# Patient Record
Sex: Female | Born: 1955 | Race: Black or African American | Hispanic: No | Smoking: Never smoker
Health system: Southern US, Community
[De-identification: ages and names within clinical notes are randomized; demographics above are authoritative.]

## PROBLEM LIST (undated history)

## (undated) DIAGNOSIS — IMO0002 Reserved for concepts with insufficient information to code with codable children: Secondary | ICD-10-CM

## (undated) DIAGNOSIS — M1990 Unspecified osteoarthritis, unspecified site: Secondary | ICD-10-CM

## (undated) DIAGNOSIS — M67439 Ganglion, unspecified wrist: Secondary | ICD-10-CM

## (undated) DIAGNOSIS — I10 Essential (primary) hypertension: Secondary | ICD-10-CM

## (undated) HISTORY — DX: Essential (primary) hypertension: I10

## (undated) HISTORY — PX: ABDOMINAL HYSTERECTOMY: SHX81

## (undated) HISTORY — DX: Unspecified osteoarthritis, unspecified site: M19.90

## (undated) HISTORY — DX: Ganglion, unspecified wrist: M67.439

## (undated) HISTORY — DX: Reserved for concepts with insufficient information to code with codable children: IMO0002

---

## 1997-08-14 ENCOUNTER — Emergency Department (HOSPITAL_COMMUNITY): Admission: EM | Admit: 1997-08-14 | Discharge: 1997-08-14 | Payer: Self-pay | Admitting: Emergency Medicine

## 1997-08-21 ENCOUNTER — Emergency Department (HOSPITAL_COMMUNITY): Admission: EM | Admit: 1997-08-21 | Discharge: 1997-08-21 | Payer: Self-pay | Admitting: Emergency Medicine

## 1997-10-05 ENCOUNTER — Encounter: Admission: RE | Admit: 1997-10-05 | Discharge: 1997-10-05 | Payer: Self-pay | Admitting: Family Medicine

## 1998-03-04 ENCOUNTER — Encounter: Admission: RE | Admit: 1998-03-04 | Discharge: 1998-03-04 | Payer: Self-pay | Admitting: Family Medicine

## 1998-03-29 ENCOUNTER — Encounter: Admission: RE | Admit: 1998-03-29 | Discharge: 1998-03-29 | Payer: Self-pay | Admitting: Family Medicine

## 1998-04-12 ENCOUNTER — Encounter: Admission: RE | Admit: 1998-04-12 | Discharge: 1998-04-12 | Payer: Self-pay | Admitting: Family Medicine

## 1998-07-26 ENCOUNTER — Encounter: Admission: RE | Admit: 1998-07-26 | Discharge: 1998-07-26 | Payer: Self-pay | Admitting: Sports Medicine

## 1998-10-25 ENCOUNTER — Encounter: Admission: RE | Admit: 1998-10-25 | Discharge: 1998-10-25 | Payer: Self-pay | Admitting: Family Medicine

## 1999-04-08 ENCOUNTER — Emergency Department (HOSPITAL_COMMUNITY): Admission: EM | Admit: 1999-04-08 | Discharge: 1999-04-08 | Payer: Self-pay | Admitting: Emergency Medicine

## 1999-05-27 ENCOUNTER — Encounter: Admission: RE | Admit: 1999-05-27 | Discharge: 1999-05-27 | Payer: Self-pay | Admitting: Family Medicine

## 1999-06-16 ENCOUNTER — Encounter (INDEPENDENT_AMBULATORY_CARE_PROVIDER_SITE_OTHER): Payer: Self-pay | Admitting: *Deleted

## 1999-06-16 LAB — CONVERTED CEMR LAB

## 1999-06-20 ENCOUNTER — Emergency Department (HOSPITAL_COMMUNITY): Admission: EM | Admit: 1999-06-20 | Discharge: 1999-06-20 | Payer: Self-pay | Admitting: Emergency Medicine

## 1999-06-20 ENCOUNTER — Encounter: Payer: Self-pay | Admitting: Emergency Medicine

## 1999-06-24 ENCOUNTER — Encounter: Admission: RE | Admit: 1999-06-24 | Discharge: 1999-06-24 | Payer: Self-pay | Admitting: Family Medicine

## 1999-07-08 ENCOUNTER — Encounter: Admission: RE | Admit: 1999-07-08 | Discharge: 1999-10-05 | Payer: Self-pay | Admitting: Family Medicine

## 1999-07-22 ENCOUNTER — Encounter: Payer: Self-pay | Admitting: Sports Medicine

## 1999-07-22 ENCOUNTER — Encounter: Admission: RE | Admit: 1999-07-22 | Discharge: 1999-07-22 | Payer: Self-pay | Admitting: Sports Medicine

## 1999-07-29 ENCOUNTER — Other Ambulatory Visit: Admission: RE | Admit: 1999-07-29 | Discharge: 1999-07-29 | Payer: Self-pay | Admitting: Obstetrics and Gynecology

## 1999-07-29 ENCOUNTER — Encounter (INDEPENDENT_AMBULATORY_CARE_PROVIDER_SITE_OTHER): Payer: Self-pay

## 1999-08-12 ENCOUNTER — Encounter: Admission: RE | Admit: 1999-08-12 | Discharge: 1999-08-12 | Payer: Self-pay | Admitting: Family Medicine

## 1999-08-12 ENCOUNTER — Ambulatory Visit (HOSPITAL_COMMUNITY): Admission: RE | Admit: 1999-08-12 | Discharge: 1999-08-12 | Payer: Self-pay | Admitting: Obstetrics and Gynecology

## 1999-08-12 ENCOUNTER — Encounter: Payer: Self-pay | Admitting: Obstetrics and Gynecology

## 1999-08-19 ENCOUNTER — Encounter: Admission: RE | Admit: 1999-08-19 | Discharge: 1999-08-19 | Payer: Self-pay | Admitting: Family Medicine

## 1999-09-05 ENCOUNTER — Inpatient Hospital Stay (HOSPITAL_COMMUNITY): Admission: EM | Admit: 1999-09-05 | Discharge: 1999-09-05 | Payer: Self-pay | Admitting: Obstetrics and Gynecology

## 1999-09-05 ENCOUNTER — Encounter: Admission: RE | Admit: 1999-09-05 | Discharge: 1999-09-05 | Payer: Self-pay | Admitting: Family Medicine

## 1999-10-04 ENCOUNTER — Inpatient Hospital Stay (HOSPITAL_COMMUNITY): Admission: AD | Admit: 1999-10-04 | Discharge: 1999-10-04 | Payer: Self-pay | Admitting: *Deleted

## 1999-10-14 ENCOUNTER — Encounter: Admission: RE | Admit: 1999-10-14 | Discharge: 1999-10-14 | Payer: Self-pay | Admitting: Family Medicine

## 1999-10-31 ENCOUNTER — Encounter: Payer: Self-pay | Admitting: Obstetrics and Gynecology

## 1999-11-03 ENCOUNTER — Inpatient Hospital Stay (HOSPITAL_COMMUNITY): Admission: RE | Admit: 1999-11-03 | Discharge: 1999-11-05 | Payer: Self-pay | Admitting: Obstetrics and Gynecology

## 1999-11-03 ENCOUNTER — Encounter (INDEPENDENT_AMBULATORY_CARE_PROVIDER_SITE_OTHER): Payer: Self-pay | Admitting: Specialist

## 1999-12-30 ENCOUNTER — Encounter: Admission: RE | Admit: 1999-12-30 | Discharge: 1999-12-30 | Payer: Self-pay | Admitting: Family Medicine

## 2000-07-06 ENCOUNTER — Encounter: Admission: RE | Admit: 2000-07-06 | Discharge: 2000-07-06 | Payer: Self-pay | Admitting: Family Medicine

## 2000-07-27 ENCOUNTER — Encounter: Admission: RE | Admit: 2000-07-27 | Discharge: 2000-07-27 | Payer: Self-pay | Admitting: *Deleted

## 2000-07-27 ENCOUNTER — Encounter: Payer: Self-pay | Admitting: *Deleted

## 2000-08-03 ENCOUNTER — Encounter: Admission: RE | Admit: 2000-08-03 | Discharge: 2000-08-03 | Payer: Self-pay | Admitting: *Deleted

## 2000-08-03 ENCOUNTER — Encounter: Payer: Self-pay | Admitting: *Deleted

## 2000-09-19 ENCOUNTER — Emergency Department (HOSPITAL_COMMUNITY): Admission: EM | Admit: 2000-09-19 | Discharge: 2000-09-19 | Payer: Self-pay | Admitting: Emergency Medicine

## 2000-09-21 ENCOUNTER — Encounter: Admission: RE | Admit: 2000-09-21 | Discharge: 2000-09-21 | Payer: Self-pay | Admitting: Family Medicine

## 2000-10-19 ENCOUNTER — Encounter: Admission: RE | Admit: 2000-10-19 | Discharge: 2000-10-19 | Payer: Self-pay | Admitting: Family Medicine

## 2000-12-05 ENCOUNTER — Encounter: Admission: RE | Admit: 2000-12-05 | Discharge: 2000-12-05 | Payer: Self-pay | Admitting: Family Medicine

## 2000-12-20 ENCOUNTER — Other Ambulatory Visit: Admission: RE | Admit: 2000-12-20 | Discharge: 2000-12-20 | Payer: Self-pay | Admitting: Obstetrics and Gynecology

## 2001-01-04 ENCOUNTER — Encounter: Admission: RE | Admit: 2001-01-04 | Discharge: 2001-01-04 | Payer: Self-pay | Admitting: Family Medicine

## 2001-05-11 ENCOUNTER — Emergency Department (HOSPITAL_COMMUNITY): Admission: EM | Admit: 2001-05-11 | Discharge: 2001-05-11 | Payer: Self-pay | Admitting: *Deleted

## 2001-05-31 ENCOUNTER — Encounter: Admission: RE | Admit: 2001-05-31 | Discharge: 2001-05-31 | Payer: Self-pay | Admitting: Family Medicine

## 2001-08-02 ENCOUNTER — Encounter: Admission: RE | Admit: 2001-08-02 | Discharge: 2001-08-02 | Payer: Self-pay | Admitting: *Deleted

## 2001-08-02 ENCOUNTER — Encounter: Payer: Self-pay | Admitting: *Deleted

## 2001-09-23 ENCOUNTER — Encounter: Admission: RE | Admit: 2001-09-23 | Discharge: 2001-09-23 | Payer: Self-pay | Admitting: Family Medicine

## 2001-10-24 ENCOUNTER — Encounter: Admission: RE | Admit: 2001-10-24 | Discharge: 2001-10-24 | Payer: Self-pay | Admitting: Family Medicine

## 2002-02-14 ENCOUNTER — Other Ambulatory Visit: Admission: RE | Admit: 2002-02-14 | Discharge: 2002-02-14 | Payer: Self-pay | Admitting: Obstetrics and Gynecology

## 2002-02-20 ENCOUNTER — Encounter: Admission: RE | Admit: 2002-02-20 | Discharge: 2002-02-20 | Payer: Self-pay | Admitting: Family Medicine

## 2002-04-15 ENCOUNTER — Ambulatory Visit (HOSPITAL_BASED_OUTPATIENT_CLINIC_OR_DEPARTMENT_OTHER): Admission: RE | Admit: 2002-04-15 | Discharge: 2002-04-15 | Payer: Self-pay | Admitting: General Surgery

## 2002-04-15 ENCOUNTER — Encounter (INDEPENDENT_AMBULATORY_CARE_PROVIDER_SITE_OTHER): Payer: Self-pay | Admitting: *Deleted

## 2002-06-18 ENCOUNTER — Encounter: Admission: RE | Admit: 2002-06-18 | Discharge: 2002-06-18 | Payer: Self-pay | Admitting: Family Medicine

## 2002-07-07 ENCOUNTER — Ambulatory Visit (HOSPITAL_COMMUNITY): Admission: RE | Admit: 2002-07-07 | Discharge: 2002-07-07 | Payer: Self-pay | Admitting: Family Medicine

## 2002-07-07 ENCOUNTER — Encounter: Admission: RE | Admit: 2002-07-07 | Discharge: 2002-07-07 | Payer: Self-pay | Admitting: Family Medicine

## 2002-07-18 ENCOUNTER — Encounter: Payer: Self-pay | Admitting: Family Medicine

## 2002-07-18 ENCOUNTER — Encounter: Admission: RE | Admit: 2002-07-18 | Discharge: 2002-07-18 | Payer: Self-pay | Admitting: Family Medicine

## 2002-08-08 ENCOUNTER — Ambulatory Visit (HOSPITAL_COMMUNITY): Admission: RE | Admit: 2002-08-08 | Discharge: 2002-08-08 | Payer: Self-pay | Admitting: Family Medicine

## 2002-08-08 ENCOUNTER — Encounter: Payer: Self-pay | Admitting: Family Medicine

## 2002-09-29 ENCOUNTER — Encounter: Admission: RE | Admit: 2002-09-29 | Discharge: 2002-09-29 | Payer: Self-pay | Admitting: Family Medicine

## 2003-02-16 ENCOUNTER — Encounter: Admission: RE | Admit: 2003-02-16 | Discharge: 2003-02-16 | Payer: Self-pay | Admitting: Family Medicine

## 2003-02-20 ENCOUNTER — Encounter: Admission: RE | Admit: 2003-02-20 | Discharge: 2003-02-20 | Payer: Self-pay | Admitting: Family Medicine

## 2003-03-11 ENCOUNTER — Encounter: Admission: RE | Admit: 2003-03-11 | Discharge: 2003-03-11 | Payer: Self-pay | Admitting: Family Medicine

## 2003-03-27 ENCOUNTER — Other Ambulatory Visit: Admission: RE | Admit: 2003-03-27 | Discharge: 2003-03-27 | Payer: Self-pay | Admitting: Obstetrics and Gynecology

## 2003-07-23 ENCOUNTER — Encounter: Admission: RE | Admit: 2003-07-23 | Discharge: 2003-07-23 | Payer: Self-pay | Admitting: Sports Medicine

## 2003-08-14 ENCOUNTER — Ambulatory Visit (HOSPITAL_COMMUNITY): Admission: RE | Admit: 2003-08-14 | Discharge: 2003-08-14 | Payer: Self-pay | Admitting: Family Medicine

## 2003-08-28 ENCOUNTER — Emergency Department (HOSPITAL_COMMUNITY): Admission: EM | Admit: 2003-08-28 | Discharge: 2003-08-28 | Payer: Self-pay | Admitting: Family Medicine

## 2003-08-28 ENCOUNTER — Ambulatory Visit (HOSPITAL_COMMUNITY): Admission: RE | Admit: 2003-08-28 | Discharge: 2003-08-28 | Payer: Self-pay | Admitting: Family Medicine

## 2003-09-01 ENCOUNTER — Emergency Department (HOSPITAL_COMMUNITY): Admission: EM | Admit: 2003-09-01 | Discharge: 2003-09-01 | Payer: Self-pay | Admitting: Family Medicine

## 2003-10-12 ENCOUNTER — Encounter: Admission: RE | Admit: 2003-10-12 | Discharge: 2003-10-12 | Payer: Self-pay | Admitting: Family Medicine

## 2003-11-10 ENCOUNTER — Encounter: Admission: RE | Admit: 2003-11-10 | Discharge: 2003-11-10 | Payer: Self-pay | Admitting: Family Medicine

## 2003-12-22 ENCOUNTER — Encounter: Admission: RE | Admit: 2003-12-22 | Discharge: 2003-12-22 | Payer: Self-pay | Admitting: Family Medicine

## 2004-04-27 ENCOUNTER — Ambulatory Visit: Payer: Self-pay | Admitting: Family Medicine

## 2004-05-23 ENCOUNTER — Ambulatory Visit: Payer: Self-pay | Admitting: Family Medicine

## 2004-06-23 ENCOUNTER — Emergency Department (HOSPITAL_COMMUNITY): Admission: EM | Admit: 2004-06-23 | Discharge: 2004-06-23 | Payer: Self-pay | Admitting: Family Medicine

## 2004-07-05 ENCOUNTER — Ambulatory Visit: Payer: Self-pay | Admitting: Sports Medicine

## 2004-08-16 ENCOUNTER — Ambulatory Visit: Payer: Self-pay | Admitting: Family Medicine

## 2004-08-22 ENCOUNTER — Ambulatory Visit (HOSPITAL_COMMUNITY): Admission: RE | Admit: 2004-08-22 | Discharge: 2004-08-22 | Payer: Self-pay | Admitting: Family Medicine

## 2004-09-05 ENCOUNTER — Encounter: Admission: RE | Admit: 2004-09-05 | Discharge: 2004-09-05 | Payer: Self-pay | Admitting: Sports Medicine

## 2004-12-13 ENCOUNTER — Ambulatory Visit: Payer: Self-pay | Admitting: Family Medicine

## 2005-01-05 ENCOUNTER — Ambulatory Visit (HOSPITAL_BASED_OUTPATIENT_CLINIC_OR_DEPARTMENT_OTHER): Admission: RE | Admit: 2005-01-05 | Discharge: 2005-01-05 | Payer: Self-pay | Admitting: Family Medicine

## 2005-01-08 ENCOUNTER — Ambulatory Visit: Payer: Self-pay | Admitting: Internal Medicine

## 2005-03-27 ENCOUNTER — Ambulatory Visit: Payer: Self-pay | Admitting: Family Medicine

## 2005-07-06 ENCOUNTER — Emergency Department (HOSPITAL_COMMUNITY): Admission: EM | Admit: 2005-07-06 | Discharge: 2005-07-06 | Payer: Self-pay | Admitting: Family Medicine

## 2005-07-27 ENCOUNTER — Ambulatory Visit: Payer: Self-pay | Admitting: Family Medicine

## 2005-09-07 ENCOUNTER — Ambulatory Visit (HOSPITAL_COMMUNITY): Admission: RE | Admit: 2005-09-07 | Discharge: 2005-09-07 | Payer: Self-pay | Admitting: Sports Medicine

## 2005-09-13 ENCOUNTER — Ambulatory Visit (HOSPITAL_COMMUNITY): Admission: RE | Admit: 2005-09-13 | Discharge: 2005-09-13 | Payer: Self-pay | Admitting: Sports Medicine

## 2005-09-15 ENCOUNTER — Emergency Department (HOSPITAL_COMMUNITY): Admission: EM | Admit: 2005-09-15 | Discharge: 2005-09-15 | Payer: Self-pay | Admitting: Emergency Medicine

## 2005-09-18 ENCOUNTER — Ambulatory Visit: Payer: Self-pay | Admitting: Family Medicine

## 2005-09-27 ENCOUNTER — Ambulatory Visit: Payer: Self-pay | Admitting: Sports Medicine

## 2006-01-18 ENCOUNTER — Ambulatory Visit: Payer: Self-pay | Admitting: Family Medicine

## 2006-02-15 ENCOUNTER — Ambulatory Visit: Payer: Self-pay | Admitting: Family Medicine

## 2006-05-25 ENCOUNTER — Encounter (INDEPENDENT_AMBULATORY_CARE_PROVIDER_SITE_OTHER): Payer: Self-pay | Admitting: Family Medicine

## 2006-07-12 DIAGNOSIS — D649 Anemia, unspecified: Secondary | ICD-10-CM

## 2006-07-12 DIAGNOSIS — E669 Obesity, unspecified: Secondary | ICD-10-CM | POA: Insufficient documentation

## 2006-07-12 DIAGNOSIS — K219 Gastro-esophageal reflux disease without esophagitis: Secondary | ICD-10-CM | POA: Insufficient documentation

## 2006-07-12 DIAGNOSIS — J309 Allergic rhinitis, unspecified: Secondary | ICD-10-CM | POA: Insufficient documentation

## 2006-07-12 DIAGNOSIS — G4700 Insomnia, unspecified: Secondary | ICD-10-CM | POA: Insufficient documentation

## 2006-07-12 DIAGNOSIS — L2089 Other atopic dermatitis: Secondary | ICD-10-CM | POA: Insufficient documentation

## 2006-07-12 DIAGNOSIS — IMO0002 Reserved for concepts with insufficient information to code with codable children: Secondary | ICD-10-CM | POA: Insufficient documentation

## 2006-07-12 DIAGNOSIS — I10 Essential (primary) hypertension: Secondary | ICD-10-CM | POA: Insufficient documentation

## 2006-07-12 DIAGNOSIS — E119 Type 2 diabetes mellitus without complications: Secondary | ICD-10-CM | POA: Insufficient documentation

## 2006-07-12 HISTORY — DX: Anemia, unspecified: D64.9

## 2006-07-12 HISTORY — DX: Reserved for concepts with insufficient information to code with codable children: IMO0002

## 2006-07-13 ENCOUNTER — Encounter (INDEPENDENT_AMBULATORY_CARE_PROVIDER_SITE_OTHER): Payer: Self-pay | Admitting: *Deleted

## 2006-08-13 ENCOUNTER — Emergency Department (HOSPITAL_COMMUNITY): Admission: EM | Admit: 2006-08-13 | Discharge: 2006-08-13 | Payer: Self-pay | Admitting: Emergency Medicine

## 2006-08-15 ENCOUNTER — Ambulatory Visit (HOSPITAL_COMMUNITY): Admission: RE | Admit: 2006-08-15 | Discharge: 2006-08-15 | Payer: Self-pay | Admitting: Obstetrics and Gynecology

## 2006-09-18 ENCOUNTER — Ambulatory Visit (HOSPITAL_COMMUNITY): Admission: RE | Admit: 2006-09-18 | Discharge: 2006-09-18 | Payer: Self-pay | Admitting: Internal Medicine

## 2006-09-21 ENCOUNTER — Ambulatory Visit: Payer: Self-pay | Admitting: Sports Medicine

## 2006-09-21 ENCOUNTER — Encounter (INDEPENDENT_AMBULATORY_CARE_PROVIDER_SITE_OTHER): Payer: Self-pay | Admitting: Family Medicine

## 2006-09-21 LAB — CONVERTED CEMR LAB
BUN: 35 mg/dL — ABNORMAL HIGH (ref 6–23)
CO2: 26 meq/L (ref 19–32)
Calcium: 9.9 mg/dL (ref 8.4–10.5)
Chloride: 100 meq/L (ref 96–112)
Creatinine, Ser: 1.06 mg/dL (ref 0.40–1.20)
Glucose, Bld: 148 mg/dL — ABNORMAL HIGH (ref 70–99)
Hgb A1c MFr Bld: 8.1 %
Potassium: 4 meq/L (ref 3.5–5.3)
Sodium: 136 meq/L (ref 135–145)

## 2006-10-25 ENCOUNTER — Encounter (INDEPENDENT_AMBULATORY_CARE_PROVIDER_SITE_OTHER): Payer: Self-pay | Admitting: Family Medicine

## 2006-11-17 ENCOUNTER — Emergency Department (HOSPITAL_COMMUNITY): Admission: EM | Admit: 2006-11-17 | Discharge: 2006-11-17 | Payer: Self-pay | Admitting: Emergency Medicine

## 2006-12-28 ENCOUNTER — Ambulatory Visit: Payer: Self-pay | Admitting: Family Medicine

## 2006-12-28 ENCOUNTER — Encounter (INDEPENDENT_AMBULATORY_CARE_PROVIDER_SITE_OTHER): Payer: Self-pay | Admitting: Family Medicine

## 2006-12-28 DIAGNOSIS — E1169 Type 2 diabetes mellitus with other specified complication: Secondary | ICD-10-CM | POA: Insufficient documentation

## 2006-12-28 DIAGNOSIS — E785 Hyperlipidemia, unspecified: Secondary | ICD-10-CM

## 2006-12-28 DIAGNOSIS — G5600 Carpal tunnel syndrome, unspecified upper limb: Secondary | ICD-10-CM | POA: Insufficient documentation

## 2006-12-28 DIAGNOSIS — E782 Mixed hyperlipidemia: Secondary | ICD-10-CM | POA: Insufficient documentation

## 2006-12-28 LAB — CONVERTED CEMR LAB
ALT: 18 units/L (ref 0–35)
AST: 23 units/L (ref 0–37)
Albumin: 4.5 g/dL (ref 3.5–5.2)
Alkaline Phosphatase: 92 units/L (ref 39–117)
BUN: 14 mg/dL (ref 6–23)
CO2: 26 meq/L (ref 19–32)
Calcium: 10.1 mg/dL (ref 8.4–10.5)
Chloride: 105 meq/L (ref 96–112)
Cholesterol: 181 mg/dL (ref 0–200)
Creatinine, Ser: 0.82 mg/dL (ref 0.40–1.20)
Glucose, Bld: 117 mg/dL — ABNORMAL HIGH (ref 70–99)
HDL: 69 mg/dL (ref >39)
Hgb A1c MFr Bld: 7.2 %
LDL Cholesterol: 99 mg/dL (ref 0–99)
Potassium: 4 meq/L (ref 3.5–5.3)
Sodium: 142 meq/L (ref 135–145)
Total Bilirubin: 0.5 mg/dL (ref 0.3–1.2)
Total CHOL/HDL Ratio: 2.6
Total Protein: 7.5 g/dL (ref 6.0–8.3)
Triglycerides: 63 mg/dL (ref <150)
VLDL: 13 mg/dL (ref 0–40)

## 2006-12-31 ENCOUNTER — Encounter (INDEPENDENT_AMBULATORY_CARE_PROVIDER_SITE_OTHER): Payer: Self-pay | Admitting: Family Medicine

## 2007-03-20 ENCOUNTER — Encounter (INDEPENDENT_AMBULATORY_CARE_PROVIDER_SITE_OTHER): Payer: Self-pay | Admitting: Family Medicine

## 2007-03-20 ENCOUNTER — Ambulatory Visit: Payer: Self-pay | Admitting: Family Medicine

## 2007-03-20 ENCOUNTER — Encounter: Payer: Self-pay | Admitting: *Deleted

## 2007-03-27 ENCOUNTER — Encounter (INDEPENDENT_AMBULATORY_CARE_PROVIDER_SITE_OTHER): Payer: Self-pay | Admitting: Family Medicine

## 2007-03-27 ENCOUNTER — Ambulatory Visit: Payer: Self-pay | Admitting: Family Medicine

## 2007-03-27 LAB — CONVERTED CEMR LAB: Hgb A1c MFr Bld: 11.2 %

## 2007-03-28 LAB — CONVERTED CEMR LAB
BUN: 22 mg/dL (ref 6–23)
CO2: 22 meq/L (ref 19–32)
Calcium: 10.3 mg/dL (ref 8.4–10.5)
Chloride: 97 meq/L (ref 96–112)
Creatinine, Ser: 1.37 mg/dL — ABNORMAL HIGH (ref 0.40–1.20)
Glucose, Bld: 154 mg/dL — ABNORMAL HIGH (ref 70–99)
Potassium: 5 meq/L (ref 3.5–5.3)
Sodium: 133 meq/L — ABNORMAL LOW (ref 135–145)

## 2007-04-22 ENCOUNTER — Ambulatory Visit: Payer: Self-pay | Admitting: Family Medicine

## 2007-04-26 ENCOUNTER — Encounter (INDEPENDENT_AMBULATORY_CARE_PROVIDER_SITE_OTHER): Payer: Self-pay | Admitting: Family Medicine

## 2007-04-26 ENCOUNTER — Ambulatory Visit: Payer: Self-pay | Admitting: Family Medicine

## 2007-04-26 ENCOUNTER — Encounter: Payer: Self-pay | Admitting: *Deleted

## 2007-04-29 LAB — CONVERTED CEMR LAB
BUN: 31 mg/dL — ABNORMAL HIGH (ref 6–23)
CO2: 22 meq/L (ref 19–32)
Calcium: 9.1 mg/dL (ref 8.4–10.5)
Chloride: 100 meq/L (ref 96–112)
Creatinine, Ser: 1.78 mg/dL — ABNORMAL HIGH (ref 0.40–1.20)
Glucose, Bld: 164 mg/dL — ABNORMAL HIGH (ref 70–99)
Potassium: 4.5 meq/L (ref 3.5–5.3)
Sodium: 134 meq/L — ABNORMAL LOW (ref 135–145)

## 2007-05-03 ENCOUNTER — Ambulatory Visit: Payer: Self-pay | Admitting: Family Medicine

## 2007-05-03 ENCOUNTER — Encounter (INDEPENDENT_AMBULATORY_CARE_PROVIDER_SITE_OTHER): Payer: Self-pay | Admitting: Family Medicine

## 2007-05-06 LAB — CONVERTED CEMR LAB
BUN: 17 mg/dL (ref 6–23)
CO2: 21 meq/L (ref 19–32)
Calcium: 10 mg/dL (ref 8.4–10.5)
Chloride: 99 meq/L (ref 96–112)
Creatinine, Ser: 0.99 mg/dL (ref 0.40–1.20)
Glucose, Bld: 229 mg/dL — ABNORMAL HIGH (ref 70–99)
Potassium: 4.4 meq/L (ref 3.5–5.3)
Sodium: 134 meq/L — ABNORMAL LOW (ref 135–145)

## 2007-05-16 LAB — CONVERTED CEMR LAB
Pap Smear: NORMAL
Pap Smear: NORMAL
Pap Smear: NORMAL
Pap Smear: NORMAL
Pap Smear: NORMAL

## 2007-06-05 ENCOUNTER — Ambulatory Visit: Payer: Self-pay | Admitting: Family Medicine

## 2007-06-05 LAB — CONVERTED CEMR LAB: Hgb A1c MFr Bld: 9.1 %

## 2007-09-19 ENCOUNTER — Ambulatory Visit (HOSPITAL_COMMUNITY): Admission: RE | Admit: 2007-09-19 | Discharge: 2007-09-19 | Payer: Self-pay | Admitting: Family Medicine

## 2007-12-09 ENCOUNTER — Ambulatory Visit: Payer: Self-pay | Admitting: Family Medicine

## 2007-12-09 ENCOUNTER — Encounter (INDEPENDENT_AMBULATORY_CARE_PROVIDER_SITE_OTHER): Payer: Self-pay | Admitting: Family Medicine

## 2007-12-09 LAB — CONVERTED CEMR LAB
ALT: 16 units/L (ref 0–35)
AST: 15 units/L (ref 0–37)
Albumin: 4.4 g/dL (ref 3.5–5.2)
Alkaline Phosphatase: 90 units/L (ref 39–117)
BUN: 19 mg/dL (ref 6–23)
CO2: 24 meq/L (ref 19–32)
Calcium: 9.3 mg/dL (ref 8.4–10.5)
Chloride: 101 meq/L (ref 96–112)
Creatinine, Ser: 0.8 mg/dL (ref 0.40–1.20)
Glucose, Bld: 269 mg/dL — ABNORMAL HIGH (ref 70–99)
Hgb A1c MFr Bld: 9 %
Potassium: 4.3 meq/L (ref 3.5–5.3)
Sodium: 137 meq/L (ref 135–145)
TSH: 0.669 microintl units/mL (ref 0.350–4.50)
Total Bilirubin: 0.4 mg/dL (ref 0.3–1.2)
Total Protein: 7.5 g/dL (ref 6.0–8.3)

## 2007-12-19 ENCOUNTER — Encounter: Payer: Self-pay | Admitting: *Deleted

## 2008-03-03 ENCOUNTER — Ambulatory Visit (HOSPITAL_COMMUNITY): Admission: RE | Admit: 2008-03-03 | Discharge: 2008-03-03 | Payer: Self-pay | Admitting: Family Medicine

## 2008-03-03 ENCOUNTER — Ambulatory Visit: Payer: Self-pay | Admitting: Family Medicine

## 2008-03-03 ENCOUNTER — Encounter (INDEPENDENT_AMBULATORY_CARE_PROVIDER_SITE_OTHER): Payer: Self-pay | Admitting: Family Medicine

## 2008-03-03 DIAGNOSIS — M25519 Pain in unspecified shoulder: Secondary | ICD-10-CM | POA: Insufficient documentation

## 2008-03-03 LAB — CONVERTED CEMR LAB
ALT: 12 units/L (ref 0–35)
AST: 13 units/L (ref 0–37)
Albumin: 4.2 g/dL (ref 3.5–5.2)
Alkaline Phosphatase: 88 units/L (ref 39–117)
BUN: 22 mg/dL (ref 6–23)
CO2: 23 meq/L (ref 19–32)
Calcium: 9.3 mg/dL (ref 8.4–10.5)
Chloride: 103 meq/L (ref 96–112)
Cholesterol: 175 mg/dL (ref 0–200)
Creatinine, Ser: 0.78 mg/dL (ref 0.40–1.20)
Glucose, Bld: 274 mg/dL — ABNORMAL HIGH (ref 70–99)
HDL: 66 mg/dL (ref >39)
Hgb A1c MFr Bld: 10.3 %
LDL Cholesterol: 92 mg/dL (ref 0–99)
Microalbumin U total vol: NEGATIVE mg/L
Potassium: 4.1 meq/L (ref 3.5–5.3)
Sodium: 138 meq/L (ref 135–145)
Total Bilirubin: 0.4 mg/dL (ref 0.3–1.2)
Total CHOL/HDL Ratio: 2.7
Total Protein: 7.2 g/dL (ref 6.0–8.3)
Triglycerides: 84 mg/dL (ref <150)
VLDL: 17 mg/dL (ref 0–40)

## 2008-03-04 ENCOUNTER — Encounter (INDEPENDENT_AMBULATORY_CARE_PROVIDER_SITE_OTHER): Payer: Self-pay | Admitting: Family Medicine

## 2008-03-05 ENCOUNTER — Encounter (INDEPENDENT_AMBULATORY_CARE_PROVIDER_SITE_OTHER): Payer: Self-pay | Admitting: Family Medicine

## 2008-04-13 ENCOUNTER — Encounter (INDEPENDENT_AMBULATORY_CARE_PROVIDER_SITE_OTHER): Payer: Self-pay | Admitting: Family Medicine

## 2008-05-28 ENCOUNTER — Encounter (INDEPENDENT_AMBULATORY_CARE_PROVIDER_SITE_OTHER): Payer: Self-pay | Admitting: Family Medicine

## 2008-05-28 LAB — CONVERTED CEMR LAB
ALT: 16 units/L
AST: 18 units/L
Albumin: 4.5 g/dL
BUN: 15 mg/dL
Blood Glucose, Fasting: 232 mg/dL
CO2, serum: 27 mmol/L
Calcium: 9.5 mg/dL
Cholesterol: 173 mg/dL
Creatinine, Ser: 0.82 mg/dL
HCT: 38.2 %
HDL: 71 mg/dL
Hemoglobin: 12.3 g/dL
LDL Cholesterol: 91 mg/dL
MCV: 78 fL
Potassium, serum: 4.6 mmol/L
Sodium, serum: 139 mmol/L
Total Bilirubin: 0.3 mg/dL
Triglycerides: 53 mg/dL
WBC, blood: 3.8 10*3/uL
platelet count: 221 10*3/uL

## 2008-06-09 ENCOUNTER — Ambulatory Visit: Payer: Self-pay | Admitting: Family Medicine

## 2008-06-09 LAB — CONVERTED CEMR LAB: Hgb A1c MFr Bld: 13.1 %

## 2008-06-10 ENCOUNTER — Encounter (INDEPENDENT_AMBULATORY_CARE_PROVIDER_SITE_OTHER): Payer: Self-pay | Admitting: Family Medicine

## 2008-06-15 LAB — CONVERTED CEMR LAB: Pap Smear: NORMAL

## 2008-08-05 ENCOUNTER — Ambulatory Visit: Payer: Self-pay | Admitting: Family Medicine

## 2008-08-05 ENCOUNTER — Encounter: Admission: RE | Admit: 2008-08-05 | Discharge: 2008-08-05 | Payer: Self-pay | Admitting: Family Medicine

## 2008-09-09 ENCOUNTER — Ambulatory Visit: Payer: Self-pay | Admitting: Family Medicine

## 2008-09-09 LAB — CONVERTED CEMR LAB: Hgb A1c MFr Bld: 10.5 %

## 2008-09-21 ENCOUNTER — Ambulatory Visit (HOSPITAL_COMMUNITY): Admission: RE | Admit: 2008-09-21 | Discharge: 2008-09-21 | Payer: Self-pay | Admitting: Family Medicine

## 2008-09-23 ENCOUNTER — Telehealth (INDEPENDENT_AMBULATORY_CARE_PROVIDER_SITE_OTHER): Payer: Self-pay | Admitting: Family Medicine

## 2008-10-15 ENCOUNTER — Ambulatory Visit: Payer: Self-pay | Admitting: Family Medicine

## 2008-12-28 ENCOUNTER — Ambulatory Visit: Payer: Self-pay | Admitting: Family Medicine

## 2008-12-28 LAB — CONVERTED CEMR LAB: Hgb A1c MFr Bld: 8.8 %

## 2009-02-22 ENCOUNTER — Ambulatory Visit: Payer: Self-pay | Admitting: Family Medicine

## 2009-03-04 ENCOUNTER — Encounter: Payer: Self-pay | Admitting: Family Medicine

## 2009-04-02 ENCOUNTER — Encounter: Admission: RE | Admit: 2009-04-02 | Discharge: 2009-04-02 | Payer: Self-pay | Admitting: Family Medicine

## 2009-04-02 ENCOUNTER — Encounter: Payer: Self-pay | Admitting: Family Medicine

## 2009-04-02 ENCOUNTER — Ambulatory Visit: Payer: Self-pay | Admitting: Family Medicine

## 2009-04-02 DIAGNOSIS — M542 Cervicalgia: Secondary | ICD-10-CM | POA: Insufficient documentation

## 2009-04-02 LAB — CONVERTED CEMR LAB
HCT: 38.7 % (ref 36.0–46.0)
Hemoglobin: 11.6 g/dL — ABNORMAL LOW (ref 12.0–15.0)
MCHC: 30 g/dL (ref 30.0–36.0)
MCV: 83.2 fL (ref 78.0–100.0)
Platelets: 297 10*3/uL (ref 150–400)
RBC: 4.65 M/uL (ref 3.87–5.11)
RDW: 14.6 % (ref 11.5–15.5)
WBC: 4.5 10*3/uL (ref 4.0–10.5)

## 2009-04-04 ENCOUNTER — Encounter: Payer: Self-pay | Admitting: Family Medicine

## 2009-04-22 ENCOUNTER — Encounter: Payer: Self-pay | Admitting: Family Medicine

## 2009-05-26 ENCOUNTER — Telehealth: Payer: Self-pay | Admitting: *Deleted

## 2009-06-03 ENCOUNTER — Encounter: Payer: Self-pay | Admitting: Family Medicine

## 2009-06-14 ENCOUNTER — Encounter: Admission: RE | Admit: 2009-06-14 | Discharge: 2009-07-08 | Payer: Self-pay | Admitting: Family Medicine

## 2009-06-15 ENCOUNTER — Encounter: Payer: Self-pay | Admitting: Family Medicine

## 2009-07-08 ENCOUNTER — Encounter: Payer: Self-pay | Admitting: Family Medicine

## 2009-09-12 HISTORY — PX: BREAST EXCISIONAL BIOPSY: SUR124

## 2009-09-23 ENCOUNTER — Ambulatory Visit (HOSPITAL_COMMUNITY): Admission: RE | Admit: 2009-09-23 | Discharge: 2009-09-23 | Payer: Self-pay | Admitting: Family Medicine

## 2009-09-27 ENCOUNTER — Encounter: Payer: Self-pay | Admitting: Family Medicine

## 2009-09-28 ENCOUNTER — Encounter: Admission: RE | Admit: 2009-09-28 | Discharge: 2009-09-28 | Payer: Self-pay | Admitting: Family Medicine

## 2009-09-29 ENCOUNTER — Encounter: Admission: RE | Admit: 2009-09-29 | Discharge: 2009-09-29 | Payer: Self-pay | Admitting: Family Medicine

## 2009-10-21 ENCOUNTER — Encounter: Admission: RE | Admit: 2009-10-21 | Discharge: 2009-10-21 | Payer: Self-pay | Admitting: Unknown Physician Specialty

## 2009-10-27 ENCOUNTER — Encounter: Payer: Self-pay | Admitting: Family Medicine

## 2009-10-27 ENCOUNTER — Encounter: Admission: RE | Admit: 2009-10-27 | Discharge: 2009-10-27 | Payer: Self-pay | Admitting: General Surgery

## 2009-10-27 ENCOUNTER — Other Ambulatory Visit: Admission: RE | Admit: 2009-10-27 | Discharge: 2009-10-27 | Payer: Self-pay | Admitting: General Surgery

## 2009-10-29 ENCOUNTER — Ambulatory Visit: Payer: Self-pay | Admitting: Family Medicine

## 2009-10-29 LAB — CONVERTED CEMR LAB
Albumin/Creatinine Ratio, Urine, POC: <30
Creatinine,U: 50 mg/dL
Hemoglobin: 11.5 g/dL
Hgb A1c MFr Bld: 7.7 %
Microalb Creat Ratio: 20 mg/g
Microalbumin U total vol: 10 mg/L

## 2009-11-11 ENCOUNTER — Encounter: Payer: Self-pay | Admitting: Family Medicine

## 2009-11-11 LAB — CONVERTED CEMR LAB
ALT: 15 units/L
AST: 17 units/L
Albumin: 4.3 g/dL
Alkaline Phosphatase: 100 units/L
Anion Gap: 9.6
BUN: 23 mg/dL
CO2: 27 meq/L
Calcium: 9.6 mg/dL
Chloride: 100 meq/L
Cholesterol: 177 mg/dL
Creatinine, Ser: 0.84 mg/dL
GFR calc Af Amer: 113 mL/min
HCT: 35 %
HDL: 69 mg/dL
Hemoglobin: 11.2 g/dL
LDL Cholesterol: 93 mg/dL
MCV: 78 fL
Platelets: 313 10*3/uL
Potassium: 5 meq/L
RDW: 15.6 %
Sodium: 140 meq/L
TSH: 1.3 microintl units/mL
Total Bilirubin: 0.3 mg/dL
Total Protein: 7.6 g/dL
Triglycerides: 73 mg/dL
VLDL: 15 mg/dL
WBC: 4.3 10*3/uL

## 2009-11-18 ENCOUNTER — Ambulatory Visit: Payer: Self-pay | Admitting: Oncology

## 2010-01-14 ENCOUNTER — Ambulatory Visit: Payer: Self-pay | Admitting: Oncology

## 2010-01-28 ENCOUNTER — Ambulatory Visit: Payer: Self-pay | Admitting: Family Medicine

## 2010-01-28 LAB — CONVERTED CEMR LAB: Hgb A1c MFr Bld: 7.8 %

## 2010-01-31 ENCOUNTER — Encounter: Payer: Self-pay | Admitting: Family Medicine

## 2010-02-01 ENCOUNTER — Telehealth: Payer: Self-pay | Admitting: Family Medicine

## 2010-02-15 ENCOUNTER — Telehealth: Payer: Self-pay | Admitting: Family Medicine

## 2010-03-31 ENCOUNTER — Ambulatory Visit: Payer: Self-pay | Admitting: Oncology

## 2010-04-04 ENCOUNTER — Encounter: Payer: Self-pay | Admitting: Family Medicine

## 2010-04-04 LAB — COMPREHENSIVE METABOLIC PANEL
ALT: 12 U/L (ref 0–35)
AST: 17 U/L (ref 0–37)
Albumin: 3.8 g/dL (ref 3.5–5.2)
Alkaline Phosphatase: 51 U/L (ref 39–117)
BUN: 16 mg/dL (ref 6–23)
CO2: 27 mEq/L (ref 19–32)
Calcium: 9.1 mg/dL (ref 8.4–10.5)
Chloride: 105 mEq/L (ref 96–112)
Creatinine, Ser: 1.01 mg/dL (ref 0.40–1.20)
Glucose, Bld: 210 mg/dL — ABNORMAL HIGH (ref 70–99)
Potassium: 4.1 mEq/L (ref 3.5–5.3)
Sodium: 141 mEq/L (ref 135–145)
Total Bilirubin: 0.3 mg/dL (ref 0.3–1.2)
Total Protein: 6.6 g/dL (ref 6.0–8.3)

## 2010-04-04 LAB — CBC WITH DIFFERENTIAL/PLATELET
BASO%: 0.3 % (ref 0.0–2.0)
Basophils Absolute: 0 10*3/uL (ref 0.0–0.1)
EOS%: 4.6 % (ref 0.0–7.0)
Eosinophils Absolute: 0.2 10*3/uL (ref 0.0–0.5)
HCT: 34.4 % — ABNORMAL LOW (ref 34.8–46.6)
HGB: 11 g/dL — ABNORMAL LOW (ref 11.6–15.9)
LYMPH%: 38.7 % (ref 14.0–49.7)
MCH: 24.8 pg — ABNORMAL LOW (ref 25.1–34.0)
MCHC: 32 g/dL (ref 31.5–36.0)
MCV: 77.5 fL — ABNORMAL LOW (ref 79.5–101.0)
MONO#: 0.3 10*3/uL (ref 0.1–0.9)
MONO%: 7.5 % (ref 0.0–14.0)
NEUT#: 1.9 10*3/uL (ref 1.5–6.5)
NEUT%: 48.9 % (ref 38.4–76.8)
Platelets: 248 10*3/uL (ref 145–400)
RBC: 4.44 10*6/uL (ref 3.70–5.45)
RDW: 15.6 % — ABNORMAL HIGH (ref 11.2–14.5)
WBC: 3.9 10*3/uL (ref 3.9–10.3)
lymph#: 1.5 10*3/uL (ref 0.9–3.3)
nRBC: 0 % (ref 0–0)

## 2010-04-14 LAB — CONVERTED CEMR LAB
ALT: 13 units/L
AST: 17 units/L
Albumin: 4 g/dL
Alkaline Phosphatase: 61 units/L
BUN: 17 mg/dL
Calcium: 9 mg/dL
Chloride: 101 meq/L
Creatinine, Ser: 0.88 mg/dL
GFR calc Af Amer: 86 mL/min
GFR calc non Af Amer: 75 mL/min
Glucose, Bld: 107 mg/dL
HCT: 35.1 %
Hemoglobin: 11.3 g/dL
Hgb A1c MFr Bld: 7.6 %
Platelets: 258 10*3/uL
Potassium: 4.2 meq/L
Sodium: 135 meq/L
Total Bilirubin: 0.2 mg/dL
Total Protein: 7.1 g/dL
Vit D, 25-Hydroxy: 27.7 ng/mL
WBC: 3.8 10*3/uL

## 2010-04-28 ENCOUNTER — Ambulatory Visit: Payer: Self-pay | Admitting: Family Medicine

## 2010-04-28 DIAGNOSIS — E559 Vitamin D deficiency, unspecified: Secondary | ICD-10-CM | POA: Insufficient documentation

## 2010-04-28 LAB — CONVERTED CEMR LAB: Hgb A1c MFr Bld: 7.3 %

## 2010-06-14 NOTE — Letter (Signed)
Summary: Results Follow-up Letter  [facility]  [address]   [place], [place] [address]   Phone: [phone]  Fax: [phone]    04/04/2009  [address] [place], [place]  [address]  Dear Ms. MILLER-KANE,   Your X-ray shows "degenerative disk disease of C5-C6."  This means a disk between the bones in your spine in your neck may be worn and inflamed, causing you pain.  Continue your current treatment and I will set up physical therapy for you to help with your pain.  If you have any questions, please don't hesitate to call.  Sincerely,  Delbert Harness MD [facility]           Appended Document: Results Follow-up Letter mailed.

## 2010-06-14 NOTE — Assessment & Plan Note (Signed)
Summary: dm fu wp   Vital Signs:  Patient Profile:   55 Years Old Female Height:     66 inches Weight:      206.3 pounds BMI:     33.42 Temp:     98.2 degrees F oral Pulse rate:   87 / minute BP sitting:   132 / 79  (right arm) Cuff size:   regular  Pt. in pain?   no  Vitals Entered By: Dedra Skeens CMA, (June 09, 2008 9:22 AM)                  PCP:  [name] MD  Chief Complaint:  f/u dm, shoulder pain, obesity, HTN, and HLD.  History of Present Illness: 55 yo F here for f/u of DM and Shoulder Pain as well as MMP.  1.  DM - Taking medications without problems.  Checking home blood sugars - reports conintue to be elevated in 200's.  Has only titrated her Lantus up to 20 units every morning, no further.  Denies running out of medications at any point.  Denies dizziness, shakiness, confusion, polyuria, polydipsia, or nocturia.  Denies recent infection or complications.  Had stopped exercising for a while - just started back.  2.  Obesity - Hasn't been exercising regularly - just started back.   24 hour recall: Awoke 6:30 am Breakfast 6:30 am - Diet Snapple Energy Drink + 1/2 Carrot Water to drink Lunch 12:00 pm - 2 pieces baked chicken, cabbage, macaroni & cheese, low calorie jello moose (sugar free), Diet Snapple Tea Snack 3:00 pm - Apple, water Dinner 5:30 pm - [place] Breast Sandwich with mayo and tomato (2 slices white wheat bread) Exercise class x 1 hr - situps, pushups, circuit traning  8 pm - Chicken Noodle Soup (1 can Campbell's), water 9:30 pm - 1 slice of [place] breast  2.  Left Shoulder Pain - Improving somewhat with Flexeril, Aleve and Shoulder/Neck exercises.  Location:  Now Bilateral (previously just on left); thinks due to stress and mm. tension Onset:  Several months ago Course:  Persistant Warmth, Swelling, or Redness:  No Fever or Chills:  No Weakness or Numbness:  No Hx of injury:  No, but does lots of heavy lifting of boxes at  work Aggravating factors:  Heavy lifting Alleviating factors:  Heat, Aleve, Shoulder/Neck Exercises Prior Imaging:  None Prior Surgery:  None  3.  HTN - Tolerating Medications without problems.  Denies CP, SOB, edema, HA, light-headeness.     4.  HLD - Taking medications without problems.  Denies muscle aches or RUQ pain.   Brings labs done at work.    Updated Prior Medication List: ASPIRIN EC 81 MG TBEC (ASPIRIN) Take 1 tablet by mouth once a day LANTUS 100 UNIT/ML SOLN (INSULIN GLARGINE) 15 units subcutaneously once a day METFORMIN HCL 1000 MG TABS (METFORMIN HCL) Take 1 tablet by mouth two times a day HYZAAR 100-25 MG TABS (LOSARTAN POTASSIUM-HCTZ) Take 1 tablet by mouth once a day LOVASTATIN 40 MG  TABS (LOVASTATIN) 1 tablet at bedtime FLEXERIL 10 MG TABS (CYCLOBENZAPRINE HCL) 1/2 to 1 tablet by mouth at bedtime as needed for shoulder pain - do not drive or drink alcohol while taking  Current Allergies (reviewed today): ! ACE INHIBITORS ! 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 (ICD-280.9)       INSOMNIA NOS (ICD-780.52)       CARPAL TUNNEL SYNDROME, BILATERAL (ICD-354.0)       RHINITIS, ALLERGIC (ICD-477.9)       ECZEMA, ATOPIC DERMATITIS (ICD-691.8)       Hx of KNEE MENISCUS INJURY, UNSPECIFIED  - Rt knee 6/02 (ICD-836.2)       Hx of GONNORHEA 1999           Family History:    Reviewed history from 12/09/2007 and no changes required:       2 Sisters-Both have DM       Father-CVA       Mom died age 27-Heart Disease  Social History:    Single, nonsmoker; grandson - Warehouse manager; works at [employer] - [employer]  department - lifts heavy boxes;  occasional etoh, no cigs, no drug use.  Exercises 4 times per week - in programs.      Review of Systems      See HPI   Physical Exam  General:     Overweight-appearing, no acute distress.  Alert and oriented x 3.  Lungs:     Clear to auscultation bilaterally.  Normal work of breathing.  No wheezes, rales, or rhonchi.  Heart:     Regular rate and rhythm.  No murmur, rub, or gallop.  2+ Dorsalis Pedis pulses.  Msk:     Diffuse muscle tightness and soreness in neck / shoulders.  Extremities:     No clubbing, cyanosis, or edema.   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  Failed to f/u in 1 month as advised.  Stressed urgent need for taking treatment more seriously as pt is in range to need hospitalizatoin.  Discussed complications as well.  Advised to increase Lantus to 26 units tomorrow morning, then by 2 units each day for am fasting CBG of > 120.  Pt able to repeat this back to me and give correct dosages for different scenarios.  Stop at 50 units.  F/u in 1 month and bring meter.  Also asked to keep food and blood sugar log between now and then.  Her updated medication list for this problem includes:    Aspirin Ec 81 Mg Tbec (Aspirin) .Marland Kitchen... Take 1 tablet by mouth once a day    Lantus 100 Unit/ml Soln (Insulin  glargine) .Marland KitchenMarland KitchenMarland KitchenMarland Kitchen 15 units subcutaneously once a day    Metformin Hcl 1000 Mg Tabs (Metformin hcl) .Marland Kitchen... Take 1 tablet by mouth two times a day    Hyzaar 100-25 Mg Tabs (Losartan potassium-hctz) .Marland Kitchen... Take 1 tablet by mouth once a day Orders: A1C-[facility] (03474) [facility]- Est  Level 4 ([address]) Labs Reviewed: HgBA1c: 13.1 (06/09/2008)   Creat: 0.78 (03/03/2008)   Microalbumin: negative (03/03/2008) Last Eye Exam: normal (10/18/2007)   Problem # 2:  OBESITY, NOS (ICD-278.00) Assessment: Deteriorated Continues to gradually gain weight.  Had stopped exercising.  Just started back to regular program.  Advised of importance in helping control diabetes as it is getting to range that could cause the patient to need hospitalization.  Pt to keep food diary for at lesat 1 week between now and f/u in 1 month. Orders: [facility]- Est  Level 4 ([address]756)   Problem # 3:  SHOULDER PAIN, LEFT (ICD-719.41) Assessment: Improved Seems MSK and likely due to muscle tension at this point.  Continue Exercises and Flexeril as needed. Her updated medication list for this problem includes:    Aspirin Ec 81 Mg Tbec (Aspirin) .Marland Kitchen... Take 1 tablet by mouth once a day    Flexeril 10 Mg Tabs (Cyclobenzaprine hcl) .Marland Kitchen... 1/2 to 1 tablet by mouth at bedtime as needed for shoulder pain - do not drive or drink alcohol while taking  Orders: [facility]- Est  Level 4 ([address])   Problem # 4:  HYPERTENSION, BENIGN SYSTEMIC (ICD-401.1) Assessment: Unchanged Continue current medication(s).   Her updated medication list for this problem includes:    Hyzaar 100-25 Mg Tabs (Losartan potassium-hctz) .Marland Kitchen... Take 1 tablet by mouth once a day BP today: 132/79 Prior BP: 138/89 (03/03/2008) Labs Reviewed: Creat: 0.78 (03/03/2008) Chol: 175 (03/03/2008)   HDL: 66 (03/03/2008)   LDL: 92 (03/03/2008)   TG: 84 (03/03/2008)  Orders: [facility]- Est  Level 4 ([address])   Problem # 5:  HYPERLIPIDEMIA NEC/NOS (ICD-272.4) Assessment: Unchanged Continue current  medication(s).   Her updated medication list for this problem includes:    Lovastatin 40 Mg Tabs (Lovastatin) .Marland Kitchen... 1 tablet at bedtime Labs Reviewed: Chol: 175 (03/03/2008)   HDL: 66 (03/03/2008)   LDL: 92 (03/03/2008)   TG: 84 (03/03/2008) SGOT: 13 (03/03/2008)   SGPT: 12 (03/03/2008)  Orders: [facility]- Est  Level 4 ([address])   Complete Medication List: 1)  Aspirin Ec 81 Mg Tbec (Aspirin) .... Take 1 tablet by mouth once a day 2)  Lantus 100 Unit/ml Soln (Insulin glargine) .Marland Kitchen.. 15 units subcutaneously once a day 3)  Metformin Hcl 1000 Mg Tabs (Metformin hcl) .... Take 1 tablet by mouth two times a day 4)  Hyzaar 100-25 Mg Tabs (Losartan potassium-hctz) .... Take 1 tablet by mouth once a day 5)  Lovastatin 40 Mg Tabs (Lovastatin) .Marland Kitchen.. 1 tablet at bedtime 6)  Flexeril 10 Mg Tabs (Cyclobenzaprine hcl) .... 1/2 to 1 tablet by mouth at bedtime as needed for shoulder pain - do not drive or drink alcohol while taking   Patient Instructions: 1)  Please schedule a follow-up appointment in 1 month for your Diabetes. 2)  Take Lantus 26 units tomorrow morning.  Increase your dose by 2 units every morning if your sugar is > 120.  Once you get to 50 units stop until  our next visit. 3)  Keep a record of your sugars each morning and the dose of Lantus and what you eat for a one week period.    Laboratory Results   Blood Tests   Date/Time Received: June 09, 2008 9:26 AM  Date/Time Reported: June 09, 2008 9:50 AM   HGBA1C: 13.1%   (Normal Range: Non-Diabetic - 3-6%   Control Diabetic - 6-8%)  Comments: ...........test performed by...........Marland KitchenTerese Door, CMA

## 2010-06-14 NOTE — Progress Notes (Signed)
Summary: Rx Req  Phone Note Refill Request Call back at Home Phone [phone] Message from:  Patient  Refills Requested: Medication #1:  LANTUS 100 UNIT/ML SOLN 30 units subcutaneously qam - disp 30 day supply [facility]  Initial call taken by: Clydell Hakim,  February 15, 2010 3:27 PM    Prescriptions: LANTUS 100 UNIT/ML SOLN (INSULIN GLARGINE) 30 units subcutaneously qam - disp 30 day supply  #1 x 11   Entered and Authorized by:   Delbert Harness MD   Signed by:   Delbert Harness MD on 02/15/2010   Method used:   [facility]ically to        [facility] [address] [phone]* (retail)       [address]       [place], [place]  [address]       Ph: [phone] or [phone]       Fax: [phone]   RxID:   [id number]

## 2010-06-14 NOTE — Assessment & Plan Note (Signed)
Summary: f/u,df   Vital Signs:  Patient profile:   55 year old female Height:      66 inches Weight:      207 pounds BMI:     33.53 Temp:     97.5 degrees F oral Pulse rate:   93 / minute BP sitting:   121 / 83  (left arm)  Vitals Entered By: Terese Door (February 22, 2009 4:12 PM) CC: F/U Is Patient Diabetic? No Pain Assessment Patient in pain? yes     Location: left arm Intensity: 8 Type: aching   Primary Care Provider:  Delbert Harness MD  CC:  F/U.  History of Present Illness: 55 yo here for routine follow-up of DM, HTN, shoulder pain  diabetes: taking lantus and metformin for diabetes as prescrobed without hypoglycemia.  Checking cbg's, brings in meter.  Had some elevations over the weekend due to "homecoming" and dietary indiscretion but overal 1wee, 2 week, and month average in the 125-150 range.    HYPERTENSION Meds: Taking and tolerating: yes Home BP's: no but checks at work.  States it has been normal Chest Pain: no Dyspnea: no  Shoulder pain:  continues right sided shoulder pain.  Evaluation back in March shows arthritic changes but physical exam at that time did not suggest problem with joint.  treated for MSK strain with flexerile diclofenac with no improvement.  patient has been taking eleve with some improvement.  Pain worst at end of long day.  She says she "puts up with pain" and can perform work and home functions.   Habits & Providers  Alcohol-Tobacco-Diet     Tobacco Status: never  Current Medications (verified): 1)  Aspirin Ec 81 Mg Tbec (Aspirin) .... Take 1 Tablet By Mouth Once A Day 2)  Lantus 100 Unit/ml Soln (Insulin Glargine) .... 25 Units Subcutaneously Qam - Disp 30 Day Supply - Place Script On Hold 3)  Metformin Hcl 1000 Mg Tabs (Metformin Hcl) .... Take 2 Tablet By Mouth Two Times A Day With Food 4)  Hyzaar 100-25 Mg Tabs (Losartan Potassium-Hctz) .... Take 1 Tablet By Mouth Once A Day -  - Place Script On Hold 5)  Lovastatin 40 Mg  Tabs  (Lovastatin) .Marland Kitchen.. 1 Tablet At Bedtime - Place Script On Hold 6)  Flexeril 10 Mg Tabs (Cyclobenzaprine Hcl) .... 1/2 To 1 Tablet By Mouth At Bedtime As Needed For Shoulder Pain - Do Not Drive or Drink Alcohol While Taking 7)  Diclofenac Sodium 75 Mg Tbec (Diclofenac Sodium) .Marland Kitchen.. 1 Tablet By Mouth Two Times A Day With Food As Needed For Shoulder Pain 8)  Omeprazole 20 Mg Cpdr (Omeprazole) .Marland Kitchen.. 1 Tablet By Mouth Daily For Your Stomach  Allergies: 1)  ! Ace Inhibitors 2)  ! * Statins PMH-FH-SH reviewed-no changes except otherwise noted  Review of Systems      See HPI General:  Denies fever.  Physical Exam  General:  Overweight-appearing, no acute distress.  Alert and oriented x 3.  Neck:  no pain on left shoulder range of motion.  Tender left trapesius as base of neck.  feel better with deep palpation. Lungs:  Clear to auscultation bilaterally.  Normal work of breathing.  No wheezes, rales, or rhonchi.  Heart:  Regular rate and rhythm.  No murmur, rub, or gallop.  2+ Dorsalis Pedis pulses.  Extremities:  No clubbing, cyanosis, or edema.    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  I expect her next a1c to be improved.    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  advised to send labs drawn by work to our office.  Wlll follow-up in 2-3 months at which time will get bmet, microalbumin, and follow-up on diabetes screening.  Her updated medication list for this problem includes:    Aspirin Ec 81 Mg Tbec (Aspirin) .Marland Kitchen... Take 1 tablet by mouth once a day    Lantus 100 Unit/ml Soln (Insulin glargine) .Marland Kitchen... 25 units subcutaneously qam - disp 30 day supply - place script on hold    Metformin Hcl 1000 Mg Tabs (Metformin hcl) .Marland Kitchen... Take 2 tablet by mouth two times a day with food    Hyzaar 100-25 Mg Tabs (Losartan potassium-hctz) .Marland Kitchen... Take 1 tablet by mouth once a day -  - place script on  hold  Orders: [facility]- Est  Level 4 ([address])  Problem # 2:  HYPERTENSION, BENIGN SYSTEMIC (ICD-401.1) BP well co ntrolled.   No changes today. Her updated medication list for this problem includes:    Hyzaar 100-25 Mg Tabs (Losartan potassium-hctz) .Marland Kitchen... Take 1 tablet by mouth once a day -  - place script on hold  Orders: [facility]- Est  Level 4 ([address])  BP today: 121/83 Prior BP: 113/76 (12/28/2008)  Labs Reviewed: K+: 4.6 (11/30/2008) Creat: : 0.82 (05/28/2008)   Chol: 223 (11/30/2008)   HDL: 77 (11/30/2008)   LDL: 131 (11/30/2008)   TG: 53 (05/28/2008)  Problem # 3:  SHOULDER PAIN, LEFT (ICD-719.41) more muscular than shoulder pain.  Will send to physical therapy, advised tylenol, nsaids but advised caution on high nsaid use, usuing multiples.  Her updated medication list for this problem includes:    Aspirin Ec 81 Mg Tbec (Aspirin) .Marland Kitchen... Take 1 tablet by mouth once a day    Flexeril 10 Mg Tabs (Cyclobenzaprine hcl) .Marland Kitchen... 1/2 to 1 tablet by mouth at bedtime as needed for shoulder pain - do not drive or drink alcohol while taking    Diclofenac Sodium 75 Mg Tbec (Diclofenac sodium) .Marland Kitchen... 1 tablet by mouth two times a day with food as needed for shoulder pain  Orders: [facility]- Est  Level 4 ([address]) Physical Therapy Referral (PT)  Complete Medication List: 1)  Aspirin Ec 81 Mg Tbec (Aspirin) .... Take 1 tablet by mouth once a day 2)  Lantus 100 Unit/ml Soln (Insulin glargine) .... 25 units subcutaneously qam - disp 30 day supply - place script on hold 3)  Metformin Hcl 1000 Mg Tabs (Metformin hcl) .... Take 2 tablet by mouth two times a day with food 4)  Hyzaar 100-25 Mg Tabs (Losartan potassium-hctz) .... Take 1 tablet by mouth once a day -  - place script on hold 5)  Lovastatin 40 Mg Tabs (Lovastatin) .Marland Kitchen.. 1 tablet at bedtime - place script on hold 6)  Flexeril 10 Mg Tabs (Cyclobenzaprine hcl) .... 1/2 to 1 tablet by mouth at bedtime as needed for shoulder pain - do not drive or drink alcohol  while taking 7)  Diclofenac Sodium 75 Mg Tbec (Diclofenac sodium) .Marland Kitchen.. 1 tablet by mouth two times a day with food as needed for shoulder pain 8)  Omeprazole 20 Mg Cpdr (Omeprazole) .Marland Kitchen.. 1 tablet by mouth daily for your stomach  Other Orders: Influenza Vaccine NON MCR ([address]11)  Patient Instructions: 1)  dont take diclofenac and aleve at the same time- pick one 2)  may also add on tylenol for improved pain control 3)  Will send you to physical therapy towork on stregthening your shoudler 4)  Be good over the holidays!  Work on adding fresh vegatebles, decreasing salt, exercising 30 minutes 5 times weekly. 5)  Follow-up in 3 months   Influenza Vaccine    Vaccine Type: Fluvax Non-MCR    Site: left deltoid    Mfr: GlaxoSmithKline    Dose: 0.5 ml    Route: IM    Given by: Alphia Kava    Exp. Date: 11/11/2009    Lot #: [id number]    VIS given: 12/06/06 version given February 22, 2009.  Flu Vaccine Consent Questions    Do you have a history of severe allergic reactions to this vaccine? no    Any prior history of allergic reactions to egg and/or gelatin? no    Do you have a sensitivity to the preservative Thimersol? no    Do you have a past history of Guillan-Barre Syndrome? no    Do you currently have an acute febrile illness? no    Have you ever had a severe reaction to latex? no    Vaccine information given and explained to patient? no    Are you currently pregnant? no    Prevention & Chronic Care Immunizations   Influenza vaccine: Fluvax Non-MCR  (02/22/2009)   Influenza vaccine due: 03/03/2009    Tetanus booster: 05/15/2001: Done.   Tetanus booster due: 05/16/2011    Pneumococcal vaccine: Not documented  Colorectal Screening   Hemoccult: Not documented   Hemoccult due: Not Indicated    Colonoscopy: normal  (05/22/2006)   Colonoscopy due: 05/22/2016  Other Screening   Pap smear: normal  (06/15/2008)   Pap smear due: 06/16/2011    Mammogram: ASSESSMENT: Negative -  BI-RADS 1^MM DIGITAL SCREENING  (09/21/2008)   Mammogram due: 09/15/2009   Smoking status: never  (02/22/2009)  Diabetes Mellitus   HgbA1C: 8.8  (12/28/2008)   Hemoglobin A1C due: 09/07/2008    Eye exam: normal  (10/18/2007)   Eye exam due: 10/17/2008    Foot exam: yes  (08/05/2008)   High risk foot: Not documented   Foot care education: completed  (12/09/2007)   Foot exam due: 03/03/2009    Urine microalbumin/creatinine ratio: Not documented   Urine microalbumin/cr due: 03/03/2009  Lipids   Total Cholesterol: 223  (11/30/2008)   LDL: 131  (11/30/2008)   LDL Direct: Not documented   HDL: 77  (11/30/2008)   Triglycerides: 53  (05/28/2008)    SGOT (AST): 30  (11/30/2008)   SGPT (ALT): 30  (11/30/2008)   Alkaline phosphatase: 112  (11/30/2008)   Total bilirubin: 0.2  (11/30/2008)    Lipid flowsheet reviewed?: Yes   Progress toward LDL goal: Unchanged  Hypertension   Last Blood Pressure: 121 / 83  (02/22/2009)   Serum creatinine: 0.82  (05/28/2008)   Serum potassium 4.6  (11/30/2008)    Hypertension flowsheet reviewed?: Yes   Progress toward BP goal: At goal  Self-Management Support :    Patient will work on the following items until the next clinic visit to reach self-care goals:     Medications and monitoring: take my medicines every day, check my blood sugar, check my blood pressure  (02/22/2009)     Eating: eat more vegetables, eat foods that are low in salt  (02/22/2009)     Activity: take a 30 minute walk every day  (02/22/2009)    Diabetes self-management support: Copy of home glucose meter record  (02/22/2009)    Hypertension self-management  support: Not documented    Lipid self-management support: Not documented

## 2010-06-14 NOTE — Letter (Signed)
Summary: Lipid Letter  [facility]  [address]   [place], [place] [address]   Phone: [phone]  Fax: [phone]    03/04/2008  Kathy Duncan [address] [place], [place]  [address]  Dear Kathy Duncan:  We have carefully reviewed your last lipid profile from 03/03/2008 and the results are noted below with a summary of recommendations for lipid management.  Your electrolytes, liver, and kidney function were normal.    Cholesterol:       175     Goal: <  200   HDL "good" Cholesterol:   66     Goal: >  40   LDL "bad" Cholesterol:   92     Goal: <  100, ideally < 70   Triglycerides:       84     Goal: <  150    TLC Diet (Therapeutic Lifestyle Change): Saturated Fats & Trans Fats should be kept < 7% of total calories ***Reduce Saturated Fats Polyunstaurated Fat can be up to 10% of total calories Monounsaturated Fat Fat can be up to 20% of total calories Total Fat should be no greater than 25-35% of total calories Carbohydrates should be 50-60% of total calories Protein should be approximately 15% of total calories Fiber should be at least 20-30 grams a day ***Increased fiber may help lower LDL Total Cholesterol should be < 200mg /day Consider adding plant stanol/sterols to diet (example: Benacol spread) ***A higher intake of unsaturated fat may reduce Triglycerides and Increase HDL   Adjunctive Measures (may lower LIPIDS and reduce risk of Heart Attack) include: Aerobic Exercise (20-30 minutes 3-4 times a week) Limit Alcohol Consumption Weight Reduction Aspirin 75-81 mg a day by mouth (if not allergic or contraindicated) Dietary Fiber 20-30 grams a day by mouth    Current Medications: 1)    Aspirin Ec 81 Mg Tbec (Aspirin) .... Take 1 tablet by mouth once a day 2)    Hyzaar 100-25 Mg Tabs (Losartan potassium-hctz) .... Take 1 tablet by mouth once a day 3)    Lantus 100 Unit/ml Soln (Insulin glargine) .Marland Kitchen.. 15 units subcutaneously once a day 4)    Metformin Hcl  1000 Mg Tabs (Metformin hcl) .... Take 1 tablet by mouth two times a day 5)    Lovastatin 40 Mg  Tabs (Lovastatin) .Marland Kitchen.. 1 tablet at bedtime 6)    Flexeril 10 Mg Tabs (Cyclobenzaprine hcl) .... 1/2 to 1 tablet by mouth at bedtime as needed   If you have any questions, please call. We appreciate being able to work with you.   Sincerely,   [facility] Kathy Dun MD  Appended Document: [facility]  Colonoscopy Result Date:  05/22/2006 Colonoscopy Result:  normal Colonoscopy Next Due:  10 yr

## 2010-06-14 NOTE — Assessment & Plan Note (Signed)
Summary: f/u,df   Vital Signs:  Patient profile:   55 year old female Height:      66 inches Weight:      202.8 pounds Temp:     97.9 degrees F Pulse rate:   97 / minute  Vitals Entered By: Theresia Lo RN (October 15, 2008 2:27 PM) CC: follow up Is Patient Diabetic? Yes  Pain Assessment Patient in pain? no        Primary Care Provider:  [name] MD  CC:  follow up.  History of Present Illness: 55 yo [name] who presents for routine f/u of DM, Obesity, HTN, HLD.  Also continues to c/o Left Shoulder Pain.  1.  Diabetes - Taking medications without problems - Taking 25 units Lantus qam (increased from 15 units previously), but hasn't titrated any further.  Has switched to taking in the evening which has helped with early evening low blood sugars.  Checking home blood sugars - tend to be high in the morning 150-200, but are good in the evening before dinner (90's).  Not checking before bed.  Has started fasting with church today - 3 days total.  Denies dizziness, shakiness, confusion, polyuria, polydipsia, or nocturia.  Denies recent infection or complications.   2.  Obesity - Has lost 4 lbs since last OV.  Getting regular exercise (goes to classes at [facility]) 3x / week x 1 hour, plans to start dance classes on other days.  24 hour recall: Awoke 4:50 am - sugar 116 6 am:  Protein Shake (special K) 9am:  4 strawberries, 5 carrots 12 pm:  [place] breast sandwich (3 slices [place], 2 slices wheat bread, 1/2 Tbs mayo), Crystal Light 4:30 pm:  apple 5:30 pm - sugar 125 Church 9 pm:  3 hard boiled eggs Bed 11 pm  Drinking lots of water throughout the day.  Drinks a V8 most days.   Typical Dinner:  Chicken (boiled), spinach, rice or pasta Eats out maybe once per month  3.  Left Shoulder Pain - Remains much improved.  Requests refill on Diclofenac and Flexeril.  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 Take 1 Tablet By Mouth Once A Day 2)  Lantus 100 Unit/ml Soln (Insulin Glargine) .... 25 Units Subcutaneously Qam - Disp 30 Day Supply - Place Script On Hold 3)  Metformin Hcl 1000 Mg Tabs (Metformin Hcl) .... Take 1 Tablet By Mouth Two Times A Day With Food - Place Script On Hold 4)  Hyzaar 100-25 Mg Tabs (Losartan Potassium-Hctz) .... Take 1 Tablet By Mouth Once A Day -  - Place Script On Hold 5)  Lovastatin 40 Mg  Tabs (Lovastatin) .Marland Kitchen.. 1 Tablet At Bedtime - Place Script On Hold 6)  Flexeril 10 Mg Tabs (Cyclobenzaprine Hcl) .... 1/2 To 1 Tablet By Mouth At Bedtime As  Needed For Shoulder Pain - Do Not Drive or Drink Alcohol While Taking 7)  Diclofenac Sodium 75 Mg Tbec (Diclofenac Sodium) .Marland Kitchen.. 1 Tablet By Mouth Two Times A Day With Food As Needed For Shoulder Pain 8)  Omeprazole 20 Mg Cpdr (Omeprazole) .Marland Kitchen.. 1 Tablet By Mouth Daily For Your Stomach  Allergies (verified): 1)  ! Ace Inhibitors 2)  ! * Statins  Past History:  Past medical, surgical, family and social histories (including risk factors) reviewed, and no changes noted (except as noted below).  Past Medical History: Reviewed history from 03/03/2008 and no changes required. DIABETES MELLITUS, II, COMPLICATIONS (ICD-250.92) HYPERLIPIDEMIA NEC/NOS (ICD-272.4) HYPERTENSION, BENIGN SYSTEMIC  (ICD-401.1) RENAL FAILURE, MILD (ICD-586) OBESITY, NOS (ICD-278.00) GASTROESOPHAGEAL REFLUX, NO ESOPHAGITIS (ICD-530.81) ANEMIA, IRON DEFICIENCY, UNSPEC. (ICD-280.9) INSOMNIA NOS (ICD-780.52) CARPAL TUNNEL SYNDROME, BILATERAL (ICD-354.0) RHINITIS, ALLERGIC (ICD-477.9) ECZEMA, ATOPIC DERMATITIS (ICD-691.8) Hx of KNEE MENISCUS INJURY, UNSPECIFIED  - Rt knee 6/02 (ICD-836.2) Hx of GONNORHEA 1999    Past Surgical History: Reviewed history from 08/05/2008 and no changes required. 6/01 hysterectomy for fibroids; still with ovary - paps by Dr. Cherly Hensen (last 2/10) Depo-Lupron 03-01 - 08/20/1999  Family History: Reviewed history from 12/09/2007 and no changes required. 2 Sisters-Both have DM Father-CVA Mom died age 78-Heart Disease  Social History: Reviewed history from 06/09/2008 and no changes required. 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  Exercises 4 times per week - in programs.    Review of Systems      See HPI  Physical Exam  General:  Overweight-appearing, no acute distress.  Alert and oriented x 3.  Lungs:  Clear to auscultation bilaterally.  Normal work of breathing.  No wheezes, rales, or rhonchi.  Heart:  Regular rate and rhythm.  No murmur, rub, or gallop.  2+ Dorsalis Pedis pulses.  Msk:  Left Shoulder: Normal strength and ROM Extremities:  No clubbing, cyanosis, or edema.    Impression & Recommendations:  Problem # 1:  DIABETES MELLITUS, II, COMPLICATIONS (ICD-250.92) Assessment Unchanged After 3 day fast with church is over, advised to titrate up Lantus as previously directed - 2 units daily until fasting am sugars consistently < 150.  Call if lows.  Continue protein with lunch.   Her updated medication list for this problem includes:    Aspirin Ec 81 Mg Tbec (Aspirin) .Marland Kitchen... Take 1 tablet by mouth once a day    Lantus 100 Unit/ml Soln (Insulin glargine) .Marland Kitchen... 25  units subcutaneously qam - disp 30 day supply - place script on hold    Metformin Hcl 1000 Mg Tabs (Metformin hcl) .Marland Kitchen... Take 1 tablet by mouth two times a day with food - place script on hold    Hyzaar 100-25 Mg Tabs (Losartan potassium-hctz) .Marland Kitchen... Take 1 tablet by mouth once a day -  - place script on hold Orders: [facility]- Est  Level 4 (52841)  Problem # 2:  OBESITY, NOS (ICD-278.00) Assessment: Unchanged Has lost 4 lbs. since last OV.  Commended on success.  Much improvement in 24 hour recall.  Still not getting in many veggies.  See pt instructions for new goals. Orders: [facility]- Est  Level 4 (32440)  Problem # 3:  SHOULDER PAIN, LEFT (ICD-719.41) Assessment: Unchanged Stable on current medications.  Refills given. Her updated medication list for this problem includes:    Aspirin Ec 81 Mg Tbec (Aspirin) .Marland Kitchen... Take 1 tablet by mouth once a day  Flexeril 10 Mg Tabs (Cyclobenzaprine hcl) .Marland Kitchen... 1/2 to 1 tablet by mouth at bedtime as needed for shoulder pain - do not drive or drink alcohol while taking    Diclofenac Sodium 75 Mg Tbec (Diclofenac sodium) .Marland Kitchen... 1 tablet by mouth two times a day with food as needed for shoulder pain Orders: [facility]- Est  Level 4 (99214)  Complete Medication List: 1)  Aspirin Ec 81 Mg Tbec (Aspirin) .... Take 1 tablet by mouth once a day 2)  Lantus 100 Unit/ml Soln (Insulin glargine) .... 25 units subcutaneously qam - disp 30 day supply - place script on hold 3)  Metformin Hcl 1000 Mg Tabs (Metformin hcl) .... Take 1 tablet by mouth two times a day with food - place script on hold 4)  Hyzaar 100-25 Mg Tabs (Losartan potassium-hctz) .... Take 1 tablet by mouth once a day -  - place script on hold 5)  Lovastatin 40 Mg Tabs (Lovastatin) .Marland Kitchen.. 1 tablet at bedtime - place script on hold 6)  Flexeril 10 Mg Tabs (Cyclobenzaprine hcl) .... 1/2 to 1 tablet by mouth at bedtime as needed for shoulder pain - do not drive or drink alcohol while taking 7)  Diclofenac Sodium 75 Mg  Tbec (Diclofenac sodium) .Marland Kitchen.. 1 tablet by mouth two times a day with food as needed for shoulder pain 8)  Omeprazole 20 Mg Cpdr (Omeprazole) .Marland Kitchen.. 1 tablet by mouth daily for your stomach  Patient Instructions: 1)  Please schedule a follow-up appointment in 1-2 months with your new doctor regarding your weight and diabetes. 2)  Increase your Lantus by 2 units each day if your fasting blood sugar in the morning is > 150. 3)  Stop titrating up once your blood sugar is < 150 in the mornig or if you are having low blood sugar (< 70) in the afternoons. 4)  KEEP UP THE GOOD WORK WITH YOUR DIET AND EXERCISE. 5)  Continue doing a good job with exercise - try to get in some walking or dancing on days you don't go to the gym. 6)  Eat at least one vegetable with lunch and dinner. 7)  Continue eating some protein at lunch (lean meat, hard boiled egg, peanut butter, yogurt, low fat cheese). Prescriptions: DICLOFENAC SODIUM 75 MG TBEC (DICLOFENAC SODIUM) 1 tablet by mouth two times a day with food as needed for shoulder pain  #60 x 1   Entered and Authorized by:   Kathy Dun MD   Signed by:   Kathy Dun MD on 10/15/2008   Method used:   [facility]ed to ...       [facility] [address] [phone]* (retail)       [address]       [place], [place]  [address]       Ph: [phone] or [phone]       Fax: [phone]   RxID:   [id number] FLEXERIL 10 MG TABS (CYCLOBENZAPRINE HCL) 1/2 to 1 tablet by mouth at bedtime as needed for shoulder pain - do not drive or drink alcohol while taking  #30 x 1   Entered and Authorized by:   Kathy Dun MD   Signed by:   Kathy Dun MD on 10/15/2008   Method used:   [facility]ed to ...       [facility] [address] [address] [facility] [address] [phone]* (retail)       [address]       [place]  [place], [place]  [address]       Ph: [phone] or [phone]       Fax: [phone]   RxID:   [id number]

## 2010-06-14 NOTE — Assessment & Plan Note (Signed)
Summary: fu wk   Vital Signs:  Patient Profile:   55 Years Old Female Height:     66 inches Weight:      195 pounds BMI:     31.59 BSA:     1.98 Temp:     98.6 degrees F Pulse rate:   84 / minute BP sitting:   111 / 73  Pt. in pain?   no  Vitals Entered By: Golden Circle RN (Sep 21, 2006 11:17 AM)                Chief Complaint:  dm f/u.  History of Present Illness: CC:  FU BP, Dm  With situps and legs up in air gets headache and leg pains, only when lying down.  headache in front.  pt has lost 26lbs since last visit.  pt has been trying to lose weight.  Takes aleve for headaches, took codeine yesterday.  takes aleve every day.  no caffeine.  not a smoker.    Hypertension : Using medication:  _x_ without problems or Lightheadedness:  _x_ none or Chest pain with exertion:- x__ none or  Average BPS at home SBP 122.   Diabetes : Using medications: _x_ without difficulties or Hypoglycemic episodes: __ none  or after exercise class 140s down to 82s, feels dizzy.   Feet problems:  _x_ none or Blood Sugars averaging: Takes Lantus 10Units.  takes metaglip and actos at night.  eating before exercise of protein.        Past Medical History:    Reviewed history from 07/12/2006 and no changes required:       6/02 suspect rt knee meniscal tear, 10/2000 - 1+ microalbumin, Carpal Tunnel Syndrome, Gonorrhea  11-99  Past Surgical History:    Reviewed history from 07/12/2006 and no changes required:       6/01 hysterectomy for fibroids; still with ovary - 12/30/1999, Depo-Lupron 03-01 - 08/20/1999      Physical Exam  General:     Well-developed,well-nourished,in no acute distress; alert,appropriate and cooperative throughout examination Head:     Normocephalic and atraumatic without obvious abnormalities. No apparent alopecia or balding. Eyes:     No corneal or conjunctival inflammation noted. EOMI. Perrla. Funduscopic exam benign, without hemorrhages, exudates or  papilledema. Vision grossly normal. Mouth:     Oral mucosa and oropharynx without lesions or exudates.  Teeth in good repair. Lungs:     Normal respiratory effort, chest expands symmetrically. Lungs are clear to auscultation, no crackles or wheezes. Heart:     Normal rate and regular rhythm. S1 and S2 normal without gallop, murmur, click, rub or other extra sounds. Abdomen:     Bowel sounds positive,abdomen soft and non-tender without masses, organomegaly or hernias noted. Pulses:     R and L carotid,radial,femoral,dorsalis pedis and posterior tibial pulses are full and equal bilaterally Extremities:     No clubbing, cyanosis, edema, or deformity noted with normal full range of motion of all joints.   Neurologic:     No cranial nerve deficits noted. Station and gait are normal.  Diabetes Management Exam:    Foot Exam (with socks and/or shoes not present):       Sensory-Pinprick/Light touch:          Left medial foot (L-4): normal          Left dorsal foot (L-5): normal          Left lateral foot (S-1): normal  Right medial foot (L-4): normal          Right dorsal foot (L-5): normal          Right lateral foot (S-1): normal       Sensory-Monofilament:          Left foot: normal          Right foot: normal       Inspection:          Left foot: normal          Right foot: normal       Nails:          Left foot: normal          Right foot: normal    Impression & Recommendations:  Problem # 1:  HYPERTENSION, BENIGN SYSTEMIC (ICD-401.1) Assessment: Improved cont current regimen of hctz 25mg  qday and cozaar through her work program.   Orders: [facility]- Est  Level 4 ([address])   Problem # 2:  DIABETES MELLITUS, II, COMPLICATIONS (ICD-250.92) Assessment: Improved will check hba1c.  will d/c glipizide.  will encourage pt to increase pre-exercise meal approx 100cal of starch/carbohydrate or move protien shake to earlier in the day due to low blood sugars after exercising.  will  refill metformin, actos, and lantus.  may need to also decrease lantus with weight loss and improved control.  will follow up in  3 months.   Orders: [facility]- Est  Level 4 (99214) A1C-[facility] (60454) Basic Met-[facility] ([address]-91478)   Problem # 3:  SYMPTOM, HEADACHE (ICD-784.0) most likely secondary to rebound headache from medication overuse.  will have pt stop all nsaids for next 15 days.  informed pt that headaches may get worse for next several days but will eventually improve.  pt to not use aleve more than 3 days in a row and no more than 15 days per month.  also, encourage more water before exercising.   Orders: [facility]- Est  Level 4 (29562)   Problem # 4:  OBESITY, NOS (ICD-278.00) Assessment: Improved lost 26lbs!! encouraged pt to keep going! Orders: [facility]- Est  Level 4 (13086)    Patient Instructions: 1)  Please schedule a follow-up appointment in 3 months for diabetes and hypertension. 2)  Increase pre-exercise meal approx 100cal of starch/carbohydrate or move protien shake to earlier in the day due to low blood sugars after exercising.  may need to decrease Lantus insulin if AM fasting sugars are <100.  If so, decrease by 1 unit until around 100.   3)  [facility]    Laboratory Results   Blood Tests   Date/Time Recieved: Sep 21, 2006 11:40  AM  Date/Time Reported: Sep 21, 2006 11:55 AM   HGBA1C: 8.1%   (Normal Range: Non-Diabetic - 3-6%   Control Diabetic - 6-8%)  Comments: ...............test performed by......Marland KitchenBonnie A. [place], [place] (ASCP)

## 2010-06-14 NOTE — Progress Notes (Signed)
Summary: phn msg  Phone Note Call from Patient Call back at [facility] Phone [phone]   Caller: Patient Summary of Call: Pt says she was to be set up for physical therapy and has not heard anything about when. Initial call taken by: Clydell Hakim,  May 26, 2009 2:05 PM  Follow-up for Phone Call        Would you please check up on the status of this?  It looks like the referral was faxed at the time of her last visit. Follow-up by: Delbert Harness MD,  May 26, 2009 6:56 PM  Additional Follow-up for Phone Call Additional follow up Details #1::        called pt and left vm for her to call the ofc back  Additional Follow-up by: Loralee Pacas CMA,  May 28, 2009 8:57 AM

## 2010-06-14 NOTE — Consult Note (Signed)
Summary: [facility]   Imported By: Clydell Hakim 02/09/2010 10:51:33  _____________________________________________________________________  External Attachment:    Type:   Image     Comment:   External Document  Appended Document: [facility] Starting tamoxifen for breast ca prevention.  needs annual eye exam.  breast MRI's- set up by oncology Delbert Harness MD  February 09, 2010 12:36 PM    Clinical Lists Changes  Medications: Added new medication of TAMOXIFEN CITRATE 20 MG TABS (TAMOXIFEN CITRATE) one by mouth daily

## 2010-06-14 NOTE — Assessment & Plan Note (Signed)
Summary: SENSITIVITY CHARGE   

## 2010-06-14 NOTE — Consult Note (Signed)
Summary: [facility]: GI Follow-up  [facility]   Imported By: Bradly Bienenstock 11/06/2009 13:21:03  _____________________________________________________________________  External Attachment:    Type:   Image     Comment:   External Document

## 2010-06-14 NOTE — Assessment & Plan Note (Signed)
Summary: ck sugar,df   Vital Signs:  Patient profile:   55 year old female Height:      66 inches Weight:      208.3 pounds BMI:     33.74 Temp:     98.1 degrees F oral Pulse rate:   92 / minute BP sitting:   156 / 89  (left arm) Cuff size:   large  Vitals Entered By: Dedra Skeens CMA, (August 05, 2008 8:35 AM)  Serial Vital Signs/Assessments:  Time      Position  BP       Pulse  Resp  Temp     By                     126/80                         [name] MD  Comments: Left arm sitting, manual, normal cuff By: [name] MD   Is Patient Diabetic? No Pain Assessment Patient in pain? no        History of Present Illness: 55 yo [name] who presents for routine f/u of DM, Obesity, HTN, HLD.  Also continues to c/o Left Shoulder Pain.  1.  Diabetes - Taking medications without problems - Taking 25 units Lantus qam (increased from 15 units previously).  Checking home blood sugars - tend to be high in the morning 190 - 230, but are good in the evening before dinner (90's).  Not checking before bed.  Denies dizziness, shakiness, confusion, polyuria, polydipsia, or nocturia.  Denies recent infection or complications.   2.  Obesity - Getting regular exercise (goes to classes at [facility]) 3x / week.  24 hour recall: Awoke 4:40 am 9 am:  1 link of smoked sausage with 1 slice of wheat breat, diet snapple 12 pm:  At 1/2 of plate-sized salad (ham, cheese, tomatoes), Slice of strawberry shortcake, diet snapple 3 pm:  6 snackwell cookies, water 5:30 pm - sugar 106 6 pm:  Cheddar Jack Cheezits - 2 cups, diet snapple, Weight watcher black velvet cake bar (2 points) Bed 11 pm  3.  HTN - Tolerating Medications without problems.  Denies CP, SOB, edema, HA, light-headeness.     4.  HLD - Taking medications without problems.  Denies muscle aches or RUQ pain.     5.  Left Shoulder Pain - Muscle spasms.  Bothers most when relaxed after getting home from work.  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  Taking Aleve, Flexeril, Heat patches, topical analgesics.  Nothing working sufficiently.  Flexeril helps sleep.    Habits & Providers     Tobacco Status: never     Gynecologist: Dr. 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 Take 1 Tablet By  Mouth Once A Day 2)  Lantus 100 Unit/ml Soln (Insulin Glargine) .Marland Kitchen.. 15 Units Subcutaneously Once A Day 3)  Metformin Hcl 1000 Mg Tabs (Metformin Hcl) .... Take 1 Tablet By Mouth Two Times A Day 4)  Hyzaar 100-25 Mg Tabs (Losartan Potassium-Hctz) .... Take 1 Tablet By Mouth Once A Day 5)  Lovastatin 40 Mg  Tabs (Lovastatin) .Marland Kitchen.. 1 Tablet At Bedtime 6)  Flexeril 10 Mg Tabs (Cyclobenzaprine Hcl) .... 1/2 To 1 Tablet By Mouth At Bedtime As Needed For Shoulder Pain - Do Not Drive or Drink Alcohol While Taking  Allergies (verified): 1)  ! Ace Inhibitors 2)  ! * Statins  Past History:  Past Medical History:    DIABETES MELLITUS, II, COMPLICATIONS (ICD-250.92)    HYPERLIPIDEMIA NEC/NOS (ICD-272.4)    HYPERTENSION, BENIGN SYSTEMIC (ICD-401.1)    RENAL FAILURE, MILD (ICD-586)    OBESITY, NOS (ICD-278.00)    GASTROESOPHAGEAL REFLUX, NO ESOPHAGITIS (ICD-530.81)    ANEMIA, IRON DEFICIENCY, UNSPEC. 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  Exercises 4 times per week - in programs.   (06/09/2008)  Past Surgical History:    6/01 hysterectomy for fibroids; still with ovary - paps by Dr. Cherly Hensen (last 2/10)    Depo-Lupron 03-01 - 08/20/1999  Review of Systems      See HPI  Physical Exam  General:  Overweight-appearing, no acute distress.  Alert and oriented x 3.  Lungs:  Clear to auscultation bilaterally.  Normal work of breathing.  No wheezes, rales, or rhonchi.  Heart:  Regular rate and rhythm.  No murmur, rub, or gallop.  2+ Dorsalis Pedis pulses.  Msk:  Diffuse muscle tightness and soreness in neck / shoulders.  Normal on inspection.  Some tenderness as well over shoulder joint, though no focal point tenderness. Negative Neers, Empty Can, Cross-over, Hawkin's, and Aprehension tests. Normal strength and ROM. Extremities:  No clubbing, cyanosis, or edema.  Neurologic:  Normal sensation an strength in UE.  Diabetes Management Exam:    Foot Exam (with socks and/or shoes not present):       Inspection:          Left foot: normal          Right foot: normal       Nails:          Left foot: normal          Right foot: normal   Impression & Recommendations:  Problem # 1:  DIABETES MELLITUS, II, COMPLICATIONS (ICD-250.92) Assessment Improved Sugars are overall much improved compared to last OV; however, remain elevated in am fasting.  Pt eating large carb load before bed as evidenced by 24 hr recall.  Did not  bring meter and not time yet for A1c, thus will not adjust medicines yet.  Instead encouraged changing eating habits as sugar before dinner is good.  F/u in 1 month for A1c. Her  updated medication list for this problem includes:    Aspirin Ec 81 Mg Tbec (Aspirin) .Marland Kitchen... Take 1 tablet by mouth once a day    Lantus 100 Unit/ml Soln (Insulin glargine) .Marland Kitchen... 25 units subcutaneously qam - disp 30 day supply - place script on hold    Metformin Hcl 1000 Mg Tabs (Metformin hcl) .Marland Kitchen... Take 1 tablet by mouth two times a day with food - place script on hold    Hyzaar 100-25 Mg Tabs (Losartan potassium-hctz) .Marland Kitchen... Take 1 tablet by mouth once a day -  - place script on hold Orders: [facility]- Est  Level 4 ([address])  Problem # 2:  OBESITY, NOS (ICD-278.00) Assessment: Unchanged Discussed ways to improve diet based on 24 hr recall.  Particularly recommended eating sit-down dinner with twice as many vegs as protein / carbs.  Discussed meal options.  Encouraged to choose healthier snacks as well.  Continue regular exercise.   Orders: [facility]- Est  Level 4 ([address])  Problem # 3:  HYPERTENSION, BENIGN SYSTEMIC (ICD-401.1) Improved on recheck.  Continue current medication.  Refills given.  Her updated medication list for this problem includes:    Hyzaar 100-25 Mg Tabs (Losartan potassium-hctz) .Marland Kitchen... Take 1 tablet by mouth once a day -  - place script on hold Orders: [facility]- Est  Level 4 ([address]) BP today: 156/89 --> 126/80 on recheck. Prior BP: 132/79 (06/09/2008) Labs Reviewed: K+: 4.6 (05/28/2008) Creat: : 0.82 (05/28/2008)   Chol: 173 (05/28/2008)   HDL: 71 (05/28/2008)   LDL: 91 (05/28/2008)   TG: 53 (05/28/2008)  Problem # 4:  HYPERLIPIDEMIA NEC/NOS (ICD-272.4) Assessment: Unchanged Well-controlled on current medication.  Continue. Refills given.  Her updated medication list for this problem includes:    Lovastatin 40 Mg Tabs (Lovastatin) .Marland Kitchen... 1 tablet at bedtime - place script on hold Orders: [facility]- Est  Level 4  ([address])  Problem # 5:  SHOULDER PAIN, LEFT (ICD-719.41) Assessment: Unchanged Will send for x-ray to see if pt has some arthritis which could benefit from an injection.  Otherwise, advised to continue current treatment and encouraged massage / stretching. Her updated medication list for this problem includes:    Aspirin Ec 81 Mg Tbec (Aspirin) .Marland Kitchen... Take 1 tablet by mouth once a day    Flexeril 10 Mg Tabs (Cyclobenzaprine hcl) .Marland Kitchen... 1/2 to 1 tablet by mouth at bedtime as needed for shoulder pain - do not drive or drink alcohol while taking Orders: Diagnostic X-Ray/Fluoroscopy (Diagnostic X-Ray/Flu) [facility]- Est  Level 4 ([address])  Complete Medication List: 1)  Aspirin Ec 81 Mg Tbec (Aspirin) .... Take 1 tablet by mouth once a day 2)  Lantus 100 Unit/ml Soln (Insulin glargine) .... 25 units subcutaneously qam - disp 30 day supply - place script on hold 3)  Metformin Hcl 1000 Mg Tabs (Metformin hcl) .... Take 1 tablet by mouth two times a day with food - place script on hold 4)  Hyzaar 100-25 Mg Tabs (Losartan potassium-hctz) .... Take 1 tablet by mouth once a day -  - place script on hold 5)  Lovastatin 40 Mg Tabs (Lovastatin) .Marland Kitchen.. 1 tablet at bedtime - place script on hold 6)  Flexeril 10 Mg Tabs (Cyclobenzaprine hcl) .... 1/2 to 1 tablet by mouth at bedtime as needed for shoulder pain - do not drive or drink alcohol while taking  Patient Instructions: 1)  Please schedule a follow-up appointment in 1 month for your Diabetes. 2)  Continue your same dose of Metformin and Lantus for your  Diabetes. 3)  Have a sit-down dinner each night that is well-balanced with twice as many vegetables as carbohydrates and protein. 4)  Do not snack while lying in bed.  5)  Keep up the exercise classes! 6)    Prescriptions: FLEXERIL 10 MG TABS (CYCLOBENZAPRINE HCL) 1/2 to 1 tablet by mouth at bedtime as needed for shoulder pain - do not drive or drink alcohol while taking  #30 x 2   Entered and Authorized by:    [name] MD   Signed by:   [name] MD on 08/05/2008   Method used:   [facility]        [facility] [address] [phone]* (retail)       [address]       [place], [place]  [address]       Ph: [phone] or [phone]       Fax: [phone]   RxID:   [id number] LOVASTATIN 40 MG  TABS (LOVASTATIN) 1 tablet at bedtime - place script on hold  #30 x 3   Entered and Authorized by:   [name] MD   Signed by:   [name] MD on 08/05/2008   Method used:   [facility]ronically to        [facility] [address] [phone]* (retail)       [address]       [place], [place]  [address]       Ph: [phone] or [phone]       Fax: [phone]   RxID:   [id number] HYZAAR 100-25 MG TABS (LOSARTAN POTASSIUM-HCTZ) Take 1 tablet by mouth once a day -  - place script on hold  #30 x 0   Entered and Authorized by:   [name] MD   Signed by:   [name] MD on 08/05/2008   Method used:   [facility]ronically to        [facility] [address] [phone]* (retail)       [address]       [place], [place]  [address]       Ph: [phone] or [phone]       Fax: [phone]   RxID:   [id number] METFORMIN HCL 1000 MG TABS (METFORMIN HCL) Take 1 tablet by mouth two times a day with food - place script on hold  #60 x 3   Entered and Authorized by:   [name] MD   Signed by:   [name] MD on 08/05/2008   Method used:   Electronically to        [facility] [address] [phone]* (retail)       [address]       [place], [place]  [address]       Ph: [phone] or [phone]       Fax: [phone]   RxID:   [id number] LANTUS 100 UNIT/ML SOLN (INSULIN GLARGINE) 25 units subcutaneously qam - disp 30 day supply - place script on hold  #1 x 12   Entered and Authorized by:   [name] MD   Signed by:   [name] MD on 08/05/2008   Method used:   Electronically to        [facility]  [address] [phone]* (retail)  [address]       [place], [place]  [address]       Ph: [phone] or [phone]       Fax: [phone]   RxID:   [id number]   Last HDL:  71 (05/28/2008 9:26:28 PM) HDL Next Due:  1 yr Last LDL:  91 (05/28/2008 9:26:28 PM) LDL Next Due:  1 yr Last PAP:  normal (05/16/2007 4:29:46 PM) PAP Result Date:  06/15/2008 PAP Result:  normal PAP Next Due:  3 yr Last HGBA1C:  13.1 (06/09/2008 9:10:52 AM) HGBA1C Next Due:  3 mo

## 2010-06-14 NOTE — Assessment & Plan Note (Signed)
Summary: MRSA and DM   Vital Signs:  Patient Profile:   55 Years Old Female Height:     66 inches Weight:      191.8 pounds Temp:     98.2 degrees F Pulse rate:   73 / minute BP sitting:   108 / 69  (right arm)  Pt. in pain?   yes  Vitals Entered By: Arlyss Repress CMA, (March 27, 2007 8:33 AM)              Is Patient Diabetic? Yes      Chief Complaint:  f/up cellulitis pain 3/10.  History of Present Illness: abscesses- look improved.  still open, but healing well.  was mrsa sens to bactrim.   no fevers.  still with some area of firmness around sites, but otherwise doing well.  no adjacent cellulitis.    Pt is here today for follow up of diabetes.    DM: doing well.  continuing to lose weight with work exercise program.   Home sugars ranging:120 last night.   Using meds: using lantus, metformin and occasionally actos if it isn't too expensive.   Leg/foot pain:no Last Creatinine:0.82 Last HbA1C:7.2 Recent infections:yes, MRSA.   Feeling a little nauseated.  not exercising this week with abscesses and pain.      Current Allergies: ! ACE INHIBITORS ! * STATINS      Physical Exam  General:     Well-developed,well-nourished,in no acute distress; alert,appropriate and cooperative throughout examination Lungs:     Normal respiratory effort, chest expands symmetrically. Lungs are clear to auscultation, no crackles or wheezes. Heart:     Normal rate and regular rhythm. S1 and S2 normal without gallop, murmur, click, rub or other extra sounds. Skin:     3 areas 1 on L breast 1 cm by 1 cm  healing well, no drainage.  1 on epigastric region, firm but no drainage.   1 on RLQ decreased in size, still open but not draining.      Impression & Recommendations:  Problem # 1:  CELLULITIS AND ABSCESS OF OTHER SPECIFIED SITE [id number]) improved.  complete total course of bactrim.  return if areas are still firm after completing all of antibiotics.   Her updated  medication list for this problem includes:    Bactrim Ds 800-160 Mg Tabs (Sulfamethoxazole-trimethoprim) .Marland Kitchen... 2 tablets by mouth twice a day for 10 days.   Problem # 2:  DIABETES MELLITUS, II, COMPLICATIONS (ICD-250.92) will give 2 month's worth of samples since pt states this is difficult for her to afford.  Would consider d/c'ing lantus if she continues to lose weight and has better control, however, today's HbA1c is very high.  pt also with recent infection.   hopefully we can get her back under control after this infection clears.     Her updated medication list for this problem includes:    Actos 30 Mg Tabs (Pioglitazone hcl) .Marland Kitchen... Take 1 tablet by mouth once a day    Aspirin Ec 81 Mg Tbec (Aspirin) .Marland Kitchen... Take 1 tablet by mouth once a day    Hyzaar 100-25 Mg Tabs (Losartan potassium-hctz) .Marland Kitchen... Take 1 tablet by mouth once a day    Lantus 100 Unit/ml Soln (Insulin glargine) .Marland KitchenMarland KitchenMarland KitchenMarland Kitchen 10 unit subcutaneously once a day    Metformin Hcl 500 Mg Tabs (Metformin hcl) .Marland Kitchen... Take 1 tablet by mouth twice a day  Orders: A1C-[facility] ([address]) Basic Met-[facility] ([address])   Complete Medication List: 1)  Actos 30 Mg  Tabs (Pioglitazone hcl) .... Take 1 tablet by mouth once a day 2)  Aspirin Ec 81 Mg Tbec (Aspirin) .... Take 1 tablet by mouth once a day 3)  Clotrimazole Anti-fungal 1 % Crea (Clotrimazole) .... Apply liberally to affected area twice a day 4)  Hydrocortisone 2.5 % Crea (Hydrocortisone) .... Apply a small amount to affected area twice a day 5)  Hyzaar 100-25 Mg Tabs (Losartan potassium-hctz) .... Take 1 tablet by mouth once a day 6)  Lantus 100 Unit/ml Soln (Insulin glargine) .Marland Kitchen.. 10 unit subcutaneously once a day 7)  Metformin Hcl 500 Mg Tabs (Metformin hcl) .... Take 1 tablet by mouth twice a day 8)  Lovastatin 20 Mg Tabs (Lovastatin) .Marland Kitchen.. 1 tablet at bedtime 9)  Bactrim Ds 800-160 Mg Tabs (Sulfamethoxazole-trimethoprim) .... 2 tablets by mouth twice a day for 10 days. 10)  Vicodin 5-500 Mg  Tabs (Hydrocodone-acetaminophen) .Marland Kitchen.. 1-2 tablets every 4-6 hours for pain, will make you drowsy. 11)  Ultram 50 Mg Tabs (Tramadol hcl) .Marland Kitchen.. 1 tablet every 6 hours for pain, will not make you drowsy.   Patient Instructions: 1)  Please schedule a follow-up appointment in 3 months for dm.  2)  call for refills.  3)  continue to finish all of antibiotic.  if area doesn't go completely down in 2 more weeks, come back.  continue to wash with soap and water.      Prescriptions: LOVASTATIN 20 MG TABS (LOVASTATIN) 1 tablet at bedtime  #30 x 6   Entered and Authorized by:   Wilhemina Bonito  MD   Signed by:   Wilhemina Bonito  MD on 03/27/2007   Method used:   Electronically sent to ...       [facility] [address] [phone]*       [address]       [place], [place]  [address]       Ph: [phone] or [phone]       Fax: [phone]   RxID:   [id number]  ] Laboratory Results   Blood Tests   Date/Time Received: March 27, 2007 8.51 AM  Date/Time Reported: March 27, 2007 9:04 AM   HGBA1C: 11.2%   (Normal Range: Non-Diabetic - 3-6%   Control Diabetic - 6-8%)  Comments: ............test performed by...........Marland Kitchen Terese Door, CMA

## 2010-06-14 NOTE — Miscellaneous (Signed)
Summary: ROI  ROI   Imported By: Bradly Bienenstock 09/27/2009 16:43:32  _____________________________________________________________________  External Attachment:    Type:   Image     Comment:   External Document

## 2010-06-14 NOTE — Assessment & Plan Note (Signed)
Summary: cold s/s & a boil   Vital Signs:  Patient Profile:   55 Years Old Female Height:     66 inches Weight:      198.9 pounds Temp:     97.9 degrees F Pulse rate:   96 / minute BP sitting:   140 / 82  (left arm)  Vitals Entered By: Theresia Lo RN (April 22, 2007 9:05 AM)             Is Patient Diabetic? Yes      Chief Complaint:  boil rt breast, between legs, cold symptoms, HA, and nausea.  History of Present Illness: Pt is 55 yo woman w/ hx of MRSA breast abscess last month who returns to office today w/ pain, swelling, and redness of R breast.  Pt also has small pustule on R rib under R breast.  This area is not painful per pt report.  Pt also states she feels a small swelling in her R groin, right in the area of her panty line, but doesn't see a bump or pustule.  Pt denies fevers, chills, but did wake up today w/ nausea.  Current Allergies: ! ACE INHIBITORS ! * STATINS     Review of Systems      See HPI   Physical Exam  General:     Well-developed,well-nourished,in no acute distress; alert,appropriate and cooperative throughout examination Chest Wall:     Small 0.5 cm pustule on R rib where R breast makes contact.  No surrounding erythema.  Slight induration.  No palpable fluid collection. Breasts:     R breast w/ 2.5 cm area of ulceration, surrounding erythema and induration.  Mildly warm to the touch.  No palpable fluid collection.  Painful to touch. Genitalia:     Small ( ~0.5cm) area of firmness located along groin in area of R panty line.  No overlying skin changes.  Not tender to palpation.    Impression & Recommendations:  Problem # 1:  CELLULITIS AND ABSCESS OF OTHER SPECIFIED SITE [id number]) Assessment: Deteriorated Pt w/ R breast cellulitis.  No obvious abscess at this time or area to drain or culture.  Small area on R rib where breast makes contact on chest wall.  Pt has hx of MRSA sensitive to Bactrim.  Will prescribe another 10 day  course.  Pt advised to use warm compresses to the areas and then apply Desitin barrier ointment under both breasts to prevent irritation from sweat or friction.  Area in groin may be early sebaceous cyst or early infection but w/out overlying skin changes will not treat.  Advised warm compresses to this area as well.  Pt in agreement w/ plan.  Of note- pt was getting ready to leave when she began crying.  Would not explain why or tell me what was wrong.  Pt left office in a hurry stating she was 'fine'.  Will need f/u at next visit.  Pt to return in 3 days for re-eval. Her updated medication list for this problem includes:    Bactrim Ds 800-160 Mg Tabs (Sulfamethoxazole-trimethoprim) .Marland Kitchen... 2 tablets by mouth twice a day for 10 days.  Orders: [facility]- Est Level  3 ([address])   Complete Medication List: 1)  Actos 30 Mg Tabs (Pioglitazone hcl) .... Take 1 tablet by mouth once a day 2)  Aspirin Ec 81 Mg Tbec (Aspirin) .... Take 1 tablet by mouth once a day 3)  Clotrimazole Anti-fungal 1 % Crea (Clotrimazole) .... Apply liberally to  affected area twice a day 4)  Hydrocortisone 2.5 % Crea (Hydrocortisone) .... Apply a small amount to affected area twice a day 5)  Hyzaar 100-25 Mg Tabs (Losartan potassium-hctz) .... Take 1 tablet by mouth once a day 6)  Lantus 100 Unit/ml Soln (Insulin glargine) .Marland Kitchen.. 10 unit subcutaneously once a day 7)  Metformin Hcl 500 Mg Tabs (Metformin hcl) .... Take 1 tablet by mouth twice a day 8)  Lovastatin 40 Mg Tabs (Lovastatin) .Marland Kitchen.. 1 tablet at bedtime 9)  Bactrim Ds 800-160 Mg Tabs (Sulfamethoxazole-trimethoprim) .... 2 tablets by mouth twice a day for 10 days. 10)  Vicodin 5-500 Mg Tabs (Hydrocodone-acetaminophen) .Marland Kitchen.. 1-2 tablets every 4-6 hours for pain, will make you drowsy. 11)  Ultram 50 Mg Tabs (Tramadol hcl) .Marland Kitchen.. 1 tablet every 6 hours for pain, will not make you drowsy.   Patient Instructions: 1)  Follow up in 3 days w/ Dr Sandria Manly or Dr Beverely Low 2)  Use hot compresses to  the areas to draw out the infection 3)  Use Desitin diaper ointment to serve as a barrier to sweat and irritation on both your breasts- apply every day after the warm compresses 4)  Take the antibiotics twice a day for 10 days 5)  If you develop fevers or the area looks worse- call for an appt before 3 days    Prescriptions: BACTRIM DS 800-160 MG  TABS (SULFAMETHOXAZOLE-TRIMETHOPRIM) 2 tablets by mouth twice a day for 10 days.  #40 x 0   Entered and Authorized by:   Neena Rhymes  MD   Signed by:   Neena Rhymes  MD on 04/22/2007   Method used:   Electronically sent to ...       [facility] [address] [phone]*       [address]       [place], [place]  [address]       Ph: [phone] or [phone]       Fax: [phone]   RxID:   [id number]  ]

## 2010-06-14 NOTE — Assessment & Plan Note (Signed)
Summary: f/u visit/labs/bmc   Vital Signs:  Patient Profile:   55 Years Old Female Height:     66 inches Weight:      200.6 pounds Temp:     98.2 degrees F Pulse rate:   76 / minute BP sitting:   139 / 84  (left arm)  Pt. in pain?   no  Vitals Entered By: Jacki Cones RN (June 05, 2007 8:57 AM)                  Chief Complaint:  f/u and labs.  History of Present Illness: pt has not been able to exercerise regularly since she had the multiple abscesses, but working out now, 1 hour 4 days out of the week M, T Th, Sat, aerobics classes.    Boils have healed up.  pt did have mild renal failure with bactrim DS 2 tabs two times a day which was used for her MRSA coverage.  this resolved with discontinuation of the medication.  no more recurrent issues with mrsa.  used nasal bactroban.     Pt is here today for follow up of diabetes.    DM:on Lantus 10, actos 30 and Metformin 500mg  two times a day  Home sugars ranging:CBG this AM 147.  Using meds:yes Leg/foot pain:no Last Creatinine:0.99 Last HbA1C:10 (but this was while she was infected) Recent infections:yes  Pt is here today for follow up Hypertension.    HTN: stable Using medications:yes Chest Pain: no SOB:no HA:no Leg Edema:no Previous hx of CVA/TIA:no    Current Allergies: ! ACE INHIBITORS ! * STATINS  Past Medical History:    Reviewed history from 07/12/2006 and no changes required:       6/02 suspect rt knee meniscal tear, 10/2000 - 1+ microalbumin,        Carpal Tunnel Syndrome, Gonorrhea  11-99  Past Surgical History:    Reviewed history from 07/12/2006 and no changes required:       6/01 hysterectomy for fibroids; still with ovary - 12/30/1999, Depo-Lupron 03-01 - 08/20/1999   Family History:    Reviewed history from 07/12/2006 and no changes required:       2 Sisters-Both have DM, Father-CVA, Mom died age 75-Heart Disease  Social History:    Reviewed history from 07/12/2006 and no changes  required:       Single, nonsmoker; son - Remi Deter; works at [employer];  occasional etoh, no cigs, no drug use;     Physical Exam  General:     Well-developed,well-nourished,in no acute distress; alert,appropriate and cooperative throughout examination Head:     normocephalic and atraumatic.   Eyes:     vision grossly intact, pupils equal, pupils round, and pupils reactive to light.   Nose:     no external deformity.   Mouth:     good dentition.   Neck:     No deformities, masses, or tenderness noted. Lungs:     Normal respiratory effort, chest expands symmetrically. Lungs are clear to auscultation, no crackles or wheezes. Heart:     Normal rate and regular rhythm. S1 and S2 normal without gallop, murmur, click, rub or other extra sounds. Abdomen:     Bowel sounds positive,abdomen soft and non-tender without masses, organomegaly or hernias noted. Skin:     Intact without suspicious lesions or rashes, mild keloid scars resultant from previous boils that have healed.  Psych:     Oriented X3, normally interactive, good eye contact, not anxious appearing, and  not depressed appearing.      Impression & Recommendations:  Problem # 1:  HYPERTENSION, BENIGN SYSTEMIC (ICD-401.1) stable.  continue current meds.  encouraged pt to keep exercising and weight loss.  pt is very excited to start back working out.   Her updated medication list for this problem includes:    Hyzaar 100-25 Mg Tabs (Losartan potassium-hctz) .Marland Kitchen... 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  recheck hba1c now since not infected.  goal is to get rid of lantus if she loses enough weight. 9.1 today, improving with clearing infection.  will also encourage diet and exercise.  Her updated medication list for this problem includes:    Actos 30 Mg  Tabs (Pioglitazone hcl) .Marland Kitchen... Take 1 tablet by mouth once a day    Aspirin Ec 81 Mg Tbec (Aspirin) .Marland Kitchen... Take 1 tablet by mouth once a day    Hyzaar 100-25 Mg Tabs (Losartan potassium-hctz) .Marland Kitchen... Take 1 tablet by mouth once a day    Lantus 100 Unit/ml Soln (Insulin glargine) .Marland KitchenMarland KitchenMarland KitchenMarland Kitchen 10 unit subcutaneously once a day    Metformin Hcl 1000 Mg Tabs (Metformin hcl) .Marland Kitchen... Take 1 tablet by mouth two times a day  Orders: [facility]- Est  Level 4 (99214) A1C-[facility] (60454)   Problem # 3:  CELLULITIS AND ABSCESS OF OTHER SPECIFIED SITE [id number]) resolved Orders: [facility]- Est  Level 4 (14782)   Problem # 4:  RENAL FAILURE, MILD (ICD-586) resolved Orders: [facility]- Est  Level 4 (95621)   Problem # 5:  OBESITY, NOS (ICD-278.00) pt is returning to gym 4 x a week.   Complete Medication List: 1)  Actos 30 Mg Tabs (Pioglitazone hcl) .... Take 1 tablet by mouth once a day 2)  Aspirin Ec 81 Mg Tbec (Aspirin) .... Take 1 tablet by mouth once a day 3)  Clotrimazole Anti-fungal 1 % Crea (Clotrimazole) .... Apply liberally to affected area twice a day 4)  Hydrocortisone 2.5 % Crea (Hydrocortisone) .... Apply a small amount to affected area twice a day 5)  Hyzaar 100-25 Mg Tabs (Losartan potassium-hctz) .... Take 1 tablet by mouth once a day 6)  Lantus 100 Unit/ml Soln (Insulin glargine) .Marland Kitchen.. 10 unit subcutaneously once a day 7)  Metformin Hcl 1000 Mg Tabs (Metformin hcl) .... Take 1 tablet by mouth two times a day 8)  Lovastatin 40 Mg Tabs (Lovastatin) .Marland Kitchen.. 1 tablet at bedtime   Patient Instructions: 1)  Please schedule a follow-up appointment in 3 months. 2)  workout hard!! 3)  aim for no more than 2lbs per week, first week or two you may lose 4-5 lbs.   4)  increase your metformin to 2 tablets in AM and PM, then when you your other prescription go back to 1 twice a day. drink plenty of water.      Prescriptions: METFORMIN HCL 1000 MG TABS (METFORMIN HCL) Take 1 tablet by mouth two times a day  #60 x 6    Entered and Authorized by:   Wilhemina Bonito  MD   Signed by:   Wilhemina Bonito  MD on 06/05/2007   Method used:   Print then Give to Patient   RxID:   [id number]  ] Laboratory Results   Blood Tests   Date/Time Received: June 05, 2007 9:17 AM  Date/Time Reported:  June 05, 2007 9:48 AM   HGBA1C: 9.1%   (Normal Range: Non-Diabetic - 3-6%   Control Diabetic - 6-8%)  Comments: ...............test performed by ...............Marland KitchenDelores Pate-Gaddy, CMA

## 2010-06-14 NOTE — Letter (Signed)
Summary: Generic Letter  [facility]  [address]   [place], [place] [address]   Phone: [phone]  Fax: [phone]    12/31/2006 MRN: [id number]  [address] [place], [place]  [address]  Dear Ms. MILLER,   Your blood work is attached.  Your cholesterol is currently at goal.  I would continue your current medications.  Your electrolytes, kidney, and liver functions all look well.  Your glucose or sugar is good for an AM fasting level at 115.  I am proud of your success with your diabetes and lowering your HbA1c a whole point!!! Continue to lose the weight. Congrats!  Keep up the good work!  Please follow up as previously scheduled.    Sincerely,      MELISSA LOVE  MD [facility]

## 2010-06-14 NOTE — Assessment & Plan Note (Signed)
Summary: f/u DM, HTN,df   Vital Signs:  Patient profile:   55 year old female Weight:      212.3 pounds Temp:     98.4 degrees F Pulse rate:   88 / minute Pulse rhythm:   regular BP sitting:   124 / 81  (left arm) Cuff size:   regular  Vitals Entered By: Loralee Pacas CMA (October 29, 2009 2:08 PM)  Primary Care Provider:  Delbert Harness MD  CC:  follow-up HTN/DM.  History of Present Illness: 55 yo here for follow-up  HYPERTENSION Meds: Taking and tolerating? yes Home BP's: takes at home Chest Pain: no Dyspnea: no  DIABETES Meds: Metformin 2000 mg two times a day and Lantus 25 units QHS Taking and tolerating? yes Blood sugars: < 100 fasting, 5pm 90's, before bed, 120's. Hypoglycemic symptoms: No Visual problems: No Monitoring feet: yes Numbness/Tingling:  no Last eye exam: June 2011 Last A1c: 8.8 12/2009  HYPERLIPIDEMIA Meds: Lovastatin Taking and tolerating? yes Diet pattern: no diet Exercise: started zumba classes twice weekly  shoulder pain and neck pain:  completed physical therapy, pain much improved.   Current Medications (verified): 1)  Aspirin Ec 81 Mg Tbec (Aspirin) .... Take 1 Tablet By Mouth Once A Day 2)  Lantus 100 Unit/ml Soln (Insulin Glargine) .... 25 Units Subcutaneously Qam - Disp 30 Day Supply - Place Script On Hold 3)  Metformin Hcl 1000 Mg Tabs (Metformin Hcl) .... Take 2 Tablet By Mouth Two Times A Day With Food 4)  Hyzaar 100-25 Mg Tabs (Losartan Potassium-Hctz) .... Take 1 Tablet By Mouth Once A Day.  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 1 Tablet At Bedtime - Place Script On Hold 6)  Flexeril 10 Mg Tabs (Cyclobenzaprine Hcl) .... 1/2 To 1 Tablet By Mouth At Bedtime As Needed For Shoulder Pain - Do Not Drive or Drink Alcohol While Taking  Allergies: 1)  ! Ace Inhibitors 2)  ! * Statins PMH-FH-SH reviewed for relevance  Review of Systems      See HPI General:  Denies fever  and weight loss. Eyes:  Denies blurring. CV:  Denies chest pain or discomfort, palpitations, shortness of breath with exertion, and swelling of feet. Resp:  Denies cough and shortness of breath. GI:  Denies abdominal pain, bloody stools, and change in bowel habits. MS:  Denies joint pain. Endo:  Denies excessive thirst and excessive urination.  Physical Exam  General:  Overweight-appearing, no acute distress.  Alert and oriented x 3.  Lungs:  Clear to auscultation bilaterally.  Normal work of breathing.  No wheezes, rales, or rhonchi.  Heart:  Regular rate and rhythm.  No murmur, rub, or gallop.  2+ Dorsalis Pedis pulses.  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  Microalbumin negative.  Encouraged continued improvement in diet and exercise.  Patient doing well with Lantus.  Follow-up in 3 months.    Her updated medication list for this problem includes:    Aspirin Ec 81 Mg Tbec (Aspirin) .Marland Kitchen... Take 1 tablet by mouth once a day    Lantus 100 Unit/ml Soln (Insulin glargine) .Marland Kitchen... 25 units subcutaneously qam - disp 30 day supply - place script on hold    Metformin Hcl 1000 Mg Tabs (Metformin hcl) .Marland Kitchen... Take 2 tablet by mouth two times a day with food    Hyzaar 100-25 Mg Tabs (Losartan potassium-hctz) .Marland Kitchen... Take 1 tablet by mouth once a day.  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  Her updated medication list for this problem includes:    Hyzaar 100-25 Mg Tabs (Losartan potassium-hctz) .Marland Kitchen... Take 1 tablet by mouth once a day.  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 1 tablet at bedtime - place script on hold  Orders: [facility]- Est  Level 4 (91478)  Labs Reviewed: SGOT: 30 (11/30/2008)   SGPT: 30 (11/30/2008)   HDL:77 (11/30/2008), 71 (05/28/2008)  LDL:131 (11/30/2008), 91 (29/56/2130)  Chol:223 (11/30/2008), 173 (05/28/2008)  Trig:53 (05/28/2008), 84 (03/03/2008)  Problem # 4:  ANEMIA, IRON DEFICIENCY, UNSPEC. (ICD-280.9)  Improved but slightly low.  Continue iron supplements.   Orders: Hemoglobin-[facility] (86578)  Hgb: 11.5 (10/29/2009)   Hct: 38.7 (04/02/2009)   Platelets: 297 (04/02/2009) RBC: 4.65 (04/02/2009)   RDW: 14.6 (04/02/2009)   WBC: 4.5 (04/02/2009) MCV: 83.2 (04/02/2009)   MCHC: 30.0 (04/02/2009) TSH: 1.680 (05/28/2008)  Complete Medication List: 1)  Aspirin Ec 81 Mg Tbec (Aspirin) .... Take 1 tablet by mouth once a day 2)  Lantus 100 Unit/ml Soln (Insulin glargine) .... 25 units subcutaneously qam - disp 30 day supply - place script on hold 3)  Metformin Hcl 1000 Mg Tabs (Metformin hcl) .... Take 2 tablet by mouth two times a day with food 4)  Hyzaar 100-25 Mg Tabs (Losartan potassium-hctz) .... Take 1 tablet by mouth once a day.  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 1 tablet at  bedtime - place script on hold 6)  Flexeril 10 Mg Tabs (Cyclobenzaprine hcl) .... 1/2 to 1 tablet by mouth at bedtime as needed for shoulder pain - do not drive or drink alcohol while taking  Patient Instructions: 1)  Work on healthy diet- more vegetables fewer sweets! 2)  Keep up Zumba and try to walk on days you don't do Zumba. 3)  take iron tablet daily 4)  Follow up in 3 months or sooner if needed Prescriptions: LANTUS 100 UNIT/ML SOLN (INSULIN GLARGINE) 25 units subcutaneously qam - disp 30 day supply - place script on hold  #1 x 12   Entered and Authorized by:   Delbert Harness MD   Signed by:   Selena Batten  Michelangelo Rindfleisch MD on 11/01/2009   Method used:   Electronically to        [facility]  [address] [phone]* (retail)       [address]       [place], [place]  [address]       Ph: [phone] or [phone]       Fax: [phone]   RxID:   [id number]    Prevention & Chronic Care Immunizations   Influenza vaccine: Fluvax Non-MCR  (02/22/2009)   Influenza vaccine due: 02/22/2010    Tetanus booster: 05/15/2001: Done.   Tetanus booster due: 05/16/2011    Pneumococcal vaccine: Not documented  Colorectal Screening   Hemoccult: Not documented   Hemoccult due: Not Indicated    Colonoscopy: normal  (05/22/2006)   Colonoscopy due: 05/22/2016  Other Screening   Pap smear: normal  (06/15/2008)   Pap smear due: 06/16/2011    Mammogram: 76098.0^MM BREAST SURGICAL SPECIMEN  (09/29/2009)   Mammogram due: 09/15/2009   Smoking status: never  (02/22/2009)  Diabetes Mellitus   HgbA1C: 7.7  (10/29/2009)   Hemoglobin A1C due: 09/07/2008    Eye exam: normal  (10/26/2009)   Eye exam due: 10/2010    Foot exam: yes  (08/05/2008)   High risk foot: Not documented   Foot care education: completed  (12/09/2007)   Foot exam due: 03/03/2009    Urine microalbumin/creatinine ratio: 20  (10/29/2009)   Urine microalbumin/cr due: 10/30/2010    Diabetes flowsheet reviewed?:  Yes   Progress toward A1C goal: Improved  Lipids   Total Cholesterol: 223  (11/30/2008)   LDL: 131  (11/30/2008)   LDL Direct: Not documented   HDL: 77  (11/30/2008)   Triglycerides: 53  (05/28/2008)    SGOT (AST): 30  (11/30/2008)   SGPT (ALT): 30  (11/30/2008)   Alkaline phosphatase: 112  (11/30/2008)   Total bilirubin: 0.2  (11/30/2008)    Lipid flowsheet reviewed?: Yes   Progress toward LDL goal: Unchanged  Hypertension   Last Blood Pressure: 124 / 81  (10/29/2009)   Serum creatinine: 0.82  (05/28/2008)   Serum potassium 4.6  (11/30/2008)    Hypertension flowsheet reviewed?: Yes   Progress toward BP goal: At goal  Self-Management Support :   Personal Goals (by the next clinic visit) :     Personal A1C goal: 7  (10/29/2009)     Personal blood pressure goal: 130/80  (10/29/2009)     Personal LDL goal: 100  (10/29/2009)    Patient will work on the following items until the next clinic visit to reach self-care goals:     Medications and monitoring: take my medicines every day, check my blood sugar, check my blood pressure, weigh myself weekly  (10/29/2009)     Eating: drink diet soda or water instead of juice or soda, eat more vegetables, use fresh or frozen vegetables, eat foods that are low in salt, eat baked foods instead of fried foods  (10/29/2009)     Activity: take a 30 minute walk every day  (10/29/2009)    Diabetes self-management support: Copy of home glucose meter record  (02/22/2009)    Hypertension self-management support: Written self-care plan  (10/29/2009)   Hypertension self-care plan printed.    Lipid self-management support: Not documented    Laboratory Results   Urine Tests  Date/Time Received: October 29, 2009 2:59 PM  Date/Time Reported: October 29, 2009 3:12 PM   Microalbumin (urine): 10 mg/L  Creatinine: 50mg /dL  A:C Ratio <84 Normal Comments: ...............test performed by......Marland KitchenBonnie A. [place], MLS (ASCP)cm   Blood Tests   Date/Time  Received: October 29, 2009 2:12 PM  Date/Time Reported: October 29, 2009 2:46 PM   HGBA1C: 7.7%   (Normal Range: Non-Diabetic - 3-6%   Control Diabetic - 6-8%)   CBC   HGB:  11.5 g/dL   (Normal Range: 69.6-29.5 in Males, 12.0-15.0 in Females) Comments: capillary sample ...............test performed by......Marland KitchenBonnie A. [place], MLS (ASCP)cm      Last Flu Vaccine:  Fluvax Non-MCR (02/22/2009 4:04:55 PM) Flu Vaccine Result Date:  02/22/2009 Flu Vaccine Next Due:  1 yr

## 2010-06-14 NOTE — Assessment & Plan Note (Signed)
Summary: f/u MRSA boils   Vital Signs:  Patient Profile:   55 Years Old Female Height:     66 inches Weight:      193.8 pounds Pulse rate:   80 / minute BP sitting:   120 / 74  (right arm)  Pt. in pain?   no  Vitals Entered By: Arlyss Repress CMA, (May 03, 2007 8:33 AM)                  Chief Complaint:  f/up last OV and boils.  History of Present Illness: Pt is doing much better after I & D of multiple abscesses.  Pt has continued to take the Bactrim throughout this week even though she was counseled about stopping it 3 days after the I and D.  Pt also used the Bactroban in her nose.  Pt has been going to work, but not exercising.    Current Allergies: ! ACE INHIBITORS ! * STATINS  Past Medical History:    Reviewed history from 07/12/2006 and no changes required:       6/02 suspect rt knee meniscal tear, 10/2000 - 1+ microalbumin, Carpal Tunnel Syndrome, Gonorrhea  11-99  Past Surgical History:    Reviewed history from 07/12/2006 and no changes required:       6/01 hysterectomy for fibroids; still with ovary - 12/30/1999, Depo-Lupron 03-01 - 08/20/1999   Family History:    Reviewed history from 07/12/2006 and no changes required:       2 Sisters-Both have DM, Father-CVA, Mom died age 62-Heart Disease  Social History:    Reviewed history from 07/12/2006 and no changes required:       Single, nonsmoker; son - Warehouse manager; works at [employer];  occasional etoh, no cigs, no drug use;     Physical Exam  General:     Well-developed,well-nourished,in no acute distress; alert,appropriate and cooperative throughout examination Skin:     healing incision on R breast, still with some good granulation tissue,  one on chest is healed completely, and in groin, healed completely, no pus.      Impression & Recommendations:  Problem # 1:  CELLULITIS AND ABSCESS OF OTHER SPECIFIED SITE [id number]) Improved with i and D.  Stop anitbiotic.  Continue two times a day dressing  changes but use a bandaid.  Ok to resume exercise.   The following medications were removed from the medication list:    Bactrim Ds 800-160 Mg Tabs (Sulfamethoxazole-trimethoprim) .Marland Kitchen... 2 tablets by mouth twice a day for 10 days.  Orders: [facility]- Est Level  3 (04540)   Problem # 2:  RENAL FAILURE, MILD (ICD-586) check creatinine today due to mild renal failure associated with Bactrim DS 2 tabs two times a day. D/c antibiotics today.  Push fluids.   Orders: Basic Met-[facility] ([id number]) [facility]- Est Level  3 (95621)   Complete Medication List: 1)  Actos 30 Mg Tabs (Pioglitazone hcl) .... Take 1 tablet by mouth once a day 2)  Aspirin Ec 81 Mg Tbec (Aspirin) .... Take 1 tablet by mouth once a day 3)  Clotrimazole Anti-fungal 1 % Crea (Clotrimazole) .... Apply liberally to affected area twice a day 4)  Hydrocortisone 2.5 % Crea (Hydrocortisone) .... Apply a small amount to affected area twice a day 5)  Hyzaar 100-25 Mg Tabs (Losartan potassium-hctz) .... Take 1 tablet by mouth once a day 6)  Lantus 100 Unit/ml Soln (Insulin glargine) .Marland Kitchen.. 10 unit subcutaneously once a day 7)  Metformin Hcl 500 Mg  Tabs (Metformin hcl) .... Take 1 tablet by mouth twice a day 8)  Lovastatin 40 Mg Tabs (Lovastatin) .Marland Kitchen.. 1 tablet at bedtime 9)  Vicodin 5-500 Mg Tabs (Hydrocodone-acetaminophen) .Marland Kitchen.. 1-2 tablets every 4-6 hours for pain, will make you drowsy. 10)  Ultram 50 Mg Tabs (Tramadol hcl) .Marland Kitchen.. 1 tablet every 6 hours for pain, will not make you drowsy. 11)  Bactroban Nasal 2 % Oint (Mupirocin calcium) .... Apply to both nostrils twice a day for 5 days for mrsa infection. disp 1 tube   Patient Instructions: 1)  Stop the antibiotic.  2)  Continue to put a bandaid each on the breast, and apply neosporin in the AM.  Air out at night.  3)  We'll check your kidney's today.   4)  Follow up in 1 month.     ]

## 2010-06-14 NOTE — Assessment & Plan Note (Signed)
Summary: f/u illness/bmc   Vital Signs:  Patient Profile:   55 Years Old Female Height:     66 inches Weight:      193 pounds Temp:     98.1 degrees F Pulse rate:   86 / minute BP sitting:   125 / 77  Vitals Entered By: Dennison Nancy RN (April 26, 2007 5:02 PM)                 Procedure Note Last Tetanus: Done. (05/15/2001)  Cyst Removal: Date of onset: 04/22/2007 Indication: infected lesion  Procedure #1: elliptical incision and removal w/blunt dissection    Size (in cm): 2.0 x 3.0    Location: L upper thigh    Comment: packed with 5 inches of iodoform gauze.     Instrument used: #11 blade    Anesthesia: 1% lidocaine w/epinephrine    Closure: none  Incision & Drainage: The patient complains of pain, redness, irritation, inflammation, tenderness, and swelling. Date of onset: 04/22/2007 Indication: infected lesion  Procedure # 1: I & D with packing    Size (in cm): 2.0 x 1.0    Location: L breast    Instrument used: #11 blade    Anesthesia: 1% lidocaine w/epinephrine    Closure: no  Procedure # 2: I & D    Size (in cm): .5 x .5    Location: L chest    Instrument used: #11 blade    Anesthesia: 1% lidocaine w/epinephrine    Closure: none  Cleaned and prepped with: betadine Wound dressing: bulky gauze dressing Instructions: daily dressing changes and RTC in 7-10 days   Chief Complaint:  follow up multiple abscesses and painful still.  History of Present Illness: pt was seen by dr Beverely Low 3 days ago for 3 abscesses, 1 on L breast, 1 "kissing lesion" on L chest, and one on L upper thigh.  Pt did not want these I &D'ed when she was seen before, so she used hot compresses and used bactrim ds 2 tab by mouth two times a day  as prescribed.  one on L breast is draining but very tender to slightest touch.  pt willing to have I & D performed today on all 3 lesions.  verbal consent obtained.    Current Allergies: ! ACE INHIBITORS ! * STATINS      Physical  Exam  General:     Well-developed,well-nourished,in no acute distress; alert,appropriate and cooperative throughout examination Skin:     noted areas of L chest wall and L breast with draining pustule lesions, and area of infected sebacous cyst on L upper thigh, tender to palpation.      Impression & Recommendations:  Problem # 1:  CELLULITIS AND ABSCESS OF OTHER SPECIFIED SITE [phone] s/p I & D.  Will decrease to 1 tab by mouth two times a day of bactrim due to concern for renal insuff.  will treat with bacroban nasal.  use dial soap.  follow up in 1 week.  has plenty of previous pain meds from before.  may call on monday if needing note for work.   Her updated medication list for this problem includes:    Bactrim Ds 800-160 Mg Tabs (Sulfamethoxazole-trimethoprim) .Marland Kitchen... 2 tablets by mouth twice a day for 10 days.  Orders: [facility]- Est Level  3 (99213) Culture, Wound -[facility] (16073) Gram Stain-[facility] ([id number])   Complete Medication List: 1)  Actos 30 Mg Tabs (Pioglitazone hcl) .... Take 1 tablet by mouth once a day  2)  Aspirin Ec 81 Mg Tbec (Aspirin) .... Take 1 tablet by mouth once a day 3)  Clotrimazole Anti-fungal 1 % Crea (Clotrimazole) .... Apply liberally to affected area twice a day 4)  Hydrocortisone 2.5 % Crea (Hydrocortisone) .... Apply a small amount to affected area twice a day 5)  Hyzaar 100-25 Mg Tabs (Losartan potassium-hctz) .... Take 1 tablet by mouth once a day 6)  Lantus 100 Unit/ml Soln (Insulin glargine) .Marland Kitchen.. 10 unit subcutaneously once a day 7)  Metformin Hcl 500 Mg Tabs (Metformin hcl) .... Take 1 tablet by mouth twice a day 8)  Lovastatin 40 Mg Tabs (Lovastatin) .Marland Kitchen.. 1 tablet at bedtime 9)  Bactrim Ds 800-160 Mg Tabs (Sulfamethoxazole-trimethoprim) .... 2 tablets by mouth twice a day for 10 days. 10)  Vicodin 5-500 Mg Tabs (Hydrocodone-acetaminophen) .Marland Kitchen.. 1-2 tablets every 4-6 hours for pain, will make you drowsy. 11)  Ultram 50 Mg Tabs (Tramadol hcl) .Marland Kitchen.. 1  tablet every 6 hours for pain, will not make you drowsy. 12)  Bactroban Nasal 2 % Oint (Mupirocin calcium) .... Apply to both nostrils twice a day for 5 days for mrsa infection. disp 1 tube  Other Orders: Basic Met-[facility] ([id number]) I&D Abcess, simple- [facility] (10060)   Patient Instructions: 1)  Decrease your bactrim (trimethaprim-sulfoxide) to 1 tablet twice a day  for the next week.   2)  wash your body with dial soap twice a day  3)  pull packing on Sunday, if falls out before, ok to leave out. 4)  apply dressing to areas twice a day as needed for drainage and protection. 5)  apply bactraban to inside of nose twice a day for the next 5 days.  6)  follow up with Dr. Love next Friday, ok to double book.      Prescriptions: BACTROBAN NASAL 2 %  OINT (MUPIROCIN CALCIUM) apply to both nostrils twice a day for 5 days for MRSA infection. disp 1 tube  #1 x 0   Entered and Authorized by:   MELISSA LOVE  MD   Signed by:   MELISSA LOVE  MD on 04/26/2007   Method used:   Electron[facility] to ...       [facility]  [address] [id number]       [address]       [place], [place]  [address]       Ph: [phone] or [phone]       Fax: [phone]   RxID:   [id number]  ]

## 2010-06-14 NOTE — Assessment & Plan Note (Signed)
Summary: f/u,df   Vital Signs:  Patient profile:   55 year old female Height:      66 inches Weight:      206.4 pounds BMI:     33.43 Temp:     97.8 degrees F oral Pulse rate:   93 / minute BP sitting:   113 / 76  (left arm) Cuff size:   large  Vitals Entered By: Garen Grams LPN (December 28, 2008 4:19 PM) CC: f/u dm   Primary Care Benito Lemmerman:  [name] MD  CC:  f/u dm.  History of Present Illness: Diabetes:  since july 20, added metformin 2000 mg twice a day by RNP at work.  Has noticed improvement in blood sugars.  Per meter: 7 day average 113, 14 day average 123, 30 day average 145.  per pt report: Average morning blood sugar: 113 After work about 5PM: 80's-90's. Before bed: 154 last night which is average. Lantus- titrated up to 30 nightly. No hypoglycemia. Denies numbness, tingling in hands when hypoglycemic but goes away.  Checks feet.  Obesity: 24 hour recall Sunday morning: dan-active yogurt [employer]: cheeseburger and small fries PM: tomato sandwich with mayo, on white wheat bread. Ate a plum for an evening snack.  And 5 crackers. Only drinks were diet coke.  Nausea:  for the past few weeks, nausea comes and goes.  Mostly happens when takes medications on an empty stomach.  No abd pain.  improved when eating lunch.      HYPERTENSION Meds: Taking and tolerating? Home BP's: no Chest Pain: no Dyspnea: no   Allergies: 1)  ! Ace Inhibitors 2)  ! * Statins PMH-FH-SH reviewed-no changes except otherwise noted, PMH-FH-SH reviewed for relevance  Review of Systems      See HPI General:  Denies fever, loss of appetite, and weight loss. CV:  Denies chest pain or discomfort, palpitations, shortness of breath with exertion, and swelling of feet. Resp:  Denies cough and shortness of breath. GI:  Complains of nausea; denies abdominal pain, constipation, diarrhea, and vomiting.  Physical Exam  General:  Overweight-appearing, no acute distress.  Alert and oriented  x 3.  Lungs:  Clear to auscultation bilaterally.  Normal work of breathing.  No wheezes, rales, or rhonchi.  Heart:  Regular rate and rhythm.  No murmur, rub, or gallop.  2+ Dorsalis Pedis pulses.  Abdomen:  soft, non-tender, normal bowel sounds, no distention, no masses, no guarding, no rigidity, no rebound tenderness, no abdominal hernia, no inguinal hernia, no hepatomegaly, and no splenomegaly.  C/S scar intact without hernia. Extremities:  No clubbing, cyanosis, or edema.    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  Still above goal.  Patient's CBG log per report not coordinating ith high A1C.  Will not make any changes to Lantus at this time due to recent titration and lows of 80's.  Woudl expect next check to have even more improvement given relatively recent med changes.  Her updated medication list for this problem includes:    Aspirin Ec 81 Mg Tbec (Aspirin) .Marland Kitchen... Take 1 tablet by mouth once a day    Lantus 100 Unit/ml Soln (Insulin glargine) .Marland Kitchen... 25 units subcutaneously qam - disp 30 day supply - place script on hold    Metformin Hcl 1000 Mg Tabs (Metformin hcl) .Marland Kitchen... Take 2 tablet by mouth two times a day with food    Hyzaar 100-25 Mg Tabs (Losartan  potassium-hctz) .Marland Kitchen... Take 1 tablet by mouth once a day -  - place script on hold  Orders: A1C-[facility] (98119) [facility]- Est  Level 4 (14782)  Labs Reviewed: Creat: 0.82 (05/28/2008)   Microalbumin: negative (03/03/2008)  Last Eye Exam: normal (10/18/2007) Has one 10/2008- will get records Reviewed HgBA1c results: 10.5 (09/09/2008)  13.1 (06/09/2008)  Problem # 2:  OBESITY, NOS (ICD-278.00)  4 pound weight gain.  24 hour recall looks like not many calories but high fat foods.  I suspect she is omitting many snacks and during out recall she kept adding additional snacks.  She does not want referall to nutritionist at this time.  sked her to  very strictly fill out a 3 day foor diary and return to clininc in 1 month for further counseling.   Orders: [facility]- Est  Level 4 (95621)  Problem # 3:  HYPERTENSION, BENIGN SYSTEMIC (ICD-401.1)  Well controlled.  Her updated medication list for this problem includes:    Hyzaar 100-25 Mg Tabs (Losartan potassium-hctz) .Marland Kitchen... Take 1 tablet by mouth once a day -  - place script on hold  Orders: [facility]- Est  Level 4 (30865)  Problem # 4:  NAUSEA (ICD-787.02)  Likely its' taking meds on an empty stomach but could also be a component of gastroparesis vs GERD.  Is sporadic at this time.  No clinical signs or symptoms of hepatobiliary disease.  Asked patietn to take meds with food and will follow-up.  Orders: [facility]- Est  Level 4 (99214)  Complete Medication List: 1)  Aspirin Ec 81 Mg Tbec (Aspirin) .... Take 1 tablet by mouth once a day 2)  Lantus 100 Unit/ml Soln (Insulin glargine) .... 25 units subcutaneously qam - disp 30 day supply - place script on hold 3)  Metformin Hcl 1000 Mg Tabs (Metformin hcl) .... Take 2 tablet by mouth two times a day with food 4)  Hyzaar 100-25 Mg Tabs (Losartan potassium-hctz) .... Take 1 tablet by mouth once a day -  - place script on hold 5)  Lovastatin 40 Mg Tabs (Lovastatin) .Marland Kitchen.. 1 tablet at bedtime - place script on hold 6)  Flexeril 10 Mg Tabs (Cyclobenzaprine hcl) .... 1/2 to 1 tablet by mouth at bedtime as needed for shoulder pain - do not drive or drink alcohol while taking 7)  Diclofenac Sodium 75 Mg Tbec (Diclofenac sodium) .Marland Kitchen.. 1 tablet by mouth two times a day with food as needed for shoulder pain 8)  Omeprazole 20 Mg Cpdr (Omeprazole) .Marland Kitchen.. 1 tablet by mouth daily for your stomach  Patient Instructions: 1)  Keep up the good work on exercising- 30 minutes daily x 5 days a week. 2)  Eat greens for lunch and dinner. 3)  Write down everything you eat from waking to sleeping for 3 days straight and bring to next appointment. 4)  make follow-up appt in 1  month. Prescriptions: FLEXERIL 10 MG TABS (CYCLOBENZAPRINE HCL) 1/2 to 1 tablet by mouth at bedtime as needed for shoulder pain - do not drive or drink alcohol while taking  #30 x 1   Entered and Authorized by:   Delbert Harness MD   Signed by:   Delbert Harness MD on 12/28/2008   Method used:   Electronically to        [facility] [address] [phone]* (retail)       [address]       [place], [place]  [address]  Ph: [phone] or [phone]       Fax: [phone]   RxID:   [id number] DICLOFENAC SODIUM 75 MG TBEC (DICLOFENAC SODIUM) 1 tablet by mouth two times a day with food as needed for shoulder pain  #60 x 1   Entered and Authorized by:   Delbert Harness MD   Signed by:   Delbert Harness MD on 12/28/2008   Method used:   Electronically to        [facility] [address] [phone]* (retail)       [address]       [place], [place]  [address]       Ph: [phone] or [phone]       Fax: [phone]   RxID:   [id number]   Laboratory Results   Blood Tests   Date/Time Received: December 28, 2008 4:18 PM  Date/Time Reported: December 28, 2008 4:39 PM   HGBA1C: 8.8%   (Normal Range: Non-Diabetic - 3-6%   Control Diabetic - 6-8%)  Comments: ...............test performed by......Marland KitchenBonnie A. [place], [place] (ASCP)        Appended Document: outside labs,df    Clinical Lists Changes  Observations: Added new observation of HGBA1C: 10.1 % (11/30/2008 17:12) Added new observation of LDL: 131 mg/dL (55/73/2202 54:27) Added new observation of HDL: 77 mg/dL (11/05/7626 31:51) Added new observation of CHOLESTEROL: 223 mg/dL (76/16/0737 10:62) Added new observation of CALCIUM: 9.7 mg/dL (69/48/5462 70:35) Added new observation of ALBUMIN: 4.4 g/dL (00/93/8182 99:37) Added new observation of PROTEIN, TOT: 7.5 g/dL (16/96/7893 81:01) Added new observation of SGPT (ALT): 30 units/L (11/30/2008 17:12) Added new observation of SGOT (AST): 30  units/L (11/30/2008 17:12) Added new observation of ALK PHOS: 112 units/L (11/30/2008 17:12) Added new observation of BILI TOTAL: 0.2 mg/dL (75/02/2584 27:78) Added new observation of CL SERUM: 102 meq/L (11/30/2008 17:12) Added new observation of K SERUM: 4.6 meq/L (11/30/2008 17:12) Added new observation of NA: 139 meq/L (11/30/2008 17:12)

## 2010-06-14 NOTE — Assessment & Plan Note (Signed)
Summary: DNKA               

## 2010-06-14 NOTE — Letter (Signed)
Summary: [facility]  See MD box for hardcopy

## 2010-06-14 NOTE — Assessment & Plan Note (Signed)
Summary: OV/ MEET NEW MD/  DPG   Vital Signs:  Patient Profile:   55 Years Old Female Height:     66 inches Weight:      203.2 pounds Temp:     98.5 degrees F Pulse rate:   90 / minute BP sitting:   117 / 79  (left arm)  Pt. in pain?   no  Vitals Entered By: Theresia Lo RN (December 09, 2007 9:03 AM)              Is Patient Diabetic? Yes      Visit Type:  Routine Visit PCP:  [name] MD  Chief Complaint:  diabetes follow up.  History of Present Illness: 55 yo F with MMP whom I am meeting for the first time for a routine f/u of MMP:  1.  DM - Taking medications without problems, but never started Actos.  Checking home blood sugars - reports elevated recently (fasting 239 this am).  Thinks due to increased stressors in life.  Denies dizziness, shakiness, confusion, polyuria, polydipsia, or nocturia.  Denies recent infection or complications.  Exercising 4 days per week.    2.  HTN - Tolerating Medications without problems.  Denies CP, SOB, edema, HA, light-headeness.    3.  HLD - Taking medications without problems.  Denies muscle aches or RUQ pain.     4.  Obesity - Exercising 4 days/week, but has not lost weight.    5.  Insomnia - Difficulty falling asleep.  + racing thoughts.  Drinks green tea in am, but otherwise denies caffeine.  Watches TV in bed.  Poor sleep hygiene.  Denies symptoms of depression.    Prior Medications Reviewed Using: Patient Recall  Current Allergies (reviewed today): ! ACE INHIBITORS ! * STATINS  Past Medical History:    6/02 suspect rt knee meniscal tear    Carpal Tunnel Syndrome    Gonorrhea  11-99  Past Surgical History:    6/01 hysterectomy for fibroids; still with ovary - paps by Dr. Cherly Hensen (last 1/09)    Depo-Lupron 03-01 - 08/20/1999   Family History:    2 Sisters-Both have DM    Father-CVA    Mom died age 41-Heart Disease  Social History:    Reviewed history from 07/12/2006 and no changes required:       Single,  nonsmoker; son - Warehouse manager; works at [employer];  occasional etoh, no cigs, no drug use   Risk Factors:  Exercise:  yes  Family History Risk Factors:    Family History of MI in females < 35 years old:  yes  PAP Smear History:     Date of Last PAP Smear:  05/16/2007   PAP Smear History:     Date of Last PAP Smear:  05/16/2007    Results:  normal   PAP Smear History:     Date of Last PAP Smear:  05/16/2007   PAP Smear History:     Date of Last PAP Smear:  05/16/2007    Results:  normal   PAP Smear History:     Date of Last PAP Smear:  05/16/2007   PAP Smear History:     Date of Last PAP Smear:  05/16/2007    Results:  normal   PAP Smear History:     Date of Last PAP Smear:  05/16/2007   PAP Smear History:     Date of Last PAP Smear:  05/16/2007    Results:  normal  PAP Smear History:     Date of Last PAP Smear:  05/16/2007   PAP Smear History:     Date of Last PAP Smear:  05/16/2007    Results:  normal    Review of Systems      See HPI   Physical Exam  General:     Overweight-appearing, no acute distress.  Alert and oriented x 3.  Lungs:     Clear to auscultation bilaterally.  Normal work of breathing.  No wheezes, rales, or rhonchi.  Heart:     Regular rate and rhythm.  No murmur, rub, or gallop.  2+ Dorsalis Pedis pulses.  Abdomen:     Normoactive bowel sounds.  Soft, obese, non-tender, non-distended.   Extremities:     No clubbing, cyanosis, or edema.  Psych:     Cognition and judgment appear intact. Alert and cooperative with normal attention span and concentration. 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  A1c essentially unchanged despite cleared infection, increased Metformin.  Hasn't been taking Actos and does not desire to start.  Reports regular exercise, though hasn't lost any weight since 1/09.   Discussed with patient that it is okay to stay off Actos if she is willing to titrate her Lantus - agreeable.  Advised increasing by 2 units each day for fasting am sugar > 120.   The following medications were removed from the medication list:    Actos 30 Mg Tabs (Pioglitazone hcl) .Marland Kitchen... Take 1 tablet by mouth once a day Her updated medication list for this problem includes:    Aspirin Ec 81 Mg Tbec (Aspirin) .Marland Kitchen... Take 1 tablet by mouth once a day    Hyzaar 100-25 Mg Tabs (Losartan potassium-hctz) .Marland Kitchen... Take 1 tablet by mouth once a day    Lantus 100 Unit/ml Soln (Insulin glargine) .Marland KitchenMarland KitchenMarland KitchenMarland Kitchen 10 unit subcutaneously once a day    Metformin Hcl 1000 Mg Tabs (Metformin hcl) .Marland Kitchen... Take 1 tablet by mouth two times a day Orders: A1C-[facility] ([address]) Comp Met-[facility] ([id number]) [facility]- Est  Level 4 (91478) Labs Reviewed: HgBA1c: 9.0 (12/09/2007), 9.1 (05/2007);  Creat: 0.99 (05/03/2007)   Microalbumin: trace (12/28/2006) Last Eye Exam: normal (10/18/2007)   Problem # 2:  HYPERTENSION, BENIGN SYSTEMIC (ICD-401.1) Assessment: Improved Well-controlled on current medication.  Continue. Her updated medication list for this problem includes:    Hyzaar 100-25 Mg Tabs (Losartan potassium-hctz) .Marland Kitchen... Take 1 tablet by mouth once a day Orders: Comp Met-[facility] ([id number]) [facility]- Est  Level 4 (99214) BP today: 117/79 Prior BP: 139/84 (06/05/2007) Labs Reviewed: Creat: 0.99 (05/03/2007) Chol: 181 (12/28/2006)   HDL: 69 (12/28/2006)   LDL: 99 (12/28/2006)   TG: 63 (12/28/2006)   Problem # 3:  HYPERLIPIDEMIA NEC/NOS (ICD-272.4) Last LDL 99 on current medication.  Due for repeat FLP in 8/09.  Not fasting today.  Will obtain at fu visit in 1 month. Her updated medication list for this problem includes:    Lovastatin 40 Mg Tabs (Lovastatin) .Marland Kitchen... 1 tablet at bedtime Orders: Comp Met-[facility] [id number]) [facility]- Est  Level 4 (28413) Labs Reviewed: Chol: 181 (12/28/2006)   HDL: 69 (12/28/2006)   LDL: 99 (12/28/2006)   TG: 63  (12/28/2006) SGOT: 23 (12/28/2006)   SGPT: 18 (12/28/2006)   Problem # 4:  OBESITY, NOS (ICD-278.00) Assessment: Unchanged Discussed importance of diet/exercise in improving DM control.  Pt reports committment to exercising 4 days per  week.  Needs to limit portions.  Consider nutritionist referral if does not improve. Orders: [facility]- Est  Level 4 ([address])   Problem # 5:  INSOMNIA NOS (ICD-780.52) Provided handout on good sleep hygiene habits.  Denies symptoms of depression.  Pt agreeable to holding off on pharmacologic Rx at this time.  Will follow. Orders: TSH-[facility] ([id number])   Complete Medication List: 1)  Aspirin Ec 81 Mg Tbec (Aspirin) .... Take 1 tablet by mouth once a day 2)  Clotrimazole Anti-fungal 1 % Crea (Clotrimazole) .... Apply liberally to affected area twice a day 3)  Hydrocortisone 2.5 % Crea (Hydrocortisone) .... Apply a small amount to affected area twice a day 4)  Hyzaar 100-25 Mg Tabs (Losartan potassium-hctz) .... Take 1 tablet by mouth once a day 5)  Lantus 100 Unit/ml Soln (Insulin glargine) .Marland Kitchen.. 10 unit subcutaneously once a day 6)  Metformin Hcl 1000 Mg Tabs (Metformin hcl) .... Take 1 tablet by mouth two times a day 7)  Lovastatin 40 Mg Tabs (Lovastatin) .Marland Kitchen.. 1 tablet at bedtime   Patient Instructions: 1)  Please schedule a follow-up appointment in 1 month - come fasting to check your cholesterol. 2)  Increase your Lantus by 2 units each morning if you blood sugar is > 120.   3)  Eat a bedtime snack if your blood sugar before bed is < 150. 4)  Check your blood sugars regularly. If your readings are usually above 250 or below 70 you should contact our office. 5)  It is important that your Diabetic A1c level is checked every 3 months. 6)  Check your feet each night for sore areas, calluses or signs of infection.   Prescriptions: LOVASTATIN 40 MG  TABS (LOVASTATIN) 1 tablet at bedtime  #34 x 5   Entered and Authorized by:   [name] MD   Signed by:   [name] MD on 12/09/2007   Method used:   Electronically sent to ...       [facility] [address] [phone]*       [address]       [place], [place]  [address]       Ph: [phone] or [phone]       Fax: [phone]   RxID:   [id number] METFORMIN HCL 1000 MG TABS (METFORMIN HCL) Take 1 tablet by mouth two times a day  #68 x 5   Entered and Authorized by:   [name] MD   Signed by:   [name] MD on 12/09/2007   Method used:   Electronically sent to ...       [facility] [address] [phone]*       [address]       [place], [place]  [address]       Ph: [phone] or [phone]       Fax: [phone]   RxID:   [id number] LANTUS 100 UNIT/ML SOLN (INSULIN GLARGINE) 10 unit subcutaneously once a day  #300 x 12   Entered and Authorized by:   [name] MD   Signed by:   [name] MD on 12/09/2007   Method used:   Electronically sent to ...       [facility] [address] [phone]*       [address]       [place], [place]  [address]       Ph: [phone] or [phone]       Fax: [phone]   RxID:   [id number]  ]  Vital Signs:  Patient Profile:   55 Years Old Female Height:     66 inches Weight:      203.2 pounds Temp:     98.5 degrees F Pulse rate:   90 / minute BP sitting:   117 / 79                Last HDL:  69 (12/28/2006 9:46:00 PM) HDL Next Due:  1 yr Last LDL:  99 (12/28/2006 9:46:00 PM) LDL Next Due:  1 yr Last PAP:  Done. (06/16/1999 12:00:00 AM) PAP Result Date:  05/16/2007 PAP Result:  normal PAP Next Due:  2 yr Diabetic Eye Exam Date:  10/18/2007 Diabetes Eye Exam Result:  normal Diabetes Eye Exam Due:  1 yr Foot Care Education Date:  12/09/2007 Foot Care Education:  completed Foot Care Education Due:  1 yr  Laboratory Results   Blood Tests   Date/Time Received: December 09, 2007 9:07 AM  Date/Time Reported: December 09, 2007 9:17 AM   HGBA1C:  9.0%   (Normal Range: Non-Diabetic - 3-6%   Control Diabetic - 6-8%)  Comments: ...............test performed by......Marland KitchenBonnie A. [place], [place] (ASCP)

## 2010-06-14 NOTE — Assessment & Plan Note (Signed)
Summary: hemoccult positive/neck paintcb   Vital Signs:  Patient profile:   55 year old female Height:      66 inches Weight:      205.3 pounds BMI:     33.26 Temp:     98.4 degrees F oral Pulse rate:   98 / minute BP sitting:   135 / 80  (right arm) Cuff size:   regular  Vitals Entered By: Garen Grams LPN (April 02, 2009 9:12 AM) CC: blood in stool Is Patient Diabetic? Yes Did you bring your meter with you today? No Pain Assessment Patient in pain? no        Primary Care Provider:  Delbert Harness MD  CC:  blood in stool.  History of Present Illness: 55 yo here today for follow-up of hemoccult postive stools and neck pain.  hemoccult positive stools: Work tested [employer] and was asked to see PCP for 4/6 positive hemoccult stools. No change in BM's, no no weight loss.  Does endorse some occaisional nausea after meals- no association with fatty or spicy meals.  No abdominal pains.  Still has gallbladder.  Had normal colonoscopy in 2008.    Neck pain:  has had continued right neck and shoulder pain.  States it doesn't bother during work where she lifts boxes but is very painful at the end of the day with spasms.  no radiation of pain.  hand numbness:  patient has bilateral hand numbness after sleeping previously diagnosed at carpal tunnel syndrome.  Got a lot of benefit from wrist braces several years ago but since then has lost her bracses and would like to get some again.    Allergies: 1)  ! Ace Inhibitors 2)  ! * Statins  Review of Systems      See HPI General:  Denies fever, weakness, and weight loss. CV:  Denies chest pain or discomfort, lightheadness, palpitations, shortness of breath with exertion, and swelling of feet. Resp:  Denies cough and shortness of breath. GI:  Denies abdominal pain, bloody stools, change in bowel habits, constipation, dark tarry stools, diarrhea, gas, hemorrhoids, indigestion, loss of appetite, nausea, and vomiting. Heme:  Denies  abnormal bruising and bleeding.  Physical Exam  General:  Overweight-appearing, no acute distress.  Alert and oriented x 3.  Lungs:  Clear to auscultation bilaterally.  Normal work of breathing.  No wheezes, rales, or rhonchi.  Heart:  Regular rate and rhythm.  No murmur, rub, or gallop.  2+ Dorsalis Pedis pulses.  Abdomen:  soft, non-tender, normal bowel sounds, no distention, no masses, no guarding, no rigidity, no rebound tenderness, no abdominal hernia, no inguinal hernia, no hepatomegaly, and no splenomegaly.  Msk:  full range of motion or neck and shoudler with minimal pain.  tender to palpation along left cervical paraspinal nd trapezius muscles.  bilateral + tinels. Pulses:  2+ radial pulses bilaterally Extremities:  no edema Neurologic:  normal stregth in upper extremities bilaterally.   Impression & Recommendations:  Problem # 1:  HEMOCCULT POSITIVE STOOL (ICD-578.1)  4/6 hemoccult positive found at workplace clinic.  patient with normal colonoscopy in 2008.  No evidence of other symptomatic GI bleed.  patient continues to be anemic despite iron supplementation.  Discussed trial of PPI but patient said she no longer needs it for reflux is asymptomatic.  Will refer to GI for further workup.  Orders: [facility]- Est  Level 4 (57846) Gastroenterology Referral (GI)  Problem # 2:  ANEMIA, IRON DEFICIENCY, UNSPEC. (ICD-280.9) Presumed GI blood loss.  patient s/p hysterectomy/ post-menopausal.  Iron studies in past showed iron def anemia. Asked patient to increase iron supplements to three times a day.  Will check baseline CBC today and will repeat, get GI workup.  Orders: CBC-[facility] (16109) [facility]- Est  Level 4 (60454) Gastroenterology Referral (GI)  Problem # 3:  NECK PAIN, CHRONIC (ICD-723.1) Will get xray to evaluate for disk disease.  Gave patient precautions to avoid upward lifting, sleepign with supportive pillow for neutral spine.  Will refer to physical therapy and refill  flexeril.  The following medications were removed from the medication list:    Diclofenac Sodium 75 Mg Tbec (Diclofenac sodium) .Marland Kitchen... 1 tablet by mouth two times a day with food as needed for shoulder pain Her updated medication list for this problem includes:    Aspirin Ec 81 Mg Tbec (Aspirin) .Marland Kitchen... Take 1 tablet by mouth once a day    Flexeril 10 Mg Tabs (Cyclobenzaprine hcl) .Marland Kitchen... 1/2 to 1 tablet by mouth at bedtime as needed for shoulder pain - do not drive or drink alcohol while taking  Orders: Diagnostic X-Ray/Fluoroscopy (Diagnostic X-Ray/Flu) [facility]- Est  Level 4 (09811) Physical Therapy Referral (PT)  Problem # 4:  CARPAL TUNNEL SYNDROME, BILATERAL (ICD-354.0) given bilateral wrist splints today in office.  Orders: Splint wrist/thumb- [facility] (B1478) [facility]- Est  Level 4 (29562)  Complete Medication List: 1)  Aspirin Ec 81 Mg Tbec (Aspirin) .... Take 1 tablet by mouth once a day 2)  Lantus 100 Unit/ml Soln (Insulin glargine) .... 25 units subcutaneously qam - disp 30 day supply - place script on hold 3)  Metformin Hcl 1000 Mg Tabs (Metformin hcl) .... Take 2 tablet by mouth two times a day with food 4)  Hyzaar 100-25 Mg Tabs (Losartan potassium-hctz) .... Take 1 tablet by mouth once a day -  - place script on hold 5)  Lovastatin 40 Mg Tabs (Lovastatin) .Marland Kitchen.. 1 tablet at bedtime - place script on hold 6)  Flexeril 10 Mg Tabs (Cyclobenzaprine hcl) .... 1/2 to 1 tablet by mouth at bedtime as needed for shoulder pain - do not drive or drink alcohol while taking  Patient Instructions: 1)  Wll set up appt for physical therapy after your xrays come back. 2)  I will cal you to discuss your xrays. 3)  Continue flexeril and tylenol as needed. 4)  Increase iron to 3 tablets a day. 5)  Braces for wrist pain 6)  Make follow-up appt for diabetes. 7)  Will make you follow-up appt for Dr. Loreta Ave to evaluate blood in stools. Prescriptions: FLEXERIL 10 MG TABS (CYCLOBENZAPRINE HCL) 1/2 to 1 tablet by  mouth at bedtime as needed for shoulder pain - do not drive or drink alcohol while taking  #30 x 1   Entered and Authorized by:   Delbert Harness MD   Signed by:   Delbert Harness MD on 04/04/2009   Method used:   Electronically to        [facility] [address] [id number]* (retail)       [address]       [place], [place]  [address]       Ph: [phone] or [phone]       Fax: [phone]   RxID:   [id number]    Prevention & Chronic Care Immunizations   Influenza vaccine: Fluvax Non-MCR  (02/22/2009)   Influenza vaccine due: 03/03/2009    Tetanus booster: 05/15/2001: Done.   Tetanus  booster due: 05/16/2011    Pneumococcal vaccine: Not documented  Colorectal Screening   Hemoccult: Not documented   Hemoccult due: Not Indicated    Colonoscopy: normal  (05/22/2006)   Colonoscopy due: 05/22/2016  Other Screening   Pap smear: normal  (06/15/2008)   Pap smear due: 06/16/2011    Mammogram: ASSESSMENT: Negative - BI-RADS 1^MM DIGITAL SCREENING  (09/21/2008)   Mammogram due: 09/15/2009   Smoking status: never  (02/22/2009)  Diabetes Mellitus   HgbA1C: 8.8  (12/28/2008)   Hemoglobin A1C due: 09/07/2008    Eye exam: normal  (10/18/2007)   Eye exam due: 10/17/2008    Foot exam: yes  (08/05/2008)   High risk foot: Not documented   Foot care education: completed  (12/09/2007)   Foot exam due: 03/03/2009    Urine microalbumin/creatinine ratio: Not documented   Urine microalbumin/cr due: 03/03/2009  Lipids   Total Cholesterol: 223  (11/30/2008)   LDL: 131  (11/30/2008)   LDL Direct: Not documented   HDL: 77  (11/30/2008)   Triglycerides: 53  (05/28/2008)    SGOT (AST): 30  (11/30/2008)   SGPT (ALT): 30  (11/30/2008)   Alkaline phosphatase: 112  (11/30/2008)   Total bilirubin: 0.2  (11/30/2008)  Hypertension   Last Blood Pressure: 135 / 80  (04/02/2009)   Serum creatinine: 0.82  (05/28/2008)   Serum potassium 4.6   (11/30/2008)  Self-Management Support :    Diabetes self-management support: Copy of home glucose meter record  (02/22/2009)    Hypertension self-management support: Not documented    Lipid self-management support: Not documented

## 2010-06-14 NOTE — Miscellaneous (Signed)
Summary: Outside Labs - CBC, CMP, FLP  Clinical Lists Changes  Observations: Added new observation of TRIGLYC TOT: 53 mg/dL (04/54/0981 19:14) Added new observation of HDL: 71 mg/dL (78/29/5621 30:86) Added new observation of LDL: 91 mg/dL (57/84/6962 95:28) Added new observation of CHOLESTEROL: 173 mg/dL (41/32/4401 02:72) Added new observation of ALBUMIN: 4.5 g/dL (53/66/4403 47:42) Added new observation of BILI TOTAL: 0.3 mg/dL (59/56/3875 64:33) Added new observation of SGPT (ALT): 16 units/L (05/28/2008 21:25) Added new observation of SGOT (AST): 18 units/L (05/28/2008 21:25) Added new observation of CALCIUM: 9.5 mg/dL (29/51/8841 66:06) Added new observation of BG FASTING: 232 mg/dL (30/16/0109 32:35) Added new observation of CREATININE: 0.82 mg/dL (57/32/2025 42:70) Added new observation of BUN: 15 mg/dL (62/37/6283 15:17) Added new observation of CO2 TOTAL: 27 mmol/L (05/28/2008 21:25) Added new observation of POTASSIUM: 4.6 mmol/L (05/28/2008 21:25) Added new observation of SODIUM: 139 mmol/L (05/28/2008 21:25) Added new observation of PLATELET CNT: 221 10*3/microliter (05/28/2008 21:24) Added new observation of MCV: 78 fL (05/28/2008 21:24) Added new observation of HCT: 38.2 % (05/28/2008 21:24) Added new observation of HGB: 12.3 g/dL (61/60/7371 06:26) Added new observation of WBC: 3.8 10*3/mm3 (05/28/2008 21:24)      Complete Blood Count Test Date: 05/28/2008             Value   Units      H/L    Reference  WBC:       3.8   X 10^3/uL          (3.5-10.0) Hgb:       12.3  g/dl               (94.8-54.6) Hct:       38.2  %                  (35.0-46.0) MCV:       78    fL            L    (80.0-100.0) Platelets: 221   X 10^3/uL          (150-450)  Test performed by:  [facility]    Chemistry Labs Test Date: 05/28/2008                      Value Units        H/L   Reference  Sodium:             139   mmol/L             (137-145) Potassium:          4.6   mmol/L              (3.6-5.0) CO2:                27    mmol/L             (22-31) BUN:                15    mg/dL              (2-70) Creatinine:         0.82  mg/dL              (3.5-0.0) Glucose-fasting:    232   mg/dL         H    (93-818) Calcium (total):    9.5   mg/dL              (  9-10.5) SGOT:               18    U/L                (10-40) SGPT:               16    U/L                (10-40) T. Bili:            0.3   mg/dL              (4.5-4.0) Albumin:            4.5   g/dL               (3-5)  Lab name:           [facility]    Lipid Panel Test Date: 05/28/2008                        Value        Units        H/L   Reference  Cholesterol:          173          mg/dL              (981-191) LDL Cholesterol:      91           mg/dL              (47-829) HDL Cholesterol:      71           mg/dL              (56-21) Triglyceride:         53           mg/dL              (30-865)  Lab name:             [facility]  Appended Document: Outside Labs - CBC, CMP, FLP   Lab Entry Test Date: 05/28/2008                        Value        Units        H/L   Reference  TSH:                  1.680        mIU/ml             (0.5-5.0)  Lab name:             [facility]

## 2010-06-14 NOTE — Miscellaneous (Signed)
Summary: Colonoscopy Report  Patient reports having Colonoscopy in 2007.  See in paper chart that patient was referred to [facility] in 10/07; however, we never received the report.  Please request a copy.  Thanks..............................Marland KitchenDrue Dun MD December 19, 2007 16:32 PM. CALLED PT. REQUEST FOR PT TO HAVE REPORTS FAXED TO [place]. FAX NUMBER GIVEN. PT WILL CALL [facility].Marland KitchenTHEKLA SLADE CMA,  December 19, 2007 4:38 PM   Appended Document: Colonoscopy Report Never received report - can we request this directly from [facility] please?  Since we made the referral, they should be able to send it to [place] without an ROI.  Thanks, EE  Appended Document: Colonoscopy Report called [facility] and they have no record that patient had ever been there. call to patient and message left to have her call RN back to find out if she ever had a colonscopy. paper chart statement is inconclusive as to whether she ever had this.   Appended Document: Colonoscopy Report Pt is wanting to discuss the above, can be reached at [phone].  Appended Document: Colonoscopy Report tried several times to call patient back and messages left . attempted again today and message left with person answering phone to have patient call back.

## 2010-06-14 NOTE — Assessment & Plan Note (Signed)
Summary: finger and bloodwork wp  Medications Added ACTOS 30 MG TABS (PIOGLITAZONE HCL) Take 1 tablet by mouth once a day ASPIRIN EC 81 MG TBEC (ASPIRIN) Take 1 tablet by mouth once a day CLOTRIMAZOLE ANTI-FUNGAL 1 % CREA (CLOTRIMAZOLE) Apply liberally to affected area twice a day HYDROCORTISONE 2.5 % CREA (HYDROCORTISONE) Apply a small amount to affected area twice a day HYZAAR 100-25 MG TABS (LOSARTAN POTASSIUM-HCTZ) Take 1 tablet by mouth once a day LANTUS 100 UNIT/ML SOLN (INSULIN GLARGINE) 10 unit subcutaneously once a day METFORMIN HCL 500 MG TABS (METFORMIN HCL) Take 1 tablet by mouth twice a day LOVASTATIN 20 MG TABS (LOVASTATIN) 1 tablet at bedtime      Allergies Added: ! ACE INHIBITORS ! * STATINS  Vital Signs:  Patient Profile:   55 Years Old Female Height:     66 inches Weight:      190.8 pounds BMI:     30.91 Temp:     98.2 degrees F Pulse rate:   72 / minute BP sitting:   126 / 81  (right arm)  Pt. in pain?   yes    Location:   rt thumb    Intensity:   8    Type:       sore  Vitals Entered By: Theresia Lo RN (December 28, 2006 8:56 AM)              Is Patient Diabetic? Yes    Chief Complaint:  diabetes f/u and rt thumb pain.  History of Present Illness: dm- last Hba1c 8.1, still having lows right after working out, feels jittery.  (65-80) before exercise 105.  using protein shake right before exercise (30 to 40 minutes).  sensation in feet good.  still losing weight.  on metformin and actos.  lantus 10 units in AM.  checking sugars in AM and before exercise class and after exercise class.    htn at goal.  no ha, cp, or cob.  heart rate gets up with exercise.  no changes   R thumb- no pain, happened about a month ago, thumb would not bend properly. noted to have bone spur on xray films, but doing better.    cholesterol- fasting today.  on lovastatin.  no muscle aches or cramps.  pt is continuing to losing weight.      hand numbness- wake up in middle  of night numb.  picks up boxes all day.  hx  of carpal tunnel.    Current Allergies: ! ACE INHIBITORS ! * STATINS  Past Medical History:    Reviewed history from 07/12/2006 and no changes required:       6/02 suspect rt knee meniscal tear, 10/2000 - 1+ microalbumin, Carpal Tunnel Syndrome, Gonorrhea  11-99  Past Surgical History:    Reviewed history from 07/12/2006 and no changes required:       6/01 hysterectomy for fibroids; still with ovary - 12/30/1999, Depo-Lupron 03-01 - 08/20/1999   Family History:    Reviewed history from 07/12/2006 and no changes required:       2 Sisters-Both have DM, Father-CVA, Mom died age 44-Heart Disease  Social History:    Reviewed history from 07/12/2006 and no changes required:       Single, nonsmoker; son - Remi Deter; works at [employer];  occasional etoh, no cigs, no drug use;   Risk Factors:  Tobacco use:  never    Physical Exam  General:     Well-developed,well-nourished,in no acute distress; alert,appropriate  and cooperative throughout examination Mouth:     Oral mucosa and oropharynx without lesions or exudates.  Teeth in good repair. Neck:     No deformities, masses, or tenderness noted. Lungs:     Normal respiratory effort, chest expands symmetrically. Lungs are clear to auscultation, no crackles or wheezes. Heart:     Normal rate and regular rhythm. S1 and S2 normal without gallop, murmur, click, rub or other extra sounds. Extremities:     No clubbing, cyanosis, edema, or deformity noted with normal full range of motion of all joints.    Diabetes Management Exam:    Foot Exam (with socks and/or shoes not present):       Sensory-Pinprick/Light touch:          Left medial foot (L-4): normal          Left dorsal foot (L-5): normal          Left lateral foot (S-1): normal          Right medial foot (L-4): normal          Right dorsal foot (L-5): normal          Right lateral foot (S-1): normal       Sensory-Monofilament:           Left foot: normal          Right foot: normal       Inspection:          Left foot: normal          Right foot: normal       Nails:          Left foot: normal          Right foot: normal    Eye Exam:       Eye Exam done elsewhere          Date: 08/14/2006          Results: normal    Impression & Recommendations:  Problem # 1:  HYPERTENSION, BENIGN SYSTEMIC (ICD-401.1) doing well.  continue current meds.  at goal.  check lab work.   Her updated medication list for this problem includes:    Hyzaar 100-25 Mg Tabs (Losartan potassium-hctz) .Marland Kitchen... 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 congrats to pt given.  will decrease lantus from 10 to 6-7 in am to help with low sugars prior and after exercise class.  may need to decrease basal amts due to weight loss.   Her updated medication list for this problem includes:    Actos 30 Mg Tabs (Pioglitazone hcl) .Marland Kitchen... Take 1 tablet by mouth once a day    Aspirin Ec 81 Mg Tbec (Aspirin) .Marland Kitchen... Take 1 tablet by mouth once a day    Hyzaar 100-25 Mg Tabs (Losartan potassium-hctz) .Marland Kitchen... Take 1 tablet by mouth once a day    Lantus 100 Unit/ml Soln (Insulin glargine) .Marland KitchenMarland KitchenMarland KitchenMarland Kitchen 10 unit subcutaneously once a day    Metformin Hcl 500 Mg Tabs (Metformin hcl) .Marland Kitchen... Take 1 tablet by mouth twice a day  Orders: A1C-[facility] (91478) UA Microalbumin-[facility] (82044) [facility]- Est  Level 4 (99214)   Problem # 3:  HYPERLIPIDEMIA NEC/NOS (ICD-272.4) continue lovastatin.  will check fasting labs today.   Orders: Comp Met-[facility] [phone]) Lipid-[facility] [id number]) [facility]- Est  Level 4 (  16109)  Her updated medication list for this problem includes:    Lovastatin 20 Mg Tabs (Lovastatin) .Marland Kitchen... 1 tablet at bedtime   Problem # 4:  CARPAL TUNNEL SYNDROME, BILATERAL (ICD-354.0) Assessment: Comment  Only hx of.  not currently a problem for patient.  will follow.    Problem # 5:  THUMB PAIN, RIGHT (ICD-729.5) review old echart  and films and exam is normal.  no further treatment for now.   Orders: [facility]- Est  Level 4 (99214)   Complete Medication List: 1)  Actos 30 Mg Tabs (Pioglitazone hcl) .... Take 1 tablet by mouth once a day 2)  Aspirin Ec 81 Mg Tbec (Aspirin) .... Take 1 tablet by mouth once a day 3)  Clotrimazole Anti-fungal 1 % Crea (Clotrimazole) .... Apply liberally to affected area twice a day 4)  Hydrocortisone 2.5 % Crea (Hydrocortisone) .... Apply a small amount to affected area twice a day 5)  Hyzaar 100-25 Mg Tabs (Losartan potassium-hctz) .... Take 1 tablet by mouth once a day 6)  Lantus 100 Unit/ml Soln (Insulin glargine) .Marland Kitchen.. 10 unit subcutaneously once a day 7)  Metformin Hcl 500 Mg Tabs (Metformin hcl) .... Take 1 tablet by mouth twice a day 8)  Lovastatin 20 Mg Tabs (Lovastatin) .Marland Kitchen.. 1 tablet at bedtime   Patient Instructions: 1)  Please schedule a follow-up appointment in 3 months. 2)  decrease lantus to 6-7 units in AM.  3)  Eat your protein bar 1 hr to 1.5 hr before exercise class.   4)  bring medication list with next visit.  5)  pap with gynecologist in november.          Vital Signs:  Patient Profile:   55 Years Old Female Height:     66 inches Weight:      190.8 pounds BMI:     30.91 Temp:     98.2 degrees F Pulse rate:   72 / minute BP sitting:   126 / 81  (right arm)  Pt. in pain?   yes    Location:   rt thumb    Intensity:   8    Type:       sore  Vitals Entered By: Theresia Lo RN (December 28, 2006 8:56 AM)              Is Patient Diabetic? Yes    Laboratory Results   Urine Tests  Date/Time Recieved: December 28, 2006 9:26 AM  Date/Time Reported: December 28, 2006 9:30 AM  Microalbumin (urine): trace mg/L    Comments: ...................................................................[facility]  December 28, 2006 9:30  AM   Blood Tests   Date/Time Recieved: December 28, 2006 9:26 AM  Date/Time Reported: December 28, 2006 9:39 AM   HGBA1C: 7.2%   (Normal Range: Non-Diabetic - 3-6%   Control Diabetic - 6-8%)  Comments: ...............test performed by......Marland KitchenBonnie A. [place], [place] (ASCP)

## 2010-06-14 NOTE — Consult Note (Signed)
Summary: [facility]: GI Consult  [facility]   Imported By: Clydell Hakim 06/29/2009 15:44:45  _____________________________________________________________________  External Attachment:    Type:   Image     Comment:   External Document

## 2010-06-14 NOTE — Assessment & Plan Note (Signed)
Summary: FU AND LAB/KH   Vital Signs:  Patient Profile:   55 Years Old Female Height:     66 inches Weight:      204.8 pounds Pulse rate:   80 / minute BP sitting:   138 / 89  (right arm)  Pt. in pain?   yes    Location:   left shoulder    Intensity:   8  Vitals Entered By: Renato Battles slade,cma              Is Patient Diabetic? Yes    Last Flu Vaccine:  Fluvax 3+ (03/20/2007 8:34:22 AM) Flu Vaccine Result Date:  03/03/2008 Flu Vaccine Result:  given Flu Vaccine Next Due:  1 yr Last HDL:  69 (12/28/2006 9:46:00 PM) HDL Next Due:  1 yr Last LDL:  99 (12/28/2006 9:46:00 PM) LDL Next Due:  1 yr Flex Sig Next Due:  Not Indicated Hemoccult Next Due:  Not Indicated   PCP:  [name] MD  Chief Complaint:  f/u DM, shoulder pain, and rash under breasts.  History of Present Illness: 55 yo F here for f/u of DM and c/o Shoulder Pain and Rash under breasts.  1.  DM - Taking medications without problems.  Checking home blood sugars - reports conintue to be elevated in 200's.  Denies dizziness, shakiness, confusion, polyuria, polydipsia, or nocturia.  Denies recent infection or complications.  Continues Exercising 4 days per week - did Crop walk this past weekend.    2.  Left Shoulder Pain -  Location:  Left Shoulder (neck, radiating into upper arm, left ear) Onset:   ~ 2 months ago Course:  Persistant Warmth, Swelling, or Redness:  No Fever or Chills:  No Weakness or Numbness:  ? minimal Hx of injury:  No, but does lots of heavy lifting of boxes at work Aggravating factors:  Heavy lifting Alleviating factors:  Heat Treatments Tried:  Heat, topicals Prior Imaging:  None Prior Surgery:  None  3.  Chest Pain - Reports single episode of sharp left-sided pain lasting only minutes last week - no radiation or associated symptoms of nausea, SOB.  No other problems with CP.  Exercising frequently without SOB or CP.    4.  Rash under breasts - recurrent.  Has had for  ~ 1 month.   Clotrimazole cream prescribed in past and helped.    Updated Prior Medication List: ASPIRIN EC 81 MG TBEC (ASPIRIN) Take 1 tablet by mouth once a day HYZAAR 100-25 MG TABS (LOSARTAN POTASSIUM-HCTZ) Take 1 tablet by mouth once a day LANTUS 100 UNIT/ML SOLN (INSULIN GLARGINE) 15 units subcutaneously once a day METFORMIN HCL 1000 MG TABS (METFORMIN HCL) Take 1 tablet by mouth two times a day LOVASTATIN 40 MG  TABS (LOVASTATIN) 1 tablet at bedtime  Current Allergies (reviewed today): ! ACE INHIBITORS ! 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 (ICD-280.9)    INSOMNIA NOS (ICD-780.52)    CARPAL TUNNEL SYNDROME, BILATERAL (ICD-354.0)    RHINITIS, ALLERGIC (ICD-477.9)    ECZEMA, ATOPIC DERMATITIS (ICD-691.8)    Hx of KNEE MENISCUS INJURY, UNSPECIFIED  - Rt knee 6/02 (ICD-836.2)    Hx of GONNORHEA 1999       Past  Surgical History:    Reviewed history from 12/09/2007 and no changes required:       6/01 hysterectomy for fibroids; still with ovary - paps by Dr. 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  Exercises 4 times per week - in programs.      Review of Systems      See HPI   Physical Exam  General:     Overweight-appearing, no acute distress.  Alert and oriented x 3.  Ears:     No external deformities.  B TM pearly gray with cone.    Lungs:     Clear to auscultation  bilaterally.  Normal work of breathing.  No wheezes, rales, or rhonchi.  Heart:     Regular rate and rhythm.  No murmur, rub, or gallop.  2+ Dorsalis Pedis pulses.  Msk:     Extremely tight shoulder muscles particularly on left. Left shoulder normal on inspection.  No erythema, warmth, or deformity.  Normal ROM and strength and sensation.  No weakness or numbness in shoulder, elbow, wrist, or fingers.  Negative cross-over test.  Mild pain with Neer's, Empty Can, Hawkin's. Extremities:     No clubbing, cyanosis, or edema.  Neurologic:     alert & oriented X3, sensation intact to light touch, and gait normal.    Diabetes Management Exam:    Foot Exam (with socks and/or shoes not present):       Sensory-Pinprick/Light touch:          Left medial foot (L-4): normal          Left dorsal foot (L-5): normal          Left lateral foot (S-1): normal          Right medial foot (L-4): normal          Right dorsal foot (L-5): normal          Right lateral foot (S-1): normal       Sensory-Monofilament:          Left foot: normal          Right foot: normal       Inspection:          Left foot: normal          Right foot: normal       Nails:          Left foot: normal          Right foot: normal    Impression & Recommendations:  Problem # 1:  DIABETES MELLITUS, II, 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  Patient aware of goal of < 7.  Has titrated Lantus up from 10 to 15 units as of today, but no further.  Advised to titrate by 2 units every morning for fasting blood sugar > 120.  Continue other medications.   Her updated medication list for this problem includes:    Aspirin Ec 81 Mg Tbec (Aspirin) .Marland Kitchen... Take 1 tablet by mouth once a day  Hyzaar 100-25 Mg Tabs (Losartan potassium-hctz) .Marland Kitchen... Take 1 tablet by mouth once a day    Lantus 100 Unit/ml Soln (Insulin glargine) .Marland KitchenMarland KitchenMarland KitchenMarland Kitchen 15 units subcutaneously once a day    Metformin Hcl 1000 Mg Tabs  (Metformin hcl) .Marland Kitchen... Take 1 tablet by mouth two times a day Orders: A1C-[facility] (14782) UA Microalbumin-[facility] (95621) [facility]- Est  Level 4 (30865) Labs Reviewed: HgBA1c: 10.3 (03/03/2008)   Creat: 0.80 (12/09/2007)   Microalbumin: negative (03/03/2008) Last Eye Exam: normal (10/18/2007)   Problem # 2:  SHOULDER PAIN, LEFT (ICD-719.41) Assessment: New Muscle spasm ? from rotator cuff syndrome.  Provided neck and shoulder exercises and stressed importance of scapular stabilization.  Continue heat as needed for comfort.  Will prescribe Flexeril to use at bedtime.  F/u in 1 month. Her updated medication list for this problem includes:    Aspirin Ec 81 Mg Tbec (Aspirin) .Marland Kitchen... Take 1 tablet by mouth once a day    Flexeril 10 Mg Tabs (Cyclobenzaprine hcl) .Marland Kitchen... 1/2 to 1 tablet by mouth at bedtime as needed for shoulder pain - do not drive or drink alcohol while taking  Orders: [facility]- Est  Level 4 (78469)   Problem # 3:  CHEST PAIN, ATYPICAL (ICD-786.59) Assessment: New Single episode, atypical, likely MSK or GERD.  Obtained EKG in office which is normal.  Good Exercise tolerance without CP or SOB, thus think angina is unlikely.  Advised patient to call if occurs again.  Given female and diabetic, would then probably need cardiology referral. Orders: 12 Lead EKG (12 Lead EKG) [facility]- Est  Level 4 (62952)   Problem # 4:  INTERTRIGO (WUX-324.40) Assessment: New Will give prescription for Nystatin Powder.  Advised use of Cornstarch or Gold Bond powder to prevent in future after resolution. Orders: [facility]- Est  Level 4 (99214)   Complete Medication List: 1)  Aspirin Ec 81 Mg Tbec (Aspirin) .... Take 1 tablet by mouth once a day 2)  Hyzaar 100-25 Mg Tabs (Losartan potassium-hctz) .... Take 1 tablet by mouth once a day 3)  Lantus 100 Unit/ml Soln (Insulin glargine) .Marland Kitchen.. 15 units subcutaneously once a day 4)  Metformin Hcl 1000 Mg Tabs (Metformin hcl) .... Take 1 tablet by mouth two times a day 5)   Lovastatin 40 Mg Tabs (Lovastatin) .Marland Kitchen.. 1 tablet at bedtime 6)  Flexeril 10 Mg Tabs (Cyclobenzaprine hcl) .... 1/2 to 1 tablet by mouth at bedtime as needed for shoulder pain - do not drive or drink alcohol while taking 7)  Nystatin 100000 Unit/gm Powd (Nystatin) .... Apply under breasts two times a day until rash resolved - disp 1 container  Other Orders: Comp Met-[facility] (10272-53664) Lipid-[facility] (40347-42595) Influenza Vaccine NON MCR (63875)   Patient Instructions: 1)  Please schedule a follow-up appointment in 1 month for your Diabetes and Shoulder Pain. 2)  Please schedule a follow-up appointment in January for a full physical and pap smear. 3)  Increase your Lantus by 2 units every morning if your fasting sugar is > 120.   4)  Do the 4 neck exercises demonstrated in clinic 5)  Head turn (3 x 30 sec on each side) 6)  Head tilt/Chin turn (3 x 30 sec on each side) 7)  Chin Tucks (30) 8)  Head back, Chin turn (30) 9)  You may use the muscle relaxer prescribed at bedtime as needed for your shoulder pain. 10)  Call the office if you have further chest pain. 11)      Prescriptions: NYSTATIN 100000  UNIT/GM POWD (NYSTATIN) Apply under breasts two times a day until rash resolved - disp 1 container  #1 x 0   Entered and Authorized by:   [name] MD   Signed by:   [name] MD on 03/03/2008   Method used:   [facility]        [facility] [address] [phone]* (retail)       [address]       [place], [place]  [address]       Ph: [phone] or [phone]       Fax: [phone]   RxID:   [id number] FLEXERIL 10 MG TABS (CYCLOBENZAPRINE HCL) 1/2 to 1 tablet by mouth at bedtime as needed for shoulder pain - do not drive or drink alcohol while taking  #30 x 0   Entered and Authorized by:   [name] MD   Signed by:   [name] MD on 03/03/2008   Method used:   [facility] to        [facility] [address] [phone]* (retail)       [address]       [place], [place]  [address]       Ph: [phone] or [phone]       Fax: [phone]   RxID:   [id number]  ]  Influenza Vaccine    Vaccine Type: Fluvax Non-MCR    Site: right deltoid    Mfr: GlaxoSmithKline    Dose: 0.5 ml    Route: IM    Given by: Arlyss Repress CMA,    Exp. Date: 11/11/2008    Lot #: [id number]    VIS given: 12/06/06 version given March 03, 2008.  Flu Vaccine Consent Questions    Do you have a history of severe allergic reactions to this vaccine? no    Any prior history of allergic reactions to egg and/or gelatin? no    Do you have a sensitivity to the preservative Thimersol? no    Do you have a past history of Guillan-Barre Syndrome? no    Do you currently have an acute febrile illness? no    Have you ever had a severe reaction to latex? no    Vaccine information given and explained to patient? yes    Are you currently pregnant? no  Vaccine Consent Questions for Influenza    Do you have a history of severe allergic reactions to this vaccine? no    Any prior history of allergic reactions to egg and/or gelatin? no    Do you currently have an acute febrile illness? no    Have you ever had a severe reaction to latex? no    Patient is moderately or severely ill? no    Vaccine information given and explained to patient? yes    Are you currently pregnant? no  Laboratory Results   Urine Tests  Date/Time Received: March 03, 2008 8:55 AM  Date/Time Reported: March 03, 2008 9:11 AM   Microalbumin (urine): negative mg/L   Comments: ...........test performed by...........Marland KitchenTerese Door, CMA   Blood Tests   Date/Time Received: March 03, 2008 8:55 AM  Date/Time Reported: March 03, 2008 9:10 AM   HGBA1C: 10.3%   (Normal Range: Non-Diabetic - 3-6%   Control Diabetic - 6-8%)  Comments: ...........test performed by...........Marland KitchenTerese Door, CMA

## 2010-06-14 NOTE — Miscellaneous (Signed)
Summary: Outpt Physical therapy Eval  [place] Evaluated at [facility] for neck and shoulder pain on Jun 14, 2009  Impressions:  Left neck pain 7/10 in left shoulder and trapezius, reduced during session with light stretching and manual work to cervicals.  Cervical flexion limited by 25%.  Stregth decreased in left anterior deltoid (3+/5)  and left biceps (4/5).  All other  WNL.  Rehab Potential: excellent  Plan:  2 times per week for 4 weeks modalities of ice , heat, ultrasound, mechanical traction as needed.  Therapeutic exercise to improve flexibility and stregth, manual therapy, hom exercise program, proper posture.    Plan approved and faxed back.  Delbert Harness MD  June 15, 2009 11:49 AM

## 2010-06-14 NOTE — Miscellaneous (Signed)
Summary: Labs Drawn at work  Clinical Lists Changes  Observations: Added new observation of VLDL: 15 mg/dL (60/45/4098 11:91) Added new observation of LDL: 93 mg/dL (47/82/9562 13:08) Added new observation of HDL: 69 mg/dL (65/78/4696 29:52) Added new observation of TRIGLYC TOT: 73 mg/dL (84/13/2440 10:27) Added new observation of CHOLESTEROL: 177 mg/dL (25/36/6440 34:74) Added new observation of TSH: 1.3 microintl units/mL (11/11/2009 15:58) Added new observation of PLATELETK/UL: 313 K/uL (11/11/2009 15:58) Added new observation of RDW: 15.6 % (11/11/2009 15:58) Added new observation of MCV: 78 fL (11/11/2009 15:58) Added new observation of HCT: 35.0 % (11/11/2009 15:58) Added new observation of HGB: 11.2 g/dL (25/95/6387 56:43) Added new observation of WBC COUNT: 4.3 10*3/microliter (11/11/2009 15:58) Added new observation of CALCIUM: 9.6 mg/dL (32/95/1884 16:60) Added new observation of ALBUMIN: 4.3 g/dL (63/05/6008 93:23) Added new observation of PROTEIN, TOT: 7.6 g/dL (55/73/2202 54:27) Added new observation of SGPT (ALT): 15 units/L (11/11/2009 15:58) Added new observation of SGOT (AST): 17 units/L (11/11/2009 15:58) Added new observation of ALK PHOS: 100 units/L (11/11/2009 15:58) Added new observation of BILI TOTAL: 0.3 mg/dL (11/05/7626 31:51) Added new observation of GFRAA: 113 mL/min (11/11/2009 15:58) Added new observation of CREATININE: .84 mg/dL (76/16/0737 10:62) Added new observation of BUN: 23 mg/dL (69/48/5462 70:35) Added new observation of ANION GAP: 9.6  (11/11/2009 15:58) Added new observation of CO2 PLSM/SER: 27 meq/L (11/11/2009 15:58) Added new observation of CL SERUM: 100 meq/L (11/11/2009 15:58) Added new observation of K SERUM: 5 meq/L (11/11/2009 15:58) Added new observation of NA: 140 meq/L (11/11/2009 15:58)

## 2010-06-14 NOTE — Consult Note (Signed)
Summary: [facility] Summary  [facility]   Imported By: Clydell Hakim 07/12/2009 16:35:51  _____________________________________________________________________  External Attachment:    Type:   Image     Comment:   External Document

## 2010-06-14 NOTE — Progress Notes (Signed)
Summary: FYI-phn msg  Phone Note Call from Patient Call back at [phone]   Caller: Patient Summary of Call: pt called EMS last night b/c bs went down to 44- and they gave her soemthing and made her eat something and it went to 105 and BP 170/80 Initial call taken by: De Nurse,  Sep 23, 2008 10:39 AM  Follow-up for Phone Call        Amy, will you please call this patient and find out more details about why she might have gone low?  Last A1c > 10%  Was she symptomatic? Did she skip a meal or fail to eat enough? How high has she titrated her Lantus? What have her fastings been running? Has she had any other lows (< 70)?  Thanks, Arsenio Schnorr Follow-up by: [name] MD,  Sep 23, 2008 10:59 AM  Additional Follow-up for Phone Call Additional follow up Details #1::        Pt states she started feeling shaky, dizzy, confused, palpatations and rapid pulse - checked blood sugar 3 times and it was in the 40s each time.  EMS came and checked BS it was 54.  Pt states this occurred around 9:30pm.  She had eaten an apple on her way home from work at 4:30pm and a hot dog for dinner.  States she took 25 units of lantus before the low blood sugar occurred.  Her fasting blood sugars have been less than 100 since last appt with Dr. Deirdre Peer, this morning was 148.  The lowest her BS has gotten since last appt was 77, and she did not have the symptoms she had last evening.  Pt wants to know if she should adjust her lantus this evening? Additional Follow-up by: Jacki Cones RN,  Sep 23, 2008 3:31 PM    Additional Follow-up for Phone Call Additional follow up Details #2::    Excellent history Amy!  Patient should continue her current dose of Lantus for now as her fasting sugars are good and this only occured one time.  Lantus is not rapid acting, thus it was not likely the dose she gave herself just prior to the low blood sugar that caused the low, but rather from the previous day.  If blood sugar is < 150 at  bedtime, should eat a small snack (15 grams of carbs + protein) - i.e. 4 crackers with peanut butter OR a string cheese OR a low fat yogurt OR a glass of low fat milk.  Protein helps maintain blood sugars to prevent themt from crashing after a carbohydrate / sugar load.  An apple has sugar in it, but no protein, so can lead to your sugar going up high, then low when eaten alone.  If continues to have low blood sugars < 70, should call back. Follow-up by: [name] MD,  Sep 23, 2008 3:37 PM  Additional Follow-up for Phone Call Additional follow up Details #3:: Details for Additional Follow-up Action Taken: Left voicemail at number listed above for pt to return my call.   Additional Follow-up by: Jacki Cones RN,  Sep 23, 2008 3:58 PM  Spoke with pt this morning.  Advised her of the above message from Dr. Deirdre Peer.  Pt agreeable to continue taking 25 units of Lantus, and eat a protein + carb snack before bed if BS is less than 150. Jacki Cones RN  Sep 24, 2008 11:48 AM

## 2010-06-14 NOTE — Assessment & Plan Note (Signed)
Summary: F/U VISIT/[facility]   Vital Signs:  Patient profile:   55 year old female Height:      66 inches Weight:      206.9 pounds BMI:     33.52 Temp:     97.0 degrees F oral Pulse rate:   82 / minute BP sitting:   129 / 81  (left arm) Cuff size:   regular  Vitals Entered By: Dedra Skeens CMA, (September 09, 2008 4:06 PM) CC: f/u dm Is Patient Diabetic? Yes  Pain Assessment Patient in pain? no        History of Present Illness: 55 yo [name] who presents for routine f/u of DM, Obesity, HTN, HLD.  Also continues to c/o Left Shoulder Pain.  1.  Diabetes - Taking medications without problems - Taking 25 units Lantus qam (increased from 15 units previously).  Checking home blood sugars - tend to be high in the morning 190 - 230, but are good in the evening before dinner (90's).  Not checking before bed.  Denies dizziness, shakiness, confusion, polyuria, polydipsia, or nocturia.  Denies recent infection or complications.   2.  Obesity - Has lost almost 2 lbs since last OV.  Getting regular exercise (goes to classes at [employer]) 3x / week x 1 hour, no other exercise.  24 hour recall: Awoke 4:50 am - sugar 196 6 am:  V-8 (12 oz), Banana 12 pm:  Can of sugar free peaches (4 oz), 4 oz unsweetened applesauce, water (16 oz) 3 pm:  apple 5:30 pm - sugar 96 6 pm:  1 slice wheat bread, 1 tsp mayonaise, philly steak (sauteed) 1/2 cup, 2 Tbs shredded cheddar cheese, crystal light - ate at the table 8-9 pm:  Applesauce (4 oz) - sitting in chair Bed 11 pm  3.  Left Shoulder Pain - Much improved.  Habits & Providers     Tobacco Status: never  Allergies: 1)  ! Ace Inhibitors 2)  ! 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  Advised to titrate Lantus per patien instructions. Her updated medication list for this problem includes:    Aspirin Ec 81 Mg Tbec (Aspirin) .Marland Kitchen... Take 1  tablet by mouth once a day    Lantus 100 Unit/ml Soln (Insulin glargine) .Marland Kitchen... 25 units subcutaneously qam - disp 30 day supply - place script on hold    Metformin Hcl 1000 Mg Tabs (Metformin hcl) .Marland Kitchen... Take 1 tablet by mouth two times a day with food - place script on hold    Hyzaar 100-25 Mg Tabs (Losartan potassium-hctz) .Marland Kitchen... Take 1 tablet by mouth once a day -  - place script on hold Orders: A1C-[facility] (52841) [facility]- Est  Level 4 (32440) Labs Reviewed: Creat: 0.82 (05/28/2008)   Microalbumin: negative (03/03/2008) Last Eye Exam: normal (10/18/2007) Reviewed HgBA1c results: 10.5 (09/09/2008)  13.1 (06/09/2008)  Problem # 2:  OBESITY, NOS (ICD-278.00) Assessment: Unchanged Has lost 2 lbs. since last OV.  Has made some important lifestyle changes.  Goals:  Increase veggie intake, Protein at lunch, Continue regular exercise (see pt instructions). Orders: [facility]- Est  Level 4 (99214) Ht: 66 (09/09/2008)   Wt: 206.9 (09/09/2008)   BMI: 33.52 (09/09/2008)  Problem # 3:  SHOULDER PAIN, LEFT (ICD-719.41) Assessment: Improved Improved on current medications.  Continue. Her updated medication list for this problem includes:    Aspirin Ec 81 Mg Tbec (Aspirin) .Marland Kitchen... Take  1 tablet by mouth once a day    Flexeril 10 Mg Tabs (Cyclobenzaprine hcl) .Marland Kitchen... 1/2 to 1 tablet by mouth at bedtime as needed for shoulder pain - do not drive or drink alcohol while taking    Diclofenac Sodium 75 Mg Tbec (Diclofenac sodium) .Marland Kitchen... 1 tablet by mouth two times a day with food as needed for shoulder pain Orders: [facility]- Est  Level 4 (99214)  Complete Medication List: 1)  Aspirin Ec 81 Mg Tbec (Aspirin) .... Take 1 tablet by mouth once a day 2)  Lantus 100 Unit/ml Soln (Insulin glargine) .... 25 units subcutaneously qam - disp 30 day supply - place script on hold 3)  Metformin Hcl 1000 Mg Tabs (Metformin hcl) .... Take 1 tablet by mouth two times a day with food - place script on hold 4)  Hyzaar 100-25 Mg Tabs (Losartan  potassium-hctz) .... Take 1 tablet by mouth once a day -  - place script on hold 5)  Lovastatin 40 Mg Tabs (Lovastatin) .Marland Kitchen.. 1 tablet at bedtime - place script on hold 6)  Flexeril 10 Mg Tabs (Cyclobenzaprine hcl) .... 1/2 to 1 tablet by mouth at bedtime as needed for shoulder pain - do not drive or drink alcohol while taking 7)  Diclofenac Sodium 75 Mg Tbec (Diclofenac sodium) .Marland Kitchen.. 1 tablet by mouth two times a day with food as needed for shoulder pain 8)  Omeprazole 20 Mg Cpdr (Omeprazole) .Marland Kitchen.. 1 tablet by mouth daily for your stomach  Patient Instructions: 1)  Please schedule a follow-up appointment in 2 months regarding your weight and diabetes. 2)  Increase your Lantus by 2 units each day if your fasting blood sugar in the morning is > 150. 3)  Stop titrating up once your blood sugar is < 150 in the mornig or if you are having low blood sugar (< 70) in the afternoons. 4)  Continue doing a good job with exercise - try to get in some walking on days you don't go to the gym. 5)  Eat at least one vegetable with lunch and dinner. 6)  Try to eat some protein at lunch (lean meat, hard boiled egg, peanut butter, yogurt, low fat cheese). Prescriptions: OMEPRAZOLE 20 MG CPDR (OMEPRAZOLE) 1 tablet by mouth daily for your stomach  #30 x 0   Entered and Authorized by:   [name] MD   Signed by:   [name] MD on 09/09/2008   Method used:   [facility]        [facility] [address] [phone]* (retail)       [address]       [place], [place]  [address]       Ph: [phone] or [phone]       Fax: [phone]   RxID:   [id number]   Laboratory Results   Blood Tests   Date/Time Received: September 09, 2008 4:18 PM  Date/Time Reported: September 09, 2008 4:35 PM   HGBA1C: 10.5%   (Normal Range: Non-Diabetic - 3-6%   Control Diabetic - 6-8%)  Comments: ...............test performed by......Marland KitchenBonnie A. [place], [place] (ASCP)

## 2010-06-14 NOTE — Progress Notes (Signed)
  Phone Note Call from Patient   Caller: Patient Call For: [phone] Summary of Call: Pt seen on Friday and was told rx for Lovastatin would be sent to pharmacy.  Called Friday evening and Saturday and rx still was there. [facility] [address]   Initial call taken by: Britta Mccreedy mcgregor  Follow-up for Phone Call        to pcp. the notes say the rx was put on hold. what does that mean? ok to fill? Follow-up by: Golden Circle RN,  February 01, 2010 10:31 AM  Additional Follow-up for Phone Call Additional follow up Details #1::        I have re-sent it without the note to hold.  Leftover from previous when she wasn't going to pick it up immediately. Additional Follow-up by: Delbert Harness MD,  February 01, 2010 10:42 AM    New/Updated Medications: LOVASTATIN 40 MG  TABS (LOVASTATIN) 1 tablet at bedtime Prescriptions: LOVASTATIN 40 MG  TABS (LOVASTATIN) 1 tablet at bedtime  #30 x 5   Entered and Authorized by:   Delbert Harness MD   Signed by:   Delbert Harness MD on 02/01/2010   Method used:   [facility]        [facility] [address] [phone]* (retail)       [address]       [place], [place]  [address]       Ph: [phone] or [phone]       Fax: [phone]   RxID:   [id number]

## 2010-06-14 NOTE — Assessment & Plan Note (Signed)
Summary: knot in breast wp  Flu Vaccine Consent Questions     Do you have a history of severe allergic reactions to this vaccine? no    Any prior history of allergic reactions to egg and/or gelatin? no    Do you have a sensitivity to the preservative Thimersol? no    Do you have a past history of Guillan-Barre Syndrome? no    Do you currently have an acute febrile illness? no    Have you ever had a severe reaction to latex? no    Vaccine information given and explained to patient? yes    Are you currently pregnant? no  Lot Number:[id number] Site Given left Deltoid, IM ...................................................................THEKLA SLADE CMA,  March 20, 2007 9:25 AM    Vital Signs:  Patient Profile:   55 Years Old Female Height:     66 inches Weight:      196.2 pounds Pulse rate:   84 / minute BP sitting:   122 / 82  Pt. in pain?   yes    Intensity:   7  Vitals Entered By: Arlyss Repress CMA, (March 20, 2007 8:41 AM)              Is Patient Diabetic? Yes      Procedure Note Last Tetanus: Done. (05/15/2001)  Cyst Removal:    Size (in cm): 0.1 x 0.1  Incision & Drainage: The patient complains of pain, redness, irritation, inflammation, tenderness, swelling, and discharge but denies fever. Date of onset: 03/17/2007  Procedure # 1: I & D with packing    Size (in cm): 3.0 x 3.0    Region: anterior    Location: RLQ    Comment: firm, with pustule, pain, and erythema. obtained culture.     Instrument used: #11 blade    Anesthesia: 1% lidocaine w/epinephrine  Procedure # 2: I & D with packing    Size (in cm): 1.0 x 1.0    Region: left    Location: breast    Comment: already draining pus    Instrument used: hemostats    Anesthesia: none  Cleaned and prepped with: betadine Wound dressing: bulky gauze dressing Instructions: daily dressing changes and RTC in 7-10 days Additional Instructions: take antibiotics bactrim DS 2 tablets by mouth two times a day  , call if any swelling or rash occurs..     Chief Complaint:  check boil left abdomen;refill meds; flu shot.  History of Present Illness: pt has 3 areas of boils that appeared 2 weeks ago, painful.  located on L abdomen, center upper abdomen, and L breast.  no fevers, but felt as if having chills.  draining whitish yellowish  pus.  not been on antibiotics lately.  pt has had this happen before when she was in her 38s.  Current Allergies: ! ACE INHIBITORS ! * STATINS      Physical Exam  General:     Well-developed,well-nourished,in no acute distress; alert,appropriate and cooperative throughout examination Skin:     see procedures form.  3 areas 1 on L breast 1 cm by 1 cm already draining pus, 1 on epigastric region, 1 on RLQ 3cm by 3 cm.      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  will start antibiotics and given vicodin and ultram for pain.  follow up in 1 week.  daily dressing changes.   Her updated  medication list for this problem includes:    Bactrim Ds 800-160 Mg Tabs (Sulfamethoxazole-trimethoprim) .Marland Kitchen... 2 tablets by mouth twice a day for 10 days.  Orders: Gram Stain-[facility] ([id number]) Culture, Wound -[facility] (91478) [facility]- Est Level  3 (29562) I&D Abcess, simple- [facility] (10060)   Complete Medication List: 1)  Actos 30 Mg Tabs (Pioglitazone hcl) .... Take 1 tablet by mouth once a day 2)  Aspirin Ec 81 Mg Tbec (Aspirin) .... Take 1 tablet by mouth once a day 3)  Clotrimazole Anti-fungal 1 % Crea (Clotrimazole) .... Apply liberally to affected area twice a day 4)  Hydrocortisone 2.5 % Crea (Hydrocortisone) .... Apply a small amount to affected area twice a day 5)  Hyzaar 100-25 Mg Tabs (Losartan potassium-hctz) .... Take 1 tablet by mouth once a day 6)  Lantus 100 Unit/ml Soln (Insulin glargine) .Marland Kitchen.. 10 unit subcutaneously once a day 7)  Metformin Hcl 500 Mg Tabs (Metformin  hcl) .... Take 1 tablet by mouth twice a day 8)  Lovastatin 20 Mg Tabs (Lovastatin) .Marland Kitchen.. 1 tablet at bedtime 9)  Bactrim Ds 800-160 Mg Tabs (Sulfamethoxazole-trimethoprim) .... 2 tablets by mouth twice a day for 10 days. 10)  Vicodin 5-500 Mg Tabs (Hydrocodone-acetaminophen) .Marland Kitchen.. 1-2 tablets every 4-6 hours for pain, will make you drowsy. 11)  Ultram 50 Mg Tabs (Tramadol hcl) .Marland Kitchen.. 1 tablet every 6 hours for pain, will not make you drowsy.  Other Orders: Flu Vaccine & Administration [phone])   Patient Instructions: 1)  take all of your antibiotic.  2 tablets twice a day of bactrim ds.   2)  take vicodin as needed 1 tablet every 4-6 hrs for pain.   3)  take ultram for pain during the da.  it will not make you drowsy.   4)  Please schedule a follow-up appointment in  1 week with dr love.  5)  keep area clean.  change dressing twice a day.  cut off 1 inch of gauze every day.      Prescriptions: ULTRAM 50 MG  TABS (TRAMADOL HCL) 1 tablet every 6 hours for pain, will NOT make you drowsy.  #30 x 0   Entered and Authorized by:   Wilhemina Bonito  MD   Signed by:   Wilhemina Bonito  MD on 03/20/2007   Method used:   Print then Give to Patient   RxID:   [id number] VICODIN 5-500 MG TABS (HYDROCODONE-ACETAMINOPHEN) 1-2 tablets every 4-6 hours for pain, will make you drowsy.  #30 x 0   Entered and Authorized by:   Wilhemina Bonito  MD   Signed by:   Wilhemina Bonito  MD on 03/20/2007   Method used:   Print then Give to Patient   RxID:   [id number] BACTRIM DS 800-160 MG  TABS (SULFAMETHOXAZOLE-TRIMETHOPRIM) 2 tablets by mouth twice a day for 10 days.  #40 x 0   Entered and Authorized by:   Wilhemina Bonito  MD   Signed by:   Wilhemina Bonito  MD on 03/20/2007   Method used:   Print then Give to Patient   RxID:   [id number]  ]

## 2010-06-14 NOTE — Assessment & Plan Note (Signed)
Summary: f/u meds,df   Vital Signs:  Patient profile:   55 year old female Weight:      222.7 pounds Temp:     98.1 degrees F oral Pulse rate:   70 / minute Pulse rhythm:   regular BP sitting:   144 / 85  (right arm) Cuff size:   regular  Vitals Entered By: Loralee Pacas CMA (January 28, 2010 11:31 AM) CC: follow-up visit, meds Is Patient Diabetic? Yes Did you bring your meter with you today? No   Primary Care Provider:  Delbert Harness MD  CC:  follow-up visit and meds.  History of Present Illness: 55 yo here fro follow-up with chronic med conditions  HYPERTENSION Meds: Taking and tolerating? yes Home BP's: taken at work.  Shes today's is much higher than usual Chest Pain: no Dyspnea: no  DIABETES Meds:taking metformin 2000 two times a day and has self titrated up to Lantus 30 units  Taking and tolerating?yes Blood sugars: low 100's.   Hypoglycemic symptoms: no Visual problems: no Monitoring feet: yes Numbness/Tingling: no  HYPERLIPIDEMIA Meds:pravastatin no LUQ pain, mylagias.     Habits & Providers  Alcohol-Tobacco-Diet     Tobacco Status: never  Current Medications (verified): 1)  Aspirin Ec 81 Mg Tbec (Aspirin) .... Take 1 Tablet By Mouth Once A Day 2)  Lantus 100 Unit/ml Soln (Insulin Glargine) .... 30 Units Subcutaneously Qam - Disp 30 Day Supply 3)  Metformin Hcl 1000 Mg Tabs (Metformin Hcl) .... Take 2 Tablet By Mouth Two Times A Day With Food 4)  Hyzaar 100-25 Mg Tabs (Losartan Potassium-Hctz) .... Take 1 Tablet By Mouth Once A Day.  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 1 Tablet At Bedtime - Place Script On Hold 6)  Flexeril 10 Mg Tabs (Cyclobenzaprine Hcl) .... 1/2 To 1 Tablet By Mouth At Bedtime As Needed For Shoulder Pain - Do Not Drive or Drink Alcohol While Taking  Allergies: 1)  ! Ace Inhibitors 2)  ! * Statins PMH-FH-SH reviewed for relevance  Physical Exam  General:   Overweight-appearing, no acute distress.  Alert and oriented x 3.  Lungs:  Clear to auscultation bilaterally.  Normal work of breathing.  No wheezes, rales, or rhonchi.  Heart:  Regular rate and rhythm.  No murmur, rub, or gallop.  2+ Dorsalis Pedis pulses.  Abdomen:  soft, non-tender, normal bowel sounds, no distention, no masses, no guarding, no rigidity, no rebound tenderness, no abdominal hernia, no inguinal hernia, no hepatomegaly, and no splenomegaly.  Extremities:  no edema   Impression & Recommendations:  Problem # 1:  HYPERTENSION, BENIGN SYSTEMIC (ICD-401.1) Assessment Deteriorated  Today above goal but usually well cotnrolled.  patient will monitor at work and return if continues to be elevated.  Her updated medication list for this problem includes:    Hyzaar 100-25 Mg Tabs (Losartan potassium-hctz) .Marland Kitchen... Take 1 tablet by mouth once a day.  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  Will titrate up lantus and work on lifestyle modifcation and if no improvement, will further increase lantus at next visit. Her updated medication list for this problem includes:    Aspirin Ec 81 Mg Tbec (Aspirin) .Marland Kitchen... Take 1 tablet by mouth once a day    Lantus 100 Unit/ml Soln (Insulin glargine) .Marland KitchenMarland KitchenMarland KitchenMarland Kitchen 30 units subcutaneously qam -  disp 30 day supply    Metformin Hcl 1000 Mg Tabs (Metformin hcl) .Marland Kitchen... Take 2 tablet by mouth two times a day with food    Hyzaar 100-25 Mg Tabs (Losartan potassium-hctz) .Marland Kitchen... Take 1 tablet by mouth once a day.  patient's job writes prescription and obtains for patient  Orders: A1C-[facility] (14782) [facility]- Est  Level 4 (95621)  Problem # 3:  HYPERLIPIDEMIA NEC/NOS (ICD-272.4) Assessment: Improved  at goal per labs drawn by work on 11/11/2009 Her updated medication list for this problem includes:    Lovastatin 40 Mg Tabs (Lovastatin)  .Marland Kitchen... 1 tablet at bedtime - place script on hold  Orders: [facility]- Est  Level 4 (30865)  Complete Medication List: 1)  Aspirin Ec 81 Mg Tbec (Aspirin) .... Take 1 tablet by mouth once a day 2)  Lantus 100 Unit/ml Soln (Insulin glargine) .... 30 units subcutaneously qam - disp 30 day supply 3)  Metformin Hcl 1000 Mg Tabs (Metformin hcl) .... Take 2 tablet by mouth two times a day with food 4)  Hyzaar 100-25 Mg Tabs (Losartan potassium-hctz) .... Take 1 tablet by mouth once a day.  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 1 tablet at bedtime - place script on hold 6)  Flexeril 10 Mg Tabs (Cyclobenzaprine hcl) .... 1/2 to 1 tablet by mouth at bedtime as needed for shoulder pain - do not drive or drink alcohol while taking  Patient Instructions: 1)  if you notice your blood pressure higher than 134/90 regularly, please come ack so we ca adjust your medicine. 2)  You are almost at your goal for diabetes.  If you notice you morning fastingblood sugar > 120, increase your lantus by one unit each day it is over.   3)  follow-up in 3 months 4)  getyour flu shot in october Prescriptions: LOVASTATIN 40 MG  TABS (LOVASTATIN) 1 tablet at bedtime - place script on hold  #30 x 11   Entered and Authorized by:   Delbert Harness MD   Signed by:   Delbert Harness MD on 01/28/2010   Method used:   [facility]        [facility] [address] [phone]* (retail)       [address]       [place], [place]  [address]       Ph: [phone] or [phone]       Fax: [phone]   RxID:   [id number]    Prevention & Chronic Care Immunizations   Influenza vaccine: Fluvax Non-MCR  (02/22/2009)   Influenza vaccine due: 02/22/2010    Tetanus booster: 05/15/2001: Done.   Tetanus booster due: 05/16/2011    Pneumococcal vaccine: Not documented  Colorectal Screening   Hemoccult: Not documented   Hemoccult due: Not Indicated     Colonoscopy: normal  (05/22/2006)   Colonoscopy due: 05/22/2016  Other Screening   Pap smear: normal  (06/15/2008)   Pap smear due: 06/16/2011    Mammogram: 76098.0^MM BREAST SURGICAL SPECIMEN  (09/29/2009)   Mammogram due: 09/15/2009   Smoking status: never  (01/28/2010)  Diabetes Mellitus   HgbA1C: 7.8  (01/28/2010)   Hemoglobin A1C due: 09/07/2008    Eye exam: normal  (10/26/2009)   Eye exam due: 10/2010    Foot exam: yes  (08/05/2008)   High risk foot: Not documented   Foot care education: completed  (12/09/2007)   Foot exam  due: 03/03/2009    Urine microalbumin/creatinine ratio: 20  (10/29/2009)   Urine microalbumin/cr due: 10/30/2010    Diabetes flowsheet reviewed?: Yes   Progress toward A1C goal: Unchanged  Lipids   Total Cholesterol: 223  (11/30/2008)   LDL: 131  (11/30/2008)   LDL Direct: Not documented   HDL: 77  (11/30/2008)   Triglycerides: 53  (05/28/2008)    SGOT (AST): 30  (11/30/2008)   SGPT (ALT): 30  (11/30/2008)   Alkaline phosphatase: 112  (11/30/2008)   Total bilirubin: 0.2  (11/30/2008)    Lipid flowsheet reviewed?: Yes   Progress toward LDL goal: Improved  Hypertension   Last Blood Pressure: 144 / 85  (01/28/2010)   Serum creatinine: 0.82  (05/28/2008)   Serum potassium 4.6  (11/30/2008)    Hypertension flowsheet reviewed?: Yes   Progress toward BP goal: Deteriorated  Self-Management Support :   Personal Goals (by the next clinic visit) :     Personal A1C goal: 7  (10/29/2009)     Personal blood pressure goal: 130/80  (10/29/2009)     Personal LDL goal: 100  (10/29/2009)    Patient will work on the following items until the next clinic visit to reach self-care goals:     Medications and monitoring: take my medicines every day, check my blood sugar  (01/28/2010)     Eating: drink diet soda or water instead of juice or soda, eat more vegetables, use fresh or frozen vegetables, eat foods that are low in salt  (01/28/2010)      Activity: take a 30 minute walk every day  (01/28/2010)    Diabetes self-management support: Copy of home glucose meter record  (02/22/2009)    Hypertension self-management support: Written self-care plan  (10/29/2009)    Lipid self-management support: Not documented    Prevention & Chronic Care Immunizations   Influenza vaccine: Fluvax Non-MCR  (02/22/2009)   Influenza vaccine due: 02/22/2010    Tetanus booster: 05/15/2001: Done.   Tetanus booster due: 05/16/2011    Pneumococcal vaccine: Not documented  Colorectal Screening   Hemoccult: Not documented   Hemoccult due: Not Indicated    Colonoscopy: normal  (05/22/2006)   Colonoscopy due: 05/22/2016  Other Screening   Pap smear: normal  (06/15/2008)   Pap smear due: 06/16/2011    Mammogram: 76098.0^MM BREAST SURGICAL SPECIMEN  (09/29/2009)   Mammogram due: 09/15/2009   Smoking status: never  (01/28/2010)  Diabetes Mellitus   HgbA1C: 7.8  (01/28/2010)   Hemoglobin A1C due: 09/07/2008    Eye exam: normal  (10/26/2009)   Eye exam due: 10/2010    Foot exam: yes  (08/05/2008)   High risk foot: Not documented   Foot care education: completed  (12/09/2007)   Foot exam due: 03/03/2009    Urine microalbumin/creatinine ratio: 20  (10/29/2009)   Urine microalbumin/cr due: 10/30/2010    Diabetes flowsheet reviewed?: Yes   Progress toward A1C goal: Unchanged  Lipids   Total Cholesterol: 223  (11/30/2008)   LDL: 131  (11/30/2008)   LDL Direct: Not documented   HDL: 77  (11/30/2008)   Triglycerides: 53  (05/28/2008)    SGOT (AST): 30  (11/30/2008)   SGPT (ALT): 30  (11/30/2008)   Alkaline phosphatase: 112  (11/30/2008)   Total bilirubin: 0.2  (11/30/2008)    Lipid flowsheet reviewed?: Yes   Progress toward LDL goal: Improved  Hypertension   Last Blood Pressure: 144 / 85  (01/28/2010)   Serum creatinine: 0.82  (05/28/2008)   Serum  potassium 4.6  (11/30/2008)    Hypertension flowsheet reviewed?: Yes   Progress  toward BP goal: Deteriorated  Self-Management Support :   Personal Goals (by the next clinic visit) :     Personal A1C goal: 7  (10/29/2009)     Personal blood pressure goal: 130/80  (10/29/2009)     Personal LDL goal: 100  (10/29/2009)    Patient will work on the following items until the next clinic visit to reach self-care goals:     Medications and monitoring: take my medicines every day, check my blood sugar  (01/28/2010)     Eating: drink diet soda or water instead of juice or soda, eat more vegetables, use fresh or frozen vegetables, eat foods that are low in salt  (01/28/2010)     Activity: take a 30 minute walk every day  (01/28/2010)    Diabetes self-management support: Copy of home glucose meter record  (02/22/2009)    Hypertension self-management support: Written self-care plan  (10/29/2009)    Lipid self-management support: Not documented    Laboratory Results   Blood Tests   Date/Time Received: January 28, 2010 11:37 AM  Date/Time Reported: January 28, 2010 11:49 AM   HGBA1C: 7.8%   (Normal Range: Non-Diabetic - 3-6%   Control Diabetic - 6-8%)  Comments: ...............test performed by......Marland KitchenBonnie A. [place], MLS (ASCP)cm

## 2010-06-16 NOTE — Consult Note (Signed)
Summary: [facility]  [facility]   Imported By: De Nurse 05/04/2010 10:34:25  _____________________________________________________________________  External Attachment:    Type:   Image     Comment:   External Document

## 2010-06-16 NOTE — Letter (Signed)
Summary: Colonoscopy  [facility]   Imported By: Knox Royalty 03/25/2008 15:12:19  _____________________________________________________________________  External Attachment:    Type:   Image     Comment:   External Document  Appended Document: [facility]  Colonoscopy Result Date:  05/22/2006 Colonoscopy Result:  normal Colonoscopy Next Due:  10 yr

## 2010-06-16 NOTE — Assessment & Plan Note (Signed)
Summary: DM/eo   Vital Signs:  Patient profile:   55 year old female Height:      66 inches Weight:      206 pounds BMI:     33.37 Temp:     98.4 degrees F oral Pulse rate:   68 / minute BP sitting:   126 / 74  (right arm) Cuff size:   large  Vitals Entered By: Tessie Fass CMA (April 28, 2010 1:52 PM) CC: F/U DM Is Patient Diabetic? Yes Pain Assessment Patient in pain? no        Primary Care Xadrian Craighead:  Delbert Harness MD  CC:  F/U DM.  History of Present Illness: 55 yo here for follow-up  DIABETES Meds: Lantus 25 , metofrmin 1000 bid Taking and tolerating? yes Blood sugars: 120's fasting usually Hypoglycemic symptoms: yes, when cbg's less than 90's.  usually  Visual problems: no Monitoring feet:yes Numbness/Tingling: sometimes in hands and feet Last eye exam: June 2011 Last A1c: 7.8 now 7.3 Flu/PNA vaccines? needs today  HYPERTENSION Meds: Taking and tolerating? yes Home BP's: no Chest Pain: no  Dyspnea: no   HYPERLIPIDEMIA Meds: pravastatin Taking and tolerating? yes Diet pattern: working on Exercise: zumba twice a week for an hour  She brings in labs from work for me to review    Habits & Providers  Alcohol-Tobacco-Diet     Tobacco Status: never  Current Medications (verified): 1)  Aspirin Ec 81 Mg Tbec (Aspirin) .... Take 1 Tablet By Mouth Once A Day 2)  Lantus 100 Unit/ml Soln (Insulin Glargine) .... 25-30 Units Subcutaneously Qam - Disp 30 Day Supply 3)  Metformin Hcl 1000 Mg Tabs (Metformin Hcl) .... Take 2 Tablet By Mouth Two Times A Day With Food 4)  Hyzaar 100-25 Mg Tabs (Losartan Potassium-Hctz) .... Take 1 Tablet By Mouth Once A Day.  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 1 Tablet At Bedtime 6)  Flexeril 10 Mg Tabs (Cyclobenzaprine Hcl) .... 1/2 To 1 Tablet By Mouth At Bedtime As Needed For Shoulder Pain - Do Not Drive or Drink Alcohol While Taking 7)  Tamoxifen Citrate  20 Mg Tabs (Tamoxifen Citrate) .... One By Mouth Daily 8)  Ergocalciferol 50000 Unit Caps (Ergocalciferol) .... Take One Tablet Weekly For 8 Weeks  Allergies: 1)  ! Ace Inhibitors 2)  ! * Statins PMH-FH-SH reviewed for relevance  Review of Systems      See HPI  Physical Exam  General:  Overweight-appearing, no acute distress.  Alert and oriented x 3.  Lungs:  Clear to auscultation bilaterally.  Normal work of breathing.  No wheezes, rales, or rhonchi.  Heart:  Regular rate and rhythm.  No murmur, rub, or gallop.  2+ Dorsalis Pedis pulses.  Abdomen:  soft, non-tender, normal bowel sounds, no distention, no masses, no guarding, no rigidity, no rebound tenderness, no abdominal hernia, no inguinal hernia, no hepatomegaly, and no splenomegaly.   Diabetes Management Exam:    Foot Exam (with socks and/or shoes not present):       Sensory-Pinprick/Light touch:          Left medial foot (L-4): normal          Left dorsal foot (L-5): normal          Left lateral foot (S-1): normal          Right medial foot (L-4): normal          Right dorsal foot (  L-5): normal          Right lateral foot (S-1): normal       Sensory-Monofilament:          Left foot: normal          Right foot: normal       Inspection:          Left foot: normal          Right foot: normal       Nails:          Left foot: normal          Right foot: normal   Impression & Recommendations:  Problem # 1:  DIABETES MELLITUS, II, 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  I think her feelings of hypoglycemia in 80's-90's may be due to high glycemic index foods.  Advsied to track foods discussed high fiber and protein foods- given handout.  patient to try to increase to 38 units if tolerated.  Her updated medication list for this problem includes:    Aspirin Ec 81 Mg Tbec (Aspirin) .Marland Kitchen... Take 1 tablet by mouth once a day    Lantus 100 Unit/ml Soln (Insulin glargine)  .Marland Kitchen... 25-30 units subcutaneously qam - disp 30 day supply    Metformin Hcl 1000 Mg Tabs (Metformin hcl) .Marland Kitchen... Take 2 tablet by mouth two times a day with food    Hyzaar 100-25 Mg Tabs (Losartan potassium-hctz) .Marland Kitchen... Take 1 tablet by mouth once a day.  patient's job writes prescription and obtains for patient  Orders: [facility]C-[facility] (16109) [facility]- Est  Level 4 (60454)  Problem # 2:  HYPERLIPIDEMIA NEC/NOS (ICD-272.4)  Lipid Panel this month from work excellent LDL 85, HDL 65.  Her updated medication list for this problem includes:    Lovastatin 40 Mg Tabs (Lovastatin) .Marland Kitchen... 1 tablet at bedtime  Orders: [facility]- Est  Level 4 (09811)  Problem # 3:  OBESITY, NOS (ICD-278.00)  discussed increasing exercise.  Orders: [facility]- Est  Level 4 (99214)  Ht: 66 (04/28/2010)   Wt: 206 (04/28/2010)   BMI: 33.37 (04/28/2010)  Problem # 4:  HYPERTENSION, BENIGN SYSTEMIC (ICD-401.1) Assessment: Unchanged  well controlled.  Her updated medication list for this problem includes:    Hyzaar 100-25 Mg Tabs (Losartan potassium-hctz) .Marland Kitchen... Take 1 tablet by mouth once a day.  patient's job writes prescription and obtains for patient  Orders: [facility]- Est  Level 4 (91478)  Problem # 5:  VITAMIN D DEFICIENCY (ICD-268.9)  drawn at work.  Will presribe 8 week replacement and discusssed maintenanc evitains  Orders: [facility]- Est  Level 4 (29562)  Problem # 6:  BENIGN NEOPLASM OF BREAST (ICD-217) she seems to be copuing with diagnosis well.  Mild ankle I suspect is due to this.  DIscussed mild ompression stockings.  Complete Medication List: 1)  Aspirin Ec 81 Mg Tbec (Aspirin) .... Take 1 tablet by mouth once a day 2)  Lantus 100 Unit/ml Soln (Insulin glargine) .... 25-30 units subcutaneously qam - disp 30 day supply 3)  Metformin Hcl 1000 Mg Tabs (Metformin hcl) .... Take 2 tablet by mouth two times a day with food 4)  Hyzaar 100-25 Mg Tabs (Losartan potassium-hctz) .... Take 1 tablet by mouth once a day.  patient's job  writes prescription and obtains for patient 5)  Lovastatin 40 Mg Tabs (Lovastatin) .Marland Kitchen.. 1 tablet at bedtime 6)  Flexeril 10 Mg Tabs (Cyclobenzaprine hcl) .... 1/2 to 1 tablet by  mouth at bedtime as needed for shoulder pain - do not drive or drink alcohol while taking 7)  Tamoxifen Citrate 20 Mg Tabs (Tamoxifen citrate) .... One by mouth daily 8)  Ergocalciferol 50000 Unit Caps (Ergocalciferol) .... Take one tablet weekly for 8 weeks  Patient Instructions: 1)  Your a1c is improved today- 7.3%.  Try 28 units a day and see if that is a good balance for you- goal blood sugars 80-120 in AM. 2)  Think about what foods make you feel jittery- try to stick to high protein, high fiber foods that digest slowly 3)  Use compression stockings to help with feet swelling  4)  Will send prescription for vitamin D.  After you are done with this, you can take Vitamin D 1000 mg daily avilable OTC. 5)  Follow-up in 3-4 months or sooner if needed. Prescriptions: ERGOCALCIFEROL 50000 UNIT CAPS (ERGOCALCIFEROL) take one tablet weekly for 8 weeks  #8 x 0   Entered and Authorized by:   Delbert Harness MD   Signed by:   Delbert Harness MD on 04/28/2010   Method used:   Electronically to        [facility] [phone]* (retail)       [address]       [place], [place]  [address]       Ph: [phone] or [phone]       Fax: [phone]   RxID:   [id number]    Orders Added: 1)  A1C-[facility] [83036] 2)  [facility]- Est  Level 4 [36644]    Laboratory Results   Blood Tests   Date/Time Received: April 28, 2010 1:47 PM  Date/Time Reported: April 28, 2010 2:18 PM   HGBA1C: 7.3%   (Normal Range: Non-Diabetic - 3-6%   Control Diabetic - 6-8%)  Comments: ...............test performed by......Marland KitchenBonnie A. [place], MLS (ASCP)cm       Prevention & Chronic Care Immunizations   Influenza vaccine: Fluvax Non-MCR  (02/22/2009)   Influenza vaccine due: 02/22/2010    Tetanus booster:  05/15/2001: Done.   Tetanus booster due: 05/16/2011    Pneumococcal vaccine: Not documented  Colorectal Screening   Hemoccult: Not documented   Hemoccult due: Not Indicated    Colonoscopy: normal  (05/22/2006)   Colonoscopy due: 05/22/2016  Other Screening   Pap smear: normal  (06/15/2008)   Pap smear due: 06/16/2011    Mammogram: 76098.0^MM BREAST SURGICAL SPECIMEN  (09/29/2009)   Mammogram due: 09/15/2009   Smoking status: never  (04/28/2010)  Diabetes Mellitus   HgbA1C: 7.3  (04/28/2010)   Hemoglobin A1C due: 09/07/2008    Eye exam: normal  (10/26/2009)   Eye exam due: 10/2010    Foot exam: yes  (04/28/2010)   High risk foot: Not documented   Foot care education: completed  (12/09/2007)   Foot exam due: 03/03/2009    Urine microalbumin/creatinine ratio: 20  (10/29/2009)   Urine microalbumin/cr due: 10/30/2010    Diabetes flowsheet reviewed?: Yes   Progress toward A1C goal: Improved  Lipids   Total Cholesterol: 177  (11/11/2009)   LDL: 93  (11/11/2009)   LDL Direct: Not documented   HDL: 69  (11/11/2009)   Triglycerides: 73  (11/11/2009)    SGOT (AST): 17  (04/14/2010)   SGPT (ALT): 13  (04/14/2010)   Alkaline phosphatase: 61  (04/14/2010)   Total bilirubin: 0.2  (04/14/2010)    Lipid flowsheet reviewed?: Yes   Progress toward LDL goal:  At goal  Hypertension   Last Blood Pressure: 126 / 74  (04/28/2010)   Serum creatinine: 0.88  (04/14/2010)   Serum potassium 4.2  (04/14/2010)    Hypertension flowsheet reviewed?: Yes   Progress toward BP goal: At goal  Self-Management Support :   Personal Goals (by the next clinic visit) :     Personal A1C goal: 7  (10/29/2009)     Personal blood pressure goal: 130/80  (10/29/2009)     Personal LDL goal: 100  (10/29/2009)    Diabetes self-management support: Copy of home glucose meter record  (02/22/2009)    Diabetes self-management support not done because: Good outcomes  (04/28/2010)    Hypertension  self-management support: Written self-care plan  (10/29/2009)    Lipid self-management support: Not documented

## 2010-08-18 ENCOUNTER — Other Ambulatory Visit: Payer: Self-pay | Admitting: Family Medicine

## 2010-08-18 MED ORDER — LOVASTATIN 40 MG PO TABS: 40.0000 mg | ORAL_TABLET | Freq: Every day | ORAL | Status: DC

## 2010-08-24 ENCOUNTER — Ambulatory Visit: Payer: Self-pay | Admitting: Family Medicine

## 2010-09-02 ENCOUNTER — Ambulatory Visit (INDEPENDENT_AMBULATORY_CARE_PROVIDER_SITE_OTHER): Payer: PRIVATE HEALTH INSURANCE | Admitting: Family Medicine

## 2010-09-02 ENCOUNTER — Encounter: Payer: Self-pay | Admitting: Family Medicine

## 2010-09-02 ENCOUNTER — Other Ambulatory Visit: Payer: Self-pay | Admitting: Family Medicine

## 2010-09-02 VITALS — BP 126/82 | HR 80 | Temp 97.6°F | Ht 67.0 in | Wt 202.1 lb

## 2010-09-02 DIAGNOSIS — M542 Cervicalgia: Secondary | ICD-10-CM

## 2010-09-02 DIAGNOSIS — IMO0001 Reserved for inherently not codable concepts without codable children: Secondary | ICD-10-CM

## 2010-09-02 DIAGNOSIS — Z1231 Encounter for screening mammogram for malignant neoplasm of breast: Secondary | ICD-10-CM

## 2010-09-02 DIAGNOSIS — E1165 Type 2 diabetes mellitus with hyperglycemia: Secondary | ICD-10-CM

## 2010-09-02 DIAGNOSIS — M791 Myalgia, unspecified site: Secondary | ICD-10-CM

## 2010-09-02 LAB — POCT GLYCOSYLATED HEMOGLOBIN (HGB A1C): Hemoglobin A1C: 7.5

## 2010-09-02 LAB — TSH: TSH: 0.654 u[IU]/mL (ref 0.350–4.500)

## 2010-09-02 LAB — CBC
HCT: 36.1 % (ref 36.0–46.0)
Hemoglobin: 11.5 g/dL — ABNORMAL LOW (ref 12.0–15.0)
MCH: 25.3 pg — ABNORMAL LOW (ref 26.0–34.0)
MCHC: 31.9 g/dL (ref 30.0–36.0)
MCV: 79.5 fL (ref 78.0–100.0)
Platelets: 275 10*3/uL (ref 150–400)
RBC: 4.54 MIL/uL (ref 3.87–5.11)
RDW: 15.4 % (ref 11.5–15.5)
WBC: 3.5 10*3/uL — ABNORMAL LOW (ref 4.0–10.5)

## 2010-09-02 LAB — COMPREHENSIVE METABOLIC PANEL
ALT: 13 U/L (ref 0–35)
AST: 19 U/L (ref 0–37)
Albumin: 4.1 g/dL (ref 3.5–5.2)
Alkaline Phosphatase: 56 U/L (ref 39–117)
BUN: 20 mg/dL (ref 6–23)
CO2: 26 mEq/L (ref 19–32)
Calcium: 9.6 mg/dL (ref 8.4–10.5)
Chloride: 103 mEq/L (ref 96–112)
Creat: 0.87 mg/dL (ref 0.40–1.20)
Glucose, Bld: 69 mg/dL — ABNORMAL LOW (ref 70–99)
Potassium: 3.8 mEq/L (ref 3.5–5.3)
Sodium: 140 mEq/L (ref 135–145)
Total Bilirubin: 0.3 mg/dL (ref 0.3–1.2)
Total Protein: 7.3 g/dL (ref 6.0–8.3)

## 2010-09-02 LAB — SEDIMENTATION RATE: Sed Rate: 11 mm/hr (ref 0–22)

## 2010-09-02 LAB — VITAMIN B12: Vitamin B-12: >2000 pg/mL — ABNORMAL HIGH (ref 211–911)

## 2010-09-02 LAB — CK: Total CK: 149 U/L (ref 7–177)

## 2010-09-02 MED ORDER — CYCLOBENZAPRINE HCL 10 MG PO TABS: ORAL_TABLET | ORAL | Status: DC

## 2010-09-02 NOTE — Patient Instructions (Signed)
Try stopping your cholesterol medicine lovastatin for just one week and see if it makes a difference. I will let you know about bloodwork in the mail if its all normal, otherwise will call. Follow-up 1-2 weeks to follow-up Use flexeril at night to see if it helps with pain- can make you sleepy.

## 2010-09-02 NOTE — Progress Notes (Signed)
  Subjective:    Patient ID: Kathy Duncan, female    DOB: Sep 23, 1955, 55 y.o.   MRN: [id number]  HPIall over body pain worsening.  Has been having it for years.  Taking 4 tablets of tylenol at a time.  Worst at night.  Cannot define any more specifically than "all over" no weakness, but numbness in both feet for past few months.  Notes body feels stuff but no improvement with activity.  Works in  TEFL teacher.  Doing Zumba, going well 3-4 days a week, does not hurt when she does zumba.  When asking directly, states pain is in legs, arms, back, but no headache.  States pain is mostly "when I am relaxed"  Leg cramps also wake her up at night.  DIABETES  Taking and tolerating: yes Fasting blood sugars:80-90's  Hypoglycemic symptoms: no Visual problems: no Monitoring feet: yes Numbness/Tingling: yes Last eye exam: appt June 14th Diabetic Labs:  Lab Results  Component Value Date   HGBA1C 7.3 04/28/2010   HGBA1C 7.6 04/14/2010   HGBA1C 7.8 01/28/2010   Lab Results  Component Value Date   LDLCALC 93 11/11/2009   CREATININE 0.88 04/14/2010   Last microalbumin: No results found for this basename: MICROALBUR, [id number]   HYPERTENSION  BP Readings from Last 3 Encounters:  09/02/10 126/82  04/28/10 126/74  01/28/10 144/85    Hypertension ROS: taking medications as instructed, no medication side effects noted, no TIA's, no chest pain on exertion, no dyspnea on exertion, no swelling of ankles, no intermittent claudication symptoms and BP monitored byw rokplace.       Review of Systemssee hpi     Objective:   Physical Exam  Constitutional: She is oriented to person, place, and time. She appears well-developed and well-nourished.  HENT:  Head: Normocephalic.  Neck: Neck supple.  Cardiovascular: Normal rate and regular rhythm.   Pulmonary/Chest: Effort normal and breath sounds normal. She has no wheezes.  Abdominal: Soft. Bowel sounds are normal. She exhibits no  distension. There is no tenderness. There is no rebound and no guarding.  Musculoskeletal: Normal range of motion. She exhibits no edema and no tenderness.  Neurological: She is alert and oriented to person, place, and time. She has normal strength.      normal sensation LE.  Diabetci foot exam wnl.    Assessment & Plan:

## 2010-09-03 LAB — VITAMIN D 25 HYDROXY (VIT D DEFICIENCY, FRACTURES): Vit D, 25-Hydroxy: 40 ng/mL (ref 30–89)

## 2010-09-05 NOTE — Assessment & Plan Note (Signed)
Significant mylagias, workup today included normal ESR, Vit D, CK, CMET, CBC.  I suspect time of onset likely related to tamoxifen but patient also on statin and has history of statin-induced myalgias.  I have asked patient to stop statin until follow-up in 1-2 weeks.

## 2010-09-05 NOTE — Assessment & Plan Note (Signed)
A1c Slightly above 7.  Given fasting Am blood sugar low- today's random is 69, will not increase lantus.  Continue current tx.

## 2010-09-12 ENCOUNTER — Encounter: Payer: Self-pay | Admitting: Family Medicine

## 2010-09-21 ENCOUNTER — Other Ambulatory Visit: Payer: Self-pay | Admitting: Oncology

## 2010-09-21 DIAGNOSIS — Z9889 Other specified postprocedural states: Secondary | ICD-10-CM

## 2010-09-28 ENCOUNTER — Ambulatory Visit
Admission: RE | Admit: 2010-09-28 | Discharge: 2010-09-28 | Disposition: A | Discharge status: Home / Self Care | Payer: PRIVATE HEALTH INSURANCE | Source: Ambulatory Visit | Attending: Family Medicine | Admitting: Family Medicine

## 2010-09-28 DIAGNOSIS — Z1231 Encounter for screening mammogram for malignant neoplasm of breast: Secondary | ICD-10-CM

## 2010-09-30 NOTE — Discharge Summary (Signed)
[facility]  Patient:    Kathy Duncan, Kathy Duncan                     MRN: [id number] Adm. Date:  [id number] Disc. Date: [id number] Attending:  Maxie Better                           Discharge Summary  ADMISSION DIAGNOSES: 1. Symptomatic uterine fibroids. 2. Adult onset diabetes mellitus.  DISCHARGE DIAGNOSES: 1. Symptomatic uterine fibroids. 2. Adult onset diabetes.  PROCEDURE:  Examination under anesthesia, total abdominal hysterectomy, bilateral salpingectomy.  HISTORY OF PRESENT ILLNESS:  This is a 55 year old gravida 2, para 2 female with adult-onset diabetes with symptomatic uterine fibroids in the form of severe iron-deficiency anemia secondary to menorrhalgia.  After three courses of Lupron Depot treatment, now presents for her surgical procedure and definitive management of her uterine fibroids.  The patient was found to have had about a 30 cm fibroid uterus which then decreased to approximately 24 cm after the treatment course.  The patient has had a hemoglobin as low as 6 and preoperatively had increased to 10 with the cessation of her menses due to the medication given to prepare her for surgery.  Her diabetes has been controlled and managed by Dr. Adela Glimpse.  HOSPITAL COURSE:  The patient was admitted and taken to the operating room where she underwent examination under anesthesia, and a total abdominal hysterectomy, bilateral salpingectomy.  Findings at the time of surgery were that multiple uterine fibroids, normal tubes and ovaries bilaterally, normal retrocecal appendix, normal palpable liver edge, normal palpable kidneys.  Her estimated blood loss was 350 cc.  The patient had an unremarkable postoperative course.  By postoperative day #2, the patient was passing flatus and had had a bowel movement after suppositories.  The patient desires to go home.  Her CBC on postoperative day #1 showed hemoglobin of 8.9.  She had hematocrit of  29.5.  MCV was 70.5 and her fasting glucose was 128.  Platelet count was 181,000.  Pathology was consistent with multiple submucosal, intramural and subserosal fibroids, benign bilateral fallopian tubes, and a benign exocervix and endocervical mucosa.  The uterus weighed 1217 g.  The patient was deemed able to be discharged home.  Her incision had staples.  It was a transverse excision without any erythema, induration or exudate.  DISPOSITION:  Home.  CONDITION:  Stable.  DISCHARGE MEDICATIONS: 1. Dilaudid 2 mg 1-2 tablets q.3-4h. p.r.n. pain, #20. 2. Motrin 800 mg one q.6-8h. p.r.n. pain., #30, two refills. 3. She will resume her medications at home which are Zestril 10 mg p.o. q.d.    Glucotrol XL 10 mg p.o. q.d., Glucophage 2000 mg p.o. q.p.m.  Iron    supplementation, one p.o. q.d.  FOLLOWUP:  Staple removal at [facility] on Thursday, June 29th, and six weeks postoperatively.  DISCHARGE INSTRUCTIONS: 1. Call for temperature greater or equal to 100.4, severe abdominal pain,    nausea, vomiting, increased incisional pain, redness or drainage of fluid    from the incision. 2. Nothing per vagina for 4-6 weeks. 3. No heavy lifting or driving for the first two weeks. 4. No straining with bowel movements. 5. Call if soaking of pad every hour or more frequently. 6. Resume all medications as described previously.  Continue iron    supplementation for at least one year. DD:  11/05/99 TD:  11/06/99 Job: [id number]

## 2010-09-30 NOTE — Op Note (Signed)
[facility]  Patient:    Kathy Duncan, Kathy Duncan                     MRN: [id number] Proc. Date: 11/03/99 Adm. Date:  [id number] Attending:  Maxie Better                           Operative Report  PREOPERATIVE DIAGNOSES:  Symptomatic uterine fibroids, chronic hypertension, adult-onset diabetes mellitus.  POSTOPERATIVE DIAGNOSES:  Symptomatic uterine fibroids, adult-onset diabetes mellitus, chronic hypertension.  PROCEDURE:  Examination under anesthesia, total abdominal hysterectomy and bilateral salpingectomy.  ANESTHESIA:  General.  SURGEON:  Sheronette A. Cherly Hensen, M.D.  ASSISTANT:  Sung Amabile. Roslyn Smiling, M.D.  INDICATION:  This is a 55 year old gravida 2, para 2 female with known uterine fibroids, who has had severe iron deficiency anemia secondary to menorrhagia and who is now being admitted for total abdominal hysterectomy.  The patient had been placed on Depo-Lupron therapy for three months in order to facilitate increasing her hemoglobin as well as to decrease her uterine size.  On initiation presentation, her uterus was about 30 cm high with multiple fibroids; after the Depo-Lupron, it has decreased to 24 cm,  The patient initially had diabetes which was not controlled; however, with close monitoring by Dr. Birdena Jubilee, her diabetes has been now well-controlled. Chronic hypertension is stable.  After a lengthy discussion, the patient opted to preserve her ovarian function.  Risks and benefits of the procedure had been explained to the patient and a consent was signed.  The patient was transferred to the operating room for her procedure.  DESCRIPTION OF PROCEDURE:  Under adequate general anesthesia, the patient was placed in the supine position.  After induction of the general anesthesia, examination under anesthesia was performed which revealed a mobile uterus that was about two fingerbreadths above the umbilicus.  Based on the  mobility, decision was made to perform a Pfannenstiel skin incision.  The patient was then sterilely prepped and draped in the usual fashion.  The vagina was prepped with Betadine solution.  An indwelling Foley catheter was placed sterilely.  Pneumo-boots were in place.  The patient received antibiotic prophylaxis.  A Pfannenstiel skin incision was then made and carried down to the rectus fascia using Bovie cautery.  Rectus fascia was incised in the midline and extended bilaterally.  The rectus fascia was bluntly, and with cautery, dissected off the rectus muscle in a superior and inferior fashion. The rectus muscle was split in the midline.  The parietal peritoneum was bluntly entered and extended superiorly and inferiorly and laterally on the left.  Exploration of the upper abdomen revealed a normal liver edge, normal kidneys and the pelvis was notable for a fibroid uterus with multiple subserosal fibroids noted.  Normal tubes and ovaries were noted bilaterally. A self-retaining Balfour retractor was then placed, the bowels were packed upwardly and the round ligaments were identified bilaterally.  They were grasped, suture-ligated with 0 Vicryl figure-of-eight sutures and severed using cautery.  The anterior leaf of the broad ligament was then extended bilaterally to create a bladder flap.  Due to the prominence of an anterior lower uterine segment subserosal fibroid, the uterus was limited in its mobility while remaining in the abdominal cavity.  The bladder was then bluntly displaced inferiorly and an initial attempt at mobilizing the adnexa was limited due to the uterus, at which point the [facility] self-retaining retractor was then removed.  The fundus  of the uterus was grasped with a triple hook and uterus was exteriorized.  With the exterioration of the uterus, this facilitated the procedure.  The bladder was then additionally with Metzenbaums, dissected off the lower uterine segment  and displaced inferiorly.  Inspection of the ovaries was notable for both shortened uteroovarian ligaments; however, the aim was still to preserve the ovarian function.  The uteroovarian ligament on the right was doubly clamped, cut, free-tied with 0 Vicryl and suture-ligated with 0 Vicryl suture.  The right uterine vessels were then skeletonized.  The uterine vessels on the right were doubly clamped, cut and suture-ligated with 0 Vicryl x 2.  On the contralateral side, again shortened left uteroovarian ligament was noted.  In order to preserve the ovarian function, decision was then made to take the left fallopian tube as a specimen.  In doing so, the lower portion of the fallopian tube was clamped, cut and suture-ligated with 0 Vicryl.  The left uteroovarian ligament was then doubly clamped, cut and free-tied with 0 Vicryl suture and suture-ligated with 0 Vicryl suture.  The posterior leaf of the broad ligament on the left was opened and the inferior portion of the ovarian attachment was then also doubly clamped, cut and suture-ligated with 0 Vicryl suture.  The left uterine vessels were then doubly clamped, cut and suture-ligated with 0 Vicryl suture x 2.  To facilitate further the procedure, the fundus of the uterus was then severed from its cervical attachment using scalpel.  The fundus was then removed.  The remaining portion of the cervix was then grasped with a single-tooth tenaculum and a Jacobson clamp.  The cardinal ligaments were then serially clamped, cut and suture-ligated with 0 Vicryl sutures bilaterally.  The uterosacral ligaments were then isolated, clamped and suture-ligated with 0 Vicryl suture.  The cardinal ligaments were continued to be clamped, cut and suture-ligated with 0 Vicryl bilaterally until the cervicovaginal junction was then reached.  The cervicovaginal junction was opened posteriorly and the cervix was then subsequently severed from its vaginal attachment  circumferentially.  Angle sutures were then placed using 0 Vicryl suture.  The anterior and posterior portions of the vaginal cuff were then reefed using 0 Vicryl running-locked stitch.  The cuff was then  approximated vertically with interrupted 0 Vicryl sutures.  Inspection of the left ovarian pedicle was noted for some bleeding on the inferior aspect of the mesovarian area.  This was grasped with a ring clamp and suture-ligated with 4-0 Vicryl sutures with good hemostasis subsequently noted.  Additional bleeders in the pelvis were also cauterized.  The ureters were then noted to be normal in caliber and deep in the pelvis, peristalsing at the end of the case.  The cuff was reinspected, the bladder flap was also inspected and small bleeders cauterized.  The right ovary was inspected; bleeding was noted at the site of the pedicle and on the peritoneal edge.  This was grasped and inspected.  It was also noted that the fallopian tube was particularly elongated on the right and the decision was then made to remove that because it continued to be flapping near the vaginal cuff.  Using [place], the right fallopian tube was removed in its entirety and the base from which it came was hemostased with 0 Vicryl suture.  Subsequently, the bleeding at the pedicle area of the right ovary was clamped and free-tied with 0 Vicryl suture.  The pelvis was then copiously irrigated and suctioned; reinspection showed good hemostasis.  The  appendix was then visualized and was noted to be elongated and retrocecal.  Packings were then removed from the abdomen.  The self-retaining retractor which had been placed back in the abdomen after the fundus had been removed was also removed.  The rectus fascia was inspected, the rectus muscle was inspected and small bleeders cauterized.  The rectus fascia was then closed with 0 Vicryl x 2.  The subcutaneous area was irrigated and small bleeders cauterized.  The subcuticular  layer was injected with 0.25% Marcaine.  Skin was approximated using Ethicon staples.  Specimen was uterus, cervix and both fallopian tubes.  Estimated blood loss was 350 cc. Urine was 325 cc, clear-yellow urine.  Intraoperative fluid was 3 L.  Sponge and instrument counts x 2 were correct.  Complication was none.  The patient tolerated the procedure well and was transferred to the recovery room in stable condition. DD:  11/03/99 TD:  11/06/99 Job: [id number]

## 2010-09-30 NOTE — Procedures (Signed)
[name], Kathy Duncan              ACCOUNT NO.:  [id number]   MEDICAL RECORD NO.:  [id number]          PATIENT TYPE:  OUT   LOCATION:  SLEEP CENTER                 FACILITY:  [facility]   PHYSICIAN:  Clinton D. Maple Hudson, M.D. DATE OF BIRTH:  April 20, 1956   DATE OF STUDY:  01/05/2005                              NOCTURNAL POLYSOMNOGRAM   DATE OF STUDY:  January 05, 2005.   REFERRING PHYSICIAN:  Dr. Ace Gins.   INDICATION FOR STUDY:  Hypersomnia with sleep apnea. Epworth sleepiness  score 13/24, BMI 32. Weight 209 pounds.   SLEEP ARCHITECTURE:  Short total sleep time 276 minutes. Sleep efficiency  67%. Stage I 4%, stage II 79%, stage III and IV 1%, REM 15% of total sleep  time. Sleep latency 5 minutes, REM latency 53 minutes, awake after sleep  onset 22 minutes, arousal index 13. No bedtime medication taken.   RESPIRATORY DATA:  Apnea/hypopnea index (AHI, RDI) is 2.6 obstructive events  per hour which is within normal limits. This included 1 obstructive apnea  and 11 hypopneas. Events were not positional. REM AHI 4.3. She did not  qualify for C-PAP titration by protocol.   OXYGEN DATA:  Moderate snoring with oxygen desaturation to a nadir of 90%.  Mean oxygen saturation through the study was 96% on room air.   CARDIAC DATA:  Normal sinus rhythm.   MOVEMENT/PARASOMNIA:  A total of 37 limb jerks were reported of which 19  were associated with arousal or awakening for a periodic limb movement with  arousal index of 4.1 per hour which is mildly increased.   IMPRESSION/RECOMMENDATIONS:  1.  Short total sleep time primarily because the patient woke around 3 a.m.      and was unable to return to sleep with no medication taken.  2.  Occasional sleep disordered breathing events, apnea/hypopnea index 2.6      per hour, which is within normal limits. This did not require specific      therapy. There was moderate snoring which may be addressed      conservatively as appropriate.  3.  Periodic  limb with arousal, 4.1 per hour.      Clinton D. Maple Hudson, M.D.  Diplomate, Biomedical engineer of Sleep Medicine  Electronically Signed     CDY/MEDQ  D:  01/08/2005 14:21:33  T:  01/09/2005 00:34:54  Job:  [id number]

## 2010-10-07 ENCOUNTER — Ambulatory Visit
Admission: RE | Admit: 2010-10-07 | Discharge: 2010-10-07 | Disposition: A | Discharge status: Home / Self Care | Payer: PRIVATE HEALTH INSURANCE | Source: Ambulatory Visit | Attending: Oncology | Admitting: Oncology

## 2010-10-07 DIAGNOSIS — Z9889 Other specified postprocedural states: Secondary | ICD-10-CM

## 2010-10-07 MED ORDER — GADOBENATE DIMEGLUMINE 529 MG/ML IV SOLN
18.0000 mL | Freq: Once | INTRAVENOUS | Status: AC | PRN
  Administered 2010-10-07: 18 mL via INTRAVENOUS

## 2010-10-20 ENCOUNTER — Encounter (INDEPENDENT_AMBULATORY_CARE_PROVIDER_SITE_OTHER): Payer: Self-pay | Admitting: General Surgery

## 2010-11-03 ENCOUNTER — Other Ambulatory Visit: Payer: Self-pay | Admitting: Oncology

## 2010-11-03 ENCOUNTER — Encounter (HOSPITAL_BASED_OUTPATIENT_CLINIC_OR_DEPARTMENT_OTHER): Payer: PRIVATE HEALTH INSURANCE | Admitting: Oncology

## 2010-11-03 DIAGNOSIS — I10 Essential (primary) hypertension: Secondary | ICD-10-CM

## 2010-11-03 DIAGNOSIS — N6089 Other benign mammary dysplasias of unspecified breast: Secondary | ICD-10-CM

## 2010-11-03 DIAGNOSIS — D573 Sickle-cell trait: Secondary | ICD-10-CM

## 2010-11-03 DIAGNOSIS — E119 Type 2 diabetes mellitus without complications: Secondary | ICD-10-CM

## 2010-11-03 LAB — CBC WITH DIFFERENTIAL/PLATELET
BASO%: 0.2 % (ref 0.0–2.0)
Basophils Absolute: 0 10*3/uL (ref 0.0–0.1)
EOS%: 3.7 % (ref 0.0–7.0)
Eosinophils Absolute: 0.2 10*3/uL (ref 0.0–0.5)
HCT: 34.2 % — ABNORMAL LOW (ref 34.8–46.6)
HGB: 11 g/dL — ABNORMAL LOW (ref 11.6–15.9)
LYMPH%: 45 % (ref 14.0–49.7)
MCH: 25.3 pg (ref 25.1–34.0)
MCHC: 32.2 g/dL (ref 31.5–36.0)
MCV: 78.6 fL — ABNORMAL LOW (ref 79.5–101.0)
MONO#: 0.3 10*3/uL (ref 0.1–0.9)
MONO%: 6.8 % (ref 0.0–14.0)
NEUT#: 1.8 10*3/uL (ref 1.5–6.5)
NEUT%: 44.3 % (ref 38.4–76.8)
Platelets: 263 10*3/uL (ref 145–400)
RBC: 4.35 10*6/uL (ref 3.70–5.45)
RDW: 15 % — ABNORMAL HIGH (ref 11.2–14.5)
WBC: 4.1 10*3/uL (ref 3.9–10.3)
lymph#: 1.8 10*3/uL (ref 0.9–3.3)
nRBC: 0 % (ref 0–0)

## 2010-11-03 LAB — COMPREHENSIVE METABOLIC PANEL
ALT: 14 U/L (ref 0–35)
AST: 17 U/L (ref 0–37)
Albumin: 3.9 g/dL (ref 3.5–5.2)
Alkaline Phosphatase: 50 U/L (ref 39–117)
BUN: 17 mg/dL (ref 6–23)
CO2: 26 mEq/L (ref 19–32)
Calcium: 9.4 mg/dL (ref 8.4–10.5)
Chloride: 105 mEq/L (ref 96–112)
Creatinine, Ser: 0.87 mg/dL (ref 0.50–1.10)
Glucose, Bld: 163 mg/dL — ABNORMAL HIGH (ref 70–99)
Potassium: 4.2 mEq/L (ref 3.5–5.3)
Sodium: 138 mEq/L (ref 135–145)
Total Bilirubin: 0.3 mg/dL (ref 0.3–1.2)
Total Protein: 6.8 g/dL (ref 6.0–8.3)

## 2010-11-03 LAB — IRON AND TIBC
%SAT: 19 % — ABNORMAL LOW (ref 20–55)
Iron: 64 ug/dL (ref 42–145)
TIBC: 331 ug/dL (ref 250–470)
UIBC: 267 ug/dL

## 2010-11-03 LAB — FERRITIN: Ferritin: 42 ng/mL (ref 10–291)

## 2010-12-14 ENCOUNTER — Ambulatory Visit (INDEPENDENT_AMBULATORY_CARE_PROVIDER_SITE_OTHER): Payer: PRIVATE HEALTH INSURANCE | Admitting: Family Medicine

## 2010-12-14 ENCOUNTER — Encounter: Payer: Self-pay | Admitting: Family Medicine

## 2010-12-14 VITALS — BP 122/70 | HR 80 | Temp 98.0°F | Ht 67.0 in | Wt 198.8 lb

## 2010-12-14 DIAGNOSIS — M25519 Pain in unspecified shoulder: Secondary | ICD-10-CM

## 2010-12-14 DIAGNOSIS — E1165 Type 2 diabetes mellitus with hyperglycemia: Secondary | ICD-10-CM

## 2010-12-14 DIAGNOSIS — E118 Type 2 diabetes mellitus with unspecified complications: Secondary | ICD-10-CM

## 2010-12-14 DIAGNOSIS — I10 Essential (primary) hypertension: Secondary | ICD-10-CM

## 2010-12-14 DIAGNOSIS — E785 Hyperlipidemia, unspecified: Secondary | ICD-10-CM

## 2010-12-14 DIAGNOSIS — M791 Myalgia, unspecified site: Secondary | ICD-10-CM

## 2010-12-14 DIAGNOSIS — IMO0001 Reserved for inherently not codable concepts without codable children: Secondary | ICD-10-CM

## 2010-12-14 DIAGNOSIS — M542 Cervicalgia: Secondary | ICD-10-CM

## 2010-12-14 DIAGNOSIS — E559 Vitamin D deficiency, unspecified: Secondary | ICD-10-CM

## 2010-12-14 DIAGNOSIS — N6089 Other benign mammary dysplasias of unspecified breast: Secondary | ICD-10-CM

## 2010-12-14 DIAGNOSIS — N6099 Unspecified benign mammary dysplasia of unspecified breast: Secondary | ICD-10-CM

## 2010-12-14 NOTE — Patient Instructions (Signed)
Increase Lantus from 30 to 32 units at night Ok to take Acetaminophen for muscle pain Follow-up in [facility] October. Bring sugar log or meter and any labs. Last a1c in April was 7.5%

## 2010-12-14 NOTE — Progress Notes (Signed)
  Subjective:    Patient ID: Kathy Duncan, female    DOB: 07/23/1955, 55 y.o.   MRN: [id number]  HPI  DIABETES  Taking and tolerating: yes Lantus 20 + metformin Fasting blood sugars:120-130's Hypoglycemic symptoms: no Visual problems: no Monitoring feet: yes Numbness/Tingling: no Last eye exam:within the last year Diabetic Labs: States she had an a1c drawn at work last month that was 7.7% Lab Results  Component Value Date   HGBA1C 7.5 09/02/2010   HGBA1C 7.3 04/28/2010   HGBA1C 7.6 04/14/2010   Lab Results  Component Value Date   LDLCALC 93 11/11/2009   CREATININE 0.87 11/03/2010   CREATININE 0.87 11/03/2010   Last microalbumin: No results found for this basename: MICROALBUR, [id number]   HYPERTENSION  BP Readings from Last 3 Encounters:  12/14/10 122/70  09/02/10 126/82  04/28/10 126/74    Hypertension ROS: taking medications as instructed, no medication side effects noted, home BP monitoring in range of 120's systolic over 70's diastolic, no TIA's, no chest pain on exertion, no dyspnea on exertion, no swelling of ankles and no intermittent claudication symptoms  HYPERLIPIDEMIA  Diet: She is doing a nutrition program with a zumba instructor and as lost some weight Exercise: Does zumba classes regularly Wt Readings from Last 3 Encounters:  12/14/10 198 lb 12.8 oz (90.175 kg)  09/02/10 202 lb 1.6 oz (91.672 kg)  04/28/10 206 lb (93.441 kg)   ROS:  Denies RUQ pain, myalgias, or symptoms or coronary ischemia Lab Results  Component Value Date   LDLCALC 93 11/11/2009   Lab Results  Component Value Date   CHOL 177 11/11/2009   CHOL 173 05/28/2008   CHOL 175 03/03/2008   Lab Results  Component Value Date   HDL 69 11/11/2009   HDL 71 01/27/7828   HDL 66 56/21/3086   Lab Results  Component Value Date   TRIG 73 11/11/2009   TRIG 53 05/28/2008   TRIG 84 03/03/2008   Lab Results  Component Value Date   ALT 14 11/03/2010   ALT 14 11/03/2010   AST 17 11/03/2010   AST 17 11/03/2010   ALKPHOS 50 11/03/2010   ALKPHOS 50 11/03/2010   BILITOT 0.3 11/03/2010   BILITOT 0.3 11/03/2010    Myalgias:  Continues to have aching muscles but tolerable.  Trial off statins did not help.      Review of Systems Gen:  No fever, chills, unexplained weight loss Ears:  No hearing loss, ringing Eyes: No vision changes, double vision, eye drainage Nose:  No rhinorrhea, congestion Throat:  No sore throat or dysphagia CV:  No chest pain, palpitations, PND, dyspnea on exertion, or edema Resp: No cough, dyspnea, wheezing Abd: No nausea, vomting, diarrhea, constipation, or change in bowel color, size, or caliber. MSK: no joint pain, myalgias GU: No dysuria, hematuria, vaginal discharge      Objective:   Physical Exam GEN: Alert & Oriented, No acute distress CV:  Regular Rate & Rhythm, no murmur Respiratory:  Normal work of breathing, CTAB Abd:  + BS, soft, no tenderness to palpation Ext: no pre-tibial edema        Assessment & Plan:

## 2010-12-15 ENCOUNTER — Encounter: Payer: Self-pay | Admitting: Family Medicine

## 2010-12-15 DIAGNOSIS — N6099 Unspecified benign mammary dysplasia of unspecified breast: Secondary | ICD-10-CM | POA: Insufficient documentation

## 2010-12-15 NOTE — Assessment & Plan Note (Signed)
Advised to restart stains since did not make any difference with myalgias.  Will get FLP at next visit.

## 2010-12-15 NOTE — Assessment & Plan Note (Signed)
Manageable with prn acetaminophen.  No new red flags.

## 2010-12-15 NOTE — Assessment & Plan Note (Signed)
Continue current treatment. 

## 2010-12-15 NOTE — Assessment & Plan Note (Signed)
a1c at workplace 7.7% per patient.  Has been gradually increasing.  Will increase lantus dose slowly from 30-32 given history of feeling bad with 70's-80's blood sugars.  She is continuing to lose weight.  encouraged continued lifestyle modification.

## 2011-01-04 ENCOUNTER — Encounter (INDEPENDENT_AMBULATORY_CARE_PROVIDER_SITE_OTHER): Payer: Self-pay | Admitting: General Surgery

## 2011-02-16 ENCOUNTER — Other Ambulatory Visit: Payer: Self-pay | Admitting: Family Medicine

## 2011-02-17 NOTE — Telephone Encounter (Signed)
Refill request

## 2011-03-13 ENCOUNTER — Ambulatory Visit: Payer: PRIVATE HEALTH INSURANCE | Admitting: Family Medicine

## 2011-03-14 ENCOUNTER — Encounter: Payer: Self-pay | Admitting: Family Medicine

## 2011-03-14 ENCOUNTER — Ambulatory Visit (INDEPENDENT_AMBULATORY_CARE_PROVIDER_SITE_OTHER): Payer: PRIVATE HEALTH INSURANCE | Admitting: Family Medicine

## 2011-03-14 VITALS — BP 147/84 | HR 76 | Ht 67.0 in | Wt 193.4 lb

## 2011-03-14 DIAGNOSIS — M542 Cervicalgia: Secondary | ICD-10-CM

## 2011-03-14 DIAGNOSIS — I10 Essential (primary) hypertension: Secondary | ICD-10-CM

## 2011-03-14 DIAGNOSIS — E1165 Type 2 diabetes mellitus with hyperglycemia: Secondary | ICD-10-CM

## 2011-03-14 DIAGNOSIS — Z23 Encounter for immunization: Secondary | ICD-10-CM

## 2011-03-14 DIAGNOSIS — E118 Type 2 diabetes mellitus with unspecified complications: Secondary | ICD-10-CM

## 2011-03-14 DIAGNOSIS — E785 Hyperlipidemia, unspecified: Secondary | ICD-10-CM

## 2011-03-14 LAB — POCT GLYCOSYLATED HEMOGLOBIN (HGB A1C): Hemoglobin A1C: 7

## 2011-03-14 NOTE — Assessment & Plan Note (Signed)
Patient continues to use Flexeril occasionally along with Tylenol for neck and shoulder pain and knee arthritis. We discussed that this is a fine medical regimen and that if it becomes limiting to her ADLs or enjoyable activities that we will address  this further

## 2011-03-14 NOTE — Assessment & Plan Note (Signed)
Patient is overdue for fasting lipids. She would prefer to get this done at her work as that is more cost effective. I advised her to bring results to her next office visit. He has been doing very well with lifestyle modification

## 2011-03-14 NOTE — Progress Notes (Signed)
  Subjective:    Patient ID: Kathy Duncan, female    DOB: [birth year], 55 y.o.   MRN: [id number]  HPI  DIABETES  Taking and tolerating: yes Lantus at night Fasting blood sugars:64-65 in the morning  Hypoglycemic symptoms: no-jittery in the morning Visual problems: no Monitoring feet: yes Numbness/Tingling: no Last eye exam:August 2012  Diabetic Labs:  Lab Results  Component Value Date   HGBA1C 7.0 03/14/2011   HGBA1C 7.5 09/02/2010   HGBA1C 7.3 04/28/2010   Lab Results  Component Value Date   LDLCALC 93 11/11/2009   CREATININE 0.87 11/03/2010   CREATININE 0.87 11/03/2010   Last microalbumin: No results found for this basename: MICROALBUR, MALB24HUR   HYPERTENSION  BP Readings from Last 3 Encounters:  03/14/11 147/84  12/14/10 122/70  09/02/10 126/82    Hypertension ROS: taking medications as instructed, no medication side effects noted, patient does not perform home BP monitoring, no chest pain on exertion, no dyspnea on exertion, no swelling of ankles and no intermittent claudication symptoms.    HYPERLIPIDEMIA  Diet: doing very well with her diet- has lost weight Exercise:Doing very well with regular exercise Wt Readings from Last 3 Encounters:  03/14/11 193 lb 6.4 oz (87.726 kg)  12/14/10 198 lb 12.8 oz (90.175 kg)  09/02/10 202 lb 1.6 oz (91.672 kg)   ROS:  Denies RUQ pain, myalgias, or symptoms or coronary ischemia Lab Results  Component Value Date   LDLCALC 93 11/11/2009   Lab Results  Component Value Date   CHOL 177 11/11/2009   CHOL 173 05/28/2008   CHOL 175 03/03/2008   Lab Results  Component Value Date   HDL 69 11/11/2009   HDL 71 08/21/8117   HDL 66 14/78/2956   Lab Results  Component Value Date   TRIG 73 11/11/2009   TRIG 53 05/28/2008   TRIG 84 03/03/2008   Lab Results  Component Value Date   ALT 14 11/03/2010   ALT 14 11/03/2010   AST 17 11/03/2010   AST 17 11/03/2010   ALKPHOS 50 11/03/2010   ALKPHOS 50 11/03/2010   BILITOT 0.3  11/03/2010   BILITOT 0.3 11/03/2010         Review of Systemssee above     Objective:   Physical ExamGEN: Alert & Oriented, No acute distress CV:  Regular Rate & Rhythm, no murmur Respiratory:  Normal work of breathing, CTAB Abd:  + BS, soft, no tenderness to palpation Ext: no pre-tibial edema Foot exam normal.        Assessment & Plan:

## 2011-03-14 NOTE — Assessment & Plan Note (Signed)
Slightly elevated today. Patient will keep a log to monitor her blood pressures. Has a history of very good control I suspect today as an outlier

## 2011-03-14 NOTE — Patient Instructions (Signed)
Your blood pressure goal is less than 130/80.  Keep a log of blood pressure and bring to your next appointment  You are due for your fasting cholesterol and labs.  Dr. Milta Deiters phone number at the cancer center [phone]  Follow-up in 3 months

## 2011-03-14 NOTE — Assessment & Plan Note (Signed)
A1c today is improved. Patient does note some symptomatic lows in the 60s fasting in the morning. I advised her to consider a small bedtime snack to help with this. No change in Lantus regimen

## 2011-04-01 ENCOUNTER — Telehealth: Payer: Self-pay | Admitting: Oncology

## 2011-04-01 NOTE — Telephone Encounter (Signed)
Mailed the pt her June 2013 appt calendar. °

## 2011-07-05 ENCOUNTER — Telehealth: Payer: Self-pay | Admitting: Oncology

## 2011-07-05 NOTE — Telephone Encounter (Signed)
lmonvm advising the pt of her cancelled appt on 07/06/2011 and has been r/s to 07/12/2011@2 :30pm

## 2011-07-06 ENCOUNTER — Ambulatory Visit: Payer: PRIVATE HEALTH INSURANCE | Admitting: Family

## 2011-07-06 ENCOUNTER — Other Ambulatory Visit: Payer: PRIVATE HEALTH INSURANCE | Admitting: Lab

## 2011-07-12 ENCOUNTER — Ambulatory Visit (HOSPITAL_BASED_OUTPATIENT_CLINIC_OR_DEPARTMENT_OTHER): Payer: PRIVATE HEALTH INSURANCE | Admitting: Family

## 2011-07-12 ENCOUNTER — Other Ambulatory Visit: Payer: PRIVATE HEALTH INSURANCE | Admitting: Lab

## 2011-07-12 ENCOUNTER — Encounter: Payer: Self-pay | Admitting: Family

## 2011-07-12 ENCOUNTER — Telehealth: Payer: Self-pay | Admitting: *Deleted

## 2011-07-12 VITALS — BP 119/75 | HR 97 | Temp 98.6°F | Ht 67.0 in | Wt 193.5 lb

## 2011-07-12 DIAGNOSIS — N6099 Unspecified benign mammary dysplasia of unspecified breast: Secondary | ICD-10-CM

## 2011-07-12 DIAGNOSIS — N6089 Other benign mammary dysplasias of unspecified breast: Secondary | ICD-10-CM

## 2011-07-12 LAB — CBC WITH DIFFERENTIAL/PLATELET
BASO%: 0.5 % (ref 0.0–2.0)
Basophils Absolute: 0 10*3/uL (ref 0.0–0.1)
EOS%: 5.5 % (ref 0.0–7.0)
Eosinophils Absolute: 0.2 10*3/uL (ref 0.0–0.5)
HCT: 32.9 % — ABNORMAL LOW (ref 34.8–46.6)
HGB: 10.8 g/dL — ABNORMAL LOW (ref 11.6–15.9)
LYMPH%: 42.7 % (ref 14.0–49.7)
MCH: 26.5 pg (ref 25.1–34.0)
MCHC: 32.8 g/dL (ref 31.5–36.0)
MCV: 81 fL (ref 79.5–101.0)
MONO#: 0.3 10*3/uL (ref 0.1–0.9)
MONO%: 6.8 % (ref 0.0–14.0)
NEUT#: 1.9 10*3/uL (ref 1.5–6.5)
NEUT%: 44.5 % (ref 38.4–76.8)
Platelets: 230 10*3/uL (ref 145–400)
RBC: 4.06 10*6/uL (ref 3.70–5.45)
RDW: 15.3 % — ABNORMAL HIGH (ref 11.2–14.5)
WBC: 4.2 10*3/uL (ref 3.9–10.3)
lymph#: 1.8 10*3/uL (ref 0.9–3.3)

## 2011-07-12 LAB — COMPREHENSIVE METABOLIC PANEL
ALT: 13 U/L (ref 0–35)
AST: 21 U/L (ref 0–37)
Albumin: 4.1 g/dL (ref 3.5–5.2)
Alkaline Phosphatase: 57 U/L (ref 39–117)
BUN: 17 mg/dL (ref 6–23)
CO2: 22 mEq/L (ref 19–32)
Calcium: 9.2 mg/dL (ref 8.4–10.5)
Chloride: 103 mEq/L (ref 96–112)
Creatinine, Ser: 0.8 mg/dL (ref 0.50–1.10)
Glucose, Bld: 144 mg/dL — ABNORMAL HIGH (ref 70–99)
Potassium: 4.4 mEq/L (ref 3.5–5.3)
Sodium: 137 mEq/L (ref 135–145)
Total Bilirubin: 0.2 mg/dL — ABNORMAL LOW (ref 0.3–1.2)
Total Protein: 6.8 g/dL (ref 6.0–8.3)

## 2011-07-12 MED ORDER — TAMOXIFEN CITRATE 20 MG PO TABS: 20.0000 mg | ORAL_TABLET | Freq: Every day | ORAL | Status: AC

## 2011-07-12 NOTE — Progress Notes (Signed)
[facility]  High Risk Clinic Follow-up Visit  Name: Kathy Duncan            Date: 07/12/2011 MRN: [id number]                DOB: 1955-11-29  CC: Chevis Pretty, MD  REASON FOR VISIT: History atypical ductal hyperplasia, high risk breast cancer  HISTORY OF PRESENT ILLNESS: Remains on Tamoxifen with no appreciable side effects. No hot flashes. No vaginal discharge or vaginal dryness. Has had hysterectomy with retained ovaries.   Has been dieting, losing weight. Does a Zumba class 3-4 times weekly.  No self-detected problems with the breasts. Performs self-breast exam. Bowel and bladder function are normal.   CURRENT BREAST MEDICATIONS: Tamoxifen 20 mg daily. Began 01/2010.   ALLERGIES: Ace inhibitors  CHANGE IN FAMILY HISTORY:  None.  HEALTH MAINTENANCE: Last mammogram: September 02, 2010. Last clinical breast exam: June 2012 Performs self breast exam: Yes Colonoscopy: 2006  REVIEW OF SYSTEMS:  General: Negative for fever, chills, night sweats,  loss of appetite or weight loss. Diabetes well controlled. HEENT: Negative for headaches, sore  throat, difficulty swallowing, blurred vision or problem with hearing or  sinus congestion. Respiratory: Negative for shortness of breath, cough  or dyspnea on exertion. Cardiovascular: Negative for chest pain,  palpitations or pedal edema. GI: Negative for nausea, vomiting,  diarrhea, constipation, change in bowel habits or blood in the stool.  No jaundice. GU: Negative for painful or frequent urination, change in  color of urine, or decreased urinary stream. Integumentary: Negative  for skin rashes or other suspicious skin lesions. Hematologic: Negative  for easy bruisability or bleeding. Musculoskeletal: Negative for  complaints of pain, arthralgias, arthritis or myalgias.  Neurological/psychiatric: Negative for numbness, focal weakness,  balance problems or coordination difficulties. No depression or mood swings.    Breast: No self detected abnormalities in the breast. No nipple discharge, masses or redness of the skin.   PHYSICAL EXAM: BP 119/75  Pulse 97  Temp(Src) 98.6 F (37 C) (Oral)  Ht 5\' 7"  (1.702 m)  Wt 193 lb 8 oz (87.771 kg)  BMI 30.31 kg/m2 GENERAL: Well developed, well nourished, in no acute distress.  EENT: No ocular or oral lesions. No stomatitis.  RESPIRATORY: Lungs are clear to auscultation bilaterally with normal respiratory movement and no accessory muscle use. CARDIAC: No murmur, rub or tachycardia. No upper or lower extremity edema.  GI: Abdomen is soft, no palpable hepatosplenomegaly. No fluid wave. No tenderness. Musculoskeletal: No kyphosis, no tenderness over the spine, ribs or hips. Lymph: No cervical, infraclavicular, axillary or inguinal adenopathy. Neuro: No focal neurological deficits. Psych: Alert and oriented X 3, appropriate mood and affect.  BREAST EXAM: In the supine position, with the right arm over the head, right nipple is everted. No periareolar edema or nipple discharge. No mass in any quadrant or subareolar region. Remote lumpectomy incision, 11 o'clock position. 6 o'clock position, ovoid scar, 11 mm. No redness of the skin. No right axillary adenopathy. With the left arm over the head, left nipple is everted. No periareolar edema or nipple discharge. No mass in any quadrant or subareolar region. No redness of the skin. No left axillary adenopathy.    ASSESSMENT: 56 y/o female with: 1. 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 No evidence of malignancy. 2. Mammo April 2012, will be due April 2013. She prefers to schedule herself.  3. On Tamoxifen since September, 2001 with good tolerance.  No appreciable side effects.   PLAN:  1. Remain on Tamoxifen 20 mg daily. A new prescription is written for 90 day supply with 3 refills. It is e-scribed.  2. Return to clinic in 9 months to the High Risk Clinic. No labs needed on  that visit.  3. Mammo in April, 2013.

## 2011-07-12 NOTE — Telephone Encounter (Signed)
gave patient appointment for high risk appointment for 9 mionths

## 2011-07-12 NOTE — Patient Instructions (Signed)
Remain on Tamoxifen. Return in 9 months. Call the office if you notice anything abnormal on your breast exam.  Get your mammogram in April 2013. Call and schedule this yourself (per your wishes).

## 2011-08-16 ENCOUNTER — Encounter: Payer: Self-pay | Admitting: Family Medicine

## 2011-08-16 ENCOUNTER — Ambulatory Visit (INDEPENDENT_AMBULATORY_CARE_PROVIDER_SITE_OTHER): Payer: PRIVATE HEALTH INSURANCE | Admitting: Family Medicine

## 2011-08-16 VITALS — BP 142/80 | HR 64 | Temp 97.9°F | Ht 67.0 in | Wt 191.0 lb

## 2011-08-16 DIAGNOSIS — E1165 Type 2 diabetes mellitus with hyperglycemia: Secondary | ICD-10-CM

## 2011-08-16 DIAGNOSIS — R51 Headache: Secondary | ICD-10-CM

## 2011-08-16 DIAGNOSIS — E118 Type 2 diabetes mellitus with unspecified complications: Secondary | ICD-10-CM

## 2011-08-16 LAB — POCT UA - MICROALBUMIN
Albumin/Creatinine Ratio, Urine, POC: ABNORMAL
Creatinine, POC: 50 mg/dL
Microalbumin Ur, POC: 10 mg/dL

## 2011-08-16 LAB — POCT GLYCOSYLATED HEMOGLOBIN (HGB A1C): Hemoglobin A1C: 6.7

## 2011-08-16 MED ORDER — IBUPROFEN 600 MG PO TABS: 600.0000 mg | ORAL_TABLET | Freq: Three times a day (TID) | ORAL | Status: AC | PRN

## 2011-08-16 MED ORDER — BENZONATATE 100 MG PO CAPS: 100.0000 mg | ORAL_CAPSULE | Freq: Two times a day (BID) | ORAL | Status: DC | PRN

## 2011-08-16 MED ORDER — FLUTICASONE PROPIONATE 50 MCG/ACT NA SUSP: 2.0000 | Freq: Every day | NASAL | Status: DC

## 2011-08-16 NOTE — Patient Instructions (Addendum)
It was nice to meet you today. Your hemoglobin A1c was 6.7.  Keep up the good work. For your headaches, I will send Motrin 600 mg to your pharmacy.  Take one tablet every 8 hours as needed for pain. For nasal congestion, use Flonase and spray in nostrils as directed.   For cough, I will send Tessalon perles to your pharmacy.  Take as directed. IF symptoms worsen or you develop fever (temp > 102), please return to clinic. Schedule follow up appointment with PCP in 2-3 weeks.  Sinus Headache A sinus headache is when your sinuses become clogged or swollen. Sinus headaches can range from mild to severe.  CAUSES A sinus headache can have different causes, such as:  Colds.   Sinus infections.   Allergies.  SYMPTOMS  Symptoms of a sinus headache may vary and can include:  Headache.   Pain or pressure in the face.   Congested or runny nose.   Fever.   Inability to smell.   Pain in upper teeth. Weather changes can make symptoms worse. TREATMENT  The treatment of a sinus headache depends on the cause.  Sinus pain caused by a sinus infection may be treated with antibiotic medicine.   Sinus pain caused by allergies may be helped by allergy medicines (antihistamines) and medicated nasal sprays.   Sinus pain caused by congestion may be helped by flushing the nose and sinuses with saline solution.  HOME CARE INSTRUCTIONS   If antibiotics are prescribed, take them as directed. Finish them even if you start to feel better.   Only take over-the-counter or prescription medicines for pain, discomfort, or fever as directed by your caregiver.   If you have congestion, use a nasal spray to help reduce pressure.  SEEK IMMEDIATE MEDICAL CARE IF:  You have a fever.   You have headaches more than once a week.   You have sensitivity to light or sound.   You have repeated nausea and vomiting.   You have vision problems.   You have sudden, severe pain in your face or head.   You have  a seizure.   You are confused.   Your sinus headaches do not get better after treatment. Many people think they have a sinus headache when they actually have migraines or tension headaches.  MAKE SURE YOU:   Understand these instructions.   Will watch your condition.   Will get help right away if you are not doing well or get worse.  Document Released: 06/08/2004 Document Revised: 04/20/2011 Document Reviewed: 07/30/2010 [facility] Patient Information 2012 [place], [place].

## 2011-08-17 DIAGNOSIS — R519 Headache, unspecified: Secondary | ICD-10-CM | POA: Insufficient documentation

## 2011-08-17 DIAGNOSIS — R51 Headache: Secondary | ICD-10-CM | POA: Insufficient documentation

## 2011-08-17 NOTE — Assessment & Plan Note (Signed)
Likely secondary to sinus headache vs. URI since HA is associated with congestion, cough, sore throat.  May also be allergic rhinitis. Conservative management for now - Flonase, Tessalon, and Motrin PRN for pain. Red flags reviewed for prompt return to clinic. Follow up with PCP in 2-3 weeks or PRN.

## 2011-08-17 NOTE — Progress Notes (Signed)
  Subjective:     Kathy Duncan is a 56 y.o. female who presents for evaluation of sinus pain. Symptoms include: clear rhinorrhea, congestion, cough, headaches, nasal congestion, post nasal drip, sneezing and sore throat. Onset of symptoms was 3 weeks ago. Symptoms have been unchanged since that time.  She feels nauseated and vomited one time 3 weeks ago.  Headache is located at frontal lobe and does not radiate.  Usually occurs daily in the afternoon.  She has tried Excedrin PM at bedtime for headache which seems to help, but headache returns the next day.  Denies any changes in vision, photophobia, numbness/tingling of extremities.  Patient complains of productive cough with clear phlegm.  Denies any fever, but does have chills.  Denies any CP, SOB, or changes in appetite.  Past history is significant for no history of pneumonia or bronchitis. Patient is a non-smoker.    Review of Systems Pertinent items are noted in HPI.   Objective:     General appearance: alert, cooperative and no distress Head: Normocephalic, atraumatic, mild TTP of forehead, no pain on palpation of sinuses bilaterally Eyes: conjunctivae/corneas clear. PERRL, EOM's intact. Fundi benign. Nose: Nares normal.  Mucosa normal. No drainage or sinus tenderness. Throat: lips, mucosa, and tongue normal; oropharynx moist and clear without exudate Lungs: clear to auscultation bilaterally Heart: regular rate and rhythm, S1, S2 normal, no murmur, click, rub or gallop Skin: no rashes or bruises Neuro: no focal deficits, alert and oriented, motor and sensory intact  Assessment:    Acute viral sinusitis.    Plan:    Flonase for nasal congestion, Tessalon perles for cough, Motrin 600 as needed for headache (patient does not have hx of gastritis).  If symptoms persist, may consider adding anti-histamine.  Follow up with PCP as needed.  Red flags reviewed - see AVS

## 2011-09-04 ENCOUNTER — Other Ambulatory Visit: Payer: Self-pay | Admitting: Family Medicine

## 2011-09-12 ENCOUNTER — Ambulatory Visit (INDEPENDENT_AMBULATORY_CARE_PROVIDER_SITE_OTHER): Payer: PRIVATE HEALTH INSURANCE | Admitting: Family Medicine

## 2011-09-12 ENCOUNTER — Encounter: Payer: Self-pay | Admitting: Family Medicine

## 2011-09-12 VITALS — BP 122/77 | HR 81 | Temp 98.1°F | Ht 67.0 in | Wt 191.6 lb

## 2011-09-12 DIAGNOSIS — E785 Hyperlipidemia, unspecified: Secondary | ICD-10-CM

## 2011-09-12 DIAGNOSIS — D509 Iron deficiency anemia, unspecified: Secondary | ICD-10-CM

## 2011-09-12 DIAGNOSIS — E1165 Type 2 diabetes mellitus with hyperglycemia: Secondary | ICD-10-CM

## 2011-09-12 DIAGNOSIS — G5600 Carpal tunnel syndrome, unspecified upper limb: Secondary | ICD-10-CM

## 2011-09-12 DIAGNOSIS — Z23 Encounter for immunization: Secondary | ICD-10-CM

## 2011-09-12 DIAGNOSIS — I10 Essential (primary) hypertension: Secondary | ICD-10-CM

## 2011-09-12 DIAGNOSIS — E118 Type 2 diabetes mellitus with unspecified complications: Secondary | ICD-10-CM

## 2011-09-12 DIAGNOSIS — D649 Anemia, unspecified: Secondary | ICD-10-CM

## 2011-09-12 DIAGNOSIS — E559 Vitamin D deficiency, unspecified: Secondary | ICD-10-CM

## 2011-09-12 LAB — LIPID PANEL
Cholesterol: 162 mg/dL (ref 0–200)
HDL: 45 mg/dL (ref >39)
LDL Cholesterol: 98 mg/dL (ref 0–99)
Total CHOL/HDL Ratio: 3.6 Ratio
Triglycerides: 94 mg/dL (ref <150)
VLDL: 19 mg/dL (ref 0–40)

## 2011-09-12 LAB — ANEMIA PANEL
%SAT: 22 % (ref 20–55)
ABS Retic: 50.2 10*3/uL (ref 19.0–186.0)
Ferritin: 34 ng/mL (ref 10–291)
Folate: >20 ng/mL
Iron: 73 ug/dL (ref 42–145)
RBC.: 4.18 MIL/uL (ref 3.87–5.11)
Retic Ct Pct: 1.2 % (ref 0.4–2.3)
TIBC: 336 ug/dL (ref 250–470)
UIBC: 263 ug/dL (ref 125–400)
Vitamin B-12: 364 pg/mL (ref 211–911)

## 2011-09-12 MED ORDER — FERROUS SULFATE 325 (65 FE) MG PO TABS: 325.0000 mg | ORAL_TABLET | Freq: Every day | ORAL | Status: AC

## 2011-09-12 MED ORDER — NYSTATIN 100000 UNIT/GM EX POWD: Freq: Two times a day (BID) | CUTANEOUS | Status: AC

## 2011-09-12 NOTE — Assessment & Plan Note (Signed)
Well controlled- when flares she wears night braces and it resolved.

## 2011-09-12 NOTE — Assessment & Plan Note (Signed)
Will check flp

## 2011-09-12 NOTE — Assessment & Plan Note (Signed)
Chronic, last normal was 12.3 in 2010 then dipped below 12 in fall of 2010.  Normal colonoscopy 5 years ago per patient. Will check labs to see if iron def still a component ( taking daily irons supplement)  May be chronic disease (on tamoxifen).  She is asymptmoatic

## 2011-09-12 NOTE — Assessment & Plan Note (Signed)
Well controlled today.

## 2011-09-12 NOTE — Progress Notes (Signed)
  Subjective:    Patient ID: Kathy Duncan, female    DOB: January 08, 1956, 56 y.o.   MRN: [id number]  HPIFollow-up  DIABETES  Taking and tolerating: yes Fasting blood sugars:80-100  Hypoglycemic symptoms: no Visual problems: no Monitoring feet: yes Numbness/Tingling: no Last eye exam:August 2012 Diabetic Labs:  Lab Results  Component Value Date   HGBA1C 6.7 08/16/2011   HGBA1C 7.0 03/14/2011   HGBA1C 7.5 09/02/2010   Lab Results  Component Value Date   LDLCALC 93 11/11/2009   CREATININE 0.80 07/12/2011   Last microalbumin: No results found for this basename: MICROALBUR, MALB24HUR   HYPERLIPIDEMIA  Diet: Not following low cholesterol diet Exercise:Does regular zumba Wt Readings from Last 3 Encounters:  09/12/11 191 lb 9.6 oz (86.909 kg)  08/16/11 191 lb (86.637 kg)  07/12/11 193 lb 8 oz (87.771 kg)   ROS:  Denies RUQ pain, myalgias, or symptoms or coronary ischemia Lab Results  Component Value Date   LDLCALC 93 11/11/2009   Lab Results  Component Value Date   CHOL 177 11/11/2009   CHOL 173 05/28/2008   CHOL 175 03/03/2008   Lab Results  Component Value Date   HDL 69 11/11/2009   HDL 71 11/02/3084   HDL 66 57/84/6962   Lab Results  Component Value Date   TRIG 73 11/11/2009   TRIG 53 05/28/2008   TRIG 84 03/03/2008   Lab Results  Component Value Date   ALT 13 07/12/2011   AST 21 07/12/2011   ALKPHOS 57 07/12/2011   BILITOT 0.2* 07/12/2011   RasH:  When hot weather, gets a rash under her breasts and in her axilla.  Itchy.  No new soaps, products, meds  Great toes- has been getting a pedicure- medial edge of toes is sore, concerned for ingrown nails.  Review of Systems     Objective:   Physical Exam GEN: Alert & Oriented, No acute distress CV:  Regular Rate & Rhythm, no murmur Respiratory:  Normal work of breathing, CTAB Abd:  + BS, soft, no tenderness to palpation Ext: no pre-tibial edema Bilateral great toes- painted nails.  Some irritation in medial  nail fold, no evidence of ingrown nail or cellulitis.  Thickened ridge of cuticle       Assessment & Plan:

## 2011-09-12 NOTE — Assessment & Plan Note (Signed)
Improved!  7.0   Occ has glucose below 70.  Eats dinner at 5 pm.  Advised taking a small bedtime snack.  May benefit from mealtime coverage in the future.  No change to Lantus today.

## 2011-09-12 NOTE — Assessment & Plan Note (Signed)
Will check today

## 2011-09-12 NOTE — Patient Instructions (Addendum)
Use powder for the areas on your skin- the most important thing is to keep it dry.  You got your tetanus shot with pertussis tdoay  Follow-up in 3-4 months  Will check your cholesterol today  Take an iron tablet daily  Have a great summer!

## 2011-09-13 LAB — VITAMIN D 25 HYDROXY (VIT D DEFICIENCY, FRACTURES): Vit D, 25-Hydroxy: 31 ng/mL (ref 30–89)

## 2011-09-14 ENCOUNTER — Encounter: Payer: Self-pay | Admitting: Family Medicine

## 2011-09-25 ENCOUNTER — Other Ambulatory Visit: Payer: Self-pay | Admitting: Family Medicine

## 2011-09-25 DIAGNOSIS — Z1231 Encounter for screening mammogram for malignant neoplasm of breast: Secondary | ICD-10-CM

## 2011-10-20 ENCOUNTER — Ambulatory Visit (HOSPITAL_COMMUNITY)
Admission: RE | Admit: 2011-10-20 | Discharge: 2011-10-20 | Disposition: A | Discharge status: Home / Self Care | Payer: PRIVATE HEALTH INSURANCE | Source: Ambulatory Visit | Attending: Family Medicine | Admitting: Family Medicine

## 2011-10-20 DIAGNOSIS — Z1231 Encounter for screening mammogram for malignant neoplasm of breast: Secondary | ICD-10-CM | POA: Insufficient documentation

## 2011-11-30 ENCOUNTER — Other Ambulatory Visit: Payer: Self-pay | Admitting: Oncology

## 2011-12-12 ENCOUNTER — Ambulatory Visit (INDEPENDENT_AMBULATORY_CARE_PROVIDER_SITE_OTHER): Payer: PRIVATE HEALTH INSURANCE | Admitting: Family Medicine

## 2011-12-12 ENCOUNTER — Encounter: Payer: Self-pay | Admitting: Family Medicine

## 2011-12-12 VITALS — BP 129/82 | HR 67 | Temp 98.5°F | Wt 196.0 lb

## 2011-12-12 DIAGNOSIS — D649 Anemia, unspecified: Secondary | ICD-10-CM

## 2011-12-12 DIAGNOSIS — E559 Vitamin D deficiency, unspecified: Secondary | ICD-10-CM

## 2011-12-12 DIAGNOSIS — E1165 Type 2 diabetes mellitus with hyperglycemia: Secondary | ICD-10-CM

## 2011-12-12 DIAGNOSIS — E785 Hyperlipidemia, unspecified: Secondary | ICD-10-CM

## 2011-12-12 DIAGNOSIS — E118 Type 2 diabetes mellitus with unspecified complications: Secondary | ICD-10-CM

## 2011-12-12 DIAGNOSIS — I10 Essential (primary) hypertension: Secondary | ICD-10-CM

## 2011-12-12 LAB — POCT GLYCOSYLATED HEMOGLOBIN (HGB A1C): Hemoglobin A1C: 7.2

## 2011-12-12 NOTE — Progress Notes (Signed)
  Subjective:    Patient ID: Kathy Duncan, female    DOB: [birth year], 56 y.o.   MRN: [id number]  HPI 56 yo here for routine follow-up  DIABETES  Taking and tolerating: yes Lantus 30 qpm  Fasting blood sugars:yes, low 100;'s  Hypoglycemic symptoms: did not a reading of 60 awakening with numb and tingling hands Visual problems: no Monitoring feet: yes Numbness/Tingling: no Last eye exam: has one scheduled Diabetic Labs:  Lab Results  Component Value Date   HGBA1C 7.2 12/12/2011   HGBA1C 6.7 08/16/2011   HGBA1C 7.0 03/14/2011   Lab Results  Component Value Date   LDLCALC 98 09/12/2011   CREATININE 0.80 07/12/2011   Last microalbumin: No results found for this basename: MICROALBUR, MALB24HUR    HYPERTENSION  BP Readings from Last 3 Encounters:  12/12/11 129/82  09/12/11 122/77  08/16/11 142/80    Hypertension ROS: taking medications as instructed, no medication side effects noted, no chest pain on exertion and no dyspnea on exertion.    HYPERLIPIDEMIA  Diet: Following low cholesterol diet Exercise:  regular exercise- zumba Wt Readings from Last 3 Encounters:  12/12/11 196 lb (88.905 kg)  09/12/11 191 lb 9.6 oz (86.909 kg)  08/16/11 191 lb (86.637 kg)   ROS:  Denies RUQ pain, myalgias, or symptoms or coronary ischemia Lab Results  Component Value Date   LDLCALC 98 09/12/2011   Lab Results  Component Value Date   CHOL 162 09/12/2011   CHOL 177 11/11/2009   CHOL 173 05/28/2008   Lab Results  Component Value Date   HDL 45 09/12/2011   HDL 69 08/21/8117   HDL 71 1/47/8295   Lab Results  Component Value Date   TRIG 94 09/12/2011   TRIG 73 11/11/2009   TRIG 53 05/28/2008   Lab Results  Component Value Date   ALT 13 07/12/2011   AST 21 07/12/2011   ALKPHOS 57 07/12/2011   BILITOT 0.2* 07/12/2011        Review of Systems    see HPI Objective:   Physical Exam  GEN: Alert & Oriented, No acute distress CV:  Regular Rate & Rhythm, no murmur Respiratory:   Normal work of breathing, CTAB Abd:  + BS, soft, no tenderness to palpation Ext: no pre-tibial edema Foot: normal monofilament. Skin without lesions.,  Nails well groomed.      Assessment & Plan:

## 2011-12-12 NOTE — Assessment & Plan Note (Signed)
Patient to increase  dose to 2000 mg daily.

## 2011-12-12 NOTE — Assessment & Plan Note (Signed)
Brings in labs with LDl of 83.  No changes.  Will scan to chart

## 2011-12-12 NOTE — Patient Instructions (Addendum)
If you notice blood sugars below 70, let me know so we can change your insulin  Change your Lantus to morning instead of night  Follow-up in 4 months

## 2011-12-12 NOTE — Assessment & Plan Note (Signed)
Slight elevation above goal.  With some Am hypoglycemia.  Will change lantus to Am administration to see if this helks.  Will follow-up in 4 months.  Will see her sooner if she continues to have lows.  May need to decrease lantus and add novolog tid with meals.

## 2011-12-12 NOTE — Assessment & Plan Note (Signed)
Anemia stable, mild leukopenia.  Probable tamoxifen.  Will continue to monitor- follow-up cbd with diff at next visit.

## 2011-12-12 NOTE — Assessment & Plan Note (Signed)
Well controleld today no changes

## 2012-01-28 ENCOUNTER — Other Ambulatory Visit: Payer: Self-pay | Admitting: Family Medicine

## 2012-02-09 ENCOUNTER — Other Ambulatory Visit: Payer: Self-pay | Admitting: Family Medicine

## 2012-04-09 ENCOUNTER — Ambulatory Visit: Payer: PRIVATE HEALTH INSURANCE | Admitting: Adult Health

## 2012-04-10 ENCOUNTER — Telehealth: Payer: Self-pay | Admitting: Oncology

## 2012-04-10 ENCOUNTER — Ambulatory Visit: Payer: PRIVATE HEALTH INSURANCE | Admitting: Family

## 2012-04-10 NOTE — Telephone Encounter (Signed)
gve the pt her dec 2013 appt calendar °

## 2012-04-23 ENCOUNTER — Encounter: Payer: Self-pay | Admitting: Family Medicine

## 2012-04-23 LAB — BASIC METABOLIC PANEL
BUN: 15 mg/dL (ref 4–21)
Creatinine: 0.8 mg/dL (ref 0.5–1.1)
Glucose: 172 mg/dL
Potassium: 4.3 mmol/L (ref 3.4–5.3)
Sodium: 139 mmol/L (ref 137–147)

## 2012-04-23 LAB — HEPATIC FUNCTION PANEL
ALT: 9 U/L (ref 7–35)
AST: 16 U/L (ref 13–35)
Alkaline Phosphatase: 63 U/L (ref 25–125)
Bilirubin, Total: 0.2 mg/dL

## 2012-04-23 LAB — LIPID PANEL
Cholesterol: 165 mg/dL (ref 0–200)
HDL: 69 mg/dL (ref 35–70)
LDL Cholesterol: 81 mg/dL
Triglycerides: 75 mg/dL (ref 40–160)

## 2012-04-23 LAB — CBC AND DIFFERENTIAL
HCT: 36 % (ref 36–46)
Hemoglobin: 11.8 g/dL — AB (ref 12.0–16.0)
Platelets: 308 10*3/uL (ref 150–399)
WBC: 3.9 10^3/mL

## 2012-04-23 LAB — HEMOGLOBIN A1C: Hgb A1c MFr Bld: 8.8 % — AB (ref 4.0–6.0)

## 2012-04-30 ENCOUNTER — Ambulatory Visit (INDEPENDENT_AMBULATORY_CARE_PROVIDER_SITE_OTHER): Payer: PRIVATE HEALTH INSURANCE | Admitting: Family Medicine

## 2012-04-30 ENCOUNTER — Ambulatory Visit (HOSPITAL_BASED_OUTPATIENT_CLINIC_OR_DEPARTMENT_OTHER): Payer: PRIVATE HEALTH INSURANCE | Admitting: Adult Health

## 2012-04-30 ENCOUNTER — Encounter: Payer: Self-pay | Admitting: Family Medicine

## 2012-04-30 ENCOUNTER — Telehealth: Payer: Self-pay | Admitting: *Deleted

## 2012-04-30 ENCOUNTER — Encounter: Payer: Self-pay | Admitting: Adult Health

## 2012-04-30 VITALS — BP 129/79 | HR 78 | Ht 67.0 in | Wt 195.0 lb

## 2012-04-30 VITALS — BP 137/83 | HR 85 | Temp 98.5°F | Resp 20 | Ht 67.0 in | Wt 195.9 lb

## 2012-04-30 DIAGNOSIS — I10 Essential (primary) hypertension: Secondary | ICD-10-CM

## 2012-04-30 DIAGNOSIS — E1165 Type 2 diabetes mellitus with hyperglycemia: Secondary | ICD-10-CM

## 2012-04-30 DIAGNOSIS — E118 Type 2 diabetes mellitus with unspecified complications: Secondary | ICD-10-CM

## 2012-04-30 DIAGNOSIS — Z23 Encounter for immunization: Secondary | ICD-10-CM

## 2012-04-30 DIAGNOSIS — E559 Vitamin D deficiency, unspecified: Secondary | ICD-10-CM

## 2012-04-30 DIAGNOSIS — E785 Hyperlipidemia, unspecified: Secondary | ICD-10-CM

## 2012-04-30 DIAGNOSIS — N6089 Other benign mammary dysplasias of unspecified breast: Secondary | ICD-10-CM

## 2012-04-30 DIAGNOSIS — N6099 Unspecified benign mammary dysplasia of unspecified breast: Secondary | ICD-10-CM

## 2012-04-30 LAB — VITAMIN D 25 HYDROXY (VIT D DEFICIENCY, FRACTURES): Vit D, 25-Hydroxy: 11.2

## 2012-04-30 MED ORDER — ERGOCALCIFEROL 1.25 MG (50000 UT) PO CAPS: ORAL_CAPSULE | ORAL | Status: DC

## 2012-04-30 MED ORDER — METFORMIN HCL 1000 MG PO TABS: 1000.0000 mg | ORAL_TABLET | Freq: Two times a day (BID) | ORAL | Status: DC

## 2012-04-30 NOTE — Assessment & Plan Note (Signed)
Dietary indiscretion and cessation of previously good exercise regimen likely cause of a1c 8.8 up from 7.2%.  Will increase lantus from 30 to 32 and further to 34 to keep fasting glucoses between 80 and 120.  Pneumovax given  Today.    Follow-up in 3 months

## 2012-04-30 NOTE — Assessment & Plan Note (Signed)
Well controlled today, continue current meds

## 2012-04-30 NOTE — Progress Notes (Signed)
[facility]  High Risk Clinic Follow-up Visit  Name: Kathy Duncan            Date: 04/30/2012 MRN: [id number]                DOB: 1955/06/28  CC: Chevis Pretty, MD  REASON FOR VISIT: History atypical ductal hyperplasia, high risk breast cancer  HISTORY OF PRESENT ILLNESS: Remains on Tamoxifen with no appreciable side effects. No hot flashes. No vaginal discharge or vaginal dryness. Has had hysterectomy with retained ovaries.   Has been dieting, losing weight. Does a Zumba class 3-4 times weekly.  No self-detected problems with the breasts. Performs self-breast exam. Bowel and bladder function are normal.   CURRENT BREAST MEDICATIONS: Tamoxifen 20 mg daily. Began 01/2010.   ALLERGIES: Ace inhibitors  CHANGE IN FAMILY HISTORY:  None.  Health Maintenance  Mammogram: 10/2011 Colonoscopy:2006 Bone Density Scan: n/a Pap Smear: s/p TAH/BSO Eye Exam: 12/2011 Vitamin D Level: n/a Lipid Panel: 11/2011   REVIEW OF SYSTEMS:  General: fatigue (-), night sweats (-), fever (-), pain (-) Lymph: palpable nodes (-) HEENT: vision changes (-), mucositis (-), gum bleeding (-), epistaxis (-) Cardiovascular: chest pain (-), palpitations (-) Pulmonary: shortness of breath (-), dyspnea on exertion (-), cough (-), hemoptysis (-) GI:  Early satiety (-), melena (-), dysphagia (-), nausea/vomiting (-), diarrhea (-) GU: dysuria (-), hematuria (-), incontinence (-) Musculoskeletal: joint swelling (-), joint pain (-), back pain (-) Neuro: weakness (-), numbness (-), headache (-), confusion (-) Skin: Rash (-), lesions (-), dryness (-) Psych: depression (-), suicidal/homicidal ideation (-), feeling of hopelessness (-)  PHYSICAL EXAM: BP 137/83  Pulse 85  Temp 98.5 F (36.9 C) (Oral)  Resp 20  Ht 5\' 7"  (1.702 m)  Wt 195 lb 14.4 oz (88.86 kg)  BMI 30.68 kg/m2 GENERAL: Well developed, well nourished, in no acute distress.  EENT: No ocular or oral lesions. No stomatitis.   RESPIRATORY: Lungs are clear to auscultation bilaterally with normal respiratory movement and no accessory muscle use. CARDIAC: No murmur, rub or tachycardia. No upper or lower extremity edema.  GI: Abdomen is soft, no palpable hepatosplenomegaly. No fluid wave. No tenderness. Musculoskeletal: No kyphosis, no tenderness over the spine, ribs or hips. Lymph: No cervical, infraclavicular, axillary or inguinal adenopathy. Neuro: No focal neurological deficits. Psych: Alert and oriented X 3, appropriate mood and affect.  BREAST EXAM: In the supine position, with the right arm over the head, right nipple is everted. No periareolar edema or nipple discharge. No mass in any quadrant or subareolar region. Remote lumpectomy incision, 11 o'clock position. 6 o'clock position, ovoid scar, 11 mm. No redness of the skin. No right axillary adenopathy. With the left arm over the head, left nipple is everted. No periareolar edema or nipple discharge. No mass in any quadrant or subareolar region. No redness of the skin. No left axillary adenopathy.    ASSESSMENT: 56 y/o female with: 1. 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 No evidence of malignancy. 2. Mammo last in June 2013.   3. On Tamoxifen since September, 2001 with good tolerance. No appreciable side effects.   PLAN:  1. Remain on Tamoxifen 20 mg daily. 2. Return to clinic in one year.   3. Mammo due in June 2014.

## 2012-04-30 NOTE — Assessment & Plan Note (Signed)
Persistently low, drifts down soon after repletion.  Will rx d2 50000 units q weekly x 8 weeks, then monthly thereafter.  Recheck in 3-6 months

## 2012-04-30 NOTE — Patient Instructions (Addendum)
Goal fasting blood sugar between 80 and 120 before you eat.  Increase lantus to 32 units.  If you notice sugar is still higher than 120 several days in a row, then ok to increase to 134.  Try greek yogurt for protein for breakfast or decreasing sugar snacks.  Vitamin D- take once a week for 8 weeks, then will switch to once monthly  Follow-up in 3 months

## 2012-04-30 NOTE — Progress Notes (Signed)
  Subjective:    Patient ID: Kathy Duncan, female    DOB: [birth year], 56 y.o.   MRN: [id number]  HPI  DIABETES  Has had a lot of lifestyle changes since last seen.  Has not done her zumba regularly.  Also started taking a daily protein shake and notes her diet is worse.  Taking and tolerating: yes- 1000 bid + lantus 30 units qam.   Fasting blood sugars: 120-130's  Hypoglycemic symptoms: no- much better now taking lantus in am Visual problems: no Monitoring feet: yes Numbness/Tingling: no Last eye exam: up to date Diabetic Labs:  Lab Results  Component Value Date   HGBA1C 8.8* 04/23/2012   HGBA1C 7.2 12/12/2011   HGBA1C 6.7 08/16/2011   Lab Results  Component Value Date   LDLCALC 81 04/23/2012   CREATININE 0.8 04/23/2012   Last microalbumin: No results found for this basename: MICROALBUR, MALB24HUR    HYPERTENSION  BP Readings from Last 3 Encounters:  04/30/12 129/79  04/30/12 137/83  12/12/11 129/82    Hypertension ROS: taking medications as instructed, no medication side effects noted, no TIA's, no chest pain on exertion, no dyspnea on exertion and no swelling of ankles.   HYPERLIPIDEMIA  Diet: Not following low cholesterol diet Exercise: No regular exercise Wt Readings from Last 3 Encounters:  04/30/12 195 lb (88.451 kg)  04/30/12 195 lb 14.4 oz (88.86 kg)  12/12/11 196 lb (88.905 kg)   ROS:  Denies RUQ pain, myalgias, or symptoms or coronary ischemia Lab Results  Component Value Date   LDLCALC 81 04/23/2012   Lab Results  Component Value Date   CHOL 165 04/23/2012   CHOL 162 09/12/2011   CHOL 177 11/11/2009   Lab Results  Component Value Date   HDL 69 04/23/2012   HDL 45 09/12/2011   HDL 69 08/21/8117   Lab Results  Component Value Date   TRIG 75 04/23/2012   TRIG 94 09/12/2011   TRIG 73 11/11/2009   Lab Results  Component Value Date   ALT 9 04/23/2012   AST 16 04/23/2012   ALKPHOS 63 04/23/2012   BILITOT 0.2* 07/12/2011         Review of Systems see HPI   Objective:   Physical Exam GEN: Alert & Oriented, No acute distress CV:  Regular Rate & Rhythm, no murmur Respiratory:  Normal work of breathing, CTAB Abd:  + BS, soft, no tenderness to palpation   Reviewed workplace labs with patient      Assessment & Plan:

## 2012-04-30 NOTE — Assessment & Plan Note (Signed)
Reviewed lipids, hdl increase, ldld stable- doing very well on lovastatin

## 2012-04-30 NOTE — Telephone Encounter (Signed)
Gave patient appointment for 04-2013  

## 2012-04-30 NOTE — Patient Instructions (Addendum)
Doing well.  Cardiovascular exercise is recommended 30 minutes per day most days of the week.  Your next mammogram is due in June 2013.  We will see you back in one year.  Continue Tamoxifen.

## 2012-08-27 ENCOUNTER — Ambulatory Visit: Payer: PRIVATE HEALTH INSURANCE | Admitting: Family Medicine

## 2012-09-07 ENCOUNTER — Other Ambulatory Visit: Payer: Self-pay | Admitting: Family Medicine

## 2012-09-30 ENCOUNTER — Ambulatory Visit (INDEPENDENT_AMBULATORY_CARE_PROVIDER_SITE_OTHER): Payer: PRIVATE HEALTH INSURANCE | Admitting: Family Medicine

## 2012-09-30 ENCOUNTER — Encounter: Payer: Self-pay | Admitting: Family Medicine

## 2012-09-30 VITALS — BP 132/76 | HR 94 | Temp 98.6°F | Ht 67.0 in | Wt 200.0 lb

## 2012-09-30 DIAGNOSIS — E1149 Type 2 diabetes mellitus with other diabetic neurological complication: Secondary | ICD-10-CM

## 2012-09-30 DIAGNOSIS — E118 Type 2 diabetes mellitus with unspecified complications: Secondary | ICD-10-CM

## 2012-09-30 DIAGNOSIS — E559 Vitamin D deficiency, unspecified: Secondary | ICD-10-CM

## 2012-09-30 DIAGNOSIS — E1140 Type 2 diabetes mellitus with diabetic neuropathy, unspecified: Secondary | ICD-10-CM

## 2012-09-30 DIAGNOSIS — E1142 Type 2 diabetes mellitus with diabetic polyneuropathy: Secondary | ICD-10-CM

## 2012-09-30 DIAGNOSIS — E1165 Type 2 diabetes mellitus with hyperglycemia: Secondary | ICD-10-CM

## 2012-09-30 DIAGNOSIS — I10 Essential (primary) hypertension: Secondary | ICD-10-CM

## 2012-09-30 MED ORDER — GABAPENTIN 300 MG PO CAPS: 300.0000 mg | ORAL_CAPSULE | Freq: Three times a day (TID) | ORAL | Status: DC

## 2012-09-30 MED ORDER — INSULIN GLARGINE 100 UNIT/ML ~~LOC~~ SOLN: SUBCUTANEOUS | Status: DC

## 2012-09-30 MED ORDER — ERGOCALCIFEROL 1.25 MG (50000 UT) PO CAPS: ORAL_CAPSULE | ORAL | Status: DC

## 2012-09-30 NOTE — Progress Notes (Signed)
  Subjective:    Patient ID: Kathy Duncan, female    DOB: Jul 28, 1955, 57 y.o.   MRN: [id number]  HPIHere for routine follow-up  Has been walking more- regularly several times per week for 2 miles  DIABETES  Taking and tolerating: yes: Lantus 30 and metfomrin Fasting blood sugars:120-150's Hypoglycemic symptoms: no Visual problems: no Monitoring feet: yes Numbness/Tingling: yes, b ilateral right > left Last eye exam:UTD Diabetic Labs:   A1C at work 8.1% May 2014 Lab Results  Component Value Date   HGBA1C 8.8* 04/23/2012   HGBA1C 7.2 12/12/2011   HGBA1C 6.7 08/16/2011   Lab Results  Component Value Date   LDLCALC 81 04/23/2012   CREATININE 0.8 04/23/2012   Last microalbumin: No results found for this basename: MICROALBUR, MALB24HUR   HYPERTENSION  BP Readings from Last 3 Encounters:  09/30/12 132/76  04/30/12 129/79  04/30/12 137/83    Hypertension ROS: taking medications as instructed, no medication side effects noted, no chest pain on exertion, no dyspnea on exertion and no swelling of ankles.   Neuropathy: Notices increase in burning, numb feeling of feet.  Worse at the end of the day.  Both feet, but right is worse.  Recently got larger shoes to give her toes more room.      Review of Systems See HPI    Objective:   Physical Exam GEN: Alert & Oriented, No acute distress CV:  Regular Rate & Rhythm, no murmur Respiratory:  Normal work of breathing, CTAB Abd:  + BS, soft, no tenderness to palpation Ext: no pre-tibial edema FEet:  Healthy skin, 2+ pulses.  Normal monofilament exam.     Assessment & Plan:

## 2012-09-30 NOTE — Assessment & Plan Note (Signed)
Back down to 27.9 from 40 5 months ago.  Was not takign monthly or daily supplement.  Will refill Vitamin D 50K monthly.

## 2012-09-30 NOTE — Patient Instructions (Addendum)
I refilled your vitamin D  Increase Lantus to 32 units a day  Keep up the good work with your walking!  Gabapentin:  Medicine for burning/numb feet.  Start with one tab before bedtime.  You can move up to twice a day, then 3 times a day if you need it.  Main side effects is sleepiness  Follow-up in 3 months

## 2012-09-30 NOTE — Assessment & Plan Note (Signed)
Improved at 8.1% - see scanned report.   She is improving walking.  Will increase lantus from 30 to 32.  Follow-up in 3 months

## 2012-09-30 NOTE — Assessment & Plan Note (Signed)
Encouraged comfortable shoes with wide toe box, will start gabapentin 300 mg, QHS, titrate up to TID

## 2012-09-30 NOTE — Assessment & Plan Note (Signed)
Well controlled today.  Continue current meds 

## 2012-10-14 ENCOUNTER — Other Ambulatory Visit: Payer: Self-pay | Admitting: Family Medicine

## 2012-10-14 DIAGNOSIS — Z1231 Encounter for screening mammogram for malignant neoplasm of breast: Secondary | ICD-10-CM

## 2012-10-22 ENCOUNTER — Ambulatory Visit (HOSPITAL_COMMUNITY)
Admission: RE | Admit: 2012-10-22 | Discharge: 2012-10-22 | Disposition: A | Discharge status: Home / Self Care | Payer: PRIVATE HEALTH INSURANCE | Source: Ambulatory Visit | Attending: Family Medicine | Admitting: Family Medicine

## 2012-10-22 DIAGNOSIS — Z1231 Encounter for screening mammogram for malignant neoplasm of breast: Secondary | ICD-10-CM

## 2012-11-02 ENCOUNTER — Emergency Department (INDEPENDENT_AMBULATORY_CARE_PROVIDER_SITE_OTHER): Payer: PRIVATE HEALTH INSURANCE

## 2012-11-02 ENCOUNTER — Emergency Department (HOSPITAL_COMMUNITY)
Admission: EM | Admit: 2012-11-02 | Discharge: 2012-11-02 | Disposition: A | Discharge status: Home / Self Care | Payer: PRIVATE HEALTH INSURANCE | Source: Home / Self Care | Attending: Emergency Medicine | Admitting: Emergency Medicine

## 2012-11-02 ENCOUNTER — Encounter (HOSPITAL_COMMUNITY): Payer: Self-pay | Admitting: Emergency Medicine

## 2012-11-02 DIAGNOSIS — M7582 Other shoulder lesions, left shoulder: Secondary | ICD-10-CM

## 2012-11-02 DIAGNOSIS — M67919 Unspecified disorder of synovium and tendon, unspecified shoulder: Secondary | ICD-10-CM

## 2012-11-02 DIAGNOSIS — M719 Bursopathy, unspecified: Secondary | ICD-10-CM

## 2012-11-02 MED ORDER — IBUPROFEN 800 MG PO TABS
800.0000 mg | ORAL_TABLET | Freq: Once | ORAL | Status: AC
  Administered 2012-11-02: 800 mg via ORAL

## 2012-11-02 MED ORDER — METHYLPREDNISOLONE ACETATE 80 MG/ML IJ SUSP
INTRAMUSCULAR | Status: AC
  Filled 2012-11-02: qty 1

## 2012-11-02 MED ORDER — METHYLPREDNISOLONE ACETATE 80 MG/ML IJ SUSP
80.0000 mg | Freq: Once | INTRAMUSCULAR | Status: AC
  Administered 2012-11-02: 80 mg via INTRAMUSCULAR

## 2012-11-02 MED ORDER — OXYCODONE-ACETAMINOPHEN 5-325 MG PO TABS: ORAL_TABLET | ORAL | Status: DC

## 2012-11-02 MED ORDER — KETOROLAC TROMETHAMINE 60 MG/2ML IM SOLN: 60.0000 mg | Freq: Once | INTRAMUSCULAR | Status: DC

## 2012-11-02 MED ORDER — IBUPROFEN 800 MG PO TABS
ORAL_TABLET | ORAL | Status: AC
  Filled 2012-11-02: qty 1

## 2012-11-02 NOTE — ED Provider Notes (Addendum)
Chief Complaint:   Chief Complaint  Patient presents with  . Shoulder Pain    left shoulder    History of Present Illness:   Kathy Duncan is a 57 year old female who has had a 3 to four-day history of pain in her left shoulder. She denies any injury but does do a lot of lifting of boxes at her work. She has pain with abduction. The pain is localized over the entire shoulder. There's been no swelling. She has a decreased range of motion. There is pain with abduction, flexion, internal and external rotation. She denies any radiation down the arm, numbness, tingling, or muscle weakness.  Review of Systems:  Other than noted above, the patient denies any of the following symptoms: Systemic:  No fevers, chills, sweats, or aches.  No fatigue or tiredness. Musculoskeletal:  No joint pain, arthritis, bursitis, swelling, back pain, or neck pain. Neurological:  No muscular weakness, paresthesias, headache, or trouble with speech or coordination.  No dizziness.  PMFSH:  Past medical history, family history, social history, meds, and allergies were reviewed.    Physical Exam:   Vital signs:  BP 123/72  Pulse 74  Temp(Src) 98.1 F (36.7 C) (Oral)  Resp 16  SpO2 100% Gen:  Alert and oriented times 3.  In no distress. Musculoskeletal: She has diffuse pain to palpation of the entire shoulder. The shoulder just has a few degrees of motion with pain in all directions. I was unable to perform any impingement test because of diminished range of motion. Otherwise, all joints had a full a ROM with no swelling, bruising or deformity.  No edema, pulses full. Extremities were warm and pink.  Capillary refill was brisk.  Skin:  Clear, warm and dry.  No rash. Neuro:  Alert and oriented times 3.  Muscle strength was normal.  Sensation was intact to light touch.   Radiology:  Dg Shoulder Left  11/02/2012   *RADIOLOGY REPORT*  Clinical Data: Shoulder pain the  LEFT SHOULDER - 2+ VIEW  Comparison: 08/05/2008.   Findings: Glenohumeral joint is intact.  No evidence of scapular fracture  or humeral fracture.  The acromioclavicular joint is intact with mild spurring.  IMPRESSION: No acute osseous abnormality.  No change from prior.   Original Report Authenticated By: Genevive Bi, M.D.   Course in Urgent Care Center:   She was placed in a shoulder sling although told to use this for only about 3 days, and then start to mobilize. She was given some shoulder exercises to do and suggested ice application.   Assessment:  The encounter diagnosis was Rotator cuff tendonitis, left.  Given Depo-Medrol 80 mg IM and ibuprofen 800 mg by mouth. Suggested followup with her primary care physician next week.  Plan:   1.  The following meds were prescribed:   Discharge Medication List as of 11/02/2012  1:54 PM    START taking these medications   Details  oxyCODONE-acetaminophen (PERCOCET) 5-325 MG per tablet 1 to 2 tablets every 6 hours as needed for pain., Print       2.  The patient was instructed in symptomatic care, including rest and activity, elevation, application of ice and compression.  Appropriate handouts were given. 3.  The patient was told to return if becoming worse in any way, if no better in 3 or 4 days, and given some red flag symptoms such as worsening pain or neurological symptoms that would indicate earlier return.   4.  The patient was told to  follow up with her primary care physician next week.    Reuben Likes, MD 11/02/12 1842  Reuben Likes, MD 11/02/12 [phone]

## 2012-11-02 NOTE — ED Notes (Signed)
Pt c/o left shoulder pain onset 3 days... Denies: inj/trauma, numbness, tingly... 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 Pain increases w/movement, limited ROM... Taking aleve for sxs w/no relief... She is alert and oriented w/no signs of acute distress.

## 2012-11-21 ENCOUNTER — Other Ambulatory Visit: Payer: Self-pay

## 2012-12-17 ENCOUNTER — Ambulatory Visit (INDEPENDENT_AMBULATORY_CARE_PROVIDER_SITE_OTHER): Payer: PRIVATE HEALTH INSURANCE | Admitting: Family Medicine

## 2012-12-17 ENCOUNTER — Encounter: Payer: Self-pay | Admitting: Family Medicine

## 2012-12-17 ENCOUNTER — Other Ambulatory Visit: Payer: Self-pay | Admitting: Family Medicine

## 2012-12-17 VITALS — BP 123/67 | HR 81 | Temp 98.8°F | Ht 67.0 in | Wt 197.0 lb

## 2012-12-17 DIAGNOSIS — Z0000 Encounter for general adult medical examination without abnormal findings: Secondary | ICD-10-CM

## 2012-12-17 DIAGNOSIS — E569 Vitamin deficiency, unspecified: Secondary | ICD-10-CM

## 2012-12-17 DIAGNOSIS — E119 Type 2 diabetes mellitus without complications: Secondary | ICD-10-CM

## 2012-12-17 DIAGNOSIS — M25512 Pain in left shoulder: Secondary | ICD-10-CM

## 2012-12-17 DIAGNOSIS — M25519 Pain in unspecified shoulder: Secondary | ICD-10-CM

## 2012-12-17 DIAGNOSIS — E1165 Type 2 diabetes mellitus with hyperglycemia: Secondary | ICD-10-CM | POA: Insufficient documentation

## 2012-12-17 LAB — POCT GLYCOSYLATED HEMOGLOBIN (HGB A1C): Hemoglobin A1C: 8.3

## 2012-12-17 NOTE — Assessment & Plan Note (Signed)
Exam normal. Mammogram recently done was normal. Colonoscopy due in 2018. Normal pelvic exam,no need for PAP since she had hysterectomy.

## 2012-12-17 NOTE — Patient Instructions (Addendum)

## 2012-12-17 NOTE — Assessment & Plan Note (Signed)
Improving,continue home exercise. i recommended Ibuprofen prn. F/U soon if no improvement.

## 2012-12-17 NOTE — Assessment & Plan Note (Signed)
Vit B12 and D checked. If low will refill her Vit D 50000 unit otherwise might take OTC vit D supplement.

## 2012-12-17 NOTE — Assessment & Plan Note (Signed)
A1c and BMP checked today, I will adjust her medication appropriately.

## 2012-12-17 NOTE — Progress Notes (Signed)
Patient ID: Kathy Duncan, female   DOB: [birth year], 57 y.o.   MRN: [id number] Subjective:   Well adult:  Kathy Duncan is a 57 y.o. female and is here for a comprehensive physical exam. The patient reports problems - Left shoulder pain and body pain with cramping on and off. Was recently at the ED for shoulder pain. Used pain medication that helped as well as home physical exam..She mentioned she got colonoscopy done which was normal and not due for another till 2016. Shoulder pain: Left shoulder pain for few week,had ER visit,has improved on pain medical and home exercise. DM 2:Here for follow up. Vit deficiency: Need refill of Vit D 50000 unit.   G2P2,  No abnormal vaginal bleeding or discharge. WUX:[id number] many years ago.    History   Social History  . Marital Status: Married    Spouse Name: N/A    Number of Children: N/A  . Years of Education: N/A   Occupational History  . Not on file.   Social History Main Topics  . Smoking status: Passive Smoke Exposure - Never Smoker  . Smokeless tobacco: Not on file  . Alcohol Use: No  . Drug Use: Not on file  . Sexually Active: Not on file   Other Topics Concern  . Not on file   Social History Narrative   Works at [employer], does some heavy lifting.  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   Health Maintenance  Topic Date Due  . Urine Microalbumin  08/15/2012  . Hemoglobin A1c  10/22/2012  . Ophthalmology Exam  01/03/2013  . Influenza Vaccine  01/13/2013  . Foot Exam  09/30/2013  . Mammogram  10/23/2014  . Colonoscopy  05/22/2016  . Pneumococcal Polysaccharide Vaccine (#2) 04/30/2017  . Tetanus/tdap  09/11/2021    The following portions of the patient's history were reviewed and updated as appropriate: allergies, current medications, past family history, past medical history, past social history, past surgical history and problem list.  Review of Systems Pertinent items are noted in HPI.   Objective:     BP 123/67  Pulse 81  Temp(Src) 98.8 F (37.1 C) (Oral)  Ht 5\' 7"  (1.702 m)  Wt 197 lb (89.359 kg)  BMI 30.85 kg/m2 General appearance: alert Head: Normocephalic, without obvious abnormality, atraumatic Eyes: conjunctivae/corneas clear. PERRL, EOM's intact. Fundi benign. Ears: normal TM's and external ear canals both ears Throat: lips, mucosa, and tongue normal; teeth and gums normal Neck: no adenopathy, no carotid bruit, no JVD, supple, symmetrical, trachea midline and thyroid not enlarged, symmetric, no tenderness/mass/nodules Lungs: clear to auscultation bilaterally  Heart: regular rate and rhythm, S1, S2 normal, no murmur, click, rub or gallop Abdomen: soft, non-tender; bowel sounds normal; no masses,  no organomegaly Pelvic: external genitalia normal, no adnexal masses or tenderness, uterus surgically absent, vagina normal without discharge and Cervix not visible on speculum exam Extremities: extremities normal, atraumatic, no cyanosis or edema Pulses: 2+ and symmetric Skin: Skin color, texture, turgor normal. No rashes or lesions Lymph nodes: Cervical, supraclavicular, and axillary nodes normal. Neurologic: Grossly normal   Psychiatry: Depression screening done PHQ2=0 Assessment:    Healthy female exam. Normal exam     Ibuprofen recommended prn shoulder pain,soulder exercise instruction also given. Plan:     See After Visit Summary for Counseling Recommendations

## 2012-12-18 ENCOUNTER — Telehealth: Payer: Self-pay | Admitting: Family Medicine

## 2012-12-18 ENCOUNTER — Telehealth: Payer: Self-pay | Admitting: *Deleted

## 2012-12-18 LAB — BASIC METABOLIC PANEL
BUN: 17 mg/dL (ref 6–23)
CO2: 27 mEq/L (ref 19–32)
Calcium: 9.2 mg/dL (ref 8.4–10.5)
Chloride: 103 mEq/L (ref 96–112)
Creat: 0.98 mg/dL (ref 0.50–1.10)
Glucose, Bld: 155 mg/dL — ABNORMAL HIGH (ref 70–99)
Potassium: 4.2 mEq/L (ref 3.5–5.3)
Sodium: 138 mEq/L (ref 135–145)

## 2012-12-18 LAB — VITAMIN B12: Vitamin B-12: 498 pg/mL (ref 211–911)

## 2012-12-18 LAB — VITAMIN D 25 HYDROXY (VIT D DEFICIENCY, FRACTURES): Vit D, 25-Hydroxy: 32 ng/mL (ref 30–89)

## 2012-12-18 NOTE — Telephone Encounter (Signed)
Left message for pt regarding her A1C results.  Jazmin Hartsell,CMA

## 2012-12-18 NOTE — Telephone Encounter (Signed)
Message copied by Henri Medal on Wed Dec 18, 2012  4:22 PM ------      Message from: Kathy Duncan      Created: Tue Dec 17, 2012  2:50 PM       Patient left before test result was available, I called to discuss with her,no response. Please call to let her know her A1C did not improve much hence I will recommend she increases her Lantus from 35 unit to 40 unit daily. Continue home capillary glucose check BID. ------

## 2012-12-18 NOTE — Telephone Encounter (Signed)
I called and spoke with patient about her A1c 8.3 despite compliance with Lantus 35 unit, I advised her to increase to 40 unit and check capillary glucose BID,she will come with the documentation of her readings in 2 wk.

## 2013-01-07 ENCOUNTER — Ambulatory Visit (INDEPENDENT_AMBULATORY_CARE_PROVIDER_SITE_OTHER): Payer: PRIVATE HEALTH INSURANCE | Admitting: Family Medicine

## 2013-01-07 ENCOUNTER — Encounter: Payer: Self-pay | Admitting: Family Medicine

## 2013-01-07 VITALS — BP 123/78 | HR 79 | Temp 98.4°F | Wt 201.0 lb

## 2013-01-07 DIAGNOSIS — E119 Type 2 diabetes mellitus without complications: Secondary | ICD-10-CM

## 2013-01-07 DIAGNOSIS — M25519 Pain in unspecified shoulder: Secondary | ICD-10-CM

## 2013-01-07 DIAGNOSIS — M25512 Pain in left shoulder: Secondary | ICD-10-CM

## 2013-01-07 DIAGNOSIS — E559 Vitamin D deficiency, unspecified: Secondary | ICD-10-CM

## 2013-01-07 DIAGNOSIS — I10 Essential (primary) hypertension: Secondary | ICD-10-CM

## 2013-01-07 NOTE — Assessment & Plan Note (Signed)
Continue ibuprofen prn and home exercise. She seem to be doing well on this.

## 2013-01-07 NOTE — Assessment & Plan Note (Signed)
Last Vit D checked was normal;32. May use OTc vit 2000 unit daily supplement.

## 2013-01-07 NOTE — Assessment & Plan Note (Signed)
Last A1C>8. Lantus increased from 35 unit to 40 units daily. She is compliant with medication. Diet and exercise counseling done. I referred her to health coach who was not available today to see her,hence will schedule at next visit. Continue home CBG check. F/U in 3 months.

## 2013-01-07 NOTE — Patient Instructions (Addendum)

## 2013-01-07 NOTE — Assessment & Plan Note (Signed)
BP optimal. Continue Hyzaar.

## 2013-01-07 NOTE — Progress Notes (Signed)
Subjective:     Patient ID: Kathy Duncan, female   DOB: 28-Jan-1956, 57 y.o.   MRN: [id number]  HPI DM/HTN: Here for follow up with her A1C that was >8. Since then she had gone up on her Lantus from 35 unit to 40 units daily as instructed,she is here with her CBG log ranging from 60 to 175,this morning CBG was 102,she denies hypoglycemic symptoms. Shoulder pain: Left shoulder pain improved,she has been doing her home shoulder exercise as instructed,here for follow up today. Vitamin D deficiency: here to follow up with her blood work,she was on Vit D 50000 unit daily before,last dose more than 4 wks ago. Denies bone pain.  Current Outpatient Prescriptions on File Prior to Visit  Medication Sig Dispense Refill  . aspirin EC 81 MG EC tablet Take 1 tablet (81 mg total) by mouth daily.  150 tablet  2  . ferrous sulfate 325 (65 FE) MG tablet Take 1 tablet (325 mg total) by mouth daily with breakfast.  30 tablet  5  . insulin glargine (LANTUS) 100 UNIT/ML injection Inject 40 Units into the skin daily. INJECT 40 UNITS SUBCUTANEOUSLY EVERY MORNING      . losartan-hydrochlorothiazide (HYZAAR) 100-25 MG per tablet Take 1 tablet by mouth daily.  30 tablet  11  . lovastatin (MEVACOR) 40 MG tablet TAKE 1 TABLET AT BEDTIME  30 tablet  5  . metFORMIN (GLUCOPHAGE) 1000 MG tablet Take 1 tablet (1,000 mg total) by mouth 2 (two) times daily with a meal.  60 tablet  11  . tamoxifen (NOLVADEX) 20 MG tablet TAKE 1 TABLET BY MOUTH EVERY DAY  90 tablet  8  . fluticasone (FLONASE) 50 MCG/ACT nasal spray Place 2 sprays into the nose daily.  16 g  6  . gabapentin (NEURONTIN) 300 MG capsule Take 1 capsule (300 mg total) by mouth 3 (three) times daily.  90 capsule  3   No current facility-administered medications on file prior to visit.   Past Medical History  Diagnosis Date  . Hypertension   . Diabetes mellitus   . Arthritis       Review of Systems  Constitutional: Negative for fatigue.  Respiratory:  Negative.   Cardiovascular: Negative.   Gastrointestinal: Negative.   Musculoskeletal: Positive for arthralgias.  Neurological: Negative.   All other systems reviewed and are negative.   Filed Vitals:   01/07/13 0955  BP: 123/78  Pulse: 79  Temp: 98.4 F (36.9 C)  TempSrc: Oral  Weight: 201 lb (91.173 kg)       Objective:   Physical Exam  Nursing note and vitals reviewed. Constitutional: She appears well-developed. No distress.  Cardiovascular: Normal rate, regular rhythm, normal heart sounds and intact distal pulses.   No murmur heard. Pulmonary/Chest: Effort normal and breath sounds normal. No respiratory distress. She has no wheezes.  Abdominal: Soft. Bowel sounds are normal. She exhibits no distension. There is no tenderness.  Musculoskeletal: Normal range of motion. She exhibits no edema.       Right shoulder: Normal.       Left shoulder: Normal.       Assessment/Plan:     DM/HTN: Shoulder pain: Vitamin D deficiency:

## 2013-02-17 ENCOUNTER — Other Ambulatory Visit: Payer: Self-pay | Admitting: *Deleted

## 2013-02-17 DIAGNOSIS — N6089 Other benign mammary dysplasias of unspecified breast: Secondary | ICD-10-CM

## 2013-02-17 MED ORDER — TAMOXIFEN CITRATE 20 MG PO TABS: ORAL_TABLET | ORAL | Status: DC

## 2013-03-13 ENCOUNTER — Other Ambulatory Visit: Payer: Self-pay | Admitting: Family Medicine

## 2013-03-14 ENCOUNTER — Other Ambulatory Visit: Payer: Self-pay | Admitting: Family Medicine

## 2013-03-14 NOTE — Telephone Encounter (Signed)
This was refilled yesterday.

## 2013-03-24 ENCOUNTER — Telehealth: Payer: Self-pay | Admitting: Family Medicine

## 2013-03-24 NOTE — Telephone Encounter (Signed)
I called to discuss her lab result I got from [facility] done on 03/20/13. A1C 8.7 which is worse from the last one done in Aug. Vit D 19. Patient stated she has been taking Lantus 40 unit as instructed as well as her metformin 1000 mg BID. I recommended adding Glipizide to her medication. She stated she will like to wait till Thursday 03/27/13 to discuss her result and management during her next visit with me.

## 2013-03-27 ENCOUNTER — Ambulatory Visit (INDEPENDENT_AMBULATORY_CARE_PROVIDER_SITE_OTHER): Payer: PRIVATE HEALTH INSURANCE | Admitting: Family Medicine

## 2013-03-27 ENCOUNTER — Encounter: Payer: Self-pay | Admitting: Family Medicine

## 2013-03-27 VITALS — BP 131/82 | HR 80 | Temp 97.8°F | Wt 201.3 lb

## 2013-03-27 DIAGNOSIS — Z23 Encounter for immunization: Secondary | ICD-10-CM

## 2013-03-27 DIAGNOSIS — E559 Vitamin D deficiency, unspecified: Secondary | ICD-10-CM

## 2013-03-27 DIAGNOSIS — I10 Essential (primary) hypertension: Secondary | ICD-10-CM

## 2013-03-27 DIAGNOSIS — E1165 Type 2 diabetes mellitus with hyperglycemia: Secondary | ICD-10-CM

## 2013-03-27 LAB — POCT GLYCOSYLATED HEMOGLOBIN (HGB A1C): Hemoglobin A1C: 8

## 2013-03-27 MED ORDER — INSULIN GLARGINE 100 UNIT/ML ~~LOC~~ SOLN: 45.0000 [IU] | Freq: Every day | SUBCUTANEOUS | Status: DC

## 2013-03-27 MED ORDER — VITAMIN D (ERGOCALCIFEROL) 1.25 MG (50000 UNIT) PO CAPS: 50000.0000 [IU] | ORAL_CAPSULE | ORAL | Status: DC

## 2013-03-27 NOTE — Assessment & Plan Note (Signed)
A1C done at work was 8.3, today in the office it was 8.0. She does not want to add new medication. Plan to continue  Metformin 1000mg  BID Increase Lantus to 45 unit daily. Reduce carb and fatty food and continue exercise as tolerated. Continue home glucose check and call if constantly less than 60 or if symptomatic. Otherwise f/u in 3 months.

## 2013-03-27 NOTE — Patient Instructions (Signed)
Kathy Duncan, Feig will like for you to go up on your Lantus to 40 units daily,continue Metformin 1000mg  BID, continue to check your glucose level at home 3 times daily,call if your glucose is consistently less than 60 or if you are having symptoms,like sweating, dizziness etc.

## 2013-03-27 NOTE — Progress Notes (Signed)
Subjective:     Patient ID: Kathy Duncan, female   DOB: 11/17/1955, 57 y.o.   MRN: [id number]  HPI DM: here for follow up with abnormal lab with elevated A1C done at work. She has been compliance with Metformin 1000mg  BID and Lantus 40 unit daily. Here home Gluc reading ranges from 102-180, yesterday at 8:30pm she had a reading of 180. Denies any hypoglycemic episode recently, but in the past when she used to walk at the park she would have low reading. WUJ:[id number] with her [employer], here for follow up. Vit D:Here to follow up with abnormal lab from work.  Current Outpatient Prescriptions on File Prior to Visit  Medication Sig Dispense Refill  . aspirin EC 81 MG EC tablet Take 1 tablet (81 mg total) by mouth daily.  150 tablet  2  . ferrous sulfate 325 (65 FE) MG tablet Take 1 tablet (325 mg total) by mouth daily with breakfast.  30 tablet  5  . insulin glargine (LANTUS) 100 UNIT/ML injection Inject 40 Units into the skin daily. INJECT 40 UNITS SUBCUTANEOUSLY EVERY MORNING      . losartan-hydrochlorothiazide (HYZAAR) 100-25 MG per tablet Take 1 tablet by mouth daily.  30 tablet  11  . lovastatin (MEVACOR) 40 MG tablet TAKE 1 TABLET AT BEDTIME  30 tablet  5  . metFORMIN (GLUCOPHAGE) 1000 MG tablet Take 1 tablet (1,000 mg total) by mouth 2 (two) times daily with a meal.  60 tablet  11  . tamoxifen (NOLVADEX) 20 MG tablet TAKE 1 TABLET BY MOUTH EVERY DAY  90 tablet  0  . fluticasone (FLONASE) 50 MCG/ACT nasal spray Place 2 sprays into the nose daily.  16 g  6   No current facility-administered medications on file prior to visit.   Past Medical History  Diagnosis Date  . Hypertension   . Diabetes mellitus   . Arthritis       Review of Systems  Respiratory: Negative.   Cardiovascular: Negative.   Gastrointestinal: Negative.   Genitourinary: Negative.   All other systems reviewed and are negative.   Filed Vitals:   03/27/13 1548  BP: 131/82  Pulse: 80  Temp: 97.8 F (36.6  C)  TempSrc: Oral  Weight: 201 lb 4.8 oz (91.309 kg)       Objective:   Physical Exam  Nursing note and vitals reviewed. Constitutional: She is oriented to person, place, and time. She appears well-developed. No distress.  Cardiovascular: Normal rate, regular rhythm, normal heart sounds and intact distal pulses.   No murmur heard. Pulmonary/Chest: Effort normal and breath sounds normal. No respiratory distress. She has no wheezes. She exhibits no tenderness.  Abdominal: Soft. Bowel sounds are normal. She exhibits no distension and no mass. There is no tenderness.  Musculoskeletal: Normal range of motion. She exhibits no edema.  Neurological: She is alert and oriented to person, place, and time.       Assessment:     DM2 HTN Vit D deficiency     Plan:     Check problem list.

## 2013-03-27 NOTE — Assessment & Plan Note (Signed)
Vit D level of 19 with lab done from her work place. To start Vit 50000unit weekly. I e-prescribed her meds.

## 2013-03-27 NOTE — Assessment & Plan Note (Signed)
Blood pressure looks good. Continue H[employer]ar.

## 2013-04-29 ENCOUNTER — Other Ambulatory Visit: Payer: Self-pay | Admitting: *Deleted

## 2013-04-29 DIAGNOSIS — N6099 Unspecified benign mammary dysplasia of unspecified breast: Secondary | ICD-10-CM

## 2013-04-30 ENCOUNTER — Ambulatory Visit (HOSPITAL_BASED_OUTPATIENT_CLINIC_OR_DEPARTMENT_OTHER): Payer: PRIVATE HEALTH INSURANCE | Admitting: Adult Health

## 2013-04-30 ENCOUNTER — Other Ambulatory Visit (HOSPITAL_BASED_OUTPATIENT_CLINIC_OR_DEPARTMENT_OTHER): Payer: PRIVATE HEALTH INSURANCE

## 2013-04-30 ENCOUNTER — Telehealth: Payer: Self-pay | Admitting: Oncology

## 2013-04-30 VITALS — BP 132/77 | HR 106 | Temp 98.2°F | Resp 20 | Ht 67.0 in | Wt 203.0 lb

## 2013-04-30 DIAGNOSIS — N6099 Unspecified benign mammary dysplasia of unspecified breast: Secondary | ICD-10-CM

## 2013-04-30 DIAGNOSIS — N6089 Other benign mammary dysplasias of unspecified breast: Secondary | ICD-10-CM

## 2013-04-30 LAB — COMPREHENSIVE METABOLIC PANEL (CC13)
ALT: 9 U/L (ref 0–55)
AST: 14 U/L (ref 5–34)
Albumin: 3.4 g/dL — ABNORMAL LOW (ref 3.5–5.0)
Alkaline Phosphatase: 60 U/L (ref 40–150)
Anion Gap: 8 mEq/L (ref 3–11)
BUN: 12.3 mg/dL (ref 7.0–26.0)
CO2: 26 mEq/L (ref 22–29)
Calcium: 8.6 mg/dL (ref 8.4–10.4)
Chloride: 105 mEq/L (ref 98–109)
Creatinine: 0.8 mg/dL (ref 0.6–1.1)
Glucose: 180 mg/dl — ABNORMAL HIGH (ref 70–140)
Potassium: 3.8 mEq/L (ref 3.5–5.1)
Sodium: 139 mEq/L (ref 136–145)
Total Bilirubin: <0.2 mg/dL (ref 0.20–1.20)
Total Protein: 6.9 g/dL (ref 6.4–8.3)

## 2013-04-30 LAB — CBC WITH DIFFERENTIAL/PLATELET
BASO%: 0.7 % (ref 0.0–2.0)
Basophils Absolute: 0 10*3/uL (ref 0.0–0.1)
EOS%: 3.7 % (ref 0.0–7.0)
Eosinophils Absolute: 0.2 10*3/uL (ref 0.0–0.5)
HCT: 34.8 % (ref 34.8–46.6)
HGB: 11.2 g/dL — ABNORMAL LOW (ref 11.6–15.9)
LYMPH%: 43.2 % (ref 14.0–49.7)
MCH: 25.9 pg (ref 25.1–34.0)
MCHC: 32.2 g/dL (ref 31.5–36.0)
MCV: 80.3 fL (ref 79.5–101.0)
MONO#: 0.3 10*3/uL (ref 0.1–0.9)
MONO%: 6.2 % (ref 0.0–14.0)
NEUT#: 2.5 10*3/uL (ref 1.5–6.5)
NEUT%: 46.2 % (ref 38.4–76.8)
Platelets: 258 10*3/uL (ref 145–400)
RBC: 4.33 10*6/uL (ref 3.70–5.45)
RDW: 14.7 % — ABNORMAL HIGH (ref 11.2–14.5)
WBC: 5.3 10*3/uL (ref 3.9–10.3)
lymph#: 2.3 10*3/uL (ref 0.9–3.3)

## 2013-04-30 MED ORDER — TAMOXIFEN CITRATE 20 MG PO TABS: ORAL_TABLET | ORAL | Status: DC

## 2013-04-30 NOTE — Progress Notes (Signed)
[facility]  High Risk Clinic Follow-up Visit  Name: Kathy Duncan            Date: 04/30/2013 MRN: [id number]                DOB: 06-23-1955  CC: Chevis Pretty, MD  REASON FOR VISIT: History atypical ductal hyperplasia, high risk breast cancer  HISTORY OF PRESENT ILLNESS: Remains on Tamoxifen daily.  She is doing well today.  She is tolerating the Tamoxifen without any difficulty.  We updated her health maintenance below.  She denies any problems with taking the daily Tamoxifen.  A 10 point ROS is negative.   CURRENT BREAST MEDICATIONS: Tamoxifen 20 mg daily. Began 01/2010.   ALLERGIES: Ace inhibitors  CHANGE IN FAMILY HISTORY:  None.  Health Maintenance Mammogram: 10/2012 Colonoscopy:2006 Bone Density Scan: n/a Pap Smear: s/p TAH/BSO Eye Exam: 12/2011 Vitamin D Level: n/a Lipid Panel: 11/2011   REVIEW OF SYSTEMS:  A 10 point review of systems was conducted and is otherwise negative except for what is noted above.    PHYSICAL EXAM: BP 132/77  Pulse 106  Temp(Src) 98.2 F (36.8 C) (Oral)  Resp 20  Ht 5\' 7"  (1.702 m)  Wt 203 lb (92.08 kg)  BMI 31.79 kg/m2 GENERAL: Well developed, well nourished, in no acute distress.  EENT: No ocular or oral lesions. No stomatitis.  RESPIRATORY: Lungs are clear to auscultation bilaterally with normal respiratory movement and no accessory muscle use. CARDIAC: No murmur, rub or tachycardia. No upper or lower extremity edema.  GI: Abdomen is soft, no palpable hepatosplenomegaly. No fluid wave. No tenderness. Musculoskeletal: No kyphosis, no tenderness over the spine, ribs or hips. Lymph: No cervical, infraclavicular, axillary or inguinal adenopathy. Neuro: No focal neurological deficits. Psych: Alert and oriented X 3, appropriate mood and affect.  BREAST EXAM: In the supine position, with the right arm over the head, right nipple is everted. No periareolar edema or nipple discharge. No mass in any quadrant or  subareolar region. Remote lumpectomy incision, 11 o'clock position. 6 o'clock position, ovoid scar, 11 mm. No redness of the skin. No right axillary adenopathy. With the left arm over the head, left nipple is everted. No periareolar edema or nipple discharge. No mass in any quadrant or subareolar region. No redness of the skin. No left axillary adenopathy.    ASSESSMENT: 57 y/o female with: 1. 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 No evidence of malignancy. 2. Mammo last in June 2014.   3. On Tamoxifen since September, 2001 with good tolerance. No appreciable side effects.   PLAN:  1. Remain on Tamoxifen 20 mg daily. 2. Return to clinic in one year.   3. Mammo due in June 2015.

## 2013-04-30 NOTE — Telephone Encounter (Signed)
gv and printd appt sched and avs for pt for DEC 2015

## 2013-05-01 ENCOUNTER — Encounter: Payer: Self-pay | Admitting: Adult Health

## 2013-05-27 ENCOUNTER — Other Ambulatory Visit: Payer: Self-pay | Admitting: Family Medicine

## 2013-05-27 MED ORDER — METFORMIN HCL 1000 MG PO TABS: 1000.0000 mg | ORAL_TABLET | Freq: Two times a day (BID) | ORAL | Status: DC

## 2013-07-24 LAB — HEMOGLOBIN A1C: Hgb A1c MFr Bld: 7.2 % — AB (ref 4.0–6.0)

## 2013-07-29 ENCOUNTER — Ambulatory Visit (INDEPENDENT_AMBULATORY_CARE_PROVIDER_SITE_OTHER): Payer: PRIVATE HEALTH INSURANCE | Admitting: Family Medicine

## 2013-07-29 ENCOUNTER — Encounter: Payer: Self-pay | Admitting: *Deleted

## 2013-07-29 ENCOUNTER — Other Ambulatory Visit (HOSPITAL_COMMUNITY)
Admission: RE | Admit: 2013-07-29 | Discharge: 2013-07-29 | Disposition: A | Discharge status: Home / Self Care | Payer: PRIVATE HEALTH INSURANCE | Source: Ambulatory Visit | Attending: Family Medicine | Admitting: Family Medicine

## 2013-07-29 ENCOUNTER — Encounter: Payer: Self-pay | Admitting: Family Medicine

## 2013-07-29 VITALS — BP 114/78 | HR 82 | Temp 98.4°F | Wt 191.0 lb

## 2013-07-29 DIAGNOSIS — Z113 Encounter for screening for infections with a predominantly sexual mode of transmission: Secondary | ICD-10-CM | POA: Insufficient documentation

## 2013-07-29 DIAGNOSIS — N939 Abnormal uterine and vaginal bleeding, unspecified: Secondary | ICD-10-CM

## 2013-07-29 DIAGNOSIS — E119 Type 2 diabetes mellitus without complications: Secondary | ICD-10-CM

## 2013-07-29 DIAGNOSIS — N899 Noninflammatory disorder of vagina, unspecified: Secondary | ICD-10-CM

## 2013-07-29 DIAGNOSIS — E118 Type 2 diabetes mellitus with unspecified complications: Principal | ICD-10-CM

## 2013-07-29 DIAGNOSIS — N898 Other specified noninflammatory disorders of vagina: Secondary | ICD-10-CM | POA: Insufficient documentation

## 2013-07-29 DIAGNOSIS — E1165 Type 2 diabetes mellitus with hyperglycemia: Secondary | ICD-10-CM

## 2013-07-29 DIAGNOSIS — I10 Essential (primary) hypertension: Secondary | ICD-10-CM

## 2013-07-29 DIAGNOSIS — IMO0002 Reserved for concepts with insufficient information to code with codable children: Secondary | ICD-10-CM

## 2013-07-29 HISTORY — DX: Abnormal uterine and vaginal bleeding, unspecified: N93.9

## 2013-07-29 MED ORDER — INSULIN GLARGINE 100 UNIT/ML ~~LOC~~ SOLN: 45.0000 [IU] | Freq: Every day | SUBCUTANEOUS | Status: DC

## 2013-07-29 NOTE — Assessment & Plan Note (Signed)
BP well controlled.

## 2013-07-29 NOTE — Assessment & Plan Note (Signed)
Normal vaginal discharge observed. Patient requested STI screening. GC/Chlamydia checked today, I will follow up with result.

## 2013-07-29 NOTE — Patient Instructions (Signed)
Ms Kathy Duncan, it was nice seeing you today, I am glad your A1c improved some but not goal, we will still have you take Lantus 45 unit, but if your sugar is about 60-100 then you can take 40 unit instead. For your vaginal spotting this could be related to your Tamoxifen, good thing you don't have uterus so you chance of uterine cancer is low, but with the exam today you were bleeding with touch to your vaginal wall. I will still want you to see a gynecologist for a second opinion

## 2013-07-29 NOTE — Assessment & Plan Note (Signed)
R/O malignancy. No obvious polyps or lesion on pelvic exam. Cervix and uterus absent. + bleeding to touch of her vaginal wall. May benefit from biopsy. I recommended Gyn assessment. Patient referred to gynecologist for further assessment.

## 2013-07-29 NOTE — Progress Notes (Signed)
Subjective:     Patient ID: Kathy Duncan, female   DOB: 1956/03/16, 58 y.o.   MRN: [id number]  HPI DM/HTN:Here for follow up, compliant with all medication except Lantus, she is supposed to be on Lantus 45 unit but was taking 40 unit,she forgot she was supposed to go up on her medication,she denies any hypoglycemic episode, she is working on her diet and exercise. She had her A1c checked 3 days ago at work, here to discuss result. Vaginal spotting/Discharge:C/O spotting on and off for the last few months, last episode was 4 months ago,she denies any heavy vaginal bleeding. 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 She also complain of vaginal discharge which has improved a little,she is sexually active with same partner of many years,she would like to be screened for STI.  Current Outpatient Prescriptions on File Prior to Visit  Medication Sig Dispense Refill  . aspirin EC 81 MG EC tablet Take 1 tablet (81 mg total) by mouth daily.  150 tablet  2  . ferrous sulfate 325 (65 FE) MG tablet Take 1 tablet (325 mg total) by mouth daily with breakfast.  30 tablet  5  . losartan-hydrochlorothiazide (HYZAAR) 100-25 MG per tablet Take 1 tablet by mouth daily.  30 tablet  11  . lovastatin (MEVACOR) 40 MG tablet TAKE 1 TABLET AT BEDTIME  30 tablet  5  . metFORMIN (GLUCOPHAGE) 1000 MG tablet Take 1 tablet (1,000 mg total) by mouth 2 (two) times daily with a meal.  60 tablet  5  . tamoxifen (NOLVADEX) 20 MG tablet TAKE 1 TABLET BY MOUTH EVERY DAY  90 tablet  12  . Vitamin D, Ergocalciferol, (DRISDOL) 50000 UNITS CAPS capsule Take 1 capsule (50,000 Units total) by mouth every 7 (seven) days.  30 capsule  2  . fluticasone (FLONASE) 50 MCG/ACT nasal spray Place 2 sprays into the nose daily.  16 g  6  . insulin glargine (LANTUS) 100 UNIT/ML injection Inject 0.45 mLs (45 Units total) into the skin daily. INJECT 45 UNITS  SUBCUTANEOUSLY EVERY MORNING  10 mL  3   No current facility-administered medications on file prior to visit.   Past Medical History  Diagnosis Date  . Hypertension   . Diabetes mellitus   . Arthritis      Review of Systems  Respiratory: Negative.   Cardiovascular: Negative.   Gastrointestinal: Negative.   Genitourinary: Positive for vaginal bleeding and vaginal discharge.  All other systems reviewed and are negative.       Filed Vitals:   07/29/13 0832  BP: 114/78  Pulse: 82  Temp: 98.4 F (36.9 C)  TempSrc: Oral  Weight: 191 lb (86.637 kg)    Objective:   Physical Exam  Nursing note and vitals reviewed. Constitutional: She appears well-developed. No distress.  Cardiovascular: Normal rate, regular rhythm, normal heart sounds and intact distal pulses.   No murmur heard. Pulmonary/Chest: Effort normal and breath sounds normal. No respiratory distress. She has no wheezes.  Abdominal: Soft. Bowel sounds are normal. She exhibits no distension and no mass. There is no tenderness.  Genitourinary: No labial fusion. There is no rash, tenderness, lesion or injury on the right labia. There is no rash, tenderness, lesion or injury on the left labia. Right adnexum displays no mass. Left adnexum displays no mass. There is bleeding around the vagina. No tenderness around the vagina. No vaginal discharge found.  Musculoskeletal: Normal range of motion.       Assessment:     DM HTN Vaginal bleeding/spotting Vaginal discharge     Plan:     Check problem list.

## 2013-07-29 NOTE — Assessment & Plan Note (Signed)
Improved some from last visit. A1C checked at her place of work 3 days ago was 7.2 from 8 Plan to continue Lantus 45 unit daily. She is advised to take 40 unit instead if her home glucose check remains between 60-100. She agreed with plan and verbalized understanding. F/U in 3 months for reassessment.

## 2013-07-30 LAB — CERVICOVAGINAL ANCILLARY ONLY
Chlamydia: NEGATIVE
Neisseria Gonorrhea: NEGATIVE

## 2013-08-07 ENCOUNTER — Telehealth: Payer: Self-pay | Admitting: Family Medicine

## 2013-08-07 NOTE — Telephone Encounter (Signed)
Message left for her to call back to discuss her chlamydia and gonorrhea test result which are both negative.

## 2013-08-26 ENCOUNTER — Other Ambulatory Visit: Payer: Self-pay | Admitting: Adult Health

## 2013-09-16 ENCOUNTER — Telehealth: Payer: Self-pay | Admitting: Oncology

## 2013-09-16 NOTE — Telephone Encounter (Signed)
Faxed pt medical records to [facility]

## 2013-09-29 ENCOUNTER — Other Ambulatory Visit: Payer: Self-pay | Admitting: Family Medicine

## 2013-09-29 DIAGNOSIS — Z1231 Encounter for screening mammogram for malignant neoplasm of breast: Secondary | ICD-10-CM

## 2013-10-14 ENCOUNTER — Telehealth: Payer: Self-pay | Admitting: Family Medicine

## 2013-10-14 ENCOUNTER — Ambulatory Visit (HOSPITAL_COMMUNITY)
Admission: RE | Admit: 2013-10-14 | Discharge: 2013-10-14 | Disposition: A | Discharge status: Home / Self Care | Payer: PRIVATE HEALTH INSURANCE | Source: Ambulatory Visit | Attending: Family Medicine | Admitting: Family Medicine

## 2013-10-14 ENCOUNTER — Ambulatory Visit (INDEPENDENT_AMBULATORY_CARE_PROVIDER_SITE_OTHER): Payer: PRIVATE HEALTH INSURANCE | Admitting: Family Medicine

## 2013-10-14 ENCOUNTER — Encounter: Payer: Self-pay | Admitting: Family Medicine

## 2013-10-14 VITALS — BP 136/80 | HR 68 | Temp 98.3°F | Ht 67.0 in | Wt 187.0 lb

## 2013-10-14 DIAGNOSIS — M67439 Ganglion, unspecified wrist: Secondary | ICD-10-CM

## 2013-10-14 DIAGNOSIS — L304 Erythema intertrigo: Secondary | ICD-10-CM

## 2013-10-14 DIAGNOSIS — R2230 Localized swelling, mass and lump, unspecified upper limb: Secondary | ICD-10-CM

## 2013-10-14 DIAGNOSIS — E118 Type 2 diabetes mellitus with unspecified complications: Principal | ICD-10-CM

## 2013-10-14 DIAGNOSIS — M25539 Pain in unspecified wrist: Secondary | ICD-10-CM | POA: Insufficient documentation

## 2013-10-14 DIAGNOSIS — E1165 Type 2 diabetes mellitus with hyperglycemia: Secondary | ICD-10-CM

## 2013-10-14 DIAGNOSIS — IMO0002 Reserved for concepts with insufficient information to code with codable children: Secondary | ICD-10-CM

## 2013-10-14 DIAGNOSIS — L538 Other specified erythematous conditions: Secondary | ICD-10-CM

## 2013-10-14 DIAGNOSIS — R229 Localized swelling, mass and lump, unspecified: Secondary | ICD-10-CM

## 2013-10-14 DIAGNOSIS — I10 Essential (primary) hypertension: Secondary | ICD-10-CM

## 2013-10-14 DIAGNOSIS — M6740 Ganglion, unspecified site: Secondary | ICD-10-CM

## 2013-10-14 HISTORY — DX: Ganglion, unspecified wrist: M67.439

## 2013-10-14 LAB — POCT GLYCOSYLATED HEMOGLOBIN (HGB A1C): Hemoglobin A1C: 6.7

## 2013-10-14 MED ORDER — NYSTATIN-TRIAMCINOLONE 100000-0.1 UNIT/GM-% EX OINT: 1.0000 "application " | TOPICAL_OINTMENT | Freq: Two times a day (BID) | CUTANEOUS | Status: DC

## 2013-10-14 MED ORDER — METFORMIN HCL 1000 MG PO TABS: 1000.0000 mg | ORAL_TABLET | Freq: Two times a day (BID) | ORAL | Status: DC

## 2013-10-14 NOTE — Patient Instructions (Signed)
Ganglion Cyst °A ganglion cyst is a noncancerous, fluid-filled lump that occurs near joints or tendons. The ganglion cyst grows out of a joint or the lining of a tendon. It most often develops in the hand or wrist but can also develop in the shoulder, elbow, hip, knee, ankle, or foot. The round or oval ganglion can be pea sized or larger than a grape. Increased activity may enlarge the size of the cyst because more fluid starts to build up.  °CAUSES  °It is not completely known what causes a ganglion cyst to grow. However, it may be related to: °· Inflammation or irritation around the joint. °· An injury. °· Repetitive movements or overuse. °· Arthritis. °SYMPTOMS  °A lump most often appears in the hand or wrist, but can occur in other areas of the body. Generally, the lump is painless without other symptoms. However, sometimes pain can be felt during activity or when pressure is applied to the lump. The lump may even be tender to the touch. Tingling, pain, numbness, or muscle weakness can occur if the ganglion cyst presses on a nerve. Your grip may be weak and you may have less movement in your joints.  °DIAGNOSIS  °Ganglion cysts are most often diagnosed based on a physical exam, noting where the cyst is and how it looks. Your caregiver will feel the lump and may shine a light alongside it. If it is a ganglion, a light often shines through it. Your caregiver may order an X-ray, ultrasound, or MRI to rule out other conditions. °TREATMENT  °Ganglions usually go away on their own without treatment. If pain or other symptoms are involved, treatment may be needed. Treatment is also needed if the ganglion limits your movement or if it gets infected. Treatment options include: °· Wearing a wrist or finger brace or splint. °· Taking anti-inflammatory medicine. °· Draining fluid from the lump with a needle (aspiration). °· Injecting a steroid into the joint. °· Surgery to remove the ganglion cyst and its stalk that is  attached to the joint or tendon. However, ganglion cysts can grow back. °HOME CARE INSTRUCTIONS  °· Do not press on the ganglion, poke it with a needle, or hit it with a heavy object. You may rub the lump gently and often. Sometimes fluid moves out of the cyst. °· Only take medicines as directed by your caregiver. °· Wear your brace or splint as directed by your caregiver. °SEEK MEDICAL CARE IF:  °· Your ganglion becomes larger or more painful. °· You have increased redness, red streaks, or swelling. °· You have pus coming from the lump. °· You have weakness or numbness in the affected area. °MAKE SURE YOU:  °· Understand these instructions. °· Will watch your condition. °· Will get help right away if you are not doing well or get worse. °Document Released: 04/28/2000 Document Revised: 01/24/2012 Document Reviewed: 06/25/2007 °ExitCare® Patient Information ©2014 [employer]. ° °

## 2013-10-14 NOTE — Telephone Encounter (Signed)
Message left: wrist xray normal. F/U for reassessment.

## 2013-10-14 NOTE — Assessment & Plan Note (Signed)
Eczematous vs candidal. Does not look typical of candida infection. Prescribed Nystatin triamcinolone to apply to rash. F/U instruction given if no improvement.

## 2013-10-14 NOTE — Assessment & Plan Note (Addendum)
Xray ordered to r/o bone tumor. May need surgical removal, some do dissolve on its own. Patient will watch for now if no improvement will consider surgical referral. I will call her with xray report.

## 2013-10-14 NOTE — Assessment & Plan Note (Signed)
A1C improved on Lantus 35 unit. May continue Lantus 35 unit with Metformin 1000 mg BID. F/U in 3 months.

## 2013-10-14 NOTE — Progress Notes (Signed)
Subjective:     Patient ID: Kathy Duncan, female   DOB: March 05, 1956, 58 y.o.   MRN: [id number]  HPI DD:[id number] with metformin 1000 mg BID, also she reduced her Lantus to 35 unit daily due to episodes of hypoglycemia. Her glucose has been great. WCB:[id number] with Hyzaar. Rash: Dry itchy rash under the breast, started 1 months ago gradually worsening, she put gold bond on it, but it does not help, she sweat a lot under her breast, she has been scratching it a lot. Knot on wrist: Wrist on her right wrist started 1 month ago,gradually getting bigger, at times painful,denies any injury on her wrist. 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  Current Outpatient Prescriptions on File Prior to Visit  Medication Sig Dispense Refill  . aspirin EC 81 MG EC tablet Take 1 tablet (81 mg total) by mouth daily.  150 tablet  2  . ferrous sulfate 325 (65 FE) MG tablet Take 1 tablet (325 mg total) by mouth daily with breakfast.  30 tablet  5  . insulin glargine (LANTUS) 100 UNIT/ML injection Inject 0.45 mLs (45 Units total) into the skin daily. INJECT 45 UNITS SUBCUTANEOUSLY EVERY MORNING  10 mL  3  . losartan-hydrochlorothiazide (HYZAAR) 100-25 MG per tablet Take 1 tablet by mouth daily.  30 tablet  11  . lovastatin (MEVACOR) 40 MG tablet TAKE 1 TABLET AT BEDTIME  30 tablet  5  . metFORMIN (GLUCOPHAGE) 1000 MG tablet Take 1 tablet (1,000 mg total) by mouth 2 (two) times daily with a meal.  60 tablet  5  . tamoxifen (NOLVADEX) 20 MG tablet TAKE 1 TABLET BY MOUTH EVERY DAY  90 tablet  12  . Vitamin D, Ergocalciferol, (DRISDOL) 50000 UNITS CAPS capsule Take 1 capsule (50,000 Units total) by mouth every 7 (seven) days.  30 capsule  2  . fluticasone (FLONASE) 50 MCG/ACT nasal spray Place 2 sprays into the nose daily.  16 g  6   No current facility-administered medications on file prior to visit.   Past Medical History  Diagnosis Date  . Hypertension   . Diabetes mellitus   .  Arthritis       Review of Systems  Respiratory: Negative.   Cardiovascular: Negative.   Musculoskeletal: Positive for joint swelling.       Knot on right wrist  Skin: Positive for rash.  All other systems reviewed and are negative.  Filed Vitals:   10/14/13 0843  BP: 136/80  Pulse: 68  Temp: 98.3 F (36.8 C)  TempSrc: Oral  Height: 5\' 7"  (1.702 m)  Weight: 187 lb (84.823 kg)       Objective:   Physical Exam  Nursing note and vitals reviewed. Constitutional: She is oriented to person, place, and time. She appears well-developed. No distress.  Cardiovascular: Normal rate, regular rhythm, normal heart sounds and intact distal pulses.   No murmur heard. Pulmonary/Chest: Effort normal and breath sounds normal. No respiratory distress. She has no wheezes.  Abdominal: Soft. Bowel sounds are normal. She exhibits no distension and no mass. There is no tenderness.  Musculoskeletal:       Right wrist: She exhibits normal range of motion and no tenderness.       Left wrist: Normal.       Arms: Neurological: She is alert and oriented to person, place, and time. No cranial nerve deficit.  Skin:          Assessment:     DM  HTN Rash: Candida intertrigo Ganglion cyst of right wrist.     Plan:     Check problem list.

## 2013-10-14 NOTE — Assessment & Plan Note (Signed)
BP looks good  

## 2013-10-23 ENCOUNTER — Ambulatory Visit (HOSPITAL_COMMUNITY)
Admission: RE | Admit: 2013-10-23 | Discharge: 2013-10-23 | Disposition: A | Discharge status: Home / Self Care | Payer: PRIVATE HEALTH INSURANCE | Source: Ambulatory Visit | Attending: Family Medicine | Admitting: Family Medicine

## 2013-10-23 DIAGNOSIS — Z1231 Encounter for screening mammogram for malignant neoplasm of breast: Secondary | ICD-10-CM | POA: Insufficient documentation

## 2013-11-21 ENCOUNTER — Other Ambulatory Visit: Payer: Self-pay | Admitting: Family Medicine

## 2013-11-24 ENCOUNTER — Other Ambulatory Visit: Payer: Self-pay | Admitting: Family Medicine

## 2014-01-16 ENCOUNTER — Encounter: Payer: Self-pay | Admitting: Family Medicine

## 2014-01-16 ENCOUNTER — Ambulatory Visit (INDEPENDENT_AMBULATORY_CARE_PROVIDER_SITE_OTHER): Payer: PRIVATE HEALTH INSURANCE | Admitting: Family Medicine

## 2014-01-16 VITALS — BP 112/74 | HR 85 | Temp 98.3°F | Wt 184.0 lb

## 2014-01-16 DIAGNOSIS — E118 Type 2 diabetes mellitus with unspecified complications: Principal | ICD-10-CM

## 2014-01-16 DIAGNOSIS — I10 Essential (primary) hypertension: Secondary | ICD-10-CM

## 2014-01-16 DIAGNOSIS — E1165 Type 2 diabetes mellitus with hyperglycemia: Secondary | ICD-10-CM

## 2014-01-16 DIAGNOSIS — E119 Type 2 diabetes mellitus without complications: Secondary | ICD-10-CM

## 2014-01-16 DIAGNOSIS — E785 Hyperlipidemia, unspecified: Secondary | ICD-10-CM

## 2014-01-16 DIAGNOSIS — Z0000 Encounter for general adult medical examination without abnormal findings: Secondary | ICD-10-CM

## 2014-01-16 DIAGNOSIS — IMO0002 Reserved for concepts with insufficient information to code with codable children: Secondary | ICD-10-CM

## 2014-01-16 LAB — POCT GLYCOSYLATED HEMOGLOBIN (HGB A1C): Hemoglobin A1C: 6.5

## 2014-01-16 NOTE — Assessment & Plan Note (Signed)
Continue Lovastatin. RTC in 3 month for FLP.

## 2014-01-16 NOTE — Assessment & Plan Note (Signed)
Flu shot given today. Depression screening completed and was negative.

## 2014-01-16 NOTE — Assessment & Plan Note (Signed)
Good control. I refilled her Hyzaar today.

## 2014-01-16 NOTE — Assessment & Plan Note (Signed)
A1C checked today is 6.5 Seem to be doing very well on current regimen. Foot exam completed today and was normal. Eye exam scheduled with ophthalmologist. F/U in 3 months for reassessment.

## 2014-01-16 NOTE — Patient Instructions (Addendum)
It was nice seeing you today and I am glad your DM is now well controlled, congratulation. I will like to see you in 3 months, please come fasting so we can check your cholesterol.   Diabetes and Foot Care Diabetes may cause you to have problems because of poor blood supply (circulation) to your feet and legs. This may cause the skin on your feet to become thinner, break easier, and heal more slowly. Your skin may become dry, and the skin may peel and crack. You may also have nerve damage in your legs and feet causing decreased feeling in them. You may not notice minor injuries to your feet that could lead to infections or more serious problems. Taking care of your feet is one of the most important things you can do for yourself.  HOME CARE INSTRUCTIONS  Wear shoes at all times, even in the house. Do not go barefoot. Bare feet are easily injured.  Check your feet daily for blisters, cuts, and redness. If you cannot see the bottom of your feet, use a mirror or ask someone for help.  Wash your feet with warm water (do not use hot water) and mild soap. Then pat your feet and the areas between your toes until they are completely dry. Do not soak your feet as this can dry your skin.  Apply a moisturizing lotion or petroleum jelly (that does not contain alcohol and is unscented) to the skin on your feet and to dry, brittle toenails. Do not apply lotion between your toes.  Trim your toenails straight across. Do not dig under them or around the cuticle. File the edges of your nails with an emery board or nail file.  Do not cut corns or calluses or try to remove them with medicine.  Wear clean socks or stockings every day. Make sure they are not too tight. Do not wear knee-high stockings since they may decrease blood flow to your legs.  Wear shoes that fit properly and have enough cushioning. To break in new shoes, wear them for just a few hours a day. This prevents you from injuring your feet. Always  look in your shoes before you put them on to be sure there are no objects inside.  Do not cross your legs. This may decrease the blood flow to your feet.  If you find a minor scrape, cut, or break in the skin on your feet, keep it and the skin around it clean and dry. These areas may be cleansed with mild soap and water. Do not cleanse the area with peroxide, alcohol, or iodine.  When you remove an adhesive bandage, be sure not to damage the skin around it.  If you have a wound, look at it several times a day to make sure it is healing.  Do not use heating pads or hot water bottles. They may burn your skin. If you have lost feeling in your feet or legs, you may not know it is happening until it is too late.  Make sure your health care provider performs a complete foot exam at least annually or more often if you have foot problems. Report any cuts, sores, or bruises to your health care provider immediately. SEEK MEDICAL CARE IF:   You have an injury that is not healing.  You have cuts or breaks in the skin.  You have an ingrown nail.  You notice redness on your legs or feet.  You feel burning or tingling in your legs  or feet.  You have pain or cramps in your legs and feet.  Your legs or feet are numb.  Your feet always feel cold. SEEK IMMEDIATE MEDICAL CARE IF:   There is increasing redness, swelling, or pain in or around a wound.  There is a red line that goes up your leg.  Pus is coming from a wound.  You develop a fever or as directed by your health care provider.  You notice a bad smell coming from an ulcer or wound. Document Released: 04/28/2000 Document Revised: 01/01/2013 Document Reviewed: 10/08/2012 [facility] Patient Information 2015 [place], [place]. This information is not intended to replace advice given to you by your health care provider. Make sure you discuss any questions you have with your health care provider.

## 2014-01-16 NOTE — Progress Notes (Signed)
Subjective:     Patient ID: Kathy Duncan, female   DOB: 14-May-1956, 58 y.o.   MRN: [id number]  HPI DM2: Here for follow up, she has been doing well at home, she is compliant with Lantus 35 unit daily and Metformin 1000mg  BID, her CBG ranges from 84 morning to 84/100 evening,no hypoglycemic episode.has appointment scheduled with her eye doctor for Sept 15th. HTN/HDL:Compliant with all her medications, she will like to start getting her Hyzaar filled by me. On Lovastatin for cholesterol. HM: here for health maintenance.  Current Outpatient Prescriptions on File Prior to Visit  Medication Sig Dispense Refill  . aspirin EC 81 MG EC tablet Take 1 tablet (81 mg total) by mouth daily.  150 tablet  2  . ferrous sulfate 325 (65 FE) MG tablet Take 1 tablet (325 mg total) by mouth daily with breakfast.  30 tablet  5  . insulin glargine (LANTUS) 100 UNIT/ML injection Inject 0.35 mLs (35 Units total) into the skin daily.  10 mL  3  . losartan-hydrochlorothiazide (HYZAAR) 100-25 MG per tablet Take 1 tablet by mouth daily.  30 tablet  11  . lovastatin (MEVACOR) 40 MG tablet TAKE 1 TABLET AT BEDTIME  30 tablet  5  . metFORMIN (GLUCOPHAGE) 1000 MG tablet TAKE 1 TABLET (1,000 MG TOTAL) BY MOUTH 2 (TWO) TIMES DAILY WITH A MEAL.  60 tablet  5  . tamoxifen (NOLVADEX) 20 MG tablet TAKE 1 TABLET BY MOUTH EVERY DAY  90 tablet  12  . Vitamin D, Ergocalciferol, (DRISDOL) 50000 UNITS CAPS capsule Take 1 capsule (50,000 Units total) by mouth every 7 (seven) days.  30 capsule  2  . fluticasone (FLONASE) 50 MCG/ACT nasal spray Place 2 sprays into the nose daily.  16 g  6  . nystatin-triamcinolone ointment (MYCOLOG) Apply 1 application topically 2 (two) times daily.  30 g  1   No current facility-administered medications on file prior to visit.   Past Medical History  Diagnosis Date  . Hypertension   . Diabetes mellitus   . Arthritis       Review of Systems  Respiratory: Negative.   Cardiovascular:  Negative.   Gastrointestinal: Negative.   Genitourinary: Negative.   All other systems reviewed and are negative.      Filed Vitals:   01/16/14 1452  BP: 112/74  Pulse: 85  Temp: 98.3 F (36.8 C)  TempSrc: Oral  Weight: 184 lb (83.462 kg)    Objective:   Physical Exam  Nursing note and vitals reviewed. Constitutional: She is oriented to person, place, and time. She appears well-developed. No distress.  Cardiovascular: Normal rate, regular rhythm, normal heart sounds and intact distal pulses.   No murmur heard. Pulmonary/Chest: Effort normal and breath sounds normal. No respiratory distress. She has no wheezes.  Abdominal: Soft. Bowel sounds are normal. She exhibits no distension and no mass. There is no tenderness.  Musculoskeletal:  Sensory exam of the foot is normal, tested with the monofilament. Good pulses, no lesions or ulcers, good peripheral pulses.   Neurological: She is alert and oriented to person, place, and time.       Assessment:     DM2 HTN HLD Health maintenance     Plan:     Check problem list.

## 2014-03-18 ENCOUNTER — Other Ambulatory Visit: Payer: Self-pay | Admitting: Family Medicine

## 2014-03-27 ENCOUNTER — Other Ambulatory Visit: Payer: Self-pay | Admitting: Family Medicine

## 2014-04-03 ENCOUNTER — Telehealth: Payer: Self-pay | Admitting: Hematology

## 2014-04-03 NOTE — Telephone Encounter (Signed)
, °

## 2014-04-06 ENCOUNTER — Other Ambulatory Visit: Payer: Self-pay | Admitting: Family Medicine

## 2014-04-21 ENCOUNTER — Telehealth: Payer: Self-pay | Admitting: *Deleted

## 2014-04-21 NOTE — Telephone Encounter (Signed)
LMOVM for pt to return call .Fleeger, Jessica Dawn  

## 2014-04-21 NOTE — Telephone Encounter (Signed)
-----   Message from Andrena Mews, MD sent at 04/21/2014 11:52 AM EST ----- Please call to alert patient, I got her test result from outside lab. Vit D is slightly low, ensure she continues to take her Vit D supplement.

## 2014-04-28 ENCOUNTER — Ambulatory Visit (INDEPENDENT_AMBULATORY_CARE_PROVIDER_SITE_OTHER): Payer: Commercial Managed Care - PPO | Admitting: Family Medicine

## 2014-04-28 ENCOUNTER — Encounter: Payer: Self-pay | Admitting: Family Medicine

## 2014-04-28 VITALS — BP 137/79 | HR 81 | Temp 98.3°F | Wt 192.0 lb

## 2014-04-28 DIAGNOSIS — E559 Vitamin D deficiency, unspecified: Secondary | ICD-10-CM

## 2014-04-28 DIAGNOSIS — E119 Type 2 diabetes mellitus without complications: Secondary | ICD-10-CM

## 2014-04-28 DIAGNOSIS — I10 Essential (primary) hypertension: Secondary | ICD-10-CM

## 2014-04-28 DIAGNOSIS — E785 Hyperlipidemia, unspecified: Secondary | ICD-10-CM

## 2014-04-28 LAB — POCT GLYCOSYLATED HEMOGLOBIN (HGB A1C): Hemoglobin A1C: 6.6

## 2014-04-28 NOTE — Assessment & Plan Note (Signed)
A1C looks good. Recently had Bmet done from outside lab which was good. Lab reviewed and discussed with her. Continue current DM regimen.

## 2014-04-28 NOTE — Assessment & Plan Note (Signed)
She had FLP done in Oct from an outside lab  Labs looks good. Continue Lovastatin.

## 2014-04-28 NOTE — Progress Notes (Signed)
Subjective:     Patient ID: Kathy Duncan, female   DOB: July 18, 1955, 58 y.o.   MRN: [id number]  HPI  DMII:Compliant with Lantus 35 unit and metformin 1000mg  BID, here for follow up. HTN/HLD:Currently compliant with Hyzaar 100/25 qd for her BP and Lovastatin for her cholesterol. Denies any concern, here for follow up. Vit D Def: Here for follow up, had not taken her Vit D consistently.  Current Outpatient Prescriptions on File Prior to Visit  Medication Sig Dispense Refill  . aspirin EC 81 MG EC tablet Take 1 tablet (81 mg total) by mouth daily. 150 tablet 2  . ferrous sulfate 325 (65 FE) MG tablet Take 1 tablet (325 mg total) by mouth daily with breakfast. 30 tablet 5  . LANTUS 100 UNIT/ML injection INJECT 0.35 MLS (35 UNITS TOTAL) INTO THE SKIN DAILY. 10 mL 3  . losartan-hydrochlorothiazide (HYZAAR) 100-25 MG per tablet Take 1 tablet by mouth daily. 30 tablet 11  . lovastatin (MEVACOR) 40 MG tablet TAKE 1 TABLET AT BEDTIME 30 tablet 5  . metFORMIN (GLUCOPHAGE) 1000 MG tablet TAKE 1 TABLET (1,000 MG TOTAL) BY MOUTH 2 (TWO) TIMES DAILY WITH A MEAL. 60 tablet 5  . nystatin-triamcinolone ointment (MYCOLOG) Apply 1 application topically 2 (two) times daily. 30 g 1  . tamoxifen (NOLVADEX) 20 MG tablet TAKE 1 TABLET BY MOUTH EVERY DAY 90 tablet 12  . Vitamin D, Ergocalciferol, (DRISDOL) 50000 UNITS CAPS capsule Take 1 capsule (50,000 Units total) by mouth every 7 (seven) days. 30 capsule 2  . fluticasone (FLONASE) 50 MCG/ACT nasal spray Place 2 sprays into the nose daily. 16 g 6   No current facility-administered medications on file prior to visit.   Past Medical History  Diagnosis Date  . Hypertension   . Diabetes mellitus   . Arthritis      Review of Systems  Respiratory: Negative.   Cardiovascular: Negative.   Gastrointestinal: Negative.   Genitourinary: Negative.   All other systems reviewed and are negative.  Filed Vitals:   04/28/14 1011  BP: 137/79  Pulse: 81  Temp:  98.3 F (36.8 C)  TempSrc: Oral  Weight: 192 lb (87.091 kg)       Objective:   Physical Exam  Constitutional: She appears well-developed. No distress.  Cardiovascular: Normal rate, regular rhythm, normal heart sounds and intact distal pulses.   No murmur heard. Pulmonary/Chest: Effort normal and breath sounds normal. No respiratory distress. She has no wheezes.  Abdominal: Soft. Bowel sounds are normal. She exhibits no distension and no mass. There is no tenderness.  Musculoskeletal: Normal range of motion. She exhibits no edema.  Psychiatric: She has a normal mood and affect. Her behavior is normal.  Nursing note and vitals reviewed.      Assessment:     DMII: HTN: HLD Vit D Def:    Plan:     Check problem list.

## 2014-04-28 NOTE — Patient Instructions (Signed)
Your A1C looks good today, continue current diabetes regimen, I will see you back in 4 months.

## 2014-04-28 NOTE — Assessment & Plan Note (Signed)
Slightly low Vit D with recent lab. I recommended daily use of her vitamin. She agreed with plan.

## 2014-04-28 NOTE — Assessment & Plan Note (Signed)
BP looks good, continue current regimen. 

## 2014-04-29 ENCOUNTER — Other Ambulatory Visit: Payer: Self-pay | Admitting: *Deleted

## 2014-04-29 DIAGNOSIS — N6091 Unspecified benign mammary dysplasia of right breast: Secondary | ICD-10-CM

## 2014-04-30 ENCOUNTER — Other Ambulatory Visit: Payer: PRIVATE HEALTH INSURANCE

## 2014-04-30 ENCOUNTER — Ambulatory Visit (HOSPITAL_BASED_OUTPATIENT_CLINIC_OR_DEPARTMENT_OTHER): Payer: Commercial Managed Care - PPO | Admitting: Hematology

## 2014-04-30 ENCOUNTER — Telehealth: Payer: Self-pay | Admitting: Hematology

## 2014-04-30 ENCOUNTER — Other Ambulatory Visit (HOSPITAL_BASED_OUTPATIENT_CLINIC_OR_DEPARTMENT_OTHER): Payer: Commercial Managed Care - PPO

## 2014-04-30 ENCOUNTER — Encounter: Payer: Self-pay | Admitting: Hematology

## 2014-04-30 ENCOUNTER — Ambulatory Visit: Payer: PRIVATE HEALTH INSURANCE | Admitting: Adult Health

## 2014-04-30 VITALS — BP 133/72 | HR 78 | Temp 98.3°F | Resp 19 | Ht 67.0 in | Wt 196.9 lb

## 2014-04-30 DIAGNOSIS — N6091 Unspecified benign mammary dysplasia of right breast: Secondary | ICD-10-CM

## 2014-04-30 DIAGNOSIS — N6099 Unspecified benign mammary dysplasia of unspecified breast: Secondary | ICD-10-CM

## 2014-04-30 DIAGNOSIS — I10 Essential (primary) hypertension: Secondary | ICD-10-CM

## 2014-04-30 DIAGNOSIS — E119 Type 2 diabetes mellitus without complications: Secondary | ICD-10-CM

## 2014-04-30 LAB — CBC WITH DIFFERENTIAL/PLATELET
BASO%: 1 % (ref 0.0–2.0)
Basophils Absolute: 0.1 10*3/uL (ref 0.0–0.1)
EOS%: 4.4 % (ref 0.0–7.0)
Eosinophils Absolute: 0.2 10*3/uL (ref 0.0–0.5)
HCT: 37 % (ref 34.8–46.6)
HGB: 11.7 g/dL (ref 11.6–15.9)
LYMPH%: 42 % (ref 14.0–49.7)
MCH: 25 pg — ABNORMAL LOW (ref 25.1–34.0)
MCHC: 31.7 g/dL (ref 31.5–36.0)
MCV: 79.1 fL — ABNORMAL LOW (ref 79.5–101.0)
MONO#: 0.3 10*3/uL (ref 0.1–0.9)
MONO%: 6.3 % (ref 0.0–14.0)
NEUT#: 2.4 10*3/uL (ref 1.5–6.5)
NEUT%: 46.3 % (ref 38.4–76.8)
Platelets: 269 10*3/uL (ref 145–400)
RBC: 4.68 10*6/uL (ref 3.70–5.45)
RDW: 15.2 % — ABNORMAL HIGH (ref 11.2–14.5)
WBC: 5.1 10*3/uL (ref 3.9–10.3)
lymph#: 2.1 10*3/uL (ref 0.9–3.3)

## 2014-04-30 LAB — COMPREHENSIVE METABOLIC PANEL (CC13)
ALT: 13 U/L (ref 0–55)
AST: 16 U/L (ref 5–34)
Albumin: 3.5 g/dL (ref 3.5–5.0)
Alkaline Phosphatase: 59 U/L (ref 40–150)
Anion Gap: 10 mEq/L (ref 3–11)
BUN: 24.1 mg/dL (ref 7.0–26.0)
CO2: 26 mEq/L (ref 22–29)
Calcium: 9.6 mg/dL (ref 8.4–10.4)
Chloride: 103 mEq/L (ref 98–109)
Creatinine: 0.8 mg/dL (ref 0.6–1.1)
EGFR: >90 mL/min/{1.73_m2} (ref >90)
Glucose: 109 mg/dl (ref 70–140)
Potassium: 4 mEq/L (ref 3.5–5.1)
Sodium: 139 mEq/L (ref 136–145)
Total Bilirubin: <0.2 mg/dL (ref 0.20–1.20)
Total Protein: 7.2 g/dL (ref 6.4–8.3)

## 2014-04-30 MED ORDER — TAMOXIFEN CITRATE 20 MG PO TABS: ORAL_TABLET | ORAL | Status: DC

## 2014-04-30 NOTE — Telephone Encounter (Signed)
Gave avs & cal for Dec 2016. °

## 2014-04-30 NOTE — Progress Notes (Signed)
[facility] Follow-up Visit  Name: Kathy Duncan            Date: 04/30/2014 MRN: [id number]                DOB: 03-Jan-1956   REASON FOR VISIT: History atypical ductal hyperplasia, high risk breast cancer  HISTORY OF PRESENT ILLNESS: Kathy Duncan returns for followup. She remains on Tamoxifen daily, tolerates it very well without any side effects.  She denies any symptoms, has good energy and appetite, weight is stale.   CURRENT BREAST MEDICATIONS: Tamoxifen 20 mg daily. Began 01/2010.   ALLERGIES: Ace inhibitors  CHANGE IN FAMILY HISTORY:  None.  Health Maintenance Mammogram: 10/2013 Colonoscopy:2006 Bone Density Scan: n/a Pap Smear: s/p TAH/BSO Eye Exam: 12/2011 Vitamin D Level: n/a Lipid Panel: 11/2011   REVIEW OF SYSTEMS:  A 10 point review of systems was conducted and is otherwise negative except for what is noted above.    PHYSICAL EXAM: BP 133/72 mmHg  Pulse 78  Temp(Src) 98.3 F (36.8 C) (Oral)  Resp 19  Ht 5\' 7"  (1.702 m)  Wt 196 lb 14.4 oz (89.313 kg)  BMI 30.83 kg/m2  SpO2 100% GENERAL: Well developed, well nourished, in no acute distress.  EENT: No ocular or oral lesions. No stomatitis.  RESPIRATORY: Lungs are clear to auscultation bilaterally with normal respiratory movement and no accessory muscle use. CARDIAC: No murmur, rub or tachycardia. No upper or lower extremity edema.  GI: Abdomen is soft, no palpable hepatosplenomegaly. No fluid wave. No tenderness. Musculoskeletal: No kyphosis, no tenderness over the spine, ribs or hips. Lymph: No cervical, infraclavicular, axillary or inguinal adenopathy. Neuro: No focal neurological deficits. Psych: Alert and oriented X 3, appropriate mood and affect.  BREAST EXAM: In the supine position, with the right arm over the head, right nipple is everted. No periareolar edema or nipple discharge. No mass in any quadrant or subareolar region. Remote lumpectomy incision, 11  o'clock position. 6 o'clock position, ovoid scar, 11 mm. No redness of the skin. No right axillary adenopathy. With the left arm over the head, left nipple is everted. No periareolar edema or nipple discharge. No mass in any quadrant or subareolar region. No redness of the skin. There is a 1.5cm node at right axilla, smooth, movable, nontender. No left axillary adenopathy.    ASSESSMENT AND PLAN: 58 y/o female with: 1. 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 No evidence of malignancy. -She is clinically doing well, reason to mammogram 6 months ago was normal. -A small mobile lymph nodes was noticed in her right axilla, per patient it has been existing for a couple years. Likely benign. However given her high risk of breast cancer, I will order ultrasound today for further evaluation. If there are any suspicion, we'll proceed with needle biopsy. -Continue tamoxifen 20 mg once daily and annual screening mammogram.  2. Hypertension and diabetes Follow-up with her primary care physician.  Follow-up: If her ultrasound is negative, I'll see her back in one year.  Kathy Duncan 04/30/2014

## 2014-05-04 ENCOUNTER — Telehealth: Payer: Self-pay | Admitting: Hematology

## 2014-05-04 ENCOUNTER — Other Ambulatory Visit: Payer: Self-pay | Admitting: Hematology

## 2014-05-04 DIAGNOSIS — N63 Unspecified lump in unspecified breast: Secondary | ICD-10-CM

## 2014-05-04 NOTE — Telephone Encounter (Signed)
LM to confirm appt for Mammo/US.

## 2014-05-06 ENCOUNTER — Other Ambulatory Visit: Payer: Self-pay | Admitting: *Deleted

## 2014-05-06 MED ORDER — LOSARTAN POTASSIUM-HCTZ 100-25 MG PO TABS: 1.0000 | ORAL_TABLET | Freq: Every day | ORAL | Status: DC

## 2014-05-06 NOTE — Telephone Encounter (Signed)
Patient calls and would like rx filled before we close for the holidays.

## 2014-05-28 ENCOUNTER — Other Ambulatory Visit: Payer: Self-pay | Admitting: Hematology

## 2014-05-28 ENCOUNTER — Other Ambulatory Visit: Payer: Self-pay

## 2014-05-28 DIAGNOSIS — N63 Unspecified lump in unspecified breast: Secondary | ICD-10-CM

## 2014-06-01 ENCOUNTER — Other Ambulatory Visit: Payer: Self-pay | Admitting: *Deleted

## 2014-06-01 ENCOUNTER — Ambulatory Visit
Admission: RE | Admit: 2014-06-01 | Discharge: 2014-06-01 | Disposition: A | Discharge status: Home / Self Care | Payer: Commercial Managed Care - PPO | Source: Ambulatory Visit | Attending: Hematology | Admitting: Hematology

## 2014-06-01 ENCOUNTER — Other Ambulatory Visit: Payer: Self-pay | Admitting: Hematology

## 2014-06-01 DIAGNOSIS — N6091 Unspecified benign mammary dysplasia of right breast: Secondary | ICD-10-CM

## 2014-06-01 DIAGNOSIS — N6099 Unspecified benign mammary dysplasia of unspecified breast: Secondary | ICD-10-CM

## 2014-06-01 DIAGNOSIS — N63 Unspecified lump in unspecified breast: Secondary | ICD-10-CM

## 2014-06-01 MED ORDER — TAMOXIFEN CITRATE 20 MG PO TABS: ORAL_TABLET | ORAL | Status: DC

## 2014-08-03 ENCOUNTER — Other Ambulatory Visit: Payer: Self-pay | Admitting: Family Medicine

## 2014-10-19 ENCOUNTER — Other Ambulatory Visit: Payer: Self-pay | Admitting: Hematology

## 2014-10-19 DIAGNOSIS — Z1231 Encounter for screening mammogram for malignant neoplasm of breast: Secondary | ICD-10-CM

## 2014-10-27 ENCOUNTER — Telehealth: Payer: Self-pay | Admitting: Family Medicine

## 2014-10-27 NOTE — Telephone Encounter (Signed)
Pt has an appt on 11-06-2014. Jazmin Hartsell,CMA

## 2014-10-27 NOTE — Telephone Encounter (Signed)
[facility] team, please call patient to let her know that I got her lab result from an outside lab which showed elevated A1C. Please have her come in to see me soon to discuss result. A1C 8.3 on 10/26/14

## 2014-11-05 ENCOUNTER — Ambulatory Visit (HOSPITAL_COMMUNITY)
Admission: RE | Admit: 2014-11-05 | Discharge: 2014-11-05 | Disposition: A | Discharge status: Home / Self Care | Payer: Commercial Managed Care - PPO | Source: Ambulatory Visit | Attending: Hematology | Admitting: Hematology

## 2014-11-05 DIAGNOSIS — Z1231 Encounter for screening mammogram for malignant neoplasm of breast: Secondary | ICD-10-CM | POA: Diagnosis present

## 2014-11-06 ENCOUNTER — Encounter: Payer: Self-pay | Admitting: Family Medicine

## 2014-11-06 ENCOUNTER — Ambulatory Visit (INDEPENDENT_AMBULATORY_CARE_PROVIDER_SITE_OTHER): Payer: Commercial Managed Care - PPO | Admitting: Family Medicine

## 2014-11-06 VITALS — BP 112/60 | HR 72 | Temp 98.6°F | Ht 67.0 in | Wt 193.0 lb

## 2014-11-06 DIAGNOSIS — E119 Type 2 diabetes mellitus without complications: Secondary | ICD-10-CM

## 2014-11-06 DIAGNOSIS — I10 Essential (primary) hypertension: Secondary | ICD-10-CM | POA: Diagnosis not present

## 2014-11-06 DIAGNOSIS — R21 Rash and other nonspecific skin eruption: Secondary | ICD-10-CM

## 2014-11-06 DIAGNOSIS — M67431 Ganglion, right wrist: Secondary | ICD-10-CM | POA: Diagnosis not present

## 2014-11-06 DIAGNOSIS — E785 Hyperlipidemia, unspecified: Secondary | ICD-10-CM

## 2014-11-06 MED ORDER — NYSTATIN-TRIAMCINOLONE 100000-0.1 UNIT/GM-% EX OINT: 1.0000 "application " | TOPICAL_OINTMENT | Freq: Two times a day (BID) | CUTANEOUS | Status: DC

## 2014-11-06 MED ORDER — METFORMIN HCL 1000 MG PO TABS: 1000.0000 mg | ORAL_TABLET | Freq: Two times a day (BID) | ORAL | Status: DC

## 2014-11-06 MED ORDER — INSULIN GLARGINE 100 UNIT/ML ~~LOC~~ SOLN: SUBCUTANEOUS | Status: DC

## 2014-11-06 MED ORDER — LOSARTAN POTASSIUM-HCTZ 100-25 MG PO TABS
1.0000 | ORAL_TABLET | Freq: Every day | ORAL | Status: DC
Start: 2014-11-06 — End: 2014-11-16

## 2014-11-06 MED ORDER — LOVASTATIN 40 MG PO TABS: 40.0000 mg | ORAL_TABLET | Freq: Every day | ORAL | Status: DC

## 2014-11-06 NOTE — Assessment & Plan Note (Signed)
Doing well. I refilled her Lovastatin.

## 2014-11-06 NOTE — Progress Notes (Signed)
Subjective:     Patient ID: Kathy Duncan, female   DOB: 17-Oct-1955, 59 y.o.   MRN: [id number]  HPI  DM2:Patient stated in the last few months prior to her A1C check two weeks ago she had been non-compliant with her Metformin and Lantus. At times she forgets to take them. Her diet has been poor but since she got her A1C result 2 wks ago, she had been doing better. Her CBG yesterday morning was 60, denies hypoglycemic episodes. When she was not using her Insulin her CBG was 190. HTN: Here for follow up and need medication refill. HLD: She is compliant here, for follow up and medication refill. Knot in the hand: right hand knot more than 1 year now worsening and painful. Here for follow up. Rash: C/O rash under her armpit B/L, rash is itchy and gradually spreading, she had similar rash in the past treated with Nystatin/Triamcinolone, she will like to get refill of meds.  Current Outpatient Prescriptions on File Prior to Visit  Medication Sig Dispense Refill  . aspirin EC 81 MG EC tablet Take 1 tablet (81 mg total) by mouth daily. 150 tablet 2  . ferrous sulfate 325 (65 FE) MG tablet Take 1 tablet (325 mg total) by mouth daily with breakfast. 30 tablet 5  . LANTUS 100 UNIT/ML injection INJECT 0.35 MLS (35 UNITS TOTAL) INTO THE SKIN DAILY. 10 mL 3  . losartan-hydrochlorothiazide (HYZAAR) 100-25 MG per tablet Take 1 tablet by mouth daily. 30 tablet 6  . lovastatin (MEVACOR) 40 MG tablet TAKE 1 TABLET AT BEDTIME 30 tablet 5  . metFORMIN (GLUCOPHAGE) 1000 MG tablet TAKE 1 TABLET (1,000 MG TOTAL) BY MOUTH 2 (TWO) TIMES DAILY WITH A MEAL. 60 tablet 5  . nystatin-triamcinolone ointment (MYCOLOG) Apply 1 application topically 2 (two) times daily. 30 g 1  . tamoxifen (NOLVADEX) 20 MG tablet TAKE 1 TABLET BY MOUTH EVERY DAY 90 tablet 2   No current facility-administered medications on file prior to visit.   Past Medical History  Diagnosis Date  . Hypertension   . Diabetes mellitus   . Arthritis        Review of Systems  Respiratory: Negative.   Cardiovascular: Negative.   Gastrointestinal: Negative.   Genitourinary: Negative.   All other systems reviewed and are negative.  Filed Vitals:   11/06/14 0839  BP: 112/60  Pulse: 72  Temp: 98.6 F (37 C)  TempSrc: Oral  Height: 5\' 7"  (1.702 m)  Weight: 193 lb (87.544 kg)       Objective:   Physical Exam  Constitutional: She appears well-developed. No distress.  Cardiovascular: Normal rate, regular rhythm, normal heart sounds and intact distal pulses.   No murmur heard. Pulmonary/Chest: Effort normal and breath sounds normal. No respiratory distress. She has no wheezes.  Abdominal: Soft. She exhibits no distension and no mass. There is no tenderness.  Musculoskeletal: Normal range of motion. She exhibits no edema.       Arms: Skin: Rash noted. Rash is macular.     Nursing note and vitals reviewed.      Assessment:     DM2: HTN: HLD: Knot in the hand: Rash:    Plan:     Check problem list.

## 2014-11-06 NOTE — Assessment & Plan Note (Signed)
A1C done 10/26/14 was 8.3. Patient had been non-compliant with her medications. Counseling done to continue Lantus 35 units and Metformin 1000 mg BID. She agreed to take her meds regularly. I will recheck her A1C in 3 months.

## 2014-11-06 NOTE — Assessment & Plan Note (Signed)
Ganglion of her right hand. Worsening and now symptomatic. Plan to refer to hand surg for removal. Referral order placed.

## 2014-11-06 NOTE — Assessment & Plan Note (Signed)
Non-specific rash. Acanthosis (not typical of) vs chronic irritation. Since she had done well on Nystatin plus triamcinolone this was refilled. Follow up soon if no improvement.

## 2014-11-06 NOTE — Assessment & Plan Note (Signed)
BP looks good today. Continue current regimen. I refilled her meds.

## 2014-11-06 NOTE — Patient Instructions (Signed)
It was nice seeing you today. I am sorry your A1C went up due to you not taking your medicine. Please be compliant with your medication, diet and exercise. I will see you back in 3 months for recheck.

## 2014-11-14 ENCOUNTER — Other Ambulatory Visit: Payer: Self-pay | Admitting: Family Medicine

## 2015-01-26 LAB — HM DIABETES EYE EXAM

## 2015-02-19 ENCOUNTER — Encounter: Payer: Self-pay | Admitting: Family Medicine

## 2015-02-19 ENCOUNTER — Other Ambulatory Visit: Payer: Self-pay | Admitting: Family Medicine

## 2015-02-19 ENCOUNTER — Ambulatory Visit (INDEPENDENT_AMBULATORY_CARE_PROVIDER_SITE_OTHER): Payer: Commercial Managed Care - PPO | Admitting: Family Medicine

## 2015-02-19 VITALS — BP 129/75 | HR 65 | Temp 98.3°F | Ht 67.0 in | Wt 196.2 lb

## 2015-02-19 DIAGNOSIS — Z0000 Encounter for general adult medical examination without abnormal findings: Secondary | ICD-10-CM | POA: Diagnosis not present

## 2015-02-19 DIAGNOSIS — R252 Cramp and spasm: Secondary | ICD-10-CM | POA: Insufficient documentation

## 2015-02-19 DIAGNOSIS — E119 Type 2 diabetes mellitus without complications: Secondary | ICD-10-CM

## 2015-02-19 DIAGNOSIS — I10 Essential (primary) hypertension: Secondary | ICD-10-CM | POA: Diagnosis not present

## 2015-02-19 DIAGNOSIS — Z23 Encounter for immunization: Secondary | ICD-10-CM

## 2015-02-19 LAB — POCT GLYCOSYLATED HEMOGLOBIN (HGB A1C): Hemoglobin A1C: 7.4

## 2015-02-19 MED ORDER — INSULIN GLARGINE 100 UNIT/ML ~~LOC~~ SOLN: SUBCUTANEOUS | Status: DC

## 2015-02-19 MED ORDER — LOSARTAN POTASSIUM-HCTZ 100-25 MG PO TABS: 1.0000 | ORAL_TABLET | Freq: Every day | ORAL | Status: DC

## 2015-02-19 MED ORDER — METFORMIN HCL 1000 MG PO TABS: 1000.0000 mg | ORAL_TABLET | Freq: Two times a day (BID) | ORAL | Status: DC

## 2015-02-19 MED ORDER — LOVASTATIN 40 MG PO TABS: 40.0000 mg | ORAL_TABLET | Freq: Every day | ORAL | Status: DC

## 2015-02-19 NOTE — Assessment & Plan Note (Addendum)
Improved control. Recent A1C done from an outside lab reviewed today and it was 8.3 Repeat A1C done today was 7.4. Patient commended on being compliant with medication and diet. For now we will continue the same. Medication refilled today. Urine microalbumin checked.

## 2015-02-19 NOTE — Assessment & Plan Note (Signed)
BP looks good. Continue same regimen. I refilled her medication today.

## 2015-02-19 NOTE — Assessment & Plan Note (Signed)
Flu hot given today. HIV and Hep C antibody screening done today.

## 2015-02-19 NOTE — Progress Notes (Addendum)
Subjective:     Patient ID: Kathy Duncan, female   DOB: Aug 27, 1955, 59 y.o.   MRN: [id number]  HPI DM2/HTN:Here for followup, her home BP check has been fine, her CBG ranges around 80s, she denies hypoglycemic symptoms. HM: Need vaccine update and health maintenance review. Muscle cramp: C/O cramping of her calf muscle on and off when ever she is lying in bed. Denies any calf muscle pain at the moment.   Current Outpatient Prescriptions on File Prior to Visit  Medication Sig Dispense Refill  . aspirin EC 81 MG EC tablet Take 1 tablet (81 mg total) by mouth daily. 150 tablet 2  . ferrous sulfate 325 (65 FE) MG tablet Take 1 tablet (325 mg total) by mouth daily with breakfast. 30 tablet 5  . insulin glargine (LANTUS) 100 UNIT/ML injection INJECT 0.35 MLS (35 UNITS TOTAL) INTO THE SKIN DAILY. 10 mL 3  . losartan-hydrochlorothiazide (HYZAAR) 100-25 MG per tablet TAKE 1 TABLET BY MOUTH DAILY. 90 tablet 2  . lovastatin (MEVACOR) 40 MG tablet Take 1 tablet (40 mg total) by mouth at bedtime. 90 tablet 2  . metFORMIN (GLUCOPHAGE) 1000 MG tablet Take 1 tablet (1,000 mg total) by mouth 2 (two) times daily with a meal. 180 tablet 2  . nystatin-triamcinolone ointment (MYCOLOG) Apply 1 application topically 2 (two) times daily. 30 g 1  . tamoxifen (NOLVADEX) 20 MG tablet TAKE 1 TABLET BY MOUTH EVERY DAY 90 tablet 2   No current facility-administered medications on file prior to visit.   Past Medical History  Diagnosis Date  . Hypertension   . Diabetes mellitus   . Arthritis      Review of Systems  Respiratory: Negative.   Cardiovascular: Negative.   Gastrointestinal: Negative.   Genitourinary: Negative.   All other systems reviewed and are negative.  Filed Vitals:   02/19/15 0845  BP: 129/75  Pulse: 65  Temp: 98.3 F (36.8 C)  TempSrc: Oral  Height: 5\' 7"  (1.702 m)  Weight: 196 lb 3 oz (88.99 kg)        Objective:   Physical Exam  Constitutional: She is oriented to person,  place, and time. She appears well-developed. No distress.  Cardiovascular: Normal rate, regular rhythm and normal heart sounds.   No murmur heard. Pulmonary/Chest: Effort normal and breath sounds normal. No respiratory distress. She has no wheezes.  Abdominal: Soft. Bowel sounds are normal. She exhibits no distension and no mass. There is no tenderness.  Musculoskeletal: Normal range of motion. She exhibits no edema.  Neurological: She is oriented to person, place, and time.  Nursing note and vitals reviewed.      Assessment:     DM2: HTN  Health maintenance Muscle cramp    Plan:     Check problem list

## 2015-02-19 NOTE — Patient Instructions (Signed)
Diabetes Mellitus and Food It is important for you to manage your blood sugar (glucose) level. Your blood glucose level can be greatly affected by what you eat. Eating healthier foods in the appropriate amounts throughout the day at about the same time each day will help you control your blood glucose level. It can also help slow or prevent worsening of your diabetes mellitus. Healthy eating may even help you improve the level of your blood pressure and reach or maintain a healthy weight.  General recommendations for healthful eating and cooking habits include:  Eating meals and snacks regularly. Avoid going long periods of time without eating to lose weight.  Eating a diet that consists mainly of plant-based foods, such as fruits, vegetables, nuts, legumes, and whole grains.  Using low-heat cooking methods, such as baking, instead of high-heat cooking methods, such as deep frying. Work with your dietitian to make sure you understand how to use the Nutrition Facts information on food labels. HOW CAN FOOD AFFECT ME? Carbohydrates Carbohydrates affect your blood glucose level more than any other type of food. Your dietitian will help you determine how many carbohydrates to eat at each meal and teach you how to count carbohydrates. Counting carbohydrates is important to keep your blood glucose at a healthy level, especially if you are using insulin or taking certain medicines for diabetes mellitus. Alcohol Alcohol can cause sudden decreases in blood glucose (hypoglycemia), especially if you use insulin or take certain medicines for diabetes mellitus. Hypoglycemia can be a life-threatening condition. Symptoms of hypoglycemia (sleepiness, dizziness, and disorientation) are similar to symptoms of having too much alcohol.  If your health care provider has given you approval to drink alcohol, do so in moderation and use the following guidelines:  Women should not have more than one drink per day, and men  should not have more than two drinks per day. One drink is equal to:  12 oz of beer.  5 oz of wine.  1 oz of hard liquor.  Do not drink on an empty stomach.  Keep yourself hydrated. Have water, diet soda, or unsweetened iced tea.  Regular soda, juice, and other mixers might contain a lot of carbohydrates and should be counted. WHAT FOODS ARE NOT RECOMMENDED? As you make food choices, it is important to remember that all foods are not the same. Some foods have fewer nutrients per serving than other foods, even though they might have the same number of calories or carbohydrates. It is difficult to get your body what it needs when you eat foods with fewer nutrients. Examples of foods that you should avoid that are high in calories and carbohydrates but low in nutrients include:  Trans fats (most processed foods list trans fats on the Nutrition Facts label).  Regular soda.  Juice.  Candy.  Sweets, such as cake, pie, doughnuts, and cookies.  Fried foods. WHAT FOODS CAN I EAT? Eat nutrient-rich foods, which will nourish your body and keep you healthy. The food you should eat also will depend on several factors, including:  The calories you need.  The medicines you take.  Your weight.  Your blood glucose level.  Your blood pressure level.  Your cholesterol level. You should eat a variety of foods, including:  Protein.  Lean cuts of meat.  Proteins low in saturated fats, such as fish, egg whites, and beans. Avoid processed meats.  Fruits and vegetables.  Fruits and vegetables that may help control blood glucose levels, such as apples, mangoes, and   yams.  Dairy products.  Choose fat-free or low-fat dairy products, such as milk, yogurt, and cheese.  Grains, bread, pasta, and rice.  Choose whole grain products, such as multigrain bread, whole oats, and brown rice. These foods may help control blood pressure.  Fats.  Foods containing healthful fats, such as nuts,  avocado, olive oil, canola oil, and fish. DOES EVERYONE WITH DIABETES MELLITUS HAVE THE SAME MEAL PLAN? Because every person with diabetes mellitus is different, there is not one meal plan that works for everyone. It is very important that you meet with a dietitian who will help you create a meal plan that is just right for you.   This information is not intended to replace advice given to you by your health care provider. Make sure you discuss any questions you have with your health care provider.   Document Released: 01/26/2005 Document Revised: 05/22/2014 Document Reviewed: 03/28/2013 Elsevier Interactive Patient Education 2016 [employer]  

## 2015-02-19 NOTE — Assessment & Plan Note (Signed)
Etiology unclear. May be due to dehydration although she claimed good hydration vs related to endocrine disorder ( e.g DM). Plan to treat conservatively for now. Hydration encouraged. Muscle massage and stretching exercise recommended. F/U soon for further evaluation if no improvement. She agreed with plan.

## 2015-02-20 LAB — HEPATITIS C ANTIBODY: HCV Ab: NEGATIVE

## 2015-02-20 LAB — MICROALBUMIN / CREATININE URINE RATIO
Creatinine, Urine: 98.1 mg/dL
Microalb Creat Ratio: 4.1 mg/g (ref 0.0–30.0)
Microalb, Ur: 0.4 mg/dL (ref <2.0)

## 2015-02-20 LAB — HIV ANTIBODY (ROUTINE TESTING W REFLEX): HIV 1&2 Ab, 4th Generation: NONREACTIVE

## 2015-02-22 ENCOUNTER — Other Ambulatory Visit: Payer: Self-pay | Admitting: Family Medicine

## 2015-02-22 ENCOUNTER — Encounter: Payer: Self-pay | Admitting: Family Medicine

## 2015-02-22 ENCOUNTER — Telehealth: Payer: Self-pay | Admitting: Family Medicine

## 2015-02-22 NOTE — Telephone Encounter (Signed)
Normal test result. Call back if needing more information.  I will also mail result home.

## 2015-02-27 ENCOUNTER — Other Ambulatory Visit: Payer: Self-pay | Admitting: Family Medicine

## 2015-03-23 ENCOUNTER — Encounter: Payer: Self-pay | Admitting: Family Medicine

## 2015-04-29 ENCOUNTER — Other Ambulatory Visit (HOSPITAL_BASED_OUTPATIENT_CLINIC_OR_DEPARTMENT_OTHER): Payer: Commercial Managed Care - PPO

## 2015-04-29 ENCOUNTER — Encounter: Payer: Self-pay | Admitting: Hematology

## 2015-04-29 ENCOUNTER — Telehealth: Payer: Self-pay | Admitting: Hematology

## 2015-04-29 ENCOUNTER — Ambulatory Visit (HOSPITAL_BASED_OUTPATIENT_CLINIC_OR_DEPARTMENT_OTHER): Payer: Commercial Managed Care - PPO

## 2015-04-29 ENCOUNTER — Ambulatory Visit (HOSPITAL_BASED_OUTPATIENT_CLINIC_OR_DEPARTMENT_OTHER): Payer: Commercial Managed Care - PPO | Admitting: Hematology

## 2015-04-29 VITALS — BP 146/89 | HR 79 | Temp 98.2°F | Resp 18 | Ht 67.0 in | Wt 198.2 lb

## 2015-04-29 DIAGNOSIS — Z87898 Personal history of other specified conditions: Secondary | ICD-10-CM

## 2015-04-29 DIAGNOSIS — N6091 Unspecified benign mammary dysplasia of right breast: Secondary | ICD-10-CM

## 2015-04-29 DIAGNOSIS — D649 Anemia, unspecified: Secondary | ICD-10-CM

## 2015-04-29 LAB — CBC WITH DIFFERENTIAL/PLATELET
BASO%: 0.3 % (ref 0.0–2.0)
Basophils Absolute: 0 10*3/uL (ref 0.0–0.1)
EOS%: 9.4 % — ABNORMAL HIGH (ref 0.0–7.0)
Eosinophils Absolute: 0.3 10*3/uL (ref 0.0–0.5)
HCT: 34.9 % (ref 34.8–46.6)
HGB: 11 g/dL — ABNORMAL LOW (ref 11.6–15.9)
LYMPH%: 35.8 % (ref 14.0–49.7)
MCH: 25.5 pg (ref 25.1–34.0)
MCHC: 31.5 g/dL (ref 31.5–36.0)
MCV: 81 fL (ref 79.5–101.0)
MONO#: 0.2 10*3/uL (ref 0.1–0.9)
MONO%: 6.1 % (ref 0.0–14.0)
NEUT#: 1.8 10*3/uL (ref 1.5–6.5)
NEUT%: 48.4 % (ref 38.4–76.8)
Platelets: 235 10*3/uL (ref 145–400)
RBC: 4.31 10*6/uL (ref 3.70–5.45)
RDW: 14.2 % (ref 11.2–14.5)
WBC: 3.6 10*3/uL — ABNORMAL LOW (ref 3.9–10.3)
lymph#: 1.3 10*3/uL (ref 0.9–3.3)

## 2015-04-29 LAB — FERRITIN: Ferritin: 44 ng/ml (ref 9–269)

## 2015-04-29 LAB — COMPREHENSIVE METABOLIC PANEL
ALT: 12 U/L (ref 0–55)
AST: 14 U/L (ref 5–34)
Albumin: 3.4 g/dL — ABNORMAL LOW (ref 3.5–5.0)
Alkaline Phosphatase: 62 U/L (ref 40–150)
Anion Gap: 8 mEq/L (ref 3–11)
BUN: 15.4 mg/dL (ref 7.0–26.0)
CO2: 26 mEq/L (ref 22–29)
Calcium: 9.3 mg/dL (ref 8.4–10.4)
Chloride: 104 mEq/L (ref 98–109)
Creatinine: 0.9 mg/dL (ref 0.6–1.1)
EGFR: 79 mL/min/{1.73_m2} — ABNORMAL LOW (ref >90)
Glucose: 245 mg/dl — ABNORMAL HIGH (ref 70–140)
Potassium: 4.1 mEq/L (ref 3.5–5.1)
Sodium: 139 mEq/L (ref 136–145)
Total Bilirubin: 0.3 mg/dL (ref 0.20–1.20)
Total Protein: 7.1 g/dL (ref 6.4–8.3)

## 2015-04-29 LAB — IRON AND TIBC
%SAT: 18 % — ABNORMAL LOW (ref 21–57)
Iron: 58 ug/dL (ref 41–142)
TIBC: 314 ug/dL (ref 236–444)
UIBC: 256 ug/dL (ref 120–384)

## 2015-04-29 LAB — RETICULOCYTES (CHCC)
Immature Retic Fract: 9.9 % (ref 1.60–10.00)
RBC: 4.4 10*6/uL (ref 3.70–5.45)
Retic %: 1.56 % (ref 0.70–2.10)
Retic Ct Abs: 68.64 10*3/uL (ref 33.70–90.70)

## 2015-04-29 LAB — MORPHOLOGY: PLT EST: ADEQUATE

## 2015-04-29 NOTE — Progress Notes (Signed)
[facility]  [phone] [phone]  Clinic Follow up Note   Patient Care Team: Kinnie Feil, MD as PCP - General (Family Medicine) 04/29/2015  REASON FOR VISIT: History atypical ductal hyperplasia, high risk breast cancer  HISTORY OF PRESENT ILLNESS: Kathy Duncan returns for followup. She remains on Tamoxifen daily, tolerates it very well without any side effects. She denies any symptoms, has good energy and appetite, weight is stale.   CURRENT BREAST MEDICATIONS: Tamoxifen 20 mg daily. Began 01/2010.   INTERVAL HISTORY: Kathy Duncan returns for follow-up. She was last seen by me 1 year ago. She is doing very well, denies any significant pain, or other symptoms. She has good appetite and energy level, weight is stable. She has been compliant with tamoxifen, tolerating well, no significant side effects. She denies any ED visit for hospitalization in the past E.  REVIEW OF SYSTEMS:   Constitutional: Denies fevers, chills or abnormal weight loss Eyes: Denies blurriness of vision Ears, nose, mouth, throat, and face: Denies mucositis or sore throat Respiratory: Denies cough, dyspnea or wheezes Cardiovascular: Denies palpitation, chest discomfort or lower extremity swelling Gastrointestinal:  Denies nausea, heartburn or change in bowel habits Skin: Denies abnormal skin rashes Lymphatics: Denies new lymphadenopathy or easy bruising Neurological:Denies numbness, tingling or new weaknesses Behavioral/Psych: Mood is stable, no new changes  All other systems were reviewed with the patient and are negative.  MEDICAL HISTORY:  Past Medical History  Diagnosis Date  . Hypertension   . Diabetes mellitus   . Arthritis     SURGICAL HISTORY: Past Surgical History  Procedure Laterality Date  . Abdominal hysterectomy      has ovaries  . Breast excisional biopsy  May 2011    I have reviewed the social history and family history with the patient and they are  unchanged from previous note.  ALLERGIES:  is allergic to ace inhibitors.  MEDICATIONS:  Current Outpatient Prescriptions  Medication Sig Dispense Refill  . aspirin EC 81 MG EC tablet Take 1 tablet (81 mg total) by mouth daily. 150 tablet 2  . ferrous sulfate 325 (65 FE) MG tablet Take 1 tablet (325 mg total) by mouth daily with breakfast. 30 tablet 5  . LANTUS 100 UNIT/ML injection INJECT 0.35 MLS (35 UNITS TOTAL) INTO THE SKIN DAILY. 10 mL 3  . losartan-hydrochlorothiazide (HYZAAR) 100-25 MG tablet Take 1 tablet by mouth daily. 90 tablet 1  . lovastatin (MEVACOR) 40 MG tablet Take 1 tablet (40 mg total) by mouth at bedtime. 90 tablet 1  . metFORMIN (GLUCOPHAGE) 1000 MG tablet Take 1 tablet (1,000 mg total) by mouth 2 (two) times daily with a meal. 180 tablet 1  . tamoxifen (NOLVADEX) 20 MG tablet TAKE 1 TABLET BY MOUTH EVERY DAY 90 tablet 2   No current facility-administered medications for this visit.    PHYSICAL EXAMINATION: ECOG PERFORMANCE STATUS: 0 - Asymptomatic  Filed Vitals:   04/29/15 0849  BP: 146/89  Pulse: 79  Temp: 98.2 F (36.8 C)  Resp: 18   Filed Weights   04/29/15 0849  Weight: 198 lb 3.2 oz (89.903 kg)    GENERAL:alert, no distress and comfortable SKIN: skin color, texture, turgor are normal, no rashes or significant lesions EYES: normal, Conjunctiva are pink and non-injected, sclera clear OROPHARYNX:no exudate, no erythema and lips, buccal mucosa, and tongue normal  NECK: supple, thyroid normal size, non-tender, without nodularity LYMPH:  no palpable lymphadenopathy in the cervical, axillary or inguinal LUNGS: clear to auscultation and  percussion with normal breathing effort HEART: regular rate & rhythm and no murmurs and no lower extremity edema ABDOMEN:abdomen soft, non-tender and normal bowel sounds Musculoskeletal:no cyanosis of digits and no clubbing  NEURO: alert & oriented x 3 with fluent speech, no focal motor/sensory deficits BREAST EXAM: In  the supine position, with the right arm over the head, right nipple is everted. No periareolar edema or nipple discharge. No mass in any quadrant or subareolar region. Remote lumpectomy incision, 11 o'clock position. 6 o'clock position, ovoid scar, 11 mm. No redness of the skin. No right axillary adenopathy. With the left arm over the head, left nipple is everted. No periareolar edema or nipple discharge. No mass in any quadrant or subareolar region. No redness of the skin. There is a 1.5cm node at right axilla, smooth, movable, nontender. No left axillary adenopathy.    LABORATORY DATA:  I have reviewed the data as listed CBC Latest Ref Rng 04/29/2015 04/30/2014 04/30/2013  WBC 3.9 - 10.3 10e3/uL 3.6(L) 5.1 5.3  Hemoglobin 11.6 - 15.9 g/dL 11.0(L) 11.7 11.2(L)  Hematocrit 34.8 - 46.6 % 34.9 37.0 34.8  Platelets 145 - 400 10e3/uL 235 269 258     CMP Latest Ref Rng 04/29/2015 04/30/2014 04/30/2013  Glucose 70 - 140 mg/dl 245(H) 109 180(H)  BUN 7.0 - 26.0 mg/dL 15.4 24.1 12.3  Creatinine 0.6 - 1.1 mg/dL 0.9 0.8 0.8  Sodium 136 - 145 mEq/L 139 139 139  Potassium 3.5 - 5.1 mEq/L 4.1 4.0 3.8  Chloride 96 - 112 mEq/L - - -  CO2 22 - 29 mEq/L 26 26 26   Calcium 8.4 - 10.4 mg/dL 9.3 9.6 8.6  Total Protein 6.4 - 8.3 g/dL 7.1 7.2 6.9  Total Bilirubin 0.20 - 1.20 mg/dL 0.30 <0.20 <0.20  Alkaline Phos 40 - 150 U/L 62 59 60  AST 5 - 34 U/L 14 16 14   ALT 0 - 55 U/L 12 13 9       RADIOGRAPHIC STUDIES: I have personally reviewed the radiological images as listed and agreed with the findings in the report.  [place] right breast and axilla 06/01/2014  IMPRESSION: Normal benign appearing lymph nodes within the right axilla, likely representing the palpable area of thickening. No abnormalities identified within the right axilla otherwise.  No mammographic evidence of right breast malignancy.  RECOMMENDATION: Bilateral screening mammograms in 5 months to resume annual mammogram schedule.  MM  digital screening bilateral 11/05/2014 IMPRESSION: No mammographic evidence of malignancy. A result letter of this screening mammogram will be mailed directly to the patient.  RECOMMENDATION: Screening mammogram in one year. (Code:SM-B-01Y)    ASSESSMENT & PLAN:  59 y/o female with:  1. 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 No evidence of malignancy. -She is clinically doing well,  her physical exam and last mammogram 6 months ago was normal.  - she has completed 5 years of tamoxifen as  chemoprevention for breast cancer. I told her to stop tamoxifen. -we again reviewed breast cancer surveillance, she will continue annual screening mammogram, perform self exam. -she will continue follow up with her PCP for annual breast exam -I encourage her to continue healthy diet and exercise, and try to loose some weight.   2. Anemia -She has mild chronic anemia for many years, previous iron study showed mild low iron saturation, B12 was normal, -She has family history of sickle cell anemia -We'll repeat her iron study, methylmalonic acid level, and hemoglobin electrophoresis  2. Hypertension and  diabetes Follow-up with her primary care physician.  Plan -stop Tamoxifen, She has completed 5 years therapy -anemia lab today, I will call her with result -she will follow-up with her primary care physician, I'll only see her as needed in the future  I spent 20 minutes counseling the patient face to face. The total time spent in the appointment was 25 minutes and more than 50% was on counseling and review of test results     Truitt Merle, MD 04/29/2015 9:11 AM

## 2015-04-29 NOTE — Telephone Encounter (Signed)
per pof to sch pt appt-gaqve pt copy of avs-sent back to lab

## 2015-05-03 LAB — FOLATE RBC: RBC Folate: 541 ng/mL

## 2015-05-03 LAB — HEMOGLOBINOPATHY EVALUATION
Hemoglobin Other: 0 %
Hgb A2 Quant: 2 % — ABNORMAL LOW (ref 2.2–3.2)
Hgb A: 98 % — ABNORMAL HIGH (ref 96.8–97.8)
Hgb F Quant: 0 % (ref 0.0–2.0)
Hgb S Quant: 0 %

## 2015-05-03 LAB — METHYLMALONIC ACID, SERUM: Methylmalonic Acid, Quant: 146 nmol/L (ref 87–318)

## 2015-05-04 ENCOUNTER — Telehealth: Payer: Self-pay | Admitting: Hematology

## 2015-05-04 NOTE — Telephone Encounter (Signed)
I called pt back about her lab results from last week, iron saturation slightly low, others were normal. I suggested her to take MVI with mineral.   Truitt Merle  05/04/2015

## 2015-06-26 ENCOUNTER — Other Ambulatory Visit: Payer: Self-pay | Admitting: Family Medicine

## 2015-07-09 ENCOUNTER — Encounter: Payer: Self-pay | Admitting: Family Medicine

## 2015-07-09 ENCOUNTER — Ambulatory Visit (INDEPENDENT_AMBULATORY_CARE_PROVIDER_SITE_OTHER): Payer: Commercial Managed Care - PPO | Admitting: Family Medicine

## 2015-07-09 VITALS — BP 131/79 | HR 78 | Temp 98.3°F | Ht 67.0 in | Wt 192.0 lb

## 2015-07-09 DIAGNOSIS — R252 Cramp and spasm: Secondary | ICD-10-CM | POA: Diagnosis not present

## 2015-07-09 DIAGNOSIS — Z794 Long term (current) use of insulin: Secondary | ICD-10-CM

## 2015-07-09 DIAGNOSIS — R197 Diarrhea, unspecified: Secondary | ICD-10-CM

## 2015-07-09 DIAGNOSIS — E1149 Type 2 diabetes mellitus with other diabetic neurological complication: Secondary | ICD-10-CM

## 2015-07-09 DIAGNOSIS — E119 Type 2 diabetes mellitus without complications: Secondary | ICD-10-CM

## 2015-07-09 DIAGNOSIS — IMO0002 Reserved for concepts with insufficient information to code with codable children: Secondary | ICD-10-CM

## 2015-07-09 DIAGNOSIS — I10 Essential (primary) hypertension: Secondary | ICD-10-CM

## 2015-07-09 DIAGNOSIS — E118 Type 2 diabetes mellitus with unspecified complications: Secondary | ICD-10-CM

## 2015-07-09 DIAGNOSIS — R634 Abnormal weight loss: Secondary | ICD-10-CM

## 2015-07-09 DIAGNOSIS — E1165 Type 2 diabetes mellitus with hyperglycemia: Secondary | ICD-10-CM

## 2015-07-09 HISTORY — DX: Abnormal weight loss: R63.4

## 2015-07-09 LAB — MAGNESIUM: Magnesium: 1.7 mg/dL (ref 1.5–2.5)

## 2015-07-09 LAB — POCT GLYCOSYLATED HEMOGLOBIN (HGB A1C): Hemoglobin A1C: 8.5

## 2015-07-09 MED ORDER — INSULIN GLARGINE 100 UNIT/ML ~~LOC~~ SOLN: SUBCUTANEOUS | Status: DC

## 2015-07-09 MED ORDER — GABAPENTIN 300 MG PO CAPS: 300.0000 mg | ORAL_CAPSULE | Freq: Two times a day (BID) | ORAL | Status: DC

## 2015-07-09 NOTE — Assessment & Plan Note (Signed)
??   Viral cause. Abdominal exam benign. Seems to be stable now. Does not appear dehydrated. I recommended conservative measures. Keep self well hydrated. Age appropriate diet as tolerated. Good feco-oral hygiene. F/U in 2 wks for reassessment or sooner if worsening.

## 2015-07-09 NOTE — Assessment & Plan Note (Signed)
Started on Gabapentin. Medication e-prescribed. Foot exam otherwise normal.

## 2015-07-09 NOTE — Assessment & Plan Note (Signed)
DM poorly controlled. A1C increased to 8. Continue Metformin 1000mg  BID. Increase Lantus to 40 unit qd. Continue home CBG monitoring. Hold insulin if less than 70. F/U in 2 wks for reassessment.

## 2015-07-09 NOTE — Assessment & Plan Note (Signed)
Etiology unclear. TSH, Mg checked today. Keep self well hydrated.

## 2015-07-09 NOTE — Assessment & Plan Note (Signed)
Unintentional weight loss of about 6-7 lbs over 2 months. She endorsed poor appetite. Per patient she is due for colonoscopy next year but I was unable to find her report on Epic. I will reassess her in 2 wks and consider further work up if worsening. She agreed with plan.

## 2015-07-09 NOTE — Assessment & Plan Note (Signed)
Optimally controlled on current regimen

## 2015-07-09 NOTE — Progress Notes (Signed)
Subjective:     Patient ID: Kathy Duncan, female   DOB: [birth year], 60 y.o.   MRN: [id number]  HPI DM2: She is compliant with Lantus 35 unit qd and metformin 1000 mg BID. At times her CBG is elevated, her last check was yesterday after and it was 172. Denies hypoglycemic episodes. Appetite poor, she eats good diet. HTN: Here for follow up. She denies any concern. Doing well on her meds. Muscle cramp: C/O muscle cramps on her legs for 1 month, on and off, occurs mostly at night but now it occurs anytime, gradually worsening. She gets up and walk which relieves her symptoms. Foot pain: B/L numbness, throbbing and burning pain of her feet. Wearing sucks worsens her symptoms. On going for few months. Gradually worsening. Diarrhea/Weight loss:C/O  Diarrhea x few weeks, nauseous no vomiting. No blood in her stool. Appetite is low. There is associated weight loss.  Current Outpatient Prescriptions on File Prior to Visit  Medication Sig Dispense Refill  . aspirin EC 81 MG EC tablet Take 1 tablet (81 mg total) by mouth daily. 150 tablet 2  . ferrous sulfate 325 (65 FE) MG tablet Take 1 tablet (325 mg total) by mouth daily with breakfast. 30 tablet 5  . LANTUS 100 UNIT/ML injection INJECT 0.35 MLS (35 UNITS TOTAL) INTO THE SKIN DAILY. 10 mL 3  . losartan-hydrochlorothiazide (HYZAAR) 100-25 MG tablet Take 1 tablet by mouth daily. 90 tablet 1  . lovastatin (MEVACOR) 40 MG tablet Take 1 tablet (40 mg total) by mouth at bedtime. 90 tablet 1  . metFORMIN (GLUCOPHAGE) 1000 MG tablet Take 1 tablet (1,000 mg total) by mouth 2 (two) times daily with a meal. 180 tablet 1  . tamoxifen (NOLVADEX) 20 MG tablet TAKE 1 TABLET BY MOUTH EVERY DAY 90 tablet 2   No current facility-administered medications on file prior to visit.   Past Medical History  Diagnosis Date  . Hypertension   . Diabetes mellitus   . Arthritis   . KNEE MENISCUS INJURY, UNSPECIFIED 07/12/2006    Qualifier: History of  By: Carmie End MD, Erin        Review of Systems  Respiratory: Negative.   Cardiovascular: Negative.   Gastrointestinal: Positive for nausea and diarrhea. Negative for vomiting and blood in stool.       Abdominal cramping  Genitourinary: Negative.   All other systems reviewed and are negative.      Filed Vitals:   07/09/15 0939  BP: 131/79  Pulse: 78  Temp: 98.3 F (36.8 C)    Objective:   Physical Exam  Constitutional: She is oriented to person, place, and time. She appears well-developed. No distress.  Body mass index is 30.06 kg/(m^2).  Cardiovascular: Normal rate, regular rhythm, normal heart sounds and intact distal pulses.   No murmur heard. Pulmonary/Chest: Effort normal and breath sounds normal. No respiratory distress. She has no wheezes.  Abdominal: Soft. Bowel sounds are normal. She exhibits no distension and no mass. There is no tenderness. There is no rebound and no guarding.  Musculoskeletal: Normal range of motion. She exhibits no edema.  Sensory exam of the foot is normal, tested with the monofilament. Good pulses, no lesions or ulcers, good peripheral pulses.   Neurological: She is alert and oriented to person, place, and time. No cranial nerve deficit.  Nursing note and vitals reviewed.      Assessment:     DM2 HTN Muscle cramps Foot pain: DM neuropathic pain Diarrhea Weight loss  Plan:  Check problem list.

## 2015-07-09 NOTE — Patient Instructions (Signed)
It was nice seeing you today. I am sorry about your foot pain, it sounds like you have neuropathic pain from your DM. Please start Gabapentin for this. Your DM is not well controlled, Please increase Lantus to 40 units daily and continue Metformi. Let us keep an eye on your diarrhea for now. Keep well hydrated. See me in 2 wks for reassessment.

## 2015-07-10 LAB — TSH: TSH: 1.28 mIU/L

## 2015-07-13 ENCOUNTER — Telehealth: Payer: Self-pay | Admitting: Family Medicine

## 2015-07-13 NOTE — Telephone Encounter (Signed)
Call back about test result.

## 2015-07-14 ENCOUNTER — Telehealth: Payer: Self-pay | Admitting: Family Medicine

## 2015-07-14 NOTE — Telephone Encounter (Signed)
Returned your call.  Call back and leave msg if not available.

## 2015-07-14 NOTE — Telephone Encounter (Signed)
Message left about normal result as requested.

## 2015-07-23 ENCOUNTER — Ambulatory Visit (INDEPENDENT_AMBULATORY_CARE_PROVIDER_SITE_OTHER): Payer: Commercial Managed Care - PPO | Admitting: Family Medicine

## 2015-07-23 ENCOUNTER — Encounter: Payer: Self-pay | Admitting: Family Medicine

## 2015-07-23 VITALS — BP 119/77 | HR 104 | Temp 98.4°F | Wt 196.0 lb

## 2015-07-23 DIAGNOSIS — E118 Type 2 diabetes mellitus with unspecified complications: Secondary | ICD-10-CM

## 2015-07-23 DIAGNOSIS — E1165 Type 2 diabetes mellitus with hyperglycemia: Secondary | ICD-10-CM

## 2015-07-23 DIAGNOSIS — L259 Unspecified contact dermatitis, unspecified cause: Secondary | ICD-10-CM | POA: Diagnosis not present

## 2015-07-23 DIAGNOSIS — R634 Abnormal weight loss: Secondary | ICD-10-CM

## 2015-07-23 DIAGNOSIS — R252 Cramp and spasm: Secondary | ICD-10-CM

## 2015-07-23 DIAGNOSIS — R197 Diarrhea, unspecified: Secondary | ICD-10-CM

## 2015-07-23 DIAGNOSIS — Z794 Long term (current) use of insulin: Secondary | ICD-10-CM

## 2015-07-23 DIAGNOSIS — IMO0002 Reserved for concepts with insufficient information to code with codable children: Secondary | ICD-10-CM

## 2015-07-23 MED ORDER — BETAMETHASONE VALERATE 0.1 % EX CREA: TOPICAL_CREAM | Freq: Two times a day (BID) | CUTANEOUS | Status: DC

## 2015-07-23 NOTE — Assessment & Plan Note (Signed)
She actually gained 4 lbs since last visits 2 wks ago. She likely lost some weight from her diarrhea. Continue health appropriate diet. I advised her to obtain record of her colonoscopy result to ensure up to date cancer screening. She agreed with plan.

## 2015-07-23 NOTE — Assessment & Plan Note (Signed)
Home CBG looks good. Continue current regimen. F/U in 3 months.

## 2015-07-23 NOTE — Progress Notes (Signed)
Subjective:     Patient ID: Kathy Duncan, female   DOB: Apr 05, 1956, 60 y.o.   MRN: [id number]  HPI DM2: She is compliant with lantus 40 units qd and metformin 1000mg  BID. Her home CBG: Evening 190,174,84,166, Morning 118,91,80,70,132,92. Denies hypoglycemic episodes. Rash:C/O rash in the dorsum of her hands for 1 month, worsening. Associated wit intense itching. She attributed it to the new hand sanitizer at work. She used Triamcinolone with no improvement. Diarrhea: Resolved. Muscle cramps:Resolved Weight loss: Here for follow up. I asked her about colonoscopy, she stated she had it done in 2008 with Dr. Collene Mares and she is due for colonoscopy next year. Denies any new concern. Diet has improved since she stopped stooling.  Current Outpatient Prescriptions on File Prior to Visit  Medication Sig Dispense Refill  . aspirin EC 81 MG EC tablet Take 1 tablet (81 mg total) by mouth daily. 150 tablet 2  . ferrous sulfate 325 (65 FE) MG tablet Take 1 tablet (325 mg total) by mouth daily with breakfast. 30 tablet 5  . gabapentin (NEURONTIN) 300 MG capsule Take 1 capsule (300 mg total) by mouth 2 (two) times daily. 60 capsule 1  . insulin glargine (LANTUS) 100 UNIT/ML injection Take 40 units daily in the morning. Check CBG 3 times daily. Hold insulin if glucose is less than 70. 10 mL 3  . losartan-hydrochlorothiazide (HYZAAR) 100-25 MG tablet Take 1 tablet by mouth daily. 90 tablet 1  . lovastatin (MEVACOR) 40 MG tablet Take 1 tablet (40 mg total) by mouth at bedtime. 90 tablet 1  . metFORMIN (GLUCOPHAGE) 1000 MG tablet Take 1 tablet (1,000 mg total) by mouth 2 (two) times daily with a meal. 180 tablet 1   No current facility-administered medications on file prior to visit.   Past Medical History  Diagnosis Date  . Hypertension   . Diabetes mellitus   . Arthritis   . KNEE MENISCUS INJURY, UNSPECIFIED 07/12/2006    Qualifier: History of  By: Carmie End MD, Erin       Review of Systems  Respiratory:  Negative.   Cardiovascular: Negative.   Gastrointestinal: Negative.   Genitourinary: Negative.   All other systems reviewed and are negative.      Filed Vitals:   07/23/15 1119  BP: 119/77  Pulse: 104  Temp: 98.4 F (36.9 C)  TempSrc: Oral  Weight: 196 lb (88.905 kg)    Objective:   Physical Exam  Constitutional: She is oriented to person, place, and time. She appears well-developed. No distress.  Cardiovascular: Normal rate, regular rhythm and normal heart sounds.   No murmur heard. Pulmonary/Chest: Effort normal and breath sounds normal. No respiratory distress. She has no wheezes.  Abdominal: Soft. Bowel sounds are normal. She exhibits no distension and no mass. There is no tenderness.  Musculoskeletal: Normal range of motion. She exhibits no edema.  Neurological: She is alert and oriented to person, place, and time. No cranial nerve deficit.  Nursing note and vitals reviewed.      Assessment:     DM2 Contact dermatitis Diarrhea Muscle cramps Weight loss     Plan:     Check problem list.

## 2015-07-23 NOTE — Assessment & Plan Note (Signed)
Betamethasone cream prescribed. F/U soon if no improvement.

## 2015-07-23 NOTE — Progress Notes (Deleted)
Patient ID: Kathy Duncan, female   DOB: 02-15-1956, 60 y.o.   MRN: [id number]

## 2015-07-23 NOTE — Patient Instructions (Signed)
It was nice seeing you today. Your DM is doing well as far as I am concern. Let us continue same medication. I will see you back in 3 months. Please start Betamethasone cream for your rash. If it not helping please call me.

## 2015-07-23 NOTE — Assessment & Plan Note (Signed)
Resolved

## 2015-08-26 ENCOUNTER — Other Ambulatory Visit: Payer: Self-pay | Admitting: Family Medicine

## 2015-09-13 ENCOUNTER — Ambulatory Visit (INDEPENDENT_AMBULATORY_CARE_PROVIDER_SITE_OTHER): Payer: Commercial Managed Care - PPO | Admitting: Family Medicine

## 2015-09-13 ENCOUNTER — Encounter: Payer: Self-pay | Admitting: Family Medicine

## 2015-09-13 VITALS — BP 147/69 | HR 100 | Temp 98.1°F | Wt 200.0 lb

## 2015-09-13 DIAGNOSIS — J029 Acute pharyngitis, unspecified: Secondary | ICD-10-CM

## 2015-09-13 DIAGNOSIS — J069 Acute upper respiratory infection, unspecified: Secondary | ICD-10-CM

## 2015-09-13 DIAGNOSIS — B354 Tinea corporis: Secondary | ICD-10-CM

## 2015-09-13 DIAGNOSIS — B9789 Other viral agents as the cause of diseases classified elsewhere: Principal | ICD-10-CM

## 2015-09-13 MED ORDER — CLOTRIMAZOLE 1 % EX CREA: 1.0000 "application " | TOPICAL_CREAM | Freq: Two times a day (BID) | CUTANEOUS | Status: DC

## 2015-09-13 NOTE — Progress Notes (Signed)
Date of Visit: 09/13/2015   HPI:  Patient presents for a same day appointment to discuss sore throat.  Has been sick since yesterday. Has sore throat. Coughing with some mucous. No fever. Mild runny nose. Drinking normally, decreased appetite for solids due to sore throat. Grandchild was sick with similar symptoms last week. Taking over the counter tussinDM. Denies shortness of breath.   Also notes annular lesion/rash to R medial thigh. Just noticed it. Does not hurt or itch. No contacts with similar rashes. Not elsewhere on body.   ROS: See HPI  Remy: history of obesity, atypical ductal hyperplasia, bilateral carpal tunnel syndrome, anemia, hyperlipidemia, hypertension, type 2 diabetes with neuropathy  PHYSICAL EXAM: BP 147/69 mmHg  Pulse 100  Temp(Src) 98.1 F (36.7 C) (Oral)  Wt 200 lb (90.719 kg) Gen: NAD, pleasant, cooperative HEENT: normocephalic, atraumatic. Tympanic membranes clear bilaterally. Nares patent. Oropharynx erythematous without exudates. Mild anterior cervical lymphadenopathy. Moist mucous membranes  Heart: regular rate and rhythm, no murmur Lungs: clear to auscultation bilaterally, normal work of breathing  Abdomen: soft nontender to palpation  Neuro: alert, grossly nonfocal, speech normal Skin: annular hyperpigmented lesion approximately 1.5cm in diameter on R medial thigh.  ASSESSMENT/PLAN:  1. Sore throat - likely viral. Recommend supportive care. No signs of bacterial infection today. Given handout. Follow up if not improved in 1 week.  2. Tinea corporis - rx clotrimazole twice daily.  FOLLOW UP: Follow up as needed if symptoms worsen or fail to improve.    [place]. Ardelia Mems, [place]

## 2015-09-13 NOTE — Patient Instructions (Signed)
It was nice to meet you today!  For your cold: - ok to continue the over the counter cough medication - cough drops - saline spray in nose - warm salt water/tea gargles - honey - return if not better in 1 week - stay hydrated with plenty of liquids  For ringworm: - apply clotrimazole twice daily until resolves  Be well, Dr. Ardelia Mems   Upper Respiratory Infection, Adult Most upper respiratory infections (URIs) are a viral infection of the air passages leading to the lungs. A URI affects the nose, throat, and upper air passages. The most common type of URI is nasopharyngitis and is typically referred to as "the common cold." URIs run their course and usually go away on their own. Most of the time, a URI does not require medical attention, but sometimes a bacterial infection in the upper airways can follow a viral infection. This is called a secondary infection. Sinus and middle ear infections are common types of secondary upper respiratory infections. Bacterial pneumonia can also complicate a URI. A URI can worsen asthma and chronic obstructive pulmonary disease (COPD). Sometimes, these complications can require emergency medical care and may be life threatening.  CAUSES Almost all URIs are caused by viruses. A virus is a type of germ and can spread from one person to another.  RISKS FACTORS You may be at risk for a URI if:   You smoke.   You have chronic heart or lung disease.  You have a weakened defense (immune) system.   You are very young or very old.   You have nasal allergies or asthma.  You work in crowded or poorly ventilated areas.  You work in health care facilities or schools. SIGNS AND SYMPTOMS  Symptoms typically develop 2-3 days after you come in contact with a cold virus. Most viral URIs last 7-10 days. However, viral URIs from the influenza virus (flu virus) can last 14-18 days and are typically more severe. Symptoms may include:   Runny or stuffy  (congested) nose.   Sneezing.   Cough.   Sore throat.   Headache.   Fatigue.   Fever.   Loss of appetite.   Pain in your forehead, behind your eyes, and over your cheekbones (sinus pain).  Muscle aches.  DIAGNOSIS  Your health care provider may diagnose a URI by:  Physical exam.  Tests to check that your symptoms are not due to another condition such as:  Strep throat.  Sinusitis.  Pneumonia.  Asthma. TREATMENT  A URI goes away on its own with time. It cannot be cured with medicines, but medicines may be prescribed or recommended to relieve symptoms. Medicines may help:  Reduce your fever.  Reduce your cough.  Relieve nasal congestion. HOME CARE INSTRUCTIONS   Take medicines only as directed by your health care provider.   Gargle warm saltwater or take cough drops to comfort your throat as directed by your health care provider.  Use a warm mist humidifier or inhale steam from a shower to increase air moisture. This may make it easier to breathe.  Drink enough fluid to keep your urine clear or pale yellow.   Eat soups and other clear broths and maintain good nutrition.   Rest as needed.   Return to work when your temperature has returned to normal or as your health care provider advises. You may need to stay home longer to avoid infecting others. You can also use a face mask and careful hand washing to prevent  spread of the virus.  Increase the usage of your inhaler if you have asthma.   Do not use any tobacco products, including cigarettes, chewing tobacco, or electronic cigarettes. If you need help quitting, ask your health care provider. PREVENTION  The best way to protect yourself from getting a cold is to practice good hygiene.   Avoid oral or hand contact with people with cold symptoms.   Wash your hands often if contact occurs.  There is no clear evidence that vitamin C, vitamin E, echinacea, or exercise reduces the chance of  developing a cold. However, it is always recommended to get plenty of rest, exercise, and practice good nutrition.  SEEK MEDICAL CARE IF:   You are getting worse rather than better.   Your symptoms are not controlled by medicine.   You have chills.  You have worsening shortness of breath.  You have brown or red mucus.  You have yellow or brown nasal discharge.  You have pain in your face, especially when you bend forward.  You have a fever.  You have swollen neck glands.  You have pain while swallowing.  You have white areas in the back of your throat. SEEK IMMEDIATE MEDICAL CARE IF:   You have severe or persistent:  Headache.  Ear pain.  Sinus pain.  Chest pain.  You have chronic lung disease and any of the following:  Wheezing.  Prolonged cough.  Coughing up blood.  A change in your usual mucus.  You have a stiff neck.  You have changes in your:  Vision.  Hearing.  Thinking.  Mood. MAKE SURE YOU:   Understand these instructions.  Will watch your condition.  Will get help right away if you are not doing well or get worse.   This information is not intended to replace advice given to you by your health care provider. Make sure you discuss any questions you have with your health care provider.   Document Released: 10/25/2000 Document Revised: 09/15/2014 Document Reviewed: 08/06/2013 Elsevier Interactive Patient Education [employer].

## 2015-10-21 ENCOUNTER — Other Ambulatory Visit: Payer: Self-pay | Admitting: Family Medicine

## 2015-10-24 ENCOUNTER — Other Ambulatory Visit: Payer: Self-pay | Admitting: Family Medicine

## 2015-10-27 ENCOUNTER — Other Ambulatory Visit: Payer: Self-pay | Admitting: Family Medicine

## 2015-11-02 ENCOUNTER — Other Ambulatory Visit: Payer: Self-pay | Admitting: Family Medicine

## 2015-11-02 DIAGNOSIS — Z139 Encounter for screening, unspecified: Secondary | ICD-10-CM

## 2015-11-09 ENCOUNTER — Other Ambulatory Visit: Payer: Self-pay | Admitting: Family Medicine

## 2015-11-19 ENCOUNTER — Ambulatory Visit
Admission: RE | Admit: 2015-11-19 | Discharge: 2015-11-19 | Disposition: A | Discharge status: Home / Self Care | Payer: Commercial Managed Care - PPO | Source: Ambulatory Visit | Attending: Family Medicine | Admitting: Family Medicine

## 2015-11-19 DIAGNOSIS — Z139 Encounter for screening, unspecified: Secondary | ICD-10-CM

## 2015-11-22 ENCOUNTER — Encounter: Payer: Self-pay | Admitting: Family Medicine

## 2015-11-23 ENCOUNTER — Encounter: Payer: Self-pay | Admitting: Family Medicine

## 2015-11-23 ENCOUNTER — Ambulatory Visit (INDEPENDENT_AMBULATORY_CARE_PROVIDER_SITE_OTHER): Payer: Commercial Managed Care - PPO | Admitting: Family Medicine

## 2015-11-23 VITALS — BP 149/75 | HR 80 | Temp 98.2°F | Wt 198.0 lb

## 2015-11-23 DIAGNOSIS — Z794 Long term (current) use of insulin: Secondary | ICD-10-CM | POA: Diagnosis not present

## 2015-11-23 DIAGNOSIS — E118 Type 2 diabetes mellitus with unspecified complications: Secondary | ICD-10-CM

## 2015-11-23 DIAGNOSIS — I10 Essential (primary) hypertension: Secondary | ICD-10-CM

## 2015-11-23 DIAGNOSIS — IMO0002 Reserved for concepts with insufficient information to code with codable children: Secondary | ICD-10-CM

## 2015-11-23 DIAGNOSIS — E1165 Type 2 diabetes mellitus with hyperglycemia: Secondary | ICD-10-CM | POA: Diagnosis not present

## 2015-11-23 DIAGNOSIS — M2550 Pain in unspecified joint: Secondary | ICD-10-CM | POA: Diagnosis not present

## 2015-11-23 LAB — POCT GLYCOSYLATED HEMOGLOBIN (HGB A1C): Hemoglobin A1C: 8

## 2015-11-23 MED ORDER — BETAMETHASONE VALERATE 0.1 % EX CREA: TOPICAL_CREAM | Freq: Two times a day (BID) | CUTANEOUS | Status: DC

## 2015-11-23 MED ORDER — SITAGLIPTIN PHOSPHATE 50 MG PO TABS: 50.0000 mg | ORAL_TABLET | Freq: Every day | ORAL | Status: DC

## 2015-11-23 MED ORDER — GABAPENTIN 300 MG PO CAPS: 300.0000 mg | ORAL_CAPSULE | Freq: Two times a day (BID) | ORAL | Status: DC

## 2015-11-23 MED ORDER — NAPROXEN 500 MG PO TABS: 500.0000 mg | ORAL_TABLET | Freq: Two times a day (BID) | ORAL | Status: DC | PRN

## 2015-11-23 NOTE — Assessment & Plan Note (Signed)
A1C improved from 8.5 to 8. Plan to continue current dose of insulin and Metformin. Add Januvia to regimen. Continue home CBG monitoring. I will see her back in 3 months. Return precaution discussed.

## 2015-11-23 NOTE — Patient Instructions (Signed)
It was nice seeing today. Your BP improved some after rest, we will continue same BP medication for now. I added Januvia to your diabetes medication because your A1C is still not at goal. I will see you back in 3 months. Continue glucose check at home and call me if you have any concern regarding your medication.

## 2015-11-23 NOTE — Assessment & Plan Note (Signed)
BP initially elevated. Repeat BP done manually by me improved a lot. No change in her BP regimen for now. Continue monitoring.

## 2015-11-23 NOTE — Progress Notes (Signed)
Subjective:     Patient ID: Kathy Duncan, female   DOB: 04-Jun-1955, 60 y.o.   MRN: [id number]  HPI DM2:She is compliant with Lantus 40 units qd, Metformin 1000 mg BID, her CBG at home ranges from 80 to 200+. Denies hypoglycemic symptoms. HTN: Here for BP follow up. She is compliant with her meds, no concern. Joint pain: C/O shoulder, knee pain b/l on and off for the last few months. She attributed it to the nature of her job, she is constantly lifting boxes, stands on her feet all day. Denies any direct trauma or injury. She uses Advil as needed with minimal improvement.  Current Outpatient Prescriptions on File Prior to Visit  Medication Sig Dispense Refill  . aspirin EC 81 MG EC tablet Take 1 tablet (81 mg total) by mouth daily. 150 tablet 2  . betamethasone valerate (VALISONE) 0.1 % cream Apply topically 2 (two) times daily. 30 g 1  . ferrous sulfate 325 (65 FE) MG tablet Take 1 tablet (325 mg total) by mouth daily with breakfast. 30 tablet 5  . insulin glargine (LANTUS) 100 UNIT/ML injection Inject 0.4 mLs (40 Units total) into the skin daily. 10 mL 3  . losartan-hydrochlorothiazide (HYZAAR) 100-25 MG tablet Take 1 tablet by mouth daily. 90 tablet 1  . lovastatin (MEVACOR) 40 MG tablet TAKE 1 TABLET (40 MG TOTAL) BY MOUTH AT BEDTIME. 90 tablet 2  . metFORMIN (GLUCOPHAGE) 1000 MG tablet TAKE 1 TABLET (1,000 MG TOTAL) BY MOUTH 2 (TWO) TIMES DAILY WITH A MEAL. 180 tablet 2  . gabapentin (NEURONTIN) 300 MG capsule TAKE ONE CAPSULE BY MOUTH TWICE A DAY (Patient not taking: Reported on 11/23/2015) 60 capsule 1   No current facility-administered medications on file prior to visit.   Past Medical History  Diagnosis Date  . Hypertension   . Diabetes mellitus   . Arthritis   . KNEE MENISCUS INJURY, UNSPECIFIED 07/12/2006    Qualifier: History of  By: Carmie End MD, Junie Panning    . Ganglion cyst of wrist 10/14/2013     Review of Systems  Respiratory: Negative.   Cardiovascular: Negative.    Gastrointestinal: Negative.   Musculoskeletal: Positive for arthralgias. Negative for back pain and joint swelling.  All other systems reviewed and are negative.  Filed Vitals:   11/23/15 0857 11/23/15 0917  BP: 152/75 149/75  Pulse: 80   Temp: 98.2 F (36.8 C)   TempSrc: Oral   Weight: 198 lb (89.812 kg)   SpO2: 100%         Objective:   Physical Exam  Constitutional: She is oriented to person, place, and time. She appears well-developed. No distress.  Cardiovascular: Normal rate, regular rhythm and normal heart sounds.   No murmur heard. Pulmonary/Chest: Effort normal and breath sounds normal. No respiratory distress. She has no wheezes.  Abdominal: Soft. Bowel sounds are normal. She exhibits no distension. There is no tenderness.  Musculoskeletal: Normal range of motion. She exhibits no edema.  Neurological: She is alert and oriented to person, place, and time. No cranial nerve deficit.  Psychiatric: She has a normal mood and affect.  Nursing note and vitals reviewed.      Assessment:     DM2 HTN Joint pain     Plan:     Check problem list.

## 2015-12-07 ENCOUNTER — Other Ambulatory Visit: Payer: Self-pay | Admitting: Family Medicine

## 2015-12-13 ENCOUNTER — Telehealth: Payer: Self-pay | Admitting: *Deleted

## 2015-12-13 NOTE — Telephone Encounter (Signed)
Left voice message for patient to return nurse call or call to schedule an appointment to discuss medication change with a provider.  Will forward to PCP.  Derl Barrow, RN

## 2015-12-13 NOTE — Telephone Encounter (Signed)
I called pt- unable to leave message, there was a voice mail, however, unable to leave message.

## 2015-12-13 NOTE — Telephone Encounter (Signed)
Pt states Celesta Gentile is making her sweat and have dizzy spells wants to speak to a nurse about it. Call her on this number [phone]. Deseree Kennon Holter, CMA

## 2015-12-13 NOTE — Telephone Encounter (Signed)
Although Januvia does not cause hypoglycemia, she is on Insulin which can cause this symptoms. Please advise her to check her CBG whenever she has this symptoms and record it. If symptoms worsens to hold Januvia. I will like to her to see [place] soon. It might be that she need to cut back on her insulin now that she is on Januvia.

## 2015-12-14 ENCOUNTER — Telehealth: Payer: Self-pay | Admitting: Family Medicine

## 2015-12-14 NOTE — Telephone Encounter (Signed)
Left voice message for patient to return nurse or call for an appointment.  Derl Barrow, RN

## 2015-12-14 NOTE — Telephone Encounter (Signed)
Pt was informed and made an appointment for 12-28-15. Thanks! ep

## 2015-12-16 ENCOUNTER — Other Ambulatory Visit: Payer: Self-pay | Admitting: Family Medicine

## 2015-12-19 ENCOUNTER — Other Ambulatory Visit: Payer: Self-pay | Admitting: Family Medicine

## 2015-12-28 ENCOUNTER — Encounter: Payer: Self-pay | Admitting: Family Medicine

## 2015-12-28 ENCOUNTER — Ambulatory Visit (INDEPENDENT_AMBULATORY_CARE_PROVIDER_SITE_OTHER): Payer: Commercial Managed Care - PPO | Admitting: Family Medicine

## 2015-12-28 VITALS — BP 127/76 | HR 85 | Temp 98.0°F | Wt 195.0 lb

## 2015-12-28 DIAGNOSIS — E785 Hyperlipidemia, unspecified: Secondary | ICD-10-CM

## 2015-12-28 DIAGNOSIS — Z1321 Encounter for screening for nutritional disorder: Secondary | ICD-10-CM

## 2015-12-28 DIAGNOSIS — Z794 Long term (current) use of insulin: Secondary | ICD-10-CM

## 2015-12-28 DIAGNOSIS — I10 Essential (primary) hypertension: Secondary | ICD-10-CM | POA: Diagnosis not present

## 2015-12-28 DIAGNOSIS — E118 Type 2 diabetes mellitus with unspecified complications: Secondary | ICD-10-CM

## 2015-12-28 DIAGNOSIS — E1165 Type 2 diabetes mellitus with hyperglycemia: Secondary | ICD-10-CM

## 2015-12-28 DIAGNOSIS — M2550 Pain in unspecified joint: Secondary | ICD-10-CM

## 2015-12-28 DIAGNOSIS — IMO0002 Reserved for concepts with insufficient information to code with codable children: Secondary | ICD-10-CM

## 2015-12-28 LAB — LIPID PANEL
Cholesterol: 141 mg/dL (ref 125–200)
HDL: 62 mg/dL (ref >=46)
LDL Cholesterol: 61 mg/dL (ref <130)
Total CHOL/HDL Ratio: 2.3 Ratio (ref <=5.0)
Triglycerides: 90 mg/dL (ref <150)
VLDL: 18 mg/dL (ref <30)

## 2015-12-28 LAB — VITAMIN B12: Vitamin B-12: 305 pg/mL (ref 200–1100)

## 2015-12-28 MED ORDER — GLUCOSE BLOOD VI STRP: ORAL_STRIP | 5 refills | Status: DC

## 2015-12-28 MED ORDER — AURORA LANCET THIN 23G MISC: 1.0000 "application " | Freq: Three times a day (TID) | 5 refills | Status: DC

## 2015-12-28 NOTE — Assessment & Plan Note (Signed)
Stop Januvia due to intolerance. Continue Lantus and Metformin. F/I in 2 months for reassessment or sooner if CBG is too high.

## 2015-12-28 NOTE — Assessment & Plan Note (Signed)
FLP checked. Will contact with result.

## 2015-12-28 NOTE — Assessment & Plan Note (Signed)
OK today. Monitor on current regimen.

## 2015-12-28 NOTE — Patient Instructions (Signed)
It was nice seeing you today. Please discontinue Januvia. Continue other medication. I will see you back in 2 months.

## 2015-12-28 NOTE — Progress Notes (Signed)
Subjective:     Patient ID: Kathy Duncan, female   DOB: 09-11-1955, 60 y.o.   MRN: [id number]  HPI DM2:Here for follow up. She self D/C Januvia due to intolerance. Her CBG ranges are: 61,82,114,186,117,222,216,253. Denies hypoglycemic episode. Need refill of her test strip and lancet. [id number] with meds. Here for follow up. [id number] with treatment. Here for follow up. Knee pain: Patient stated she continues to have knee pain and shoulder pain due to prolonged standing and lifting heavy objects at work. She plan on talking with her supervisor at work for role reassignment. She uses Naproxyn as needed.  Current Outpatient Prescriptions on File Prior to Visit  Medication Sig Dispense Refill  . aspirin EC 81 MG EC tablet Take 1 tablet (81 mg total) by mouth daily. 150 tablet 2  . ferrous sulfate 325 (65 FE) MG tablet Take 1 tablet (325 mg total) by mouth daily with breakfast. 30 tablet 5  . gabapentin (NEURONTIN) 300 MG capsule Take 1 capsule (300 mg total) by mouth 2 (two) times daily. 60 capsule 1  . insulin glargine (LANTUS) 100 UNIT/ML injection Inject 0.4 mLs (40 Units total) into the skin daily. 10 mL 3  . losartan-hydrochlorothiazide (HYZAAR) 100-25 MG tablet TAKE 1 TABLET BY MOUTH DAILY. 90 tablet 2  . lovastatin (MEVACOR) 40 MG tablet TAKE 1 TABLET (40 MG TOTAL) BY MOUTH AT BEDTIME. 90 tablet 2  . metFORMIN (GLUCOPHAGE) 1000 MG tablet TAKE 1 TABLET (1,000 MG TOTAL) BY MOUTH 2 (TWO) TIMES DAILY WITH A MEAL. 180 tablet 2  . B-D INS SYR ULTRAFINE 1CC/30G 30G X 1/2" 1 ML MISC USE TO INJECT INSULIN DAILY 100 each 2  . betamethasone valerate (VALISONE) 0.1 % cream Apply topically 2 (two) times daily. (Patient not taking: Reported on 12/28/2015) 30 g 1  . naproxen (NAPROSYN) 500 MG tablet TAKE 1 TABLET (500 MG TOTAL) BY MOUTH 2 (TWO) TIMES DAILY AS NEEDED. (Patient not taking: Reported on 12/28/2015) 60 tablet 3   No current facility-administered medications on file prior to visit.     Past Medical History:  Diagnosis Date  . Arthritis   . Diabetes mellitus   . Ganglion cyst of wrist 10/14/2013  . Hypertension   . KNEE MENISCUS INJURY, UNSPECIFIED 07/12/2006   Qualifier: History of  By: Carmie End MD, Erin       Review of Systems  Respiratory: Negative.   Cardiovascular: Negative.   Gastrointestinal: Negative.   Genitourinary: Negative.   Musculoskeletal: Positive for arthralgias.       Knee pain  Neurological: Negative.   All other systems reviewed and are negative.  Vitals:   12/28/15 1054  BP: 127/76  Pulse: 85  Temp: 98 F (36.7 C)  TempSrc: Oral  Weight: 195 lb (88.5 kg)       Objective:   Physical Exam  Constitutional: She appears well-developed. No distress.  Cardiovascular: Normal rate, regular rhythm and normal heart sounds.   No murmur heard. Pulmonary/Chest: Effort normal and breath sounds normal. No respiratory distress. She has no wheezes.  Abdominal: Soft. Bowel sounds are normal. She exhibits no distension. There is no tenderness. There is no guarding.  Musculoskeletal: Normal range of motion. She exhibits no edema.       Right knee: Normal.       Left knee: Normal.  Nursing note and vitals reviewed.      Assessment:     DM2 HTN HLD Knee pain    Plan:     Check problem list.

## 2015-12-28 NOTE — Assessment & Plan Note (Signed)
Knee and should joint pain. Due to prolonged standing and lifting heavy objects. Will discuss with supervisor for job reassignment. Continue Naproxen prn. Further work up in the future if no improvement.

## 2015-12-29 ENCOUNTER — Telehealth: Payer: Self-pay | Admitting: Family Medicine

## 2015-12-29 NOTE — Telephone Encounter (Signed)
I called both home and cell number, no response. Message left to call back.

## 2015-12-30 ENCOUNTER — Telehealth: Payer: Self-pay | Admitting: Family Medicine

## 2015-12-30 NOTE — Telephone Encounter (Signed)
She returned my call. I discussed her normal test result with her. All questions were answered.

## 2016-01-27 ENCOUNTER — Other Ambulatory Visit: Payer: Self-pay | Admitting: Family Medicine

## 2016-01-27 LAB — HM DIABETES EYE EXAM

## 2016-02-26 ENCOUNTER — Other Ambulatory Visit: Payer: Self-pay | Admitting: Family Medicine

## 2016-04-28 ENCOUNTER — Ambulatory Visit (INDEPENDENT_AMBULATORY_CARE_PROVIDER_SITE_OTHER): Payer: Commercial Managed Care - PPO | Admitting: Family Medicine

## 2016-04-28 ENCOUNTER — Encounter: Payer: Self-pay | Admitting: Family Medicine

## 2016-04-28 VITALS — BP 132/68 | HR 99 | Temp 98.4°F | Ht 67.0 in | Wt 195.0 lb

## 2016-04-28 DIAGNOSIS — E785 Hyperlipidemia, unspecified: Secondary | ICD-10-CM | POA: Diagnosis not present

## 2016-04-28 DIAGNOSIS — E118 Type 2 diabetes mellitus with unspecified complications: Secondary | ICD-10-CM

## 2016-04-28 DIAGNOSIS — I10 Essential (primary) hypertension: Secondary | ICD-10-CM | POA: Diagnosis not present

## 2016-04-28 DIAGNOSIS — M2550 Pain in unspecified joint: Secondary | ICD-10-CM

## 2016-04-28 DIAGNOSIS — Z23 Encounter for immunization: Secondary | ICD-10-CM | POA: Diagnosis not present

## 2016-04-28 DIAGNOSIS — Z794 Long term (current) use of insulin: Secondary | ICD-10-CM

## 2016-04-28 LAB — BASIC METABOLIC PANEL WITH GFR
BUN: 15 mg/dL (ref 7–25)
CO2: 26 mmol/L (ref 20–31)
Calcium: 9.4 mg/dL (ref 8.6–10.4)
Chloride: 103 mmol/L (ref 98–110)
Creat: 0.81 mg/dL (ref 0.50–0.99)
GFR, Est African American: >89 mL/min (ref >=60)
GFR, Est Non African American: 79 mL/min (ref >=60)
Glucose, Bld: 76 mg/dL (ref 65–99)
Potassium: 4 mmol/L (ref 3.5–5.3)
Sodium: 140 mmol/L (ref 135–146)

## 2016-04-28 LAB — POCT GLYCOSYLATED HEMOGLOBIN (HGB A1C): Hemoglobin A1C: 6.2

## 2016-04-28 MED ORDER — LOSARTAN POTASSIUM-HCTZ 100-25 MG PO TABS: 1.0000 | ORAL_TABLET | Freq: Every day | ORAL | 2 refills | Status: DC

## 2016-04-28 MED ORDER — NAPROXEN 500 MG PO TABS: 500.0000 mg | ORAL_TABLET | Freq: Two times a day (BID) | ORAL | 3 refills | Status: DC | PRN

## 2016-04-28 MED ORDER — METFORMIN HCL 1000 MG PO TABS
ORAL_TABLET | ORAL | 2 refills | Status: DC
Start: 2016-04-28 — End: 2016-12-24

## 2016-04-28 NOTE — Assessment & Plan Note (Addendum)
She self discontinue Januvia during last visit.  She continued her Metformin 1000mg  BID and Lantus 40 units daily and at that time her A1C was 8 Today her A1C dropped to 6.2 without any medication adjustment. However she improved on her diet and exercise. Continue current regimen for now. Metformin refilled. Continue home CBG check. F/U in 3 months for reassessment.

## 2016-04-28 NOTE — Progress Notes (Signed)
Subjective:     Patient ID: Kathy Duncan, female   DOB: March 04, 1956, 60 y.o.   MRN: [id number]  HPI DM2/HTN/HLD: Here for follow up. Compliant with all her meds. No concerns to day. She checked her CBG at home regularly. This morning it was 84. She ranges between 70 and 160. Denies hypoglycemic symptoms. Joint pain: Still C/O B/L knee and shoulder pain. Improved on Naproxen. Need refill. Trying to adjust her work. Symptoms improved some. HM: Here for vaccination update.  Current Outpatient Prescriptions on File Prior to Visit  Medication Sig Dispense Refill  . aspirin EC 81 MG EC tablet Take 1 tablet (81 mg total) by mouth daily. 150 tablet 2  . B-D INS SYR ULTRAFINE 1CC/30G 30G X 1/2" 1 ML MISC USE TO INJECT INSULIN DAILY 100 each 2  . betamethasone valerate (VALISONE) 0.1 % cream APPLY TOPICALLY 2 (TWO) TIMES DAILY. 30 g 1  . ferrous sulfate 325 (65 FE) MG tablet Take 1 tablet (325 mg total) by mouth daily with breakfast. 30 tablet 5  . gabapentin (NEURONTIN) 300 MG capsule Take 1 capsule (300 mg total) by mouth 2 (two) times daily. 60 capsule 1  . glucose blood test strip Use to check CBG TID 100 each 5  . insulin glargine (LANTUS) 100 UNIT/ML injection Inject 0.4 mLs (40 Units total) into the skin daily. 10 mL 3  . Lancets (AURORA LANCET THIN 123456) MISC 1 application by Does not apply route 3 (three) times daily. 100 each 5  . losartan-hydrochlorothiazide (HYZAAR) 100-25 MG tablet TAKE 1 TABLET BY MOUTH DAILY. 90 tablet 1  . lovastatin (MEVACOR) 40 MG tablet TAKE 1 TABLET (40 MG TOTAL) BY MOUTH AT BEDTIME. 90 tablet 2  . metFORMIN (GLUCOPHAGE) 1000 MG tablet TAKE 1 TABLET (1,000 MG TOTAL) BY MOUTH 2 (TWO) TIMES DAILY WITH A MEAL. 180 tablet 2  . naproxen (NAPROSYN) 500 MG tablet TAKE 1 TABLET (500 MG TOTAL) BY MOUTH 2 (TWO) TIMES DAILY AS NEEDED. (Patient not taking: Reported on 12/28/2015) 60 tablet 3   No current facility-administered medications on file prior to visit.    Past  Medical History:  Diagnosis Date  . Arthritis   . Diabetes mellitus   . Ganglion cyst of wrist 10/14/2013  . Hypertension   . KNEE MENISCUS INJURY, UNSPECIFIED 07/12/2006   Qualifier: History of  By: Carmie End MD, Erin       Review of Systems  Respiratory: Negative.   Cardiovascular: Negative.   Gastrointestinal: Negative.   Genitourinary: Negative.   Neurological: Negative.   All other systems reviewed and are negative.  Vitals:   04/28/16 0834  BP: 132/68  Pulse: 99  Temp: 98.4 F (36.9 C)  TempSrc: Oral  Weight: 195 lb (88.5 kg)  Height: 5\' 7"  (1.702 m)       Objective:   Physical Exam  Constitutional: She is oriented to person, place, and time. She appears well-developed. No distress.  Cardiovascular: Normal rate, regular rhythm, normal heart sounds and intact distal pulses.   No murmur heard. Pulmonary/Chest: Effort normal and breath sounds normal. No respiratory distress. She has no wheezes.  Abdominal: Soft. Bowel sounds are normal. She exhibits no distension and no mass. There is no tenderness.  Musculoskeletal: Normal range of motion. She exhibits no edema.  Neurological: She is alert and oriented to person, place, and time.  Nursing note and vitals reviewed.      Assessment:     DM2 HTN HLD Joint pain    Plan:  Check problem list.     For health maintenance, flu shot given today.

## 2016-04-28 NOTE — Patient Instructions (Signed)
It was nice seeing you today. Your A1C improved a lot. Continue current dose of Metformin and Insulin. Continue home CBG check. I will call you with lab results. If CBG is less than 70 or you are having symptoms of low glucose please hold your insulin and give me a call.

## 2016-04-28 NOTE — Assessment & Plan Note (Signed)
Stable on current regimen   

## 2016-04-28 NOTE — Assessment & Plan Note (Addendum)
BP looks good today. No change in regimen. I refilled her meds.

## 2016-04-28 NOTE — Assessment & Plan Note (Signed)
Improved from last visit. Naproxen refilled. Continue home exercise. F/U soon if worsening.

## 2016-05-01 ENCOUNTER — Telehealth: Payer: Self-pay | Admitting: Family Medicine

## 2016-05-01 NOTE — Telephone Encounter (Signed)
Message left to call back.   When she calls please inform her that all test results are normal. F/U as planned.

## 2016-05-04 ENCOUNTER — Telehealth: Payer: Self-pay | Admitting: *Deleted

## 2016-05-04 NOTE — Telephone Encounter (Signed)
Pt called back and was informed that her test results were normal. Delray Alt, CMA

## 2016-06-03 ENCOUNTER — Ambulatory Visit (HOSPITAL_COMMUNITY)
Admission: EM | Admit: 2016-06-03 | Discharge: 2016-06-03 | Disposition: A | Discharge status: Home / Self Care | Payer: Commercial Managed Care - PPO | Attending: Family Medicine | Admitting: Family Medicine

## 2016-06-03 ENCOUNTER — Encounter (HOSPITAL_COMMUNITY): Payer: Self-pay

## 2016-06-03 DIAGNOSIS — L0292 Furuncle, unspecified: Secondary | ICD-10-CM | POA: Diagnosis not present

## 2016-06-03 DIAGNOSIS — R05 Cough: Secondary | ICD-10-CM

## 2016-06-03 DIAGNOSIS — R059 Cough, unspecified: Secondary | ICD-10-CM

## 2016-06-03 MED ORDER — DOXYCYCLINE HYCLATE 100 MG PO TABS: 100.0000 mg | ORAL_TABLET | Freq: Two times a day (BID) | ORAL | 1 refills | Status: DC

## 2016-06-03 MED ORDER — HYDROCODONE-HOMATROPINE 5-1.5 MG/5ML PO SYRP: 5.0000 mL | ORAL_SOLUTION | Freq: Four times a day (QID) | ORAL | 0 refills | Status: DC | PRN

## 2016-06-03 NOTE — ED Provider Notes (Signed)
[facility]    CSN: [id number] Arrival date & time: 06/03/16  1330     History   Chief Complaint Chief Complaint  Patient presents with  . Cough    HPI Kathy Duncan is a 61 y.o. female.   This is 61 year old woman who presents with 3 weeks of cough, nonproductive, and 2 weeks of recurrent boils in her right groin, left breast, and left upper abdominal wall.  Patient's had no fever during this time. The boils have drained a small amount. They're minimally tender.      Past Medical History:  Diagnosis Date  . Arthritis   . Diabetes mellitus   . Ganglion cyst of wrist 10/14/2013  . Hypertension   . KNEE MENISCUS INJURY, UNSPECIFIED 07/12/2006   Qualifier: History of  By: Carmie End MD, Erin      Patient Active Problem List   Diagnosis Date Noted  . Joint pain 11/23/2015  . Diabetes mellitus type 2, controlled ([place]) 07/09/2015  . Loss of weight 07/09/2015  . Diabetic neuropathy ([place]) 09/30/2012  . Atypical ductal hyperplasia of breast 12/15/2010  . Hyperlipidemia 12/28/2006  . CARPAL TUNNEL SYNDROME, BILATERAL 12/28/2006  . OBESITY, NOS 07/12/2006  . Anemia 07/12/2006  . HYPERTENSION, BENIGN SYSTEMIC 07/12/2006    Past Surgical History:  Procedure Laterality Date  . ABDOMINAL HYSTERECTOMY     has ovaries  . BREAST EXCISIONAL BIOPSY  May 2011    OB History    No data available       Home Medications    Prior to Admission medications   Medication Sig Start Date End Date Taking? Authorizing Provider  aspirin EC 81 MG EC tablet Take 1 tablet (81 mg total) by mouth daily. 08/18/10  Yes Katherina Mires, MD  ferrous sulfate 325 (65 FE) MG tablet Take 1 tablet (325 mg total) by mouth daily with breakfast. 09/12/11  Yes Katherina Mires, MD  gabapentin (NEURONTIN) 300 MG capsule Take 1 capsule (300 mg total) by mouth 2 (two) times daily. 11/23/15  Yes Kinnie Feil, MD  insulin glargine (LANTUS) 100 UNIT/ML injection Inject 0.4 mLs (40 Units total) into  the skin daily. 11/10/15  Yes Kinnie Feil, MD  losartan-hydrochlorothiazide (HYZAAR) 100-25 MG tablet Take 1 tablet by mouth daily. 04/28/16  Yes Kinnie Feil, MD  lovastatin (MEVACOR) 40 MG tablet TAKE 1 TABLET (40 MG TOTAL) BY MOUTH AT BEDTIME. 10/21/15  Yes Kinnie Feil, MD  metFORMIN (GLUCOPHAGE) 1000 MG tablet TAKE 1 TABLET (1,000 MG TOTAL) BY MOUTH 2 (TWO) TIMES DAILY WITH A MEAL. 04/28/16  Yes Kinnie Feil, MD  B-D INS SYR ULTRAFINE 1CC/30G 30G X 1/2" 1 ML MISC USE TO INJECT INSULIN DAILY 12/08/15   Dickie La, MD  doxycycline (VIBRA-TABS) 100 MG tablet Take 1 tablet (100 mg total) by mouth 2 (two) times daily. 06/03/16   Robyn Haber, MD  glucose blood test strip Use to check CBG TID 12/28/15   Kinnie Feil, MD  HYDROcodone-homatropine (HYDROMET) 5-1.5 MG/5ML syrup Take 5 mLs by mouth every 6 (six) hours as needed for cough. 06/03/16   Robyn Haber, MD  Lancets (AURORA LANCET THIN 123456) MISC 1 application by Does not apply route 3 (three) times daily. 12/28/15   Kinnie Feil, MD  naproxen (NAPROSYN) 500 MG tablet Take 1 tablet (500 mg total) by mouth 2 (two) times daily as needed. 04/28/16   Kinnie Feil, MD    Family History Family History  Problem  Relation Age of Onset  . Heart disease Mother   . Stroke Father     Social History Social History  Substance Use Topics  . Smoking status: Passive Smoke Exposure - Never Smoker  . Smokeless tobacco: Never Used  . Alcohol use No     Allergies   Ace inhibitors   Review of Systems Review of Systems  Constitutional: Negative.   HENT: Negative.   Respiratory: Positive for cough. Negative for chest tightness and shortness of breath.   Cardiovascular: Negative.   Skin: Positive for rash.     Physical Exam Triage Vital Signs ED Triage Vitals [06/03/16 1538]  Enc Vitals Group     BP 150/90     Pulse Rate 98     Resp 18     Temp 97.9 F (36.6 C)     Temp Source Oral     SpO2 98 %     Weight       Height      Head Circumference      Peak Flow      Pain Score      Pain Loc      Pain Edu?      Excl. in [place]?    No data found.   Updated Vital Signs BP 150/90 (BP Location: Right Arm)   Pulse 98   Temp 97.9 F (36.6 C) (Oral)   Resp 18   SpO2 98%    Physical Exam  Constitutional: She is oriented to person, place, and time. She appears well-developed and well-nourished.  HENT:  Head: Normocephalic.  Right Ear: External ear normal.  Left Ear: External ear normal.  Mouth/Throat: Oropharynx is clear and moist.  Eyes: Conjunctivae and EOM are normal. Pupils are equal, round, and reactive to light.  Neck: Normal range of motion. Neck supple.  Cardiovascular: Normal rate, regular rhythm and normal heart sounds.   Pulmonary/Chest: Effort normal and breath sounds normal.  Musculoskeletal: Normal range of motion.  Neurological: She is alert and oriented to person, place, and time.  Skin: Skin is warm and dry.  Multiple half centimeter furuncles located in the right inguinal crease, left inferior breast, and left upper abdominal wall. They're all in different stages of healing.  Nursing note and vitals reviewed.    UC Treatments / Results  Labs (all labs ordered are listed, but only abnormal results are displayed) Labs Reviewed - No data to display  EKG  EKG Interpretation None       Radiology No results found.  Procedures Procedures (including critical care time)  Medications Ordered in UC Medications - No data to display   Initial Impression / Assessment and Plan / UC Course  I have reviewed the triage vital signs and the nursing notes.  Pertinent labs & imaging results that were available during my care of the patient were reviewed by me and considered in my medical decision making (see chart for details).     Final Clinical Impressions(s) / UC Diagnoses   Final diagnoses:  Cough  Furuncles    New Prescriptions New Prescriptions   DOXYCYCLINE  (VIBRA-TABS) 100 MG TABLET    Take 1 tablet (100 mg total) by mouth 2 (two) times daily.   HYDROCODONE-HOMATROPINE (HYDROMET) 5-1.5 MG/5ML SYRUP    Take 5 mLs by mouth every 6 (six) hours as needed for cough.     Robyn Haber, MD 06/03/16 [phone]

## 2016-06-03 NOTE — ED Triage Notes (Signed)
Patient presents with cough x3 weeks and boils on right lower abdomen and thigh x2 weeks.

## 2016-06-26 ENCOUNTER — Encounter: Payer: Self-pay | Admitting: Podiatry

## 2016-06-26 ENCOUNTER — Ambulatory Visit (INDEPENDENT_AMBULATORY_CARE_PROVIDER_SITE_OTHER): Payer: Commercial Managed Care - PPO

## 2016-06-26 ENCOUNTER — Ambulatory Visit (INDEPENDENT_AMBULATORY_CARE_PROVIDER_SITE_OTHER): Payer: Commercial Managed Care - PPO | Admitting: Podiatry

## 2016-06-26 VITALS — BP 151/77 | HR 73 | Resp 16 | Ht 67.0 in | Wt 195.0 lb

## 2016-06-26 DIAGNOSIS — M722 Plantar fascial fibromatosis: Secondary | ICD-10-CM | POA: Diagnosis not present

## 2016-06-26 DIAGNOSIS — M79672 Pain in left foot: Secondary | ICD-10-CM | POA: Diagnosis not present

## 2016-06-26 DIAGNOSIS — M79671 Pain in right foot: Secondary | ICD-10-CM | POA: Diagnosis not present

## 2016-06-26 MED ORDER — DICLOFENAC SODIUM 75 MG PO TBEC
75.0000 mg | DELAYED_RELEASE_TABLET | Freq: Two times a day (BID) | ORAL | 2 refills | Status: DC
Start: 2016-06-26 — End: 2016-10-03

## 2016-06-26 MED ORDER — TRIAMCINOLONE ACETONIDE 10 MG/ML IJ SUSP
10.0000 mg | Freq: Once | INTRAMUSCULAR | Status: AC
  Administered 2016-06-26: 10 mg

## 2016-06-26 NOTE — Patient Instructions (Signed)

## 2016-06-26 NOTE — Progress Notes (Signed)
Subjective:     Patient ID: Kathy Duncan, female   DOB: 08-Oct-1955, 61 y.o.   MRN: [id number]  HPI patient states my heel has really been hurting me right over left and making it difficult for walk. Patient states it's been present for several months and has gradually gotten worse over that time   Review of Systems  All other systems reviewed and are negative.      Objective:   Physical Exam  Constitutional: She is oriented to person, place, and time.  Cardiovascular: Intact distal pulses.   Musculoskeletal: Normal range of motion.  Neurological: She is oriented to person, place, and time.  Skin: Skin is warm.  Nursing note and vitals reviewed.  neurovascular status intact muscle strength adequate range of motion within normal limits with patient found to have exquisite discomfort right heel over left heel with inflammation fluid around the medial band. Moderate flatfoot deformity is noted and patient's found have good digital perfusion and is well oriented 3     Assessment:     Acute plantar fasciitis right over left with inflammation fluid around the medial band    Plan:     H&P x-rays reviewed and structure discussed. Today I injected the right plantar fashion 3 Milligan Kenalog 5 mill grams Xylocaine and applied fascial brace placed on diclofenac 75 mg twice a day and gave instructions on physical therapy. Reappoint to recheck in the next several weeks  X-ray report indicate spur formation with no indications of stress fracture or arthritis

## 2016-06-26 NOTE — Progress Notes (Signed)
   Subjective:    Patient ID: Kathy Duncan, female    DOB: Sep 02, 1955, 61 y.o.   MRN: [id number]  HPI Chief Complaint  Patient presents with  . Foot Pain    Bilateral; arch-medial side & bottom of heel; pt stated, "Right foot hurts more than left foot"; x2 months; Pt Diabetic Type 2; Sugar=175 this am; A1C=6.2      Review of Systems  Musculoskeletal: Positive for arthralgias and gait problem.  All other systems reviewed and are negative.      Objective:   Physical Exam        Assessment & Plan:

## 2016-07-02 ENCOUNTER — Other Ambulatory Visit: Payer: Self-pay | Admitting: Family Medicine

## 2016-07-07 ENCOUNTER — Other Ambulatory Visit: Payer: Self-pay | Admitting: Family Medicine

## 2016-07-10 ENCOUNTER — Encounter: Payer: Self-pay | Admitting: Podiatry

## 2016-07-10 ENCOUNTER — Ambulatory Visit (INDEPENDENT_AMBULATORY_CARE_PROVIDER_SITE_OTHER): Payer: Commercial Managed Care - PPO | Admitting: Podiatry

## 2016-07-10 DIAGNOSIS — M722 Plantar fascial fibromatosis: Secondary | ICD-10-CM | POA: Diagnosis not present

## 2016-07-12 ENCOUNTER — Ambulatory Visit: Payer: Commercial Managed Care - PPO | Admitting: Podiatry

## 2016-07-16 NOTE — Progress Notes (Signed)
   Subjective:  Patient presents today for follow-up treatment and evaluation of plantar fasciitis to the right foot. Patient states that she's doing great. Patient denies pain. Patient was last seen by Dr. Paulla Dolly on 06/26/2016    Objective/Physical Exam General: The patient is alert and oriented x3 in no acute distress.  Dermatology: Skin is warm, dry and supple bilateral lower extremities. Negative for open lesions or macerations.  Vascular: Palpable pedal pulses bilaterally. No edema or erythema noted. Capillary refill within normal limits.  Neurological: Epicritic and protective threshold grossly intact bilaterally.   Musculoskeletal Exam: Range of motion within normal limits to all pedal and ankle joints bilateral. Muscle strength 5/5 in all groups bilateral.   Radiographic Exam:  Normal osseous mineralization. Joint spaces preserved. No fracture/dislocation/boney destruction.    Assessment: #1 plantar fasciitis right-resolved   Plan of Care:  #1 Patient was evaluated. #2 discussed conservative modalities to prevent recurrent symptoms including appropriate shoe gear and stretching exercises. #3 return to clinic when necessary   Edrick Kins, DPM [facility]  Dr. Edrick Kins, [address]                                        [place], [place] [address]                Office [phone]  Fax [phone]

## 2016-07-25 DIAGNOSIS — Z1211 Encounter for screening for malignant neoplasm of colon: Secondary | ICD-10-CM | POA: Diagnosis not present

## 2016-07-25 DIAGNOSIS — Z7689 Persons encountering health services in other specified circumstances: Secondary | ICD-10-CM | POA: Diagnosis not present

## 2016-07-25 DIAGNOSIS — R05 Cough: Secondary | ICD-10-CM | POA: Diagnosis not present

## 2016-09-01 DIAGNOSIS — K635 Polyp of colon: Secondary | ICD-10-CM | POA: Diagnosis not present

## 2016-09-01 DIAGNOSIS — D120 Benign neoplasm of cecum: Secondary | ICD-10-CM | POA: Diagnosis not present

## 2016-09-01 DIAGNOSIS — Z1211 Encounter for screening for malignant neoplasm of colon: Secondary | ICD-10-CM | POA: Diagnosis not present

## 2016-09-01 DIAGNOSIS — K6389 Other specified diseases of intestine: Secondary | ICD-10-CM | POA: Diagnosis not present

## 2016-09-05 ENCOUNTER — Telehealth: Payer: Self-pay | Admitting: Family Medicine

## 2016-09-05 NOTE — Telephone Encounter (Signed)
I called to reschedule patient her May 11 appointment since I will not be in the clinic that day. Message left for her to call me back.  Please help patient reschedule her appointment whenever she calls back.

## 2016-09-05 NOTE — Telephone Encounter (Signed)
Pt rescheduled for 10-03-16. ep

## 2016-09-22 ENCOUNTER — Ambulatory Visit: Payer: Commercial Managed Care - PPO | Admitting: Family Medicine

## 2016-10-03 ENCOUNTER — Ambulatory Visit (INDEPENDENT_AMBULATORY_CARE_PROVIDER_SITE_OTHER): Payer: Commercial Managed Care - PPO | Admitting: Family Medicine

## 2016-10-03 ENCOUNTER — Other Ambulatory Visit: Payer: Self-pay | Admitting: *Deleted

## 2016-10-03 ENCOUNTER — Encounter: Payer: Self-pay | Admitting: Family Medicine

## 2016-10-03 VITALS — BP 122/78 | HR 78 | Temp 98.1°F | Ht 67.0 in | Wt 194.0 lb

## 2016-10-03 DIAGNOSIS — M25511 Pain in right shoulder: Secondary | ICD-10-CM

## 2016-10-03 DIAGNOSIS — E118 Type 2 diabetes mellitus with unspecified complications: Secondary | ICD-10-CM

## 2016-10-03 DIAGNOSIS — M25512 Pain in left shoulder: Secondary | ICD-10-CM

## 2016-10-03 DIAGNOSIS — E1165 Type 2 diabetes mellitus with hyperglycemia: Secondary | ICD-10-CM | POA: Diagnosis not present

## 2016-10-03 DIAGNOSIS — R5383 Other fatigue: Secondary | ICD-10-CM | POA: Insufficient documentation

## 2016-10-03 DIAGNOSIS — Z794 Long term (current) use of insulin: Secondary | ICD-10-CM

## 2016-10-03 DIAGNOSIS — I10 Essential (primary) hypertension: Secondary | ICD-10-CM

## 2016-10-03 DIAGNOSIS — IMO0002 Reserved for concepts with insufficient information to code with codable children: Secondary | ICD-10-CM

## 2016-10-03 LAB — POCT GLYCOSYLATED HEMOGLOBIN (HGB A1C): Hemoglobin A1C: 7.3

## 2016-10-03 MED ORDER — INSULIN GLARGINE 100 UNIT/ML ~~LOC~~ SOLN: SUBCUTANEOUS | 3 refills | Status: DC

## 2016-10-03 MED ORDER — DICLOFENAC SODIUM 75 MG PO TBEC: 75.0000 mg | DELAYED_RELEASE_TABLET | Freq: Two times a day (BID) | ORAL | 2 refills | Status: DC

## 2016-10-03 NOTE — Progress Notes (Signed)
Subjective:     Patient ID: Kathy Duncan, female   DOB: [birth year], 61 y.o.   MRN: [id number]  Diabetes  She presents for her follow-up diabetic visit. She has type 2 diabetes mellitus. Her disease course has been stable. There are no hypoglycemic associated symptoms. Pertinent negatives for hypoglycemia include no headaches. There are no diabetic associated symptoms. Pertinent negatives for diabetes include no chest pain. Risk factors for coronary artery disease include diabetes mellitus and hypertension. Current diabetic treatment includes diet, insulin injections and oral agent (monotherapy). She is compliant with treatment all of the time. She participates in exercise intermittently (She has not been getting adequate exercise lately). Her breakfast blood glucose range is generally 90-110 mg/dl. Her lunch blood glucose range is generally 110-130 mg/dl. An ACE inhibitor/angiotensin II receptor blocker is being taken.  Hypertension  This is a chronic problem. The problem is unchanged. The problem is controlled. Pertinent negatives include no anxiety, chest pain, headaches, neck pain or palpitations. The current treatment provides significant improvement. There are no compliance problems.   Here for routine check-up. Joint pain:C/O B/L shoulder pain which has been ongoing for years due to her nature of work. She used to lift heavy objects in the past but has since refrained from doing so. Naproxen has not helped a whole lot so she discontinued it. Denies joint swelling or arm weakness. Also some knee pain. Fatigue: Feeling tired lately. HM: She had recent Colonoscopy with Dr. Collene Mares in April.  Current Outpatient Prescriptions on File Prior to Visit  Medication Sig Dispense Refill  . aspirin EC 81 MG EC tablet Take 1 tablet (81 mg total) by mouth daily. 150 tablet 2  . B-D INS SYR ULTRAFINE 1CC/30G 30G X 1/2" 1 ML MISC USE TO INJECT INSULIN DAILY 100 each 2  . diclofenac (VOLTAREN) 75 MG EC tablet  Take 1 tablet (75 mg total) by mouth 2 (two) times daily. 50 tablet 2  . doxycycline (VIBRA-TABS) 100 MG tablet Take 1 tablet (100 mg total) by mouth 2 (two) times daily. 42 tablet 1  . ferrous sulfate 325 (65 FE) MG tablet Take 1 tablet (325 mg total) by mouth daily with breakfast. 30 tablet 5  . gabapentin (NEURONTIN) 300 MG capsule Take 1 capsule (300 mg total) by mouth 2 (two) times daily. 60 capsule 1  . glucose blood test strip Use to check CBG TID 100 each 5  . HYDROcodone-homatropine (HYDROMET) 5-1.5 MG/5ML syrup Take 5 mLs by mouth every 6 (six) hours as needed for cough. 120 mL 0  . Lancets (AURORA LANCET THIN 41Y) MISC 1 application by Does not apply route 3 (three) times daily. 100 each 5  . LANTUS 100 UNIT/ML injection INJECT 40 UNITS INTO THE SKIN DAILY 10 mL 3  . losartan-hydrochlorothiazide (HYZAAR) 100-25 MG tablet Take 1 tablet by mouth daily. 90 tablet 2  . lovastatin (MEVACOR) 40 MG tablet TAKE 1 TABLET (40 MG TOTAL) BY MOUTH AT BEDTIME. 90 tablet 2  . metFORMIN (GLUCOPHAGE) 1000 MG tablet TAKE 1 TABLET (1,000 MG TOTAL) BY MOUTH 2 (TWO) TIMES DAILY WITH A MEAL. 180 tablet 2  . naproxen (NAPROSYN) 500 MG tablet Take 1 tablet (500 mg total) by mouth 2 (two) times daily as needed. 60 tablet 3   No current facility-administered medications on file prior to visit.    Past Medical History:  Diagnosis Date  . Arthritis   . Diabetes mellitus   . Ganglion cyst of wrist 10/14/2013  . Hypertension   .  KNEE MENISCUS INJURY, UNSPECIFIED 07/12/2006   Qualifier: History of  By: Carmie End MD, Erin       Review of Systems  Constitutional: Negative.   Respiratory: Negative.   Cardiovascular: Negative for chest pain and palpitations.  Gastrointestinal: Negative.   Musculoskeletal: Positive for arthralgias. Negative for joint swelling and neck pain.  Neurological: Negative.  Negative for headaches.       Objective:   Physical Exam  Constitutional: She is oriented to person, place, and  time. She appears well-developed. No distress.  Cardiovascular: Normal rate, regular rhythm and normal heart sounds.   No murmur heard. Pulmonary/Chest: Effort normal and breath sounds normal. No respiratory distress. She has no wheezes.  Abdominal: Soft. Bowel sounds are normal. She exhibits no distension and no mass. There is no tenderness.  Musculoskeletal: Normal range of motion. She exhibits no edema.       Right shoulder: Normal.       Left shoulder: Normal.  Neurological: She is alert and oriented to person, place, and time. No cranial nerve deficit.  Nursing note and vitals reviewed.      Assessment:     DM2 HTN Shoulder pain: B/L Fatigue Health maintenance    Plan:     Check problem list.  Note: She signed release of information for her colonoscopy report today.

## 2016-10-03 NOTE — Assessment & Plan Note (Signed)
A1C worsened a bit but still okay. She stated this is due to exercise non-compliance. No adjustment made to her lantus 40 units qd, Metformin 1000 mg BID. Diet and exercise compliance encouraged. F/U in 3-4 months.

## 2016-10-03 NOTE — Assessment & Plan Note (Signed)
Recent TSH was normal. Hx of Anemia. She is compliance with iron supplement. Recheck CBC today. Graded exercise recommended.

## 2016-10-03 NOTE — Patient Instructions (Signed)
It was nice seeing you today. Your A1C went up but still okay. Please improve on your diet and exercise. Continue same dose of your medications. I will like to see you back in 3-4 months for recheck. I will refill your medications. Call me if you have other concerns.

## 2016-10-03 NOTE — Assessment & Plan Note (Signed)
BP looks good. Continue current dose of Hyzaar 100/25 mg qd. Continue home BP monitoring.

## 2016-10-03 NOTE — Assessment & Plan Note (Signed)
D/C Naproxen. I will avoid Opioid at this time. Retry Diclofenac. Consider imaging if no improvement. In the mean time home exercise. F/U as needed.

## 2016-10-04 LAB — CBC
Hematocrit: 35.9 % (ref 34.0–46.6)
Hemoglobin: 11.4 g/dL (ref 11.1–15.9)
MCH: 24.8 pg — ABNORMAL LOW (ref 26.6–33.0)
MCHC: 31.8 g/dL (ref 31.5–35.7)
MCV: 78 fL — ABNORMAL LOW (ref 79–97)
Platelets: 292 10*3/uL (ref 150–379)
RBC: 4.6 x10E6/uL (ref 3.77–5.28)
RDW: 15.6 % — ABNORMAL HIGH (ref 12.3–15.4)
WBC: 3.9 10*3/uL (ref 3.4–10.8)

## 2016-10-05 ENCOUNTER — Other Ambulatory Visit: Payer: Self-pay | Admitting: Family Medicine

## 2016-10-05 ENCOUNTER — Telehealth: Payer: Self-pay | Admitting: Family Medicine

## 2016-10-05 NOTE — Telephone Encounter (Signed)
Attempted to contact patient about result. HIPPA compliant message left for her to call me back.  Note whenever she calls back let her know that her labs is neg for anemia.

## 2016-10-05 NOTE — Telephone Encounter (Signed)
LM for patient ok per DPR in chart.  Kathy Duncan,CMA  

## 2016-10-06 NOTE — Telephone Encounter (Signed)
Patient called back and is aware of results. Jazmin Hartsell,CMA

## 2016-10-23 ENCOUNTER — Other Ambulatory Visit: Payer: Self-pay | Admitting: Family Medicine

## 2016-10-23 DIAGNOSIS — Z1231 Encounter for screening mammogram for malignant neoplasm of breast: Secondary | ICD-10-CM

## 2016-11-22 ENCOUNTER — Ambulatory Visit
Admission: RE | Admit: 2016-11-22 | Discharge: 2016-11-22 | Disposition: A | Discharge status: Home / Self Care | Payer: Commercial Managed Care - PPO | Source: Ambulatory Visit | Attending: Family Medicine | Admitting: Family Medicine

## 2016-11-22 DIAGNOSIS — Z1231 Encounter for screening mammogram for malignant neoplasm of breast: Secondary | ICD-10-CM

## 2016-12-05 DIAGNOSIS — M7542 Impingement syndrome of left shoulder: Secondary | ICD-10-CM | POA: Diagnosis not present

## 2016-12-05 DIAGNOSIS — M25512 Pain in left shoulder: Secondary | ICD-10-CM | POA: Diagnosis not present

## 2016-12-05 DIAGNOSIS — G8929 Other chronic pain: Secondary | ICD-10-CM | POA: Diagnosis not present

## 2016-12-07 DIAGNOSIS — M25562 Pain in left knee: Secondary | ICD-10-CM | POA: Diagnosis not present

## 2016-12-07 DIAGNOSIS — M170 Bilateral primary osteoarthritis of knee: Secondary | ICD-10-CM | POA: Diagnosis not present

## 2016-12-07 DIAGNOSIS — M25561 Pain in right knee: Secondary | ICD-10-CM | POA: Diagnosis not present

## 2016-12-24 ENCOUNTER — Other Ambulatory Visit: Payer: Self-pay | Admitting: Family Medicine

## 2016-12-29 ENCOUNTER — Other Ambulatory Visit: Payer: Self-pay | Admitting: Family Medicine

## 2016-12-29 DIAGNOSIS — E118 Type 2 diabetes mellitus with unspecified complications: Secondary | ICD-10-CM

## 2017-01-14 ENCOUNTER — Other Ambulatory Visit: Payer: Self-pay | Admitting: Family Medicine

## 2017-01-25 DIAGNOSIS — H3562 Retinal hemorrhage, left eye: Secondary | ICD-10-CM | POA: Diagnosis not present

## 2017-01-25 DIAGNOSIS — H35043 Retinal micro-aneurysms, unspecified, bilateral: Secondary | ICD-10-CM | POA: Diagnosis not present

## 2017-01-25 DIAGNOSIS — E103292 Type 1 diabetes mellitus with mild nonproliferative diabetic retinopathy without macular edema, left eye: Secondary | ICD-10-CM | POA: Diagnosis not present

## 2017-02-01 DIAGNOSIS — M170 Bilateral primary osteoarthritis of knee: Secondary | ICD-10-CM | POA: Diagnosis not present

## 2017-02-02 ENCOUNTER — Ambulatory Visit (INDEPENDENT_AMBULATORY_CARE_PROVIDER_SITE_OTHER): Payer: Commercial Managed Care - PPO | Admitting: Family Medicine

## 2017-02-02 ENCOUNTER — Ambulatory Visit (HOSPITAL_COMMUNITY)
Admission: RE | Admit: 2017-02-02 | Discharge: 2017-02-02 | Disposition: A | Discharge status: Home / Self Care | Payer: Commercial Managed Care - PPO | Source: Ambulatory Visit | Attending: Family Medicine | Admitting: Family Medicine

## 2017-02-02 ENCOUNTER — Encounter: Payer: Self-pay | Admitting: Family Medicine

## 2017-02-02 VITALS — BP 138/80 | HR 77 | Temp 98.3°F | Ht 67.0 in | Wt 203.0 lb

## 2017-02-02 DIAGNOSIS — I10 Essential (primary) hypertension: Secondary | ICD-10-CM

## 2017-02-02 DIAGNOSIS — E118 Type 2 diabetes mellitus with unspecified complications: Secondary | ICD-10-CM | POA: Diagnosis not present

## 2017-02-02 DIAGNOSIS — Z23 Encounter for immunization: Secondary | ICD-10-CM | POA: Diagnosis not present

## 2017-02-02 DIAGNOSIS — Z794 Long term (current) use of insulin: Secondary | ICD-10-CM

## 2017-02-02 DIAGNOSIS — M25562 Pain in left knee: Secondary | ICD-10-CM | POA: Diagnosis not present

## 2017-02-02 DIAGNOSIS — L600 Ingrowing nail: Secondary | ICD-10-CM

## 2017-02-02 DIAGNOSIS — E785 Hyperlipidemia, unspecified: Secondary | ICD-10-CM

## 2017-02-02 LAB — POCT GLYCOSYLATED HEMOGLOBIN (HGB A1C): Hemoglobin A1C: 7.1

## 2017-02-02 NOTE — Patient Instructions (Signed)
Ingrown Hair An ingrown hair is a hair that curls and re-enters the skin instead of growing straight out of the skin. An ingrown hair can develop in any part of the skin that hair is removed from. An ingrown hair may cause small pockets of infection. What are the causes? An ingrown hair can be caused by:  Shaving.  Tweezing.  Waxing.  Using a hair removal cream.  What increases the risk? Ingrown hairs are more likely to develop in people who have curly hair. What are the signs or symptoms? Symptoms of an ingrown hair may include:  Small bumps on the skin. The bumps may be filled with pus.  Pain.  Itching.  How is this diagnosed? An ingrown hair is diagnosed with a skin exam. How is this treated? Treatment is often not needed unless the ingrown hair has caused an infection. Treatment may involve:  Applying prescription creams to the skin. This can help with inflammation.  Applying warm compresses to the skin. This can help soften the skin.  Taking antibiotic medicine. An antibiotic may be prescribed if the infection is severe.  Follow these instructions at home:  Do not shave irritated areas of skin. You may start shaving these areas again once the irritation has gone away.  Take, apply, or use over-the-counter and prescription medicines only as told by your health care provider. This includes any prescription creams.  If you were prescribed an antibiotic medicine, take it as told by your health care provider. Do not stop taking the antibiotic even if your condition improves.  To help remove ingrown hairs on your face, you may use a facial sponge in a gentle circular motion.  If directed, apply heat to the affected area. Use the heat source that your health care provider recommends, such as a moist heat pack or a heating pad. ? Place a towel between your skin and the heat source. ? Leave the heat on for 20-30 minutes. ? Remove the heat if your skin turns bright red.  This is especially important if you are unable to feel pain, heat, or cold. You may have a greater risk of getting burned. How is this prevented?  Shower before shaving.  Wrap areas that you are going to shave in warm, moist wraps for several minutes before shaving. The warmth and moisture helps to soften the hairs and makes ingrown hairs less likely.  Use thick shaving gels.  Use a razor that cuts hair slightly above your skin. Or, use an electric shaver with a long shave setting.  Shave in the direction of hair growth.  Avoid making multiple razor strokes.  Apply a moisturizing lotion after shaving. This information is not intended to replace advice given to you by your health care provider. Make sure you discuss any questions you have with your health care provider. Document Released: 08/07/2000 Document Revised: 11/19/2015 Document Reviewed: 02/19/2015 Elsevier Interactive Patient Education  2018 [employer]  

## 2017-02-02 NOTE — Progress Notes (Signed)
dm

## 2017-02-02 NOTE — Assessment & Plan Note (Signed)
B/L big toes. She will contact her podiatrist's office for nail removal. Good feet hygiene discussed. F/U as needed.

## 2017-02-02 NOTE — Assessment & Plan Note (Addendum)
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 I don't have the xray result from her orthopedic. She presented surgical clearance form for total knee replacement surgery which is an intermediate risk procedure. No cardiac hx, no illicit drug use. She is on Insulin for DM and is pretty well controlled. She recently had CBC checked. Uncertain when result will be scheduled. EKG done today looks good. She is low risk for surgery based on NSQIP ACS risk calculation. She will need Bmet and CBC few days to surgery. Otherwise she is cleared for surgery.

## 2017-02-02 NOTE — Assessment & Plan Note (Signed)
Patient is compliant with meds. Monitor for now. Repeat FLP in the future.

## 2017-02-02 NOTE — Assessment & Plan Note (Signed)
A1C improved a bit from 7.3 to 7.1. Continue current regimen. F/U in 3 months.

## 2017-02-02 NOTE — Progress Notes (Addendum)
Subjective:     Patient ID: Kathy Duncan, female   DOB: [birth year], 61 y.o.   MRN: [id number]  HPI DM2/HTN/HLD:She is here for follow-up. Compliant with all meds. She is on Lantus 40 units qd and Metformin 1000 mg BID for DM2. She is also taking her statin and HTNs agents. Right Knee pain:Pain for more than 1 yr. She was seen at [facility] orthopedic, they took xray and told her she needed knee replacement. Surgery is not yet scheduled, she need medical clearance for it. Toe pain:C/O pain on her big toes B/L which worsens whenever she wears covered shoes. Pain is mostly on the medial aspect of her nail bed. She denies any swelling or redness. No injury to her toes. Medical assessment: She denies chest pain in the past month,no SOB,no leg swelling, no GI or GU symptoms. No neurologic symptoms. Feels well in general. She is compliant with all her meds.   Current Outpatient Prescriptions on File Prior to Visit  Medication Sig Dispense Refill  . aspirin EC 81 MG EC tablet Take 1 tablet (81 mg total) by mouth daily. 150 tablet 2  . B-D INS SYR ULTRAFINE 1CC/30G 30G X 1/2" 1 ML MISC USE TO INJECT INSULIN DAILY 100 each 2  . B-D INS SYR ULTRAFINE 1CC/30G 30G X 1/2" 1 ML MISC USE TO INJECT INSULIN DAILY 100 each 2  . diclofenac (VOLTAREN) 75 MG EC tablet Take 1 tablet (75 mg total) by mouth 2 (two) times daily. 60 tablet 2  . ferrous sulfate 325 (65 FE) MG tablet Take 1 tablet (325 mg total) by mouth daily with breakfast. 30 tablet 5  . gabapentin (NEURONTIN) 300 MG capsule Take 1 capsule (300 mg total) by mouth 2 (two) times daily. 60 capsule 1  . insulin glargine (LANTUS) 100 UNIT/ML injection INJECT 40 UNITS INTO THE SKIN DAILY 40 mL 3  . Lancets (AURORA LANCET THIN 81O) MISC 1 application by Does not apply route 3 (three) times daily. 100 each 5  . losartan-hydrochlorothiazide (HYZAAR) 100-25 MG tablet Take 1 tablet by mouth daily. 90 tablet 2  . lovastatin (MEVACOR) 40 MG tablet TAKE 1 TABLET  (40 MG TOTAL) BY MOUTH AT BEDTIME. 90 tablet 2  . metFORMIN (GLUCOPHAGE) 1000 MG tablet TAKE 1 TABLET (1,000 MG TOTAL) BY MOUTH 2 (TWO) TIMES DAILY WITH A MEAL. 180 tablet 2  . ONE TOUCH ULTRA TEST test strip USE TO CHECK BLOOD SUGAR 3 TIMES DAILY 100 each 4   No current facility-administered medications on file prior to visit.    Past Medical History:  Diagnosis Date  . Arthritis   . Diabetes mellitus   . Ganglion cyst of wrist 10/14/2013  . Hypertension   . KNEE MENISCUS INJURY, UNSPECIFIED 07/12/2006   Qualifier: History of  By: Carmie End MD, Erin     Vitals:   02/02/17 0838  BP: 138/80  Pulse: 77  Temp: 98.3 F (36.8 C)  TempSrc: Oral  SpO2: 98%  Weight: 203 lb (92.1 kg)  Height: 5\' 7"  (1.702 m)      Review of Systems  Respiratory: Negative.   Cardiovascular: Negative.   Gastrointestinal: Negative.   Musculoskeletal: Positive for arthralgias.       Right knee pain. Toenail pain B/L  Neurological: Negative.   All other systems reviewed and are negative.      Objective:   Physical Exam  Constitutional: She is oriented to person, place, and time. She appears well-developed. No distress.  Neck: Neck supple. No thyromegaly  present.  Cardiovascular: Normal rate, regular rhythm and normal heart sounds.   No murmur heard. Pulmonary/Chest: Effort normal and breath sounds normal. No respiratory distress. She has no wheezes.  Abdominal: Soft. Bowel sounds are normal. She exhibits no distension and no mass. There is no tenderness.  Musculoskeletal: Normal range of motion. She exhibits no edema.  Ingrown nail on the medial aspect of her big toes B/L. No swelling or erythema.  Neurological: She is alert and oriented to person, place, and time. No cranial nerve deficit.  Psychiatric: She has a normal mood and affect.  Nursing note and vitals reviewed.      Assessment:     DM2 HTN HLD Knee pain  ingrown toenail    Plan:     Check problem list.

## 2017-02-02 NOTE — Assessment & Plan Note (Signed)
Good control. Continue current regimen. 

## 2017-02-07 ENCOUNTER — Ambulatory Visit: Payer: Self-pay | Admitting: Orthopedic Surgery

## 2017-02-15 ENCOUNTER — Ambulatory Visit (INDEPENDENT_AMBULATORY_CARE_PROVIDER_SITE_OTHER): Payer: Commercial Managed Care - PPO | Admitting: Podiatry

## 2017-02-15 DIAGNOSIS — M722 Plantar fascial fibromatosis: Secondary | ICD-10-CM | POA: Diagnosis not present

## 2017-02-15 DIAGNOSIS — L600 Ingrowing nail: Secondary | ICD-10-CM

## 2017-02-15 NOTE — Progress Notes (Signed)
Subjective:    Patient ID: Kathy Duncan, female   DOB: 61 y.o.   MRN: [id number]   HPI patient states she has a painful ingrown toenail of her right over left big toenail and states her heels have been feeling better with occasional soreness    ROS      Objective:  Physical Exam neurovascular status intact negative Homan sign was noted with good digital perfusion noted and diabetes under excellent control. Right hallux lateral border is incurvated sore and the patient has mild discomfort plantar fascia but improved from previous with mild discomfort left ingrown toenail     Assessment:  Chronic ingrown toenail right hallux lateral border with pain and also mild plantar fasciitis and ingrown toenail left       Plan:   H&P conditions reviewed recommended ingrown toenail correction. I explained procedure and risk and today patient wants procedure understanding risk and I infiltrated 60 Milligan's like Marcaine mixture remove the lateral border exposed matrix and applied phenol 3 applications 30 seconds followed by alcohol lavaged sterile dressing. Gave instructions on soaks and reappoint and discussed plantar fasciitis recommended continued shoe gear modifications and elevation

## 2017-02-15 NOTE — Patient Instructions (Signed)

## 2017-02-19 NOTE — Patient Instructions (Addendum)
Kathy Duncan  02/19/2017   Your procedure is scheduled on: 03/01/17  Report to [facility] Main  Entrance Take [facility]  elevators to 3rd floor to  [place] at     0800 AM.    Call this number if you have problems the morning of surgery [phone]    Remember: ONLY 1 PERSON MAY GO WITH YOU TO SHORT STAY TO GET  READY MORNING OF YOUR SURGERY.  Do not eat food or drink liquids :After Midnight.     Take these medicines the morning of surgery with A SIP OF WATER: NONE DO NOT TAKE ANY ORAL  DIABETIC MEDICATIONS DAY OF YOUR SURGERY                               You may not have any metal on your body including hair pins and              piercings  Do not wear jewelry, make-up, lotions, powders or perfumes, deodorant             Do not wear nail polish.  Do not shave  48 hours prior to surgery.             Do not bring valuables to the hospital. [place].  Contacts, dentures or bridgework may not be worn into surgery.  Leave suitcase in the car. After surgery it may be brought to your room.             Please read over the following fact sheets you were given: _____________________________________________________________________           [facility] - Preparing for Surgery Before surgery, you can play an important role.  Because skin is not sterile, your skin needs to be as free of germs as possible.  You can reduce the number of germs on your skin by washing with CHG (chlorahexidine gluconate) soap before surgery.  CHG is an antiseptic cleaner which kills germs and bonds with the skin to continue killing germs even after washing. Please DO NOT use if you have an allergy to CHG or antibacterial soaps.  If your skin becomes reddened/irritated stop using the CHG and inform your nurse when you arrive at Short Stay. Do not shave (including legs and underarms) for at least 48 hours prior to the first CHG  shower.  You may shave your face/neck. Please follow these instructions carefully:  1.  Shower with CHG Soap the night before surgery and the  morning of Surgery.  2.  If you choose to wash your hair, wash your hair first as usual with your  normal  shampoo.  3.  After you shampoo, rinse your hair and body thoroughly to remove the  shampoo.                           4.  Use CHG as you would any other liquid soap.  You can apply chg directly  to the skin and wash                       Gently with a scrungie or clean washcloth.  5.  Apply the CHG  Soap to your body ONLY FROM THE NECK DOWN.   Do not use on face/ open                           Wound or open sores. Avoid contact with eyes, ears mouth and genitals (private parts).                       Wash face,  Genitals (private parts) with your normal soap.             6.  Wash thoroughly, paying special attention to the area where your surgery  will be performed.  7.  Thoroughly rinse your body with warm water from the neck down.  8.  DO NOT shower/wash with your normal soap after using and rinsing off  the CHG Soap.                9.  Pat yourself dry with a clean towel.            10.  Wear clean pajamas.            11.  Place clean sheets on your bed the night of your first shower and do not  sleep with pets. Day of Surgery : Do not apply any lotions/deodorants the morning of surgery.  Please wear clean clothes to the hospital/surgery center.  FAILURE TO FOLLOW THESE INSTRUCTIONS MAY RESULT IN THE CANCELLATION OF YOUR SURGERY PATIENT SIGNATURE_________________________________  NURSE SIGNATURE__________________________________  ________________________________________________________________________ How to Manage Your Diabetes Before and After Surgery  Why is it important to control my blood sugar before and after surgery? . Improving blood sugar levels before and after surgery helps healing and can limit problems. . A way of improving  blood sugar control is eating a healthy diet by: o  Eating less sugar and carbohydrates o  Increasing activity/exercise o  Talking with your doctor about reaching your blood sugar goals . High blood sugars (greater than 180 mg/dL) can raise your risk of infections and slow your recovery, so you will need to focus on controlling your diabetes during the weeks before surgery. . Make sure that the doctor who takes care of your diabetes knows about your planned surgery including the date and location.  How do I manage my blood sugar before surgery? . Check your blood sugar at least 4 times a day, starting 2 days before surgery, to make sure that the level is not too high or low. o Check your blood sugar the morning of your surgery when you wake up and every 2 hours until you get to the Short Stay unit. . If your blood sugar is less than 70 mg/dL, you will need to treat for low blood sugar: o Do not take insulin. o Treat a low blood sugar (less than 70 mg/dL) with  cup of clear juice (cranberry or apple), 4 glucose tablets, OR glucose gel. o Recheck blood sugar in 15 minutes after treatment (to make sure it is greater than 70 mg/dL). If your blood sugar is not greater than 70 mg/dL on recheck, call [phone] for further instructions. . Report your blood sugar to the short stay nurse when you get to Short Stay.  . If you are admitted to the hospital after surgery: o Your blood sugar will be checked by the staff and you will probably be given insulin after surgery (instead of oral diabetes medicines) to make sure you  have good blood sugar levels. o The goal for blood sugar control after surgery is 80-180 mg/dL.   WHAT DO I DO ABOUT MY DIABETES MEDICATION?  Marland Kitchen Do not take oral diabetes medicines (pills) the morning of surgery.  . THE NIGHT BEFORE SURGERY, take  20   units of  Lantus     insulin.       . THE MORNING OF SURGERY, take   units of    0     insulin.  . The day of surgery, do not  take other diabetes injectables, including Byetta (exenatide), Bydureon (exenatide ER), Victoza (liraglutide), or Trulicity (dulaglutide).  . If your CBG is greater than 220 mg/dL, you may take  of your sliding scale  . (correction) dose of insulin.    Patient Signature:  Date:   Nurse Signature:  Date:   Reviewed and Endorsed by [facility] Patient Education Committee, August 2015  Incentive Spirometer  An incentive spirometer is a tool that can help keep your lungs clear and active. This tool measures how well you are filling your lungs with each breath. Taking long deep breaths may help reverse or decrease the chance of developing breathing (pulmonary) problems (especially infection) following:  A long period of time when you are unable to move or be active. BEFORE THE PROCEDURE   If the spirometer includes an indicator to show your best effort, your nurse or respiratory therapist will set it to a desired goal.  If possible, sit up straight or lean slightly forward. Try not to slouch.  Hold the incentive spirometer in an upright position. INSTRUCTIONS FOR USE  1. Sit on the edge of your bed if possible, or sit up as far as you can in bed or on a chair. 2. Hold the incentive spirometer in an upright position. 3. Breathe out normally. 4. Place the mouthpiece in your mouth and seal your lips tightly around it. 5. Breathe in slowly and as deeply as possible, raising the piston or the ball toward the top of the column. 6. Hold your breath for 3-5 seconds or for as long as possible. Allow the piston or ball to fall to the bottom of the column. 7. Remove the mouthpiece from your mouth and breathe out normally. 8. Rest for a few seconds and repeat Steps 1 through 7 at least 10 times every 1-2 hours when you are awake. Take your time and take a few normal breaths between deep breaths. 9. The spirometer may include an indicator to show your best effort. Use the indicator as a goal to work  toward during each repetition. 10. After each set of 10 deep breaths, practice coughing to be sure your lungs are clear. If you have an incision (the cut made at the time of surgery), support your incision when coughing by placing a pillow or rolled up towels firmly against it. Once you are able to get out of bed, walk around indoors and cough well. You may stop using the incentive spirometer when instructed by your caregiver.  RISKS AND COMPLICATIONS  Take your time so you do not get dizzy or light-headed.  If you are in pain, you may need to take or ask for pain medication before doing incentive spirometry. It is harder to take a deep breath if you are having pain. AFTER USE  Rest and breathe slowly and easily.  It can be helpful to keep track of a log of your progress. Your caregiver can provide you with  a simple table to help with this. If you are using the spirometer at home, follow these instructions: [place] IF:   You are having difficultly using the spirometer.  You have trouble using the spirometer as often as instructed.  Your pain medication is not giving enough relief while using the spirometer.  You develop fever of 100.5 F (38.1 C) or higher. SEEK IMMEDIATE MEDICAL CARE IF:   You cough up bloody sputum that had not been present before.  You develop fever of 102 F (38.9 C) or greater.  You develop worsening pain at or near the incision site. MAKE SURE YOU:   Understand these instructions.  Will watch your condition.  Will get help right away if you are not doing well or get worse. Document Released: 09/11/2006 Document Revised: 07/24/2011 Document Reviewed: 11/12/2006 [facility] [address] Patient Information 2014 [place], [place].   ________________________________________________________________________

## 2017-02-19 NOTE — Progress Notes (Signed)
Hgba1c 02/02/17 epic Clearance Dr. Gwendlyn Deutscher 02/02/17 chart ekg 02/02/17 epic

## 2017-02-21 ENCOUNTER — Ambulatory Visit: Payer: Self-pay | Admitting: Orthopedic Surgery

## 2017-02-21 NOTE — H&P (Signed)
Kathy Duncan DOB: Dec 04, 1955 Married / Language: English / Race: Black or African American Female  H&P Date: 02/21/17  Chief Complaint: Right knee pain  History of Present Illness The patient is a 61 year old female who comes in today for a preoperative History and Physical. The patient is scheduled for a right total knee arthroplasty to be performed by Dr. Johnn Hai, MD at [facility] on 03/01/17. Kathy Duncan reports chronic pain in both knees, right worse than left, progressively worsening and refractory to conservative tx including injections, bracing, home exercise program, activity modifications, quad strengthening, relative rest. Pain is limiting her ADLs and quality of life and she desires to proceed with surgery.  Dr. Tonita Cong and the patient mutually agreed to proceed with a right total knee replacement. Risks and benefits of the procedure were discussed including stiffness, suboptimal range of motion, persistent pain, infection requiring removal of prosthesis and reinsertion, need for prophylactic antibiotics in the future, for example, dental procedures, possible need for manipulation, revision in the future and also anesthetic complications including DVT, PE, etc. We discussed the perioperative course, time in the hospital, postoperative recovery and the need for elevation to control swelling. We also discussed the predicted range of motion and the probability that squatting and kneeling would be unobtainable in the future. In addition, postoperative anticoagulation was discussed. We have obtained preoperative medical clearance as necessary. Provided illustrated handout and discussed it in detail. They will enroll in the total joint replacement educational forum at the hospital.  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 mother, sister and brother Drug / Alcohol Addiction  sister and brother First Degree Relatives  reported Heart disease in female family member before age 64  Hypertension  mother, father, sister and brother  Social History Tobacco use  Never smoker. never smoker Marital status  married Never smoker  Number of flights of stairs before winded  greater than 5 Exercise  Exercises daily; does running / walking Illicit drug use  no Living situation  live with spouse, one story home with 2 steps to enter Pain Contract  no Tobacco / smoke exposure  yes Drug/Alcohol Rehab (Previously)  no Children  2 Current work status  working full time Drug/Alcohol Rehab (Currently)  no Alcohol use  current drinker; drinks beer; 5-7 per week Post-Surgical Plans  home with HHPT, husband and daughter to help Advance Directives  none  Medication History Tylenol (1 (one) Oral as needed) Specific strength unknown - Active. Lantus (100UNIT/ML Solution, Subcutaneous) Active. Hyzaar (100-25MG  Tablet, Oral) Active. Lovastatin (40MG  Tablet, Oral) Active. MetFORMIN HCl (1000MG  Tablet, Oral) Active. Aspirin (81MG  Tablet, 1 (one) Oral) Active. Medications Reconciled  Pregnancy / Birth History Pregnant  no  Past Surgical History Hysterectomy  partial (non-cancerous) Tubal Ligation   Review of Systems General Not Present- Chills, Fatigue, Fever, Memory Loss, Night Sweats, Weight Gain and Weight Loss. Skin Present- Rash (left arm). Not Present- Eczema, Hives, Itching and Lesions. HEENT Present- Dentures. Not Present- Double Vision, Headache, Hearing Loss,  Tinnitus and Visual Loss. Respiratory Not Present- Allergies, Chronic Cough, Coughing up  blood, Shortness of breath at rest and Shortness of breath with exertion. Cardiovascular Not Present- Chest Pain, Difficulty Breathing Lying Down, Murmur, Palpitations, Racing/skipping heartbeats and Swelling. Gastrointestinal Not Present- Abdominal Pain, Bloody Stool, Constipation, Diarrhea, Difficulty Swallowing, Heartburn, Jaundice, Loss of appetitie, Nausea and Vomiting. Female Genitourinary Not Present- Blood in Urine, Discharge, Flank Pain, Incontinence, Painful Urination, Urgency, Urinary frequency, Urinary Retention, Urinating at Night and Weak urinary stream. Musculoskeletal Present- Joint Pain. Not Present- Back Pain, Joint Swelling, Morning Stiffness, Muscle Pain, Muscle Weakness and Spasms. Neurological Not Present- Blackout spells, Difficulty with balance, Dizziness, Paralysis, Tremor and Weakness. Psychiatric Not Present- Insomnia.  Physical Exam  General Mental Status -Alert, cooperative and good historian. General Appearance-pleasant, Not in acute distress. Orientation-Oriented X3. Build & Nutrition-Well nourished and Well developed.  Head and Neck Head-normocephalic, atraumatic . Neck Global Assessment - supple, no bruit auscultated on the right, no bruit auscultated on the left.  Eye Pupil - Bilateral-Regular and Round. Motion - Bilateral-EOMI.  Chest and Lung Exam Auscultation Breath sounds - clear at anterior chest wall and clear at posterior chest wall. Adventitious sounds - No Adventitious sounds.  Cardiovascular Auscultation Rhythm - Regular rate and rhythm. Heart Sounds - S1 WNL and S2 WNL. Murmurs & Other Heart Sounds - Auscultation of the heart reveals - No Murmurs.  Abdomen Palpation/Percussion Tenderness - Abdomen is non-tender to palpation. Rigidity (guarding) - Abdomen is soft. Auscultation Auscultation of the abdomen reveals - Bowel sounds  normal.  Female Genitourinary Not done, not pertinent to present illness  Musculoskeletal Moderate distress. Walks in antalgic gait. Exquisitely tender medial joint line. Patellofemoral pain compression. Crepitus noted. Mild effusion. No evidence of infection. No DVT. Quad strength is 5 or 5. Range of -3-120.  Assessment & Plan Primary osteoarthritis of right knee  Pt with end-stage right knee DJD, bone-on-bone, refractory to conservative tx, scheduled for right total knee replacement by Dr. Tonita Cong on 03/01/17. We again discussed the procedure itself as well as risks, complications and alternatives, including but not limited to DVT, PE, infx, bleeding, failure of procedure, need for secondary procedure including manipulation, nerve injury, ongoing pain/symptoms, anesthesia risk, even stroke or death. Also discussed typical post-op protocols, activity restrictions, need for PT, flexion/extension exercises, time out of work. Discussed need for DVT ppx post-op per protocol. Discussed dental ppx and infx prevention. Also discussed limitations post-operatively such as kneeling and squatting. All questions were answered. Patient desires to proceed with surgery as scheduled. Will hold supplements, ASA and NSAIDs accordingly. Will remain NPO after MN night before surgery. Will present to [facility] for pre-op testing. Anticipate hospital stay to include at least 2 midnights given medical history and to ensure proper pain control. Plan ASA 325mg  BID for DVT ppx post-op. Plan Percocet, Robaxin, Colace, Miralax. Plan home with HHPT post-op with family members at home for assistance. Will follow up 10-14 days post-op for suture removal and xrays.  Plan right total knee replacement  Signed electronically by Cecilie Kicks, PA-C for Dr. Tonita Cong

## 2017-02-23 ENCOUNTER — Encounter (HOSPITAL_COMMUNITY)
Admission: RE | Admit: 2017-02-23 | Discharge: 2017-02-23 | Disposition: A | Discharge status: Home / Self Care | Payer: Commercial Managed Care - PPO | Source: Ambulatory Visit | Attending: Specialist | Admitting: Specialist

## 2017-02-23 ENCOUNTER — Encounter (HOSPITAL_COMMUNITY): Payer: Self-pay

## 2017-02-23 DIAGNOSIS — M1711 Unilateral primary osteoarthritis, right knee: Secondary | ICD-10-CM | POA: Insufficient documentation

## 2017-02-23 DIAGNOSIS — Z01818 Encounter for other preprocedural examination: Secondary | ICD-10-CM | POA: Insufficient documentation

## 2017-02-23 LAB — CBC
HCT: 36 % (ref 36.0–46.0)
Hemoglobin: 11.3 g/dL — ABNORMAL LOW (ref 12.0–15.0)
MCH: 25.2 pg — ABNORMAL LOW (ref 26.0–34.0)
MCHC: 31.4 g/dL (ref 30.0–36.0)
MCV: 80.4 fL (ref 78.0–100.0)
Platelets: 309 10*3/uL (ref 150–400)
RBC: 4.48 MIL/uL (ref 3.87–5.11)
RDW: 15.3 % (ref 11.5–15.5)
WBC: 4.6 10*3/uL (ref 4.0–10.5)

## 2017-02-23 LAB — URINALYSIS, ROUTINE W REFLEX MICROSCOPIC
Bacteria, UA: NONE SEEN
Bilirubin Urine: NEGATIVE
Glucose, UA: NEGATIVE mg/dL
Hgb urine dipstick: NEGATIVE
Ketones, ur: NEGATIVE mg/dL
Nitrite: NEGATIVE
Protein, ur: NEGATIVE mg/dL
Specific Gravity, Urine: 1.012 (ref 1.005–1.030)
pH: 6 (ref 5.0–8.0)

## 2017-02-23 LAB — BASIC METABOLIC PANEL
Anion gap: 9 (ref 5–15)
BUN: 14 mg/dL (ref 6–20)
CO2: 28 mmol/L (ref 22–32)
Calcium: 9.3 mg/dL (ref 8.9–10.3)
Chloride: 102 mmol/L (ref 101–111)
Creatinine, Ser: 0.69 mg/dL (ref 0.44–1.00)
GFR calc Af Amer: >60 mL/min (ref >60)
GFR calc non Af Amer: >60 mL/min (ref >60)
Glucose, Bld: 137 mg/dL — ABNORMAL HIGH (ref 65–99)
Potassium: 3.8 mmol/L (ref 3.5–5.1)
Sodium: 139 mmol/L (ref 135–145)

## 2017-02-23 LAB — PROTIME-INR
INR: 0.98
Prothrombin Time: 12.9 seconds (ref 11.4–15.2)

## 2017-02-23 LAB — GLUCOSE, CAPILLARY: Glucose-Capillary: 198 mg/dL — ABNORMAL HIGH (ref 65–99)

## 2017-02-23 LAB — SURGICAL PCR SCREEN
MRSA, PCR: NEGATIVE
Staphylococcus aureus: NEGATIVE

## 2017-02-23 LAB — APTT: aPTT: 29 seconds (ref 24–36)

## 2017-03-01 ENCOUNTER — Inpatient Hospital Stay (HOSPITAL_COMMUNITY)
Admission: RE | Admit: 2017-03-01 | Discharge: 2017-03-03 | DRG: 470 | Disposition: A | Discharge status: Home Health | Payer: Commercial Managed Care - PPO | Source: Ambulatory Visit | Attending: Specialist | Admitting: Specialist

## 2017-03-01 ENCOUNTER — Inpatient Hospital Stay (HOSPITAL_COMMUNITY): Payer: Commercial Managed Care - PPO | Admitting: Anesthesiology

## 2017-03-01 ENCOUNTER — Encounter (HOSPITAL_COMMUNITY): Payer: Self-pay | Admitting: Emergency Medicine

## 2017-03-01 ENCOUNTER — Inpatient Hospital Stay (HOSPITAL_COMMUNITY): Payer: Commercial Managed Care - PPO

## 2017-03-01 ENCOUNTER — Encounter (HOSPITAL_COMMUNITY)
Admission: RE | Disposition: A | Discharge status: Home Health | Payer: Self-pay | Source: Ambulatory Visit | Attending: Specialist

## 2017-03-01 DIAGNOSIS — Z96651 Presence of right artificial knee joint: Secondary | ICD-10-CM | POA: Diagnosis not present

## 2017-03-01 DIAGNOSIS — Z90710 Acquired absence of both cervix and uterus: Secondary | ICD-10-CM | POA: Diagnosis not present

## 2017-03-01 DIAGNOSIS — D649 Anemia, unspecified: Secondary | ICD-10-CM | POA: Diagnosis not present

## 2017-03-01 DIAGNOSIS — Z7982 Long term (current) use of aspirin: Secondary | ICD-10-CM

## 2017-03-01 DIAGNOSIS — Z833 Family history of diabetes mellitus: Secondary | ICD-10-CM | POA: Diagnosis not present

## 2017-03-01 DIAGNOSIS — Z79899 Other long term (current) drug therapy: Secondary | ICD-10-CM | POA: Diagnosis not present

## 2017-03-01 DIAGNOSIS — E119 Type 2 diabetes mellitus without complications: Secondary | ICD-10-CM | POA: Diagnosis present

## 2017-03-01 DIAGNOSIS — G8918 Other acute postprocedural pain: Secondary | ICD-10-CM | POA: Diagnosis not present

## 2017-03-01 DIAGNOSIS — I10 Essential (primary) hypertension: Secondary | ICD-10-CM | POA: Diagnosis not present

## 2017-03-01 DIAGNOSIS — Z8249 Family history of ischemic heart disease and other diseases of the circulatory system: Secondary | ICD-10-CM | POA: Diagnosis not present

## 2017-03-01 DIAGNOSIS — Z794 Long term (current) use of insulin: Secondary | ICD-10-CM

## 2017-03-01 DIAGNOSIS — Z471 Aftercare following joint replacement surgery: Secondary | ICD-10-CM | POA: Diagnosis not present

## 2017-03-01 DIAGNOSIS — M1711 Unilateral primary osteoarthritis, right knee: Principal | ICD-10-CM

## 2017-03-01 DIAGNOSIS — M21161 Varus deformity, not elsewhere classified, right knee: Secondary | ICD-10-CM | POA: Diagnosis not present

## 2017-03-01 DIAGNOSIS — E785 Hyperlipidemia, unspecified: Secondary | ICD-10-CM | POA: Diagnosis not present

## 2017-03-01 DIAGNOSIS — Z96659 Presence of unspecified artificial knee joint: Secondary | ICD-10-CM

## 2017-03-01 HISTORY — PX: TOTAL KNEE ARTHROPLASTY: SHX125

## 2017-03-01 LAB — GLUCOSE, CAPILLARY
Glucose-Capillary: 106 mg/dL — ABNORMAL HIGH (ref 65–99)
Glucose-Capillary: 128 mg/dL — ABNORMAL HIGH (ref 65–99)
Glucose-Capillary: 60 mg/dL — ABNORMAL LOW (ref 65–99)
Glucose-Capillary: 152 mg/dL — ABNORMAL HIGH (ref 65–99)
Glucose-Capillary: 282 mg/dL — ABNORMAL HIGH (ref 65–99)

## 2017-03-01 SURGERY — ARTHROPLASTY, KNEE, TOTAL
Anesthesia: Spinal | Site: Knee | Laterality: Right

## 2017-03-01 MED ORDER — MIDAZOLAM HCL 5 MG/5ML IJ SOLN
INTRAMUSCULAR | Status: DC | PRN
  Administered 2017-03-01: 2 mg via INTRAVENOUS

## 2017-03-01 MED ORDER — MAGNESIUM CITRATE PO SOLN: 1.0000 | Freq: Once | ORAL | Status: DC | PRN

## 2017-03-01 MED ORDER — ONDANSETRON HCL 4 MG/2ML IJ SOLN: 4.0000 mg | Freq: Four times a day (QID) | INTRAMUSCULAR | Status: DC | PRN

## 2017-03-01 MED ORDER — FENTANYL CITRATE (PF) 100 MCG/2ML IJ SOLN
100.0000 ug | Freq: Once | INTRAMUSCULAR | Status: AC
  Administered 2017-03-01: 100 ug via INTRAVENOUS

## 2017-03-01 MED ORDER — FENTANYL CITRATE (PF) 100 MCG/2ML IJ SOLN
INTRAMUSCULAR | Status: DC | PRN
  Administered 2017-03-01 (×2): 50 ug via INTRAVENOUS

## 2017-03-01 MED ORDER — SODIUM CHLORIDE 0.9 % IR SOLN
Status: DC | PRN
  Administered 2017-03-01: 1000 mL

## 2017-03-01 MED ORDER — DOCUSATE SODIUM 100 MG PO CAPS: 100.0000 mg | ORAL_CAPSULE | Freq: Two times a day (BID) | ORAL | 1 refills | Status: DC | PRN

## 2017-03-01 MED ORDER — DOCUSATE SODIUM 100 MG PO CAPS
100.0000 mg | ORAL_CAPSULE | Freq: Two times a day (BID) | ORAL | Status: DC
  Administered 2017-03-02 – 2017-03-03 (×3): 100 mg via ORAL
  Filled 2017-03-01 (×3): qty 1

## 2017-03-01 MED ORDER — LABETALOL HCL 5 MG/ML IV SOLN
INTRAVENOUS | Status: DC | PRN
  Administered 2017-03-01 (×2): 5 mg via INTRAVENOUS

## 2017-03-01 MED ORDER — MIDAZOLAM HCL 2 MG/2ML IJ SOLN
2.0000 mg | Freq: Once | INTRAMUSCULAR | Status: AC
  Administered 2017-03-01: 1 mg via INTRAVENOUS

## 2017-03-01 MED ORDER — FENTANYL CITRATE (PF) 100 MCG/2ML IJ SOLN
INTRAMUSCULAR | Status: AC
  Filled 2017-03-01: qty 2

## 2017-03-01 MED ORDER — RISAQUAD PO CAPS
1.0000 | ORAL_CAPSULE | Freq: Every day | ORAL | Status: DC
Start: 2017-03-01 — End: 2017-03-03
  Administered 2017-03-01 – 2017-03-03 (×3): 1 via ORAL
  Filled 2017-03-01 (×3): qty 1

## 2017-03-01 MED ORDER — MIDAZOLAM HCL 2 MG/2ML IJ SOLN
INTRAMUSCULAR | Status: AC
  Administered 2017-03-01: 1 mg via INTRAVENOUS
  Filled 2017-03-01: qty 2

## 2017-03-01 MED ORDER — ROPIVACAINE HCL 7.5 MG/ML IJ SOLN
INTRAMUSCULAR | Status: DC | PRN
  Administered 2017-03-01: 20 mL via PERINEURAL

## 2017-03-01 MED ORDER — PROPOFOL 500 MG/50ML IV EMUL
INTRAVENOUS | Status: DC | PRN
  Administered 2017-03-01: 75 ug/kg/min via INTRAVENOUS

## 2017-03-01 MED ORDER — DEXTROSE 50 % IV SOLN
25.0000 mL | Freq: Once | INTRAVENOUS | Status: AC
  Administered 2017-03-01: 25 mL via INTRAVENOUS

## 2017-03-01 MED ORDER — ACETAMINOPHEN 10 MG/ML IV SOLN
1000.0000 mg | INTRAVENOUS | Status: AC
  Administered 2017-03-01: 1000 mg via INTRAVENOUS
  Filled 2017-03-01: qty 100

## 2017-03-01 MED ORDER — METOCLOPRAMIDE HCL 5 MG/ML IJ SOLN: 5.0000 mg | Freq: Three times a day (TID) | INTRAMUSCULAR | Status: DC | PRN

## 2017-03-01 MED ORDER — HYDROCHLOROTHIAZIDE 25 MG PO TABS
25.0000 mg | ORAL_TABLET | Freq: Every day | ORAL | Status: DC
  Administered 2017-03-01 – 2017-03-03 (×3): 25 mg via ORAL
  Filled 2017-03-01 (×3): qty 1

## 2017-03-01 MED ORDER — TRANEXAMIC ACID 1000 MG/10ML IV SOLN
1000.0000 mg | INTRAVENOUS | Status: AC
  Administered 2017-03-01: 1000 mg via INTRAVENOUS
  Filled 2017-03-01: qty 1100

## 2017-03-01 MED ORDER — ACETAMINOPHEN 10 MG/ML IV SOLN
1000.0000 mg | Freq: Four times a day (QID) | INTRAVENOUS | Status: AC
  Administered 2017-03-01 – 2017-03-02 (×4): 1000 mg via INTRAVENOUS
  Filled 2017-03-01 (×4): qty 100

## 2017-03-01 MED ORDER — DIPHENHYDRAMINE HCL 12.5 MG/5ML PO ELIX: 12.5000 mg | ORAL_SOLUTION | ORAL | Status: DC | PRN

## 2017-03-01 MED ORDER — MENTHOL 3 MG MT LOZG: 1.0000 | LOZENGE | OROMUCOSAL | Status: DC | PRN

## 2017-03-01 MED ORDER — GABAPENTIN 300 MG PO CAPS
300.0000 mg | ORAL_CAPSULE | Freq: Two times a day (BID) | ORAL | Status: DC
  Administered 2017-03-01 – 2017-03-03 (×4): 300 mg via ORAL
  Filled 2017-03-01 (×4): qty 1

## 2017-03-01 MED ORDER — ASPIRIN EC 325 MG PO TBEC: 325.0000 mg | DELAYED_RELEASE_TABLET | Freq: Two times a day (BID) | ORAL | 1 refills | Status: DC

## 2017-03-01 MED ORDER — PHENOL 1.4 % MT LIQD: 1.0000 | OROMUCOSAL | Status: DC | PRN

## 2017-03-01 MED ORDER — ACETAMINOPHEN 325 MG PO TABS: 650.0000 mg | ORAL_TABLET | Freq: Four times a day (QID) | ORAL | Status: DC | PRN

## 2017-03-01 MED ORDER — MIDAZOLAM HCL 2 MG/2ML IJ SOLN
INTRAMUSCULAR | Status: AC
  Filled 2017-03-01: qty 2

## 2017-03-01 MED ORDER — HYDROMORPHONE HCL-NACL 0.5-0.9 MG/ML-% IV SOSY: 0.2500 mg | PREFILLED_SYRINGE | INTRAVENOUS | Status: DC | PRN

## 2017-03-01 MED ORDER — GLYCOPYRROLATE 0.2 MG/ML IJ SOLN
INTRAMUSCULAR | Status: DC | PRN
  Administered 2017-03-01: 0.2 mg via INTRAVENOUS

## 2017-03-01 MED ORDER — METHOCARBAMOL 500 MG PO TABS
500.0000 mg | ORAL_TABLET | Freq: Four times a day (QID) | ORAL | Status: DC | PRN
  Administered 2017-03-01 – 2017-03-03 (×5): 500 mg via ORAL
  Filled 2017-03-01 (×5): qty 1

## 2017-03-01 MED ORDER — ONDANSETRON HCL 4 MG/2ML IJ SOLN
INTRAMUSCULAR | Status: AC
  Filled 2017-03-01: qty 2

## 2017-03-01 MED ORDER — BISACODYL 5 MG PO TBEC: 5.0000 mg | DELAYED_RELEASE_TABLET | Freq: Every day | ORAL | Status: DC | PRN

## 2017-03-01 MED ORDER — ONDANSETRON HCL 4 MG PO TABS: 4.0000 mg | ORAL_TABLET | Freq: Four times a day (QID) | ORAL | Status: DC | PRN

## 2017-03-01 MED ORDER — ASPIRIN EC 325 MG PO TBEC
325.0000 mg | DELAYED_RELEASE_TABLET | Freq: Two times a day (BID) | ORAL | Status: DC
  Administered 2017-03-01 – 2017-03-03 (×4): 325 mg via ORAL
  Filled 2017-03-01 (×4): qty 1

## 2017-03-01 MED ORDER — METOCLOPRAMIDE HCL 5 MG PO TABS: 5.0000 mg | ORAL_TABLET | Freq: Three times a day (TID) | ORAL | Status: DC | PRN

## 2017-03-01 MED ORDER — METHOCARBAMOL 500 MG PO TABS: 500.0000 mg | ORAL_TABLET | Freq: Four times a day (QID) | ORAL | 1 refills | Status: DC | PRN

## 2017-03-01 MED ORDER — SODIUM CHLORIDE 0.9 % IV SOLN
INTRAVENOUS | Status: AC
  Filled 2017-03-01: qty 500000

## 2017-03-01 MED ORDER — METHOCARBAMOL 1000 MG/10ML IJ SOLN
500.0000 mg | Freq: Four times a day (QID) | INTRAVENOUS | Status: DC | PRN
  Administered 2017-03-01: 500 mg via INTRAVENOUS
  Filled 2017-03-01: qty 550

## 2017-03-01 MED ORDER — LACTATED RINGERS IV SOLN
INTRAVENOUS | Status: DC
  Administered 2017-03-01 (×3): via INTRAVENOUS

## 2017-03-01 MED ORDER — OXYCODONE-ACETAMINOPHEN 5-325 MG PO TABS: 1.0000 | ORAL_TABLET | ORAL | 0 refills | Status: DC | PRN

## 2017-03-01 MED ORDER — GLYCOPYRROLATE 0.2 MG/ML IV SOSY
PREFILLED_SYRINGE | INTRAVENOUS | Status: AC
  Filled 2017-03-01: qty 3

## 2017-03-01 MED ORDER — CEFAZOLIN SODIUM-DEXTROSE 2-4 GM/100ML-% IV SOLN
2.0000 g | INTRAVENOUS | Status: AC
Start: 2017-03-01 — End: 2017-03-01
  Administered 2017-03-01: 2 g via INTRAVENOUS
  Filled 2017-03-01: qty 100

## 2017-03-01 MED ORDER — PROPOFOL 10 MG/ML IV BOLUS
INTRAVENOUS | Status: AC
  Filled 2017-03-01: qty 20

## 2017-03-01 MED ORDER — BUPIVACAINE-EPINEPHRINE 0.25% -1:200000 IJ SOLN
INTRAMUSCULAR | Status: AC
  Filled 2017-03-01: qty 1

## 2017-03-01 MED ORDER — PROPOFOL 10 MG/ML IV BOLUS
INTRAVENOUS | Status: AC
  Filled 2017-03-01: qty 60

## 2017-03-01 MED ORDER — LOSARTAN POTASSIUM-HCTZ 100-25 MG PO TABS: 1.0000 | ORAL_TABLET | Freq: Every day | ORAL | Status: DC

## 2017-03-01 MED ORDER — POLYETHYLENE GLYCOL 3350 17 G PO PACK: 17.0000 g | PACK | Freq: Every day | ORAL | 0 refills | Status: DC

## 2017-03-01 MED ORDER — INSULIN ASPART 100 UNIT/ML ~~LOC~~ SOLN
0.0000 [IU] | Freq: Three times a day (TID) | SUBCUTANEOUS | Status: DC
Start: 2017-03-01 — End: 2017-03-03
  Administered 2017-03-01: 3 [IU] via SUBCUTANEOUS
  Administered 2017-03-02 (×2): 5 [IU] via SUBCUTANEOUS
  Administered 2017-03-02: 3 [IU] via SUBCUTANEOUS
  Administered 2017-03-03: 8 [IU] via SUBCUTANEOUS
  Administered 2017-03-03: 5 [IU] via SUBCUTANEOUS

## 2017-03-01 MED ORDER — HYDROMORPHONE HCL 1 MG/ML IJ SOLN
1.0000 mg | INTRAMUSCULAR | Status: DC | PRN
  Administered 2017-03-02 (×2): 1 mg via INTRAVENOUS
  Filled 2017-03-01 (×2): qty 1

## 2017-03-01 MED ORDER — BUPIVACAINE-EPINEPHRINE 0.25% -1:200000 IJ SOLN
INTRAMUSCULAR | Status: DC | PRN
  Administered 2017-03-01: 34 mL

## 2017-03-01 MED ORDER — LOSARTAN POTASSIUM 50 MG PO TABS
100.0000 mg | ORAL_TABLET | Freq: Every day | ORAL | Status: DC
  Administered 2017-03-01 – 2017-03-03 (×3): 100 mg via ORAL
  Filled 2017-03-01 (×3): qty 2

## 2017-03-01 MED ORDER — SODIUM CHLORIDE 0.9 % IV SOLN
INTRAVENOUS | Status: DC | PRN
  Administered 2017-03-01: 500 mL

## 2017-03-01 MED ORDER — ALUM & MAG HYDROXIDE-SIMETH 200-200-20 MG/5ML PO SUSP: 30.0000 mL | ORAL | Status: DC | PRN

## 2017-03-01 MED ORDER — OXYCODONE HCL 5 MG PO TABS
5.0000 mg | ORAL_TABLET | ORAL | Status: DC | PRN
  Administered 2017-03-01: 10 mg via ORAL
  Administered 2017-03-01 (×2): 5 mg via ORAL
  Administered 2017-03-02 – 2017-03-03 (×8): 10 mg via ORAL
  Filled 2017-03-01 (×4): qty 2
  Filled 2017-03-01: qty 1
  Filled 2017-03-01 (×5): qty 2
  Filled 2017-03-01: qty 1

## 2017-03-01 MED ORDER — ACETAMINOPHEN 650 MG RE SUPP: 650.0000 mg | Freq: Four times a day (QID) | RECTAL | Status: DC | PRN

## 2017-03-01 MED ORDER — POTASSIUM CHLORIDE IN NACL 20-0.45 MEQ/L-% IV SOLN
INTRAVENOUS | Status: AC
  Administered 2017-03-01: 16:00:00 via INTRAVENOUS
  Filled 2017-03-01 (×2): qty 1000

## 2017-03-01 MED ORDER — POLYETHYLENE GLYCOL 3350 17 G PO PACK: 17.0000 g | PACK | Freq: Every day | ORAL | Status: DC | PRN

## 2017-03-01 MED ORDER — DEXTROSE 50 % IV SOLN
INTRAVENOUS | Status: AC
  Administered 2017-03-01: 25 mL via INTRAVENOUS
  Filled 2017-03-01: qty 50

## 2017-03-01 MED ORDER — CEFAZOLIN SODIUM-DEXTROSE 2-4 GM/100ML-% IV SOLN
2.0000 g | Freq: Four times a day (QID) | INTRAVENOUS | Status: AC
  Administered 2017-03-01 – 2017-03-02 (×3): 2 g via INTRAVENOUS
  Filled 2017-03-01 (×3): qty 100

## 2017-03-01 MED ORDER — STERILE WATER FOR IRRIGATION IR SOLN
Status: DC | PRN
  Administered 2017-03-01: 2000 mL

## 2017-03-01 MED ORDER — FENTANYL CITRATE (PF) 100 MCG/2ML IJ SOLN
INTRAMUSCULAR | Status: AC
  Administered 2017-03-01: 100 ug via INTRAVENOUS
  Filled 2017-03-01: qty 2

## 2017-03-01 MED ORDER — DEXAMETHASONE SODIUM PHOSPHATE 10 MG/ML IJ SOLN
INTRAMUSCULAR | Status: AC
  Filled 2017-03-01: qty 1

## 2017-03-01 SURGICAL SUPPLY — 62 items
AGENT HMST SPONGE THK3/8 (HEMOSTASIS)
BAG SPEC THK2 15X12 ZIP CLS (MISCELLANEOUS)
BAG ZIPLOCK 12X15 (MISCELLANEOUS) IMPLANT
BANDAGE ACE 4X5 VEL STRL LF (GAUZE/BANDAGES/DRESSINGS) ×2 IMPLANT
BANDAGE ACE 6X5 VEL STRL LF (GAUZE/BANDAGES/DRESSINGS) ×2 IMPLANT
BLADE SAG 18X100X1.27 (BLADE) ×2 IMPLANT
BLADE SAW SGTL 11.0X1.19X90.0M (BLADE) ×2 IMPLANT
BLADE SAW SGTL 13.0X1.19X90.0M (BLADE) ×2 IMPLANT
CAPT KNEE TOTAL 3 ATTUNE ×2 IMPLANT
CEMENT HV SMART SET (Cement) ×4 IMPLANT
CLOTH 2% CHLOROHEXIDINE 3PK (PERSONAL CARE ITEMS) ×2 IMPLANT
COVER SURGICAL LIGHT HANDLE (MISCELLANEOUS) ×2 IMPLANT
CUFF TOURN SGL QUICK 34 (TOURNIQUET CUFF) ×2
CUFF TRNQT CYL 34X4X40X1 (TOURNIQUET CUFF) ×1 IMPLANT
DECANTER SPIKE VIAL GLASS SM (MISCELLANEOUS) ×2 IMPLANT
DRAPE INCISE IOBAN 66X45 STRL (DRAPES) IMPLANT
DRAPE ORTHO SPLIT 77X108 STRL (DRAPES) ×2
DRAPE SHEET LG 3/4 BI-LAMINATE (DRAPES) ×2 IMPLANT
DRAPE SURG ORHT 6 SPLT 77X108 (DRAPES) ×2 IMPLANT
DRAPE U-SHAPE 47X51 STRL (DRAPES) ×2 IMPLANT
DRSG AQUACEL AG ADV 3.5X10 (GAUZE/BANDAGES/DRESSINGS) ×2 IMPLANT
DRSG TEGADERM 4X4.75 (GAUZE/BANDAGES/DRESSINGS) IMPLANT
DURAPREP 26ML APPLICATOR (WOUND CARE) ×2 IMPLANT
ELECT REM PT RETURN 15FT ADLT (MISCELLANEOUS) ×2 IMPLANT
EVACUATOR 1/8 PVC DRAIN (DRAIN) IMPLANT
GAUZE SPONGE 2X2 8PLY STRL LF (GAUZE/BANDAGES/DRESSINGS) IMPLANT
GLOVE BIOGEL PI IND STRL 7.0 (GLOVE) ×1 IMPLANT
GLOVE BIOGEL PI IND STRL 8 (GLOVE) ×1 IMPLANT
GLOVE BIOGEL PI INDICATOR 7.0 (GLOVE) ×1
GLOVE BIOGEL PI INDICATOR 8 (GLOVE) ×1
GLOVE SURG SS PI 7.0 STRL IVOR (GLOVE) ×2 IMPLANT
GLOVE SURG SS PI 7.5 STRL IVOR (GLOVE) IMPLANT
GLOVE SURG SS PI 8.0 STRL IVOR (GLOVE) ×4 IMPLANT
GOWN STRL REUS W/TWL XL LVL3 (GOWN DISPOSABLE) ×4 IMPLANT
HANDPIECE INTERPULSE COAX TIP (DISPOSABLE) ×2
HEMOSTAT SPONGE AVITENE ULTRA (HEMOSTASIS) IMPLANT
IMMOBILIZER KNEE 20 (SOFTGOODS) ×2
IMMOBILIZER KNEE 20 THIGH 36 (SOFTGOODS) ×1 IMPLANT
MANIFOLD NEPTUNE II (INSTRUMENTS) ×2 IMPLANT
NS IRRIG 1000ML POUR BTL (IV SOLUTION) IMPLANT
PACK TOTAL KNEE CUSTOM (KITS) ×2 IMPLANT
POSITIONER SURGICAL ARM (MISCELLANEOUS) ×2 IMPLANT
SET HNDPC FAN SPRY TIP SCT (DISPOSABLE) ×1 IMPLANT
SPONGE GAUZE 2X2 STER 10/PKG (GAUZE/BANDAGES/DRESSINGS)
SPONGE SURGIFOAM ABS GEL 100 (HEMOSTASIS) IMPLANT
STAPLER VISISTAT (STAPLE) IMPLANT
STRIP CLOSURE SKIN 1/2X4 (GAUZE/BANDAGES/DRESSINGS) ×4 IMPLANT
SUT BONE WAX W31G (SUTURE) IMPLANT
SUT MNCRL AB 4-0 PS2 18 (SUTURE) ×2 IMPLANT
SUT STRATAFIX 0 PDS 27 VIOLET (SUTURE) ×2
SUT VIC AB 1 CT1 27 (SUTURE) ×4
SUT VIC AB 1 CT1 27XBRD ANTBC (SUTURE) ×2 IMPLANT
SUT VIC AB 2-0 CT1 27 (SUTURE) ×8
SUT VIC AB 2-0 CT1 TAPERPNT 27 (SUTURE) ×4 IMPLANT
SUTURE STRATFX 0 PDS 27 VIOLET (SUTURE) ×1 IMPLANT
SYR 50ML LL SCALE MARK (SYRINGE) IMPLANT
TOWER CARTRIDGE SMART MIX (DISPOSABLE) ×2 IMPLANT
TRAY FOLEY CATH 14FRSI W/METER (CATHETERS) ×2 IMPLANT
TRAY FOLEY W/METER SILVER 16FR (SET/KITS/TRAYS/PACK) ×2 IMPLANT
WATER STERILE IRR 1500ML POUR (IV SOLUTION) ×4 IMPLANT
WRAP KNEE MAXI GEL POST OP (GAUZE/BANDAGES/DRESSINGS) ×2 IMPLANT
YANKAUER SUCT BULB TIP 10FT TU (MISCELLANEOUS) ×2 IMPLANT

## 2017-03-01 NOTE — Anesthesia Procedure Notes (Signed)
Date/Time: 03/01/2017 11:23 AM Performed by: Lissa Morales Pre-anesthesia Checklist: Patient identified, Emergency Drugs available, Suction available, Patient being monitored and Timeout performed Patient Re-evaluated:Patient Re-evaluated prior to induction Oxygen Delivery Method: Simple face mask Placement Confirmation: positive ETCO2

## 2017-03-01 NOTE — Anesthesia Postprocedure Evaluation (Signed)
Anesthesia Post Note  Patient: Kathy Duncan  Procedure(s) Performed: RIGHT TOTAL KNEE ARTHROPLASTY (Right Knee)     Patient location during evaluation: PACU Anesthesia Type: Spinal and Regional Level of consciousness: awake and alert Pain management: pain level controlled Vital Signs Assessment: post-procedure vital signs reviewed and stable Respiratory status: spontaneous breathing and respiratory function stable Cardiovascular status: blood pressure returned to baseline and stable Postop Assessment: spinal receding Anesthetic complications: no    Last Vitals:  Vitals:   03/01/17 1423 03/01/17 1430  BP:  (!) 144/80  Pulse: 68 70  Resp: 16 19  Temp:    SpO2: 100% 100%    Last Pain:  Vitals:   03/01/17 1337  TempSrc:   PainSc: 1                  Kathy Duncan,W. EDMOND

## 2017-03-01 NOTE — Progress Notes (Signed)
AssistedDr. Edmond Fitzgerald with right, ultrasound guided, adductor canal block. Side rails up, monitors on throughout procedure. See vital signs in flow sheet. Tolerated Procedure well.  

## 2017-03-01 NOTE — Anesthesia Procedure Notes (Signed)
Spinal  Patient location during procedure: OR End time: 03/01/2017 11:31 AM Staffing Resident/CRNA: Enrigue Catena E Performed: anesthesiologist  Preanesthetic Checklist Completed: patient identified, site marked, surgical consent, pre-op evaluation, timeout performed, IV checked, risks and benefits discussed and monitors and equipment checked Spinal Block Patient position: sitting Prep: ChloraPrep Patient monitoring: heart rate, continuous pulse ox and blood pressure Approach: left paramedian Location: L3-4 Injection technique: single-shot Needle Needle type: Pencan  Needle gauge: 24 G Needle length: 9 cm Additional Notes Expiration date of kit checked and confirmed. Patient tolerated procedure well, without complications.

## 2017-03-01 NOTE — Discharge Instructions (Signed)

## 2017-03-01 NOTE — Anesthesia Procedure Notes (Signed)
Anesthesia Regional Block: Adductor canal block   Pre-Anesthetic Checklist: ,, timeout performed, Correct Patient, Correct Site, Correct Laterality, Correct Procedure, Correct Position, site marked, Risks and benefits discussed, pre-op evaluation,  At surgeon's request and post-op pain management  Laterality: Right  Prep: Maximum Sterile Barrier Precautions used, chloraprep       Needles:  Injection technique: Single-shot  Needle Type: Echogenic Stimulator Needle     Needle Length: 9cm  Needle Gauge: 21     Additional Needles:   Procedures:,,,, ultrasound used (permanent image in chart),,,,  Narrative:  Start time: 03/01/2017 10:20 AM End time: 03/01/2017 10:30 AM Injection made incrementally with aspirations every 5 mL. Anesthesiologist: Roderic Palau  Additional Notes: 2% Lidocaine skin wheel.

## 2017-03-01 NOTE — Anesthesia Preprocedure Evaluation (Addendum)
Anesthesia Evaluation  Patient identified by MRN, date of birth, ID band Patient awake    Reviewed: Allergy & Precautions, H&P , NPO status , Patient's Chart, lab work & pertinent test results  Airway Mallampati: II  TM Distance: >3 FB Neck ROM: Full    Dental no notable dental hx. (+) Teeth Intact, Dental Advisory Given   Pulmonary neg pulmonary ROS,    Pulmonary exam normal breath sounds clear to auscultation       Cardiovascular hypertension, Pt. on medications  Rhythm:Regular Rate:Normal     Neuro/Psych negative neurological ROS  negative psych ROS   GI/Hepatic negative GI ROS, Neg liver ROS,   Endo/Other  diabetes, Insulin Dependent, Oral Hypoglycemic Agents  Renal/GU negative Renal ROS  negative genitourinary   Musculoskeletal  (+) Arthritis , Osteoarthritis,    Abdominal   Peds  Hematology negative hematology ROS (+) anemia ,   Anesthesia Other Findings   Reproductive/Obstetrics negative OB ROS                            Anesthesia Physical Anesthesia Plan  ASA: III  Anesthesia Plan: Spinal   Post-op Pain Management:  Regional for Post-op pain   Induction: Intravenous  PONV Risk Score and Plan: 3 and Ondansetron, Dexamethasone, Midazolam and Propofol infusion  Airway Management Planned: Simple Face Mask  Additional Equipment:   Intra-op Plan:   Post-operative Plan:   Informed Consent: I have reviewed the patients History and Physical, chart, labs and discussed the procedure including the risks, benefits and alternatives for the proposed anesthesia with the patient or authorized representative who has indicated his/her understanding and acceptance.   Dental advisory given  Plan Discussed with: CRNA  Anesthesia Plan Comments:         Anesthesia Quick Evaluation

## 2017-03-01 NOTE — H&P (View-Only) (Signed)
Kathy Duncan DOB: [birth year] Married / Language: English / Race: Black or African American Female  H&P Date: 02/21/17  Chief Complaint: Right knee pain  History of Present Illness The patient is a 61 year old female who comes in today for a preoperative History and Physical. The patient is scheduled for a right total knee arthroplasty to be performed by Dr. Johnn Hai, MD at [facility] on 03/01/17. Kathy Duncan reports chronic pain in both knees, right worse than left, progressively worsening and refractory to conservative tx including injections, bracing, home exercise program, activity modifications, quad strengthening, relative rest. Pain is limiting her ADLs and quality of life and she desires to proceed with surgery.  Dr. Tonita Cong and the patient mutually agreed to proceed with a right total knee replacement. Risks and benefits of the procedure were discussed including stiffness, suboptimal range of motion, persistent pain, infection requiring removal of prosthesis and reinsertion, need for prophylactic antibiotics in the future, for example, dental procedures, possible need for manipulation, revision in the future and also anesthetic complications including DVT, PE, etc. We discussed the perioperative course, time in the hospital, postoperative recovery and the need for elevation to control swelling. We also discussed the predicted range of motion and the probability that squatting and kneeling would be unobtainable in the future. In addition, postoperative anticoagulation was discussed. We have obtained preoperative medical clearance as necessary. Provided illustrated handout and discussed it in detail. They will enroll in the total joint replacement educational forum at the hospital.  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 mother, sister and brother Drug / Alcohol Addiction  sister and brother First Degree Relatives  reported Heart disease in female family member before age 40  Hypertension  mother, father, sister and brother  Social History Tobacco use  Never smoker. never smoker Marital status  married Never smoker  Number of flights of stairs before winded  greater than 5 Exercise  Exercises daily; does running / walking Illicit drug use  no Living situation  live with spouse, one story home with 2 steps to enter Pain Contract  no Tobacco / smoke exposure  yes Drug/Alcohol Rehab (Previously)  no Children  2 Current work status  working full time Drug/Alcohol Rehab (Currently)  no Alcohol use  current drinker; drinks beer; 5-7 per week Post-Surgical Plans  home with HHPT, husband and daughter to help Advance Directives  none  Medication History Tylenol (1 (one) Oral as needed) Specific strength unknown - Active. Lantus (100UNIT/ML Solution, Subcutaneous) Active. Hyzaar (100-25MG  Tablet, Oral) Active. Lovastatin (40MG  Tablet, Oral) Active. MetFORMIN HCl (1000MG  Tablet, Oral) Active. Aspirin (81MG  Tablet, 1 (one) Oral) Active. Medications Reconciled  Pregnancy / Birth History Pregnant  no  Past Surgical History Hysterectomy  partial (non-cancerous) Tubal Ligation   Review of Systems General Not Present- Chills, Fatigue, Fever, Memory Loss, Night Sweats, Weight Gain and Weight Loss. Skin Present- Rash (left arm). Not Present- Eczema, Hives, Itching and Lesions. HEENT Present- Dentures. Not Present- Double Vision, Headache, Hearing Loss,  Tinnitus and Visual Loss. Respiratory Not Present- Allergies, Chronic Cough, Coughing up  blood, Shortness of breath at rest and Shortness of breath with exertion. Cardiovascular Not Present- Chest Pain, Difficulty Breathing Lying Down, Murmur, Palpitations, Racing/skipping heartbeats and Swelling. Gastrointestinal Not Present- Abdominal Pain, Bloody Stool, Constipation, Diarrhea, Difficulty Swallowing, Heartburn, Jaundice, Loss of appetitie, Nausea and Vomiting. Female Genitourinary Not Present- Blood in Urine, Discharge, Flank Pain, Incontinence, Painful Urination, Urgency, Urinary frequency, Urinary Retention, Urinating at Night and Weak urinary stream. Musculoskeletal Present- Joint Pain. Not Present- Back Pain, Joint Swelling, Morning Stiffness, Muscle Pain, Muscle Weakness and Spasms. Neurological Not Present- Blackout spells, Difficulty with balance, Dizziness, Paralysis, Tremor and Weakness. Psychiatric Not Present- Insomnia.  Physical Exam  General Mental Status -Alert, cooperative and good historian. General Appearance-pleasant, Not in acute distress. Orientation-Oriented X3. Build & Nutrition-Well nourished and Well developed.  Head and Neck Head-normocephalic, atraumatic . Neck Global Assessment - supple, no bruit auscultated on the right, no bruit auscultated on the left.  Eye Pupil - Bilateral-Regular and Round. Motion - Bilateral-EOMI.  Chest and Lung Exam Auscultation Breath sounds - clear at anterior chest wall and clear at posterior chest wall. Adventitious sounds - No Adventitious sounds.  Cardiovascular Auscultation Rhythm - Regular rate and rhythm. Heart Sounds - S1 WNL and S2 WNL. Murmurs & Other Heart Sounds - Auscultation of the heart reveals - No Murmurs.  Abdomen Palpation/Percussion Tenderness - Abdomen is non-tender to palpation. Rigidity (guarding) - Abdomen is soft. Auscultation Auscultation of the abdomen reveals - Bowel sounds  normal.  Female Genitourinary Not done, not pertinent to present illness  Musculoskeletal Moderate distress. Walks in antalgic gait. Exquisitely tender medial joint line. Patellofemoral pain compression. Crepitus noted. Mild effusion. No evidence of infection. No DVT. Quad strength is 5 or 5. Range of -3-120.  Assessment & Plan Primary osteoarthritis of right knee  Pt with end-stage right knee DJD, bone-on-bone, refractory to conservative tx, scheduled for right total knee replacement by Dr. Tonita Cong on 03/01/17. We again discussed the procedure itself as well as risks, complications and alternatives, including but not limited to DVT, PE, infx, bleeding, failure of procedure, need for secondary procedure including manipulation, nerve injury, ongoing pain/symptoms, anesthesia risk, even stroke or death. Also discussed typical post-op protocols, activity restrictions, need for PT, flexion/extension exercises, time out of work. Discussed need for DVT ppx post-op per protocol. Discussed dental ppx and infx prevention. Also discussed limitations post-operatively such as kneeling and squatting. All questions were answered. Patient desires to proceed with surgery as scheduled. Will hold supplements, ASA and NSAIDs accordingly. Will remain NPO after MN night before surgery. Will present to [facility] for pre-op testing. Anticipate hospital stay to include at least 2 midnights given medical history and to ensure proper pain control. Plan ASA 325mg  BID for DVT ppx post-op. Plan Percocet, Robaxin, Colace, Miralax. Plan home with HHPT post-op with family members at home for assistance. Will follow up 10-14 days post-op for suture removal and xrays.  Plan right total knee replacement  Signed electronically by Cecilie Kicks, PA-C for Dr. Tonita Cong

## 2017-03-01 NOTE — Interval H&P Note (Signed)
History and Physical Interval Note:  03/01/2017 10:45 AM  Kathy Duncan  has presented today for surgery, with the diagnosis of Right knee degenerative joint disease  The various methods of treatment have been discussed with the patient and family. After consideration of risks, benefits and other options for treatment, the patient has consented to  Procedure(s) with comments: RIGHT TOTAL KNEE ARTHROPLASTY (Right) - 120 mins as a surgical intervention .  The patient's history has been reviewed, patient examined, no change in status, stable for surgery.  I have reviewed the patient's chart and labs.  Questions were answered to the patient's satisfaction.     Deontaye Civello C

## 2017-03-01 NOTE — Evaluation (Signed)
Physical Therapy Evaluation Patient Details Name: Kathy Duncan MRN: [id number] DOB: 09-27-1955 Today's Date: 03/01/2017   History of Present Illness  Pt s/p R TKR and with hx of DM  Clinical Impression  Pt s/p R TKR and presents with decreased R LE strength/ROM and post op pain limiting functional mobility.  Pt should progress to dc home with family assist.    Follow Up Recommendations Home health PT    Equipment Recommendations  Rolling walker with 5" wheels    Recommendations for Other Services OT consult     Precautions / Restrictions Precautions Precautions: Fall;Knee Required Braces or Orthoses: Knee Immobilizer - Right Knee Immobilizer - Right: Discontinue once straight leg raise with < 10 degree lag Restrictions Weight Bearing Restrictions: No Other Position/Activity Restrictions: WBAT      Mobility  Bed Mobility Overal bed mobility: Needs Assistance Bed Mobility: Supine to Sit     Supine to sit: Min assist     General bed mobility comments: cues for sequence and use of L LE to self assist  Transfers Overall transfer level: Needs assistance Equipment used: Rolling walker (2 wheeled) Transfers: Sit to/from Stand Sit to Stand: Min assist         General transfer comment: cues for LE management and use of UEs to self assist  Ambulation/Gait Ambulation/Gait assistance: Min assist Ambulation Distance (Feet): 39 Feet Assistive device: Rolling walker (2 wheeled) Gait Pattern/deviations: Step-to pattern;Decreased step length - right;Decreased step length - left;Shuffle;Trunk flexed Gait velocity: decr Gait velocity interpretation: Below normal speed for age/gender General Gait Details: cues for sequence, posture and position from [employer]            Wheelchair Mobility    Modified Rankin (Stroke Patients Only)       Balance                                             Pertinent Vitals/Pain Pain Assessment:  0-10 Pain Score: 5  Pain Location: R knee Pain Descriptors / Indicators: Aching;Sore Pain Intervention(s): Limited activity within patient's tolerance;Monitored during session;Premedicated before session;Ice applied    Home Living Family/patient expects to be discharged to:: Private residence Living Arrangements: Spouse/significant other Available Help at Discharge: Family Type of Home: House Home Access: Stairs to enter Entrance Stairs-Rails: None Secretary/administrator of Steps: 2 Home Layout: One level Home Equipment: None      Prior Function Level of Independence: Independent               Hand Dominance        Extremity/Trunk Assessment   Upper Extremity Assessment Upper Extremity Assessment: Overall WFL for tasks assessed    Lower Extremity Assessment Lower Extremity Assessment: RLE deficits/detail       Communication   Communication: No difficulties  Cognition Arousal/Alertness: Awake/alert Behavior During Therapy: WFL for tasks assessed/performed Overall Cognitive Status: Within Functional Limits for tasks assessed                                        General Comments      Exercises Total Joint Exercises Ankle Circles/Pumps: AROM;Both;15 reps;Supine   Assessment/Plan    PT Assessment Patient needs continued PT services  PT Problem List Decreased strength;Decreased range of motion;Decreased activity tolerance;Decreased mobility;Pain;Decreased knowledge of  use of DME       PT Treatment Interventions DME instruction;Gait training;Stair training;Functional mobility training;Therapeutic activities;Therapeutic exercise;Patient/family education    PT Goals (Current goals can be found in the Care Plan section)  Acute Rehab PT Goals Patient Stated Goal: Regain IND PT Goal Formulation: With patient Time For Goal Achievement: 03/05/17 Potential to Achieve Goals: Good    Frequency 7X/week   Barriers to discharge         Co-evaluation               AM-PAC PT "6 Clicks" Daily Activity  Outcome Measure Difficulty turning over in bed (including adjusting bedclothes, sheets and blankets)?: Unable Difficulty moving from lying on back to sitting on the side of the bed? : Unable Difficulty sitting down on and standing up from a chair with arms (e.g., wheelchair, bedside commode, etc,.)?: Unable Help needed moving to and from a bed to chair (including a wheelchair)?: A Little Help needed walking in hospital room?: A Little Help needed climbing 3-5 steps with a railing? : A Lot 6 Click Score: 11    End of Session Equipment Utilized During Treatment: Gait belt;Right knee immobilizer Activity Tolerance: Patient tolerated treatment well Patient left: in chair;with call bell/phone within reach;with chair alarm set Nurse Communication: Mobility status PT Visit Diagnosis: Difficulty in walking, not elsewhere classified (R26.2)    Time: 1710-1733 PT Time Calculation (min) (ACUTE ONLY): 23 min   Charges:   PT Evaluation $PT Eval Low Complexity: 1 Low PT Treatments $Gait Training: 8-22 mins   PT G Codes:        Pg [phone]   Kathy Duncan 03/01/2017, 6:07 PM

## 2017-03-01 NOTE — Transfer of Care (Signed)
Immediate Anesthesia Transfer of Care Note  Patient: Kathy Duncan  Procedure(s) Performed: RIGHT TOTAL KNEE ARTHROPLASTY (Right Knee)  Patient Location: PACU  Anesthesia Type:Spinal  Level of Consciousness: awake, alert , oriented and patient cooperative  Airway & Oxygen Therapy: Patient Spontanous Breathing and Patient connected to face mask oxygen  Post-op Assessment: Report given to RN and Post -op Vital signs reviewed and stable  Post vital signs: stable  Last Vitals:  Vitals:   03/01/17 1110 03/01/17 1337  BP: 126/76   Pulse: 73 89  Resp: 19   Temp:    SpO2: 100% 100%    Last Pain:  Vitals:   03/01/17 0826  TempSrc:   PainSc: 1       Patients Stated Pain Goal: 4 (16/10/96 0454)  Complications: No apparent anesthesia complications

## 2017-03-02 LAB — BASIC METABOLIC PANEL
Anion gap: 9 (ref 5–15)
BUN: 14 mg/dL (ref 6–20)
CO2: 27 mmol/L (ref 22–32)
Calcium: 8.4 mg/dL — ABNORMAL LOW (ref 8.9–10.3)
Chloride: 95 mmol/L — ABNORMAL LOW (ref 101–111)
Creatinine, Ser: 0.77 mg/dL (ref 0.44–1.00)
GFR calc Af Amer: >60 mL/min (ref >60)
GFR calc non Af Amer: >60 mL/min (ref >60)
Glucose, Bld: 256 mg/dL — ABNORMAL HIGH (ref 65–99)
Potassium: 4 mmol/L (ref 3.5–5.1)
Sodium: 131 mmol/L — ABNORMAL LOW (ref 135–145)

## 2017-03-02 LAB — GLUCOSE, CAPILLARY
Glucose-Capillary: 247 mg/dL — ABNORMAL HIGH (ref 65–99)
Glucose-Capillary: 235 mg/dL — ABNORMAL HIGH (ref 65–99)
Glucose-Capillary: 192 mg/dL — ABNORMAL HIGH (ref 65–99)
Glucose-Capillary: 225 mg/dL — ABNORMAL HIGH (ref 65–99)

## 2017-03-02 LAB — CBC
HCT: 29.9 % — ABNORMAL LOW (ref 36.0–46.0)
Hemoglobin: 9.8 g/dL — ABNORMAL LOW (ref 12.0–15.0)
MCH: 25.9 pg — ABNORMAL LOW (ref 26.0–34.0)
MCHC: 32.8 g/dL (ref 30.0–36.0)
MCV: 78.9 fL (ref 78.0–100.0)
Platelets: 253 10*3/uL (ref 150–400)
RBC: 3.79 MIL/uL — ABNORMAL LOW (ref 3.87–5.11)
RDW: 15 % (ref 11.5–15.5)
WBC: 7.7 10*3/uL (ref 4.0–10.5)

## 2017-03-02 NOTE — Progress Notes (Signed)
Physical Therapy Treatment Patient Details Name: Kathy Duncan MRN: [id number] DOB: 1956-01-07 Today's Date: 03/02/2017    History of Present Illness Pt s/p R TKR and with hx of DM    PT Comments    Pt continues motivated and progressing well with mobility.   Follow Up Recommendations  Home health PT     Equipment Recommendations  Rolling walker with 5" wheels    Recommendations for Other Services OT consult     Precautions / Restrictions Precautions Precautions: Fall;Knee Required Braces or Orthoses: Knee Immobilizer - Right Knee Immobilizer - Right: Discontinue once straight leg raise with < 10 degree lag (Pt performed IND SLR this am) Restrictions Weight Bearing Restrictions: No Other Position/Activity Restrictions: WBAT    Mobility  Bed Mobility               General bed mobility comments: Pt OOB with nursing and requests back to chair  Transfers Overall transfer level: Needs assistance Equipment used: Rolling walker (2 wheeled) Transfers: Sit to/from Stand Sit to Stand: Min assist         General transfer comment: cues for LE management and use of UEs to self assist  Ambulation/Gait Ambulation/Gait assistance: Min assist;Min guard Ambulation Distance (Feet): 70 Feet Assistive device: Rolling walker (2 wheeled) Gait Pattern/deviations: Step-to pattern;Decreased step length - right;Decreased step length - left;Shuffle;Trunk flexed Gait velocity: decr Gait velocity interpretation: Below normal speed for age/gender General Gait Details: cues for sequence, posture and position from [employer]            Wheelchair Mobility    Modified Rankin (Stroke Patients Only)       Balance                                            Cognition Arousal/Alertness: Awake/alert Behavior During Therapy: WFL for tasks assessed/performed Overall Cognitive Status: Within Functional Limits for tasks assessed                                         Exercises Total Joint Exercises Ankle Circles/Pumps: AROM;Both;15 reps;Supine Quad Sets: AROM;Both;10 reps;Supine Heel Slides: AAROM;15 reps;Supine;Right Straight Leg Raises: AAROM;AROM;Right;15 reps;Supine Goniometric ROM: AAROM R knee -8 - 45    General Comments        Pertinent Vitals/Pain Pain Assessment: 0-10 Pain Score: 7  Pain Location: R knee Pain Descriptors / Indicators: Aching;Sore Pain Intervention(s): Limited activity within patient's tolerance;Monitored during session;Premedicated before session;Ice applied    Home Living                      Prior Function            PT Goals (current goals can now be found in the care plan section) Acute Rehab PT Goals Patient Stated Goal: Regain IND PT Goal Formulation: With patient Time For Goal Achievement: 03/05/17 Potential to Achieve Goals: Good Progress towards PT goals: Progressing toward goals    Frequency    7X/week      PT Plan Current plan remains appropriate    Co-evaluation              AM-PAC PT "6 Clicks" Daily Activity  Outcome Measure  Difficulty turning over in bed (including adjusting bedclothes, sheets and blankets)?: Unable  Difficulty moving from lying on back to sitting on the side of the bed? : Unable Difficulty sitting down on and standing up from a chair with arms (e.g., wheelchair, bedside commode, etc,.)?: Unable Help needed moving to and from a bed to chair (including a wheelchair)?: A Little Help needed walking in hospital room?: A Little Help needed climbing 3-5 steps with a railing? : A Little 6 Click Score: 12    End of Session Equipment Utilized During Treatment: Gait belt Activity Tolerance: Patient tolerated treatment well Patient left: in chair;with call bell/phone within reach;with chair alarm set Nurse Communication: Mobility status PT Visit Diagnosis: Difficulty in walking, not elsewhere classified (R26.2)      Time: 2956-2130 PT Time Calculation (min) (ACUTE ONLY): 28 min  Charges:  $Gait Training: 8-22 mins $Therapeutic Exercise: 8-22 mins                    G Codes:       Pg [phone]    [place] Dalporto 03/02/2017, 12:26 PM

## 2017-03-02 NOTE — Progress Notes (Signed)
Physical Therapy Treatment Patient Details Name: Kathy Duncan MRN: [id number] DOB: 11-10-1955 Today's Date: 03/02/2017    History of Present Illness Pt s/p R TKR and with hx of DM    PT Comments    Steady progress with mobility.  Will address stairs in am.  Pt hopeful for dc tomorrow.   Follow Up Recommendations  Home health PT     Equipment Recommendations  Rolling walker with 5" wheels    Recommendations for Other Services OT consult     Precautions / Restrictions Precautions Precautions: Fall;Knee Required Braces or Orthoses: Knee Immobilizer - Right Knee Immobilizer - Right: Discontinue once straight leg raise with < 10 degree lag Restrictions Weight Bearing Restrictions: No Other Position/Activity Restrictions: WBAT    Mobility  Bed Mobility Overal bed mobility: Needs Assistance Bed Mobility: Sit to Supine       Sit to supine: Min assist   General bed mobility comments: CUes for sequence with min assist for R LE  Transfers Overall transfer level: Needs assistance Equipment used: Rolling walker (2 wheeled) Transfers: Sit to/from Stand Sit to Stand: Min guard         General transfer comment: cues for UE/LE placement  Ambulation/Gait Ambulation/Gait assistance: Min assist;Min guard Ambulation Distance (Feet): 63 Feet Assistive device: Rolling walker (2 wheeled) Gait Pattern/deviations: Step-to pattern;Decreased step length - right;Decreased step length - left;Shuffle;Trunk flexed Gait velocity: decr Gait velocity interpretation: Below normal speed for age/gender General Gait Details: cues for sequence, posture and position from [employer]            Wheelchair Mobility    Modified Rankin (Stroke Patients Only)       Balance                                            Cognition Arousal/Alertness: Awake/alert Behavior During Therapy: WFL for tasks assessed/performed Overall Cognitive Status: Within  Functional Limits for tasks assessed                                        Exercises Total Joint Exercises Ankle Circles/Pumps: AROM;Both;15 reps;Supine Quad Sets: AROM;Both;10 reps;Supine Heel Slides: AAROM;15 reps;Supine;Right Straight Leg Raises: AAROM;AROM;Right;15 reps;Supine Goniometric ROM: AAROM R knee -8 - 45    General Comments        Pertinent Vitals/Pain Pain Assessment: 0-10 Pain Score: 6  Pain Location: R knee Pain Descriptors / Indicators: Aching;Sore Pain Intervention(s): Limited activity within patient's tolerance;Monitored during session;Premedicated before session;Ice applied    Home Living Family/patient expects to be discharged to:: Private residence Living Arrangements: Spouse/significant other Available Help at Discharge: Family         Home Equipment: Tub bench      Prior Function Level of Independence: Independent          PT Goals (current goals can now be found in the care plan section) Acute Rehab PT Goals Patient Stated Goal: Regain IND PT Goal Formulation: With patient Time For Goal Achievement: 03/05/17 Potential to Achieve Goals: Good Progress towards PT goals: Progressing toward goals    Frequency    7X/week      PT Plan Current plan remains appropriate    Co-evaluation              AM-PAC PT "6  Clicks" Daily Activity  Outcome Measure  Difficulty turning over in bed (including adjusting bedclothes, sheets and blankets)?: Unable Difficulty moving from lying on back to sitting on the side of the bed? : Unable Difficulty sitting down on and standing up from a chair with arms (e.g., wheelchair, bedside commode, etc,.)?: Unable Help needed moving to and from a bed to chair (including a wheelchair)?: A Little Help needed walking in hospital room?: A Little Help needed climbing 3-5 steps with a railing? : A Little 6 Click Score: 12    End of Session Equipment Utilized During Treatment: Gait  belt Activity Tolerance: Patient tolerated treatment well Patient left: in bed;with call bell/phone within reach Nurse Communication: Mobility status PT Visit Diagnosis: Difficulty in walking, not elsewhere classified (R26.2)     Time: 1440-1457 PT Time Calculation (min) (ACUTE ONLY): 17 min  Charges:  $Gait Training: 8-22 mins $Therapeutic Exercise: 8-22 mins                    G Codes:       Pg [phone]    Alicha Raspberry 03/02/2017, 3:29 PM

## 2017-03-02 NOTE — Progress Notes (Signed)
Patient ID: Kathy Duncan, female   DOB: [birth year], 61 y.o.   MRN: [id number] Subjective: 1 Day Post-Op Procedure(s) (LRB): RIGHT TOTAL KNEE ARTHROPLASTY (Right) Patient reports pain as mild.    Patient has complaints of R knee pain. No other c/o this AM. No N/V, fever, chills, SOB, CP, numbness, tingling.  We will start therapy today. Plan is to go home with HHPT after hospital stay.  Objective: Vital signs in last 24 hours: Temp:  [97 F (36.1 C)-98.4 F (36.9 C)] 97.8 F (36.6 C) (10/19 0532) Pulse Rate:  [57-94] 71 (10/19 0532) Resp:  [10-22] 17 (10/19 0532) BP: (124-179)/(58-117) 136/70 (10/19 0532) SpO2:  [95 %-100 %] 99 % (10/19 0532)  Intake/Output from previous day:  Intake/Output Summary (Last 24 hours) at 03/02/17 0901 Last data filed at 03/02/17 0849  Gross per 24 hour  Intake          5114.17 ml  Output             3075 ml  Net          2039.17 ml    Intake/Output this shift: Total I/O In: 360 [P.O.:360] Out: -   Labs: Results for orders placed or performed during the hospital encounter of 03/01/17  Glucose, capillary  Result Value Ref Range   Glucose-Capillary 106 (H) 65 - 99 mg/dL   Comment 1 Notify RN   Glucose, capillary  Result Value Ref Range   Glucose-Capillary 60 (L) 65 - 99 mg/dL  Glucose, capillary  Result Value Ref Range   Glucose-Capillary 128 (H) 65 - 99 mg/dL  Glucose, capillary  Result Value Ref Range   Glucose-Capillary 152 (H) 65 - 99 mg/dL  CBC  Result Value Ref Range   WBC 7.7 4.0 - 10.5 K/uL   RBC 3.79 (L) 3.87 - 5.11 MIL/uL   Hemoglobin 9.8 (L) 12.0 - 15.0 g/dL   HCT 29.9 (L) 36.0 - 46.0 %   MCV 78.9 78.0 - 100.0 fL   MCH 25.9 (L) 26.0 - 34.0 pg   MCHC 32.8 30.0 - 36.0 g/dL   RDW 15.0 11.5 - 15.5 %   Platelets 253 150 - 400 K/uL  Basic metabolic panel  Result Value Ref Range   Sodium 131 (L) 135 - 145 mmol/L   Potassium 4.0 3.5 - 5.1 mmol/L   Chloride 95 (L) 101 - 111 mmol/L   CO2 27 22 - 32 mmol/L   Glucose, Bld  256 (H) 65 - 99 mg/dL   BUN 14 6 - 20 mg/dL   Creatinine, Ser 0.77 0.44 - 1.00 mg/dL   Calcium 8.4 (L) 8.9 - 10.3 mg/dL   GFR calc non Af Amer >60 >60 mL/min   GFR calc Af Amer >60 >60 mL/min   Anion gap 9 5 - 15  Glucose, capillary  Result Value Ref Range   Glucose-Capillary 282 (H) 65 - 99 mg/dL  Glucose, capillary  Result Value Ref Range   Glucose-Capillary 247 (H) 65 - 99 mg/dL    Exam - Neurologically intact ABD soft Neurovascular intact Sensation intact distally Intact pulses distally Dorsiflexion/Plantar flexion intact Incision: dressing C/D/I and no drainage No cellulitis present Compartment soft no calf pain or sign of DVT Dressing - clean, dry, no drainage Motor function intact - moving foot and toes well on exam.   Assessment/Plan: 1 Day Post-Op Procedure(s) (LRB): RIGHT TOTAL KNEE ARTHROPLASTY (Right)  Advance diet Up with therapy D/C IV fluids Past Medical History:  Diagnosis Date  . Arthritis   .  Diabetes mellitus    type 2  . Ganglion cyst of wrist 10/14/2013  . Hypertension   . KNEE MENISCUS INJURY, UNSPECIFIED 07/12/2006   Qualifier: History of  By: Carmie End MD, Erin      DVT Prophylaxis - ASA 325mg  BID Protocol Weight-Bearing as tolerated to Right leg No vaccines Plan home with HHPT when ready, tomorrow vs Sunday depending on pain control and progress with PT. Will discuss with Dr. Mliss Fritz, [place]. 03/02/2017, 9:01 AM

## 2017-03-02 NOTE — Evaluation (Signed)
Occupational Therapy Evaluation Patient Details Name: Kathy Duncan MRN: [id number] DOB: Mar 12, 1956 Today's Date: 03/02/2017    History of Present Illness Pt s/p R TKR and with hx of DM   Clinical Impression   This 61 year old female was admitted for the above.  She will benefit from one more session of OT to further educate on tub bench transfer. Pt will have 24/7 assistance at home and she feels comfortable with toilet transfer using 3:1 commode.    Follow Up Recommendations  No OT follow up;Supervision/Assistance - 24 hour    Equipment Recommendations  3 in 1 bedside commode    Recommendations for Other Services       Precautions / Restrictions Precautions Precautions: Fall;Knee Required Braces or Orthoses: Knee Immobilizer - Right Knee Immobilizer - Right: Discontinue once straight leg raise with < 10 degree lag (Pt performed IND SLR this am) Restrictions Weight Bearing Restrictions: No Other Position/Activity Restrictions: WBAT      Mobility Bed Mobility               General bed mobility comments: Pt OOB with nursing and requests back to chair  Transfers Overall transfer level: Needs assistance Equipment used: Rolling walker (2 wheeled) Transfers: Sit to/from Stand Sit to Stand: Min guard         General transfer comment: cues for UE/LE placement    Balance                                           ADL either performed or assessed with clinical judgement   ADL Overall ADL's : Needs assistance/impaired Eating/Feeding: Independent   Grooming: Supervision/safety;Standing   Upper Body Bathing: Set up;Sitting   Lower Body Bathing: Minimal assistance;Sit to/from stand   Upper Body Dressing : Set up;Sitting   Lower Body Dressing: Minimal assistance;Sit to/from stand   Toilet Transfer: Min guard;Ambulation;BSC;RW   Toileting- Architect and Hygiene: Min guard;Sit to/from stand         General ADL  Comments: ambulated to bathroom and used toilet.  She has a new tub bench which she has not used.  Simulated use, but she would like [place] to bring it by tomorrow, so that she can try it     Vision         Perception     Praxis      Pertinent Vitals/Pain Pain Assessment: 0-10 Pain Score: 7  Pain Location: R knee Pain Descriptors / Indicators: Aching;Sore Pain Intervention(s): Limited activity within patient's tolerance;Monitored during session;Premedicated before session;Ice applied     Hand Dominance     Extremity/Trunk Assessment Upper Extremity Assessment Upper Extremity Assessment: Overall WFL for tasks assessed           Communication Communication Communication: No difficulties   Cognition Arousal/Alertness: Awake/alert Behavior During Therapy: WFL for tasks assessed/performed Overall Cognitive Status: Within Functional Limits for tasks assessed                                     General Comments       Exercises    Shoulder Instructions      Home Living Family/patient expects to be discharged to:: Private residence Living Arrangements: Spouse/significant other Available Help at Discharge: Family  Bathroom Shower/Tub: Chief Strategy Officer: Standard     Home Equipment: Tub bench          Prior Functioning/Environment Level of Independence: Independent                 OT Problem List: Pain;Decreased activity tolerance;Decreased knowledge of use of DME or AE      OT Treatment/Interventions: Self-care/ADL training;DME and/or AE instruction;Patient/family education    OT Goals(Current goals can be found in the care plan section) Acute Rehab OT Goals Patient Stated Goal: Regain IND OT Goal Formulation: With patient Time For Goal Achievement: 03/09/17 Potential to Achieve Goals: Good ADL Goals Pt Will Perform Tub/Shower Transfer: Tub transfer;ambulating;tub bench;with supervision  OT  Frequency: Min 2X/week   Barriers to D/C:            Co-evaluation              AM-PAC PT "6 Clicks" Daily Activity     Outcome Measure Help from another person eating meals?: None Help from another person taking care of personal grooming?: A Little Help from another person toileting, which includes using toliet, bedpan, or urinal?: A Little Help from another person bathing (including washing, rinsing, drying)?: A Little Help from another person to put on and taking off regular upper body clothing?: A Little Help from another person to put on and taking off regular lower body clothing?: A Little 6 Click Score: 19   End of Session    Activity Tolerance: Patient tolerated treatment well Patient left: in chair;with call bell/phone within reach;with family/visitor present  OT Visit Diagnosis: Pain Pain - Right/Left: Right Pain - part of body: Knee                Time: 0347-4259 OT Time Calculation (min): 15 min Charges:  OT General Charges $OT Visit: 1 Visit OT Evaluation $OT Eval Low Complexity: 1 Low G-Codes:     [place], OTR/L [phone] 03/02/2017  Ketura Sirek 03/02/2017, 12:45 PM

## 2017-03-02 NOTE — Progress Notes (Signed)
Discharge planning, spoke with patient at bedside. Have chosen [facility] for [facility] PT. Contacted [facility] for referral. Needs RW and 3n1, contacted [facility] to deliver to room. [phone]

## 2017-03-03 LAB — CBC
HCT: 29.1 % — ABNORMAL LOW (ref 36.0–46.0)
Hemoglobin: 9.5 g/dL — ABNORMAL LOW (ref 12.0–15.0)
MCH: 25 pg — ABNORMAL LOW (ref 26.0–34.0)
MCHC: 32.6 g/dL (ref 30.0–36.0)
MCV: 76.6 fL — ABNORMAL LOW (ref 78.0–100.0)
Platelets: 240 10*3/uL (ref 150–400)
RBC: 3.8 MIL/uL — ABNORMAL LOW (ref 3.87–5.11)
RDW: 14.8 % (ref 11.5–15.5)
WBC: 8.6 10*3/uL (ref 4.0–10.5)

## 2017-03-03 LAB — GLUCOSE, CAPILLARY
Glucose-Capillary: 277 mg/dL — ABNORMAL HIGH (ref 65–99)
Glucose-Capillary: 228 mg/dL — ABNORMAL HIGH (ref 65–99)

## 2017-03-03 NOTE — Progress Notes (Signed)
Subjective: 2 Days Post-Op Procedure(s) (LRB): RIGHT TOTAL KNEE ARTHROPLASTY (Right) Patient reports pain as good.  Progress with PT. Tolerating PO's. Denies SOB,CP,orcalf pain.  Objective: Vital signs in last 24 hours: Temp:  [97.8 F (36.6 C)-99 F (37.2 C)] 98 F (36.7 C) (10/20 0451) Pulse Rate:  [67-74] 67 (10/20 0451) Resp:  [16] 16 (10/20 0451) BP: (162-179)/(65-74) 177/74 (10/20 0451) SpO2:  [96 %-100 %] 100 % (10/20 0451)  Intake/Output from previous day: 10/19 0701 - 10/20 0700 In: 1510 [P.O.:960; I.V.:450; IV Piggyback:100] Out: 650 [Urine:650] Intake/Output this shift: No intake/output data recorded.   Recent Labs  03/02/17 0555 03/03/17 0618  HGB 9.8* 9.5*    Recent Labs  03/02/17 0555 03/03/17 0618  WBC 7.7 8.6  RBC 3.79* 3.80*  HCT 29.9* 29.1*  PLT 253 240    Recent Labs  03/02/17 0555  NA 131*  K 4.0  CL 95*  CO2 27  BUN 14  CREATININE 0.77  GLUCOSE 256*  CALCIUM 8.4*   No results for input(s): LABPT, INR in the last 72 hours.  Well nourished. Alert and oriented x3. RRR, Lungs clear, BS x4. Abdomen soft and non tender. Right Calf soft and non tender. Right knee dressing C/D/I. No DVT signs. Compartment soft. No signs of infection.  Right LE grossly neurovascular intact.  Assessment/Plan: 2 Days Post-Op Procedure(s) (LRB): RIGHT TOTAL KNEE ARTHROPLASTY (Right) D/c home Follow instructions Take medicine as directed F/u in office  Jakala Herford L 03/03/2017, 11:16 AM

## 2017-03-03 NOTE — Progress Notes (Signed)
Physical Therapy Treatment Patient Details Name: Kathy Duncan MRN: [id number] DOB: April 21, 1956 Today's Date: 03/03/2017    History of Present Illness Pt s/p R TKR and with hx of DM    PT Comments    Pt continues to progress slowly but steadily with mobility despite fatigue this am.  Reviewed home therex, don/doff KI and stairs with written instruction provided.   Follow Up Recommendations  Home health PT     Equipment Recommendations  Rolling walker with 5" wheels    Recommendations for Other Services OT consult     Precautions / Restrictions Precautions Precautions: Fall;Knee Required Braces or Orthoses: Knee Immobilizer - Right Knee Immobilizer - Right: Discontinue once straight leg raise with < 10 degree lag Restrictions Weight Bearing Restrictions: No Other Position/Activity Restrictions: WBAT    Mobility  Bed Mobility Overal bed mobility: Needs Assistance Bed Mobility: Sit to Supine       Sit to supine: Min assist   General bed mobility comments: CUes for sequence with min assist for R LE  Transfers Overall transfer level: Needs assistance Equipment used: Rolling walker (2 wheeled) Transfers: Sit to/from Stand Sit to Stand: Min guard         General transfer comment: cues for UE/LE placement  Ambulation/Gait Ambulation/Gait assistance: Min guard;Supervision Ambulation Distance (Feet): 75 Feet Assistive device: Rolling walker (2 wheeled) Gait Pattern/deviations: Step-to pattern;Decreased step length - right;Decreased step length - left;Shuffle;Trunk flexed Gait velocity: decr Gait velocity interpretation: Below normal speed for age/gender General Gait Details: cues for sequence, posture and position from RW   Stairs Stairs: Yes   Stair Management: No rails;Step to pattern;Backwards;With walker Number of Stairs: 2 General stair comments: cues for sequence and foot/RW placement  Wheelchair Mobility    Modified Rankin (Stroke Patients  Only)       Balance                                            Cognition Arousal/Alertness: Awake/alert Behavior During Therapy: WFL for tasks assessed/performed Overall Cognitive Status: Within Functional Limits for tasks assessed                                        Exercises Total Joint Exercises Ankle Circles/Pumps: AROM;Both;15 reps;Supine Quad Sets: AROM;Both;Supine;15 reps Heel Slides: AAROM;15 reps;Supine;Right Straight Leg Raises: AAROM;AROM;Right;15 reps;Supine    General Comments        Pertinent Vitals/Pain Pain Assessment: 0-10 Pain Score: 6  Pain Location: R knee Pain Descriptors / Indicators: Aching;Sore Pain Intervention(s): Limited activity within patient's tolerance;Monitored during session;Premedicated before session;Ice applied    Home Living                      Prior Function            PT Goals (current goals can now be found in the care plan section) Acute Rehab PT Goals Patient Stated Goal: Regain IND PT Goal Formulation: With patient Time For Goal Achievement: 03/05/17 Potential to Achieve Goals: Good Progress towards PT goals: Progressing toward goals    Frequency    7X/week      PT Plan Current plan remains appropriate    Co-evaluation              AM-PAC PT "6 Clicks"  Daily Activity  Outcome Measure  Difficulty turning over in bed (including adjusting bedclothes, sheets and blankets)?: Unable Difficulty moving from lying on back to sitting on the side of the bed? : Unable Difficulty sitting down on and standing up from a chair with arms (e.g., wheelchair, bedside commode, etc,.)?: Unable Help needed moving to and from a bed to chair (including a wheelchair)?: A Little Help needed walking in hospital room?: A Little Help needed climbing 3-5 steps with a railing? : A Little 6 Click Score: 12    End of Session Equipment Utilized During Treatment: Gait belt Activity  Tolerance: Patient tolerated treatment well Patient left: in bed;with call bell/phone within reach Nurse Communication: Mobility status PT Visit Diagnosis: Difficulty in walking, not elsewhere classified (R26.2)     Time: 3016-0109 PT Time Calculation (min) (ACUTE ONLY): 35 min  Charges:  $Gait Training: 8-22 mins $Therapeutic Exercise: 8-22 mins                    G Codes:       Pg [phone]    Caidin Heidenreich 03/03/2017, 1:19 PM

## 2017-03-03 NOTE — Care Management Note (Signed)
Case Management Note  Patient Details  Name: Kathy Duncan MRN: [id number] Date of Birth: 08-02-1955  Subjective/Objective:     Right TKA               Action/Plan: Discharge Planning: NCM spoke to pt and states she did not receive RW and 3n1 bedside commode. Contacted [facility] for RW and 3n1 bedside commode. [name] arranged with [facility].   PCP Kinnie Feil MD  Expected Discharge Date:  03/03/17               Expected Discharge Plan:  [place]  In-House Referral:  NA  Discharge planning Services  CM Consult  Post Acute Care Choice:  Durable Medical Equipment, Home Health Choice offered to:  Patient  DME Arranged:  3-N-1, Walker rolling DME Agency:  [place]:  PT [place] Agency:  [facility] ([facility])  Status of Service:  Completed, signed off  If discussed at [employer] of Stay Meetings, dates discussed:    Additional Comments:  Erenest Rasher, RN 03/03/2017, 12:20 PM

## 2017-03-03 NOTE — Progress Notes (Signed)
OT Cancellation Note  Patient Details Name: Kathy Duncan MRN: [id number] DOB: [birth year]   Cancelled Treatment:     Pt very sleepy. Brought tub bench to show her, and demonstrated its use.  Pt verbalizes; didn't feel I needed to come back later for her to practice.  Will sign off.  Merrick Feutz 03/03/2017, 10:09 AM

## 2017-03-03 NOTE — Progress Notes (Signed)
Discharged from floor via w/c for transport home by car. Belongings & family with pt. No changes in assessment. Mirely Pangle  

## 2017-03-03 NOTE — Discharge Summary (Signed)
Physician Discharge Summary  Patient ID: Kathy Duncan MRN: [id number] DOB/AGE: 22-May-1955 61 y.o.  Admit date: 03/01/2017 Discharge date: 03/03/2017  Admission Diagnoses:  Discharge Diagnoses:  Principal Problem:   Primary osteoarthritis of right knee Active Problems:   Right knee DJD   Discharged Condition: good  Hospital Course:  Kathy Duncan is a 61 y.o. who was admitted to [facility]. They were brought to the operating room on 03/01/2017 and underwent Procedure(s): RIGHT TOTAL KNEE ARTHROPLASTY.  Patient tolerated the procedure well and was later transferred to the recovery room and then to the orthopaedic floor for postoperative care.  They were given PO and IV analgesics for pain control following their surgery.  They were given 24 hours of postoperative antibiotics of  Anti-infectives    Start     Dose/Rate Route Frequency Ordered Stop   03/01/17 1730  ceFAZolin (ANCEF) IVPB 2g/100 mL premix     2 g 200 mL/hr over 30 Minutes Intravenous Every 6 hours 03/01/17 1521 03/02/17 0600   03/01/17 1157  polymyxin B 500,000 Units, bacitracin 50,000 Units in sodium chloride 0.9 % 500 mL irrigation  Status:  Discontinued       As needed 03/01/17 1157 03/01/17 1334   03/01/17 0800  ceFAZolin (ANCEF) IVPB 2g/100 mL premix     2 g 200 mL/hr over 30 Minutes Intravenous On call to O.R. 03/01/17 5427 03/01/17 1125     and started on DVT prophylaxis in the form of lovenox.   PT and OT were ordered for total joint protocol.  Discharge planning consulted to help with postop disposition and equipment needs.  Patient had a good night on the evening of surgery and started to get up OOB with therapy on day one.  Hemovac drain was pulled without difficulty.  Continued to work with therapy into day two.  Dressing was with normal limits.  The patient had progressed with therapy and meeting their goals. Patient was seen in rounds and was ready to go home.  Consults:  n/a  Significant Diagnostic Studies: routine  Treatments: routine  Discharge Exam: Blood pressure (!) 177/74, pulse 67, temperature 98 F (36.7 C), temperature source Oral, resp. rate 16, height 5\' 7"  (1.702 m), weight 90.7 kg (200 lb), SpO2 100 %. Well nourished. Alert and oriented x3. RRR, Lungs clear, BS x4. Abdomen soft and non tender. Right Calf soft and non tender. Right knee dressing C/D/I. No DVT signs. Compartment soft. No signs of infection.  Right LE grossly neurovascular intact.  Disposition: 01-Home or Self Care   Allergies as of 03/03/2017      Reactions   Ace Inhibitors Cough      Medication List    STOP taking these medications   AURORA LANCET THIN 23G Misc   gabapentin 300 MG capsule Commonly known as:  NEURONTIN   OMEGA 3 PO   ONE TOUCH ULTRA TEST test strip Generic drug:  glucose blood     TAKE these medications   aspirin EC 325 MG tablet Take 1 tablet (325 mg total) by mouth 2 (two) times daily. What changed:  medication strength  how much to take  when to take this   B-D INS SYR ULTRAFINE 1CC/30G 30G X 1/2" 1 ML Misc Generic drug:  Insulin Syringe-Needle U-100 USE TO INJECT INSULIN DAILY What changed:  Another medication with the same name was removed. Continue taking this medication, and follow the directions you see here.   docusate sodium 100 MG capsule Commonly known as:  COLACE Take 1 capsule (100 mg total) by mouth 2 (two) times daily as needed for mild constipation.   ferrous sulfate 325 (65 FE) MG tablet Take 1 tablet (325 mg total) by mouth daily with breakfast.   insulin glargine 100 UNIT/ML injection Commonly known as:  LANTUS INJECT 40 UNITS INTO THE SKIN DAILY   losartan-hydrochlorothiazide 100-25 MG tablet Commonly known as:  HYZAAR Take 1 tablet by mouth daily.   lovastatin 40 MG tablet Commonly known as:  MEVACOR TAKE 1 TABLET (40 MG TOTAL) BY MOUTH AT BEDTIME.   metFORMIN 1000 MG tablet Commonly known as:   GLUCOPHAGE TAKE 1 TABLET (1,000 MG TOTAL) BY MOUTH 2 (TWO) TIMES DAILY WITH A MEAL.   methocarbamol 500 MG tablet Commonly known as:  ROBAXIN Take 1 tablet (500 mg total) by mouth every 6 (six) hours as needed for muscle spasms.   oxyCODONE-acetaminophen 5-325 MG tablet Commonly known as:  PERCOCET Take 1-2 tablets by mouth every 4 (four) hours as needed for severe pain.   polyethylene glycol packet Commonly known as:  MIRALAX / GLYCOLAX Take 17 g by mouth daily.            Durable Medical Equipment        Start     Ordered   03/02/17 1123  For home use only DME Walker rolling  Once    Question:  Patient needs a walker to treat with the following condition  Answer:  S/P knee surgery   03/02/17 1123     Follow-up Information    Susa Day, MD Follow up in 2 week(s).   Specialty:  Orthopedic Surgery Contact information: [address] [place] [address] [phone]        Home[facility] Follow up.   Specialty:  Home Health Services Why:  physical therapy Contact information: [address] [place] [address] [place] Follow up.   Why:  walker and bedside commode Contact information: [place] [address] [phone]           Signed: Lajean Manes 03/03/2017, 11:15 AM

## 2017-03-04 DIAGNOSIS — E119 Type 2 diabetes mellitus without complications: Secondary | ICD-10-CM | POA: Diagnosis not present

## 2017-03-04 DIAGNOSIS — M1991 Primary osteoarthritis, unspecified site: Secondary | ICD-10-CM | POA: Diagnosis not present

## 2017-03-04 DIAGNOSIS — Z471 Aftercare following joint replacement surgery: Secondary | ICD-10-CM | POA: Diagnosis not present

## 2017-03-06 DIAGNOSIS — Z471 Aftercare following joint replacement surgery: Secondary | ICD-10-CM | POA: Diagnosis not present

## 2017-03-06 DIAGNOSIS — E119 Type 2 diabetes mellitus without complications: Secondary | ICD-10-CM | POA: Diagnosis not present

## 2017-03-06 DIAGNOSIS — M1991 Primary osteoarthritis, unspecified site: Secondary | ICD-10-CM | POA: Diagnosis not present

## 2017-03-06 NOTE — Op Note (Signed)
Kathy Duncan, Kathy Duncan         ACCOUNT NO.:  [id number]  MEDICAL RECORD NO.:  [id number]  LOCATION:  43                         FACILITY:  [facility]  PHYSICIAN:  Susa Day, M.D.    DATE OF BIRTH:  April 19, 1956  DATE OF PROCEDURE:  03/01/2017 DATE OF DISCHARGE:                              OPERATIVE REPORT   PREOPERATIVE DIAGNOSIS:  End-stage osteoarthritis of the right knee with a varus deformity.  POSTOPERATIVE DIAGNOSIS:  End-stage osteoarthritis of the right knee with a varus deformity.  PROCEDURES PERFORMED:  Right total knee arthroplasty utilizing DePuy Attune rotating platform.  COMPONENTS:  The patient had a #5 femur, 6 tibia, 7 mm insert, 38 patella.  ANESTHESIA:  Spinal with an adductor block.  ASSISTANT:  Lacie Draft, PA.  BRIEF HISTORY:  This is a very pleasant, 61 year old female who has end- stage osteoarthritis of the right knee, significant disabling effect to her activities of daily living, refractory to conservative treatment, bone-on-bone arthritis, indicated for replacement of the degenerated joint.  Risks and benefits discussed including bleeding, infection, damage to neurovascular structures, no change in symptoms, worsening symptoms, DVT, PE, anesthetic complications, etc.  TECHNIQUE:  With the patient in supine position after induction of adequate spinal anesthesia, the right lower extremity was prepped, draped, and exsanguinated in usual sterile fashion.  Thigh tourniquet inflated to 250 mmHg.  She had preoperative 2 g of Kefzol.  A midline incision was then made over the skin over the right knee.  Full- thickness flaps developed.  Median parapatellar arthrotomy performed. Patella everted.  Knee flexed.  Tricompartmental osteoarthrosis was noted, particularly in the medial compartment.  Remnants of the medial and lateral menisci were removed with a Leksell rongeur.  We elevated soft tissues medially, preserving the MCL and the deep  attachment.  A step drill was utilized in the femoral canal.  It was irrigated.  A 5- degree right was selected.  With 9 off the distal femur, this was pinned.  Oscillating saw was utilized to perform the distal femoral cut. This was then sized off the anterior cortex to a 5 just off the anterior cortex.  This was then pinned in 3 degrees of external rotation.  Used a 5 block, checked anteriorly.  We did not notch the cortex, pinned it, performed anterior and posterior and chamfer cuts with soft tissues protected posteriorly at all times.  Following this, we then subluxed the tibia, used our external alignment guide.  Bone-on-bone was noted medially and posteromedially as a defect.  External alignment guide 2 off the defect, which was medially bisecting the tibiotalar joint parallel to the shaft, 3-degree slope.  We then performed a neck cut.  I checked my extension gap and it was satisfactory at a 6.  I then removed that, flexed the knee, and then completed a box cut, bisecting the canal.  Again, protecting the soft tissues posteriorly at all times.  Next, subluxed the tibia, measured it to a 6, maximizing the coverage just the medial aspect of the tibial tubercle.  This was pinned.  We drilled centrally, utilized a punch guide.  Following this, we used the trial femur and drilled our lug holes.  Trial tibia and a 6 mm insert, slight  hyperextension was noted.  I then everted the patella, measured it to a 25, planed it to a 15 utilizing a patellar jig.  This was then sized to a 38, medializing our peg holes which were drilled.  I then placed a trial patella, reduced it, and had excellent patellofemoral tracking.  Good stability of varus and valgus stressing, 0 to 30 degrees.  Negative Anterior Drawer.  I then removed all trial components, checked posteriorly, capsule was intact as was the popliteus.  We cauterized any geniculates.  Copiously irrigated with pulsatile lavage.  Flexed the  knee, dried all surfaces thoroughly, mixed the cement on the back table in appropriate fashion, injected into the tibial canal, digitally pressurizing it.  We then impacted the cemented tibial tray, redundant cement removed.  We cemented and impacted the femur, redundant cement removed.  Placed a trial 7 insert, reduced it, and held axial load throughout the curing of the cement.  We cemented and clamped the patella.  0.25% Marcaine with epinephrine was infiltrated in the periosteal tissues.  Antibiotic irrigation was placed in the wound.  After appropriate curing of the cement, tourniquet was deflated, approximately 55 minutes.  Any bleeding was cauterized.  Next, we flexed the knee.  Any redundant cement was removed with an osteotome and again with reduction and towel clamps, she had excellent patellofemoral tracking.  I removed the insert, checked posteriorly, again redundant cement removed.  Copiously irrigated with antibiotic irrigation.  I placed a permanent insert, 7, reduced it, had full extension, full flexion, good stability, varus and valgus stressing 0 to 30 degrees.  Negative Anterior Drawer.  I then reapproximated the patellar arthrotomy with #1 Vicryl interrupted figure-of-eight sutures. Irrigated the wound, over sewed it with Stratafix.  Subcu with 2-0 and skin with Prolene.  Sterile dressing applied.  She had, prior to that, flexion to gravity 90 degrees and excellent patellofemoral tracking. Good stability, varus and valgus stressing 0 to 30 degrees.  Negative Anterior Drawer.  She had minimal blood loss.     Susa Day, M.D.     Geralynn Rile  D:  03/06/2017  T:  03/06/2017  Job:  [id number]

## 2017-03-08 DIAGNOSIS — E119 Type 2 diabetes mellitus without complications: Secondary | ICD-10-CM | POA: Diagnosis not present

## 2017-03-08 DIAGNOSIS — M1991 Primary osteoarthritis, unspecified site: Secondary | ICD-10-CM | POA: Diagnosis not present

## 2017-03-08 DIAGNOSIS — Z471 Aftercare following joint replacement surgery: Secondary | ICD-10-CM | POA: Diagnosis not present

## 2017-03-12 DIAGNOSIS — M1991 Primary osteoarthritis, unspecified site: Secondary | ICD-10-CM | POA: Diagnosis not present

## 2017-03-12 DIAGNOSIS — Z471 Aftercare following joint replacement surgery: Secondary | ICD-10-CM | POA: Diagnosis not present

## 2017-03-12 DIAGNOSIS — E119 Type 2 diabetes mellitus without complications: Secondary | ICD-10-CM | POA: Diagnosis not present

## 2017-03-14 DIAGNOSIS — Z471 Aftercare following joint replacement surgery: Secondary | ICD-10-CM | POA: Diagnosis not present

## 2017-03-14 DIAGNOSIS — E119 Type 2 diabetes mellitus without complications: Secondary | ICD-10-CM | POA: Diagnosis not present

## 2017-03-14 DIAGNOSIS — M1991 Primary osteoarthritis, unspecified site: Secondary | ICD-10-CM | POA: Diagnosis not present

## 2017-03-15 DIAGNOSIS — Z96651 Presence of right artificial knee joint: Secondary | ICD-10-CM | POA: Diagnosis not present

## 2017-03-15 DIAGNOSIS — Z471 Aftercare following joint replacement surgery: Secondary | ICD-10-CM | POA: Diagnosis not present

## 2017-03-18 ENCOUNTER — Other Ambulatory Visit: Payer: Self-pay | Admitting: Family Medicine

## 2017-03-20 DIAGNOSIS — M25561 Pain in right knee: Secondary | ICD-10-CM | POA: Diagnosis not present

## 2017-03-23 DIAGNOSIS — M25561 Pain in right knee: Secondary | ICD-10-CM | POA: Diagnosis not present

## 2017-03-28 DIAGNOSIS — M25561 Pain in right knee: Secondary | ICD-10-CM | POA: Diagnosis not present

## 2017-03-30 DIAGNOSIS — M25561 Pain in right knee: Secondary | ICD-10-CM | POA: Diagnosis not present

## 2017-04-11 DIAGNOSIS — M25561 Pain in right knee: Secondary | ICD-10-CM | POA: Diagnosis not present

## 2017-04-12 DIAGNOSIS — Z96651 Presence of right artificial knee joint: Secondary | ICD-10-CM | POA: Diagnosis not present

## 2017-04-12 DIAGNOSIS — Z471 Aftercare following joint replacement surgery: Secondary | ICD-10-CM | POA: Diagnosis not present

## 2017-04-13 DIAGNOSIS — M25561 Pain in right knee: Secondary | ICD-10-CM | POA: Diagnosis not present

## 2017-04-18 DIAGNOSIS — M25561 Pain in right knee: Secondary | ICD-10-CM | POA: Diagnosis not present

## 2017-05-02 DIAGNOSIS — M25561 Pain in right knee: Secondary | ICD-10-CM | POA: Diagnosis not present

## 2017-05-09 DIAGNOSIS — M25561 Pain in right knee: Secondary | ICD-10-CM | POA: Diagnosis not present

## 2017-05-17 DIAGNOSIS — M25561 Pain in right knee: Secondary | ICD-10-CM | POA: Insufficient documentation

## 2017-05-22 DIAGNOSIS — M25561 Pain in right knee: Secondary | ICD-10-CM | POA: Diagnosis not present

## 2017-05-25 DIAGNOSIS — M25512 Pain in left shoulder: Secondary | ICD-10-CM | POA: Diagnosis not present

## 2017-05-25 DIAGNOSIS — M7542 Impingement syndrome of left shoulder: Secondary | ICD-10-CM | POA: Diagnosis not present

## 2017-06-12 ENCOUNTER — Other Ambulatory Visit: Payer: Self-pay | Admitting: Family Medicine

## 2017-07-02 NOTE — Progress Notes (Signed)
[facility] Phone: [phone]   Date of Visit: 07/03/2017   HPI:  DM2:  - Lantus 40 qhs and Metformin  - cbgs: AM fasting 80-100. Mainly in the low 100s. Had one low at 70 with hypoglycemia symptoms. In the past three months only had one low. PM at 7pm- 108-110, before bed: 130 - no polyuria or polydipsia  - does not eat right. He weakness is chocolate.  - reports she has not been doing well with her diet because of some financial stress.  But this has improved and she is motivated to improve her diet - last A1c 7.1 in November 2018  Possible UTI symptoms: Patient reports that she thinks she might have UTI symptoms -Reports that she had a knee surgery in October 2018 for which she had a urinary catheter placed.  Since then she reports of issues with urination -She denies any dysuria or hematuria and denies any urinary retention -She reports that she has pain in her lower abdomen intermittently that lasts about 5 minutes at a time since October -She reports that she has urinary frequency and urgency.  And sometimes has incontinence because she cannot make to the bathroom in time.  This occurs maybe about twice a week.  She wears a pad for this.  Vaginal spotting: -She reports of vaginal spotting on and off "for a while".  She notices minimal spotting on her pad or sometimes when she wipes. -Per chart review in 2015 she was seen in clinic for vaginal spotting.  At that time she was on tamoxifen for breast cancer.  PCP thought possibly due to tamoxifen but wanted a second opinion and referred her to gynecology. -She had a hysterectomy in 2001 for some traumatic uterine fibroids.  This was a total abdominal hysterectomy and salpingectomy.  -She reports that she was seen by gynecology in 2015 and there was no cause found for her symptoms.   ROS: See HPI.  [place]:  PMH: HTN DM2 with neuropathy  Bilateral carpal tunnel syndrome  HLD Obesity   PHYSICAL  EXAM: BP 110/70   Pulse 95   Temp 98.1 F (36.7 C) (Oral)   Wt 206 lb (93.4 kg)   SpO2 98%   BMI 32.26 kg/m  GEN: NAD CV: RRR, no murmurs, rubs, or gallops PULM: CTAB, normal effort ABD: Soft, nontender, nondistended, NABS, no organomegaly Female genitalia: Cervix is not seen entheses she has had a total abdominal hysterectomy).  On speculum exam there is significant easily friable mucosa.  There are no masses that I appreciated.  Normal bimanual exam. SKIN: No rash or cyanosis; warm and well-perfused EXTR: No lower extremity edema or calf tenderness PSYCH: Mood and affect euthymic, normal rate and volume of speech NEURO: Awake, alert, no focal deficits grossly, normal speech  ASSESSMENT/PLAN:  Abnormal vaginal bleeding No masses or polyps noted on exam.  The vaginal mucosa is easily friable.  Discussed with attending who agrees with referral to gynecology for further evaluation.  Diabetes mellitus type 2, controlled ([place]) Appears to be controlled well. She has had one low in the past 3 months but it seems like she is worried that she might have lows overnight and may eat more because of it.  We discussed decreasing Lantus to 38 units nightly.  She is to continue to work on her diet and start regular exercise.  She is to call if she has lows or persistently elevated CBGs with this new change.  A1c ordered today.  Follow-up would likely depend on this.  For now we will say follow-up in 3 months.  Acute Cystitis:  It is odd that she has had symptoms since October but her UA is consistent with urinary tract infection.  No signs of pyelonephritis.  We will treat her with Keflex 500 4 times daily for 5 days.  We will add urine culture.  Urinary incontinence: It appears to be urge incontinence and rarely stress incontinence.  It seems like she waits too long to use the restroom when she has these episodes of incontinence.  Discussed scheduled voids and trial of [place] exercises.  Follow-up  if symptoms do not resolve.  Smiley Houseman, MD PGY [place]

## 2017-07-03 ENCOUNTER — Ambulatory Visit: Payer: Commercial Managed Care - PPO | Admitting: Internal Medicine

## 2017-07-03 ENCOUNTER — Encounter: Payer: Self-pay | Admitting: Internal Medicine

## 2017-07-03 ENCOUNTER — Other Ambulatory Visit: Payer: Self-pay

## 2017-07-03 VITALS — BP 110/70 | HR 95 | Temp 98.1°F | Wt 206.0 lb

## 2017-07-03 DIAGNOSIS — N939 Abnormal uterine and vaginal bleeding, unspecified: Secondary | ICD-10-CM | POA: Diagnosis not present

## 2017-07-03 DIAGNOSIS — N3000 Acute cystitis without hematuria: Secondary | ICD-10-CM | POA: Diagnosis not present

## 2017-07-03 DIAGNOSIS — E118 Type 2 diabetes mellitus with unspecified complications: Secondary | ICD-10-CM | POA: Diagnosis not present

## 2017-07-03 DIAGNOSIS — R300 Dysuria: Secondary | ICD-10-CM | POA: Diagnosis not present

## 2017-07-03 DIAGNOSIS — N3941 Urge incontinence: Secondary | ICD-10-CM

## 2017-07-03 DIAGNOSIS — Z794 Long term (current) use of insulin: Secondary | ICD-10-CM

## 2017-07-03 LAB — POCT URINALYSIS DIP (MANUAL ENTRY)
Bilirubin, UA: NEGATIVE
Glucose, UA: NEGATIVE mg/dL
Ketones, POC UA: NEGATIVE mg/dL
Nitrite, UA: NEGATIVE
Spec Grav, UA: 1.015 (ref 1.010–1.025)
Urobilinogen, UA: 0.2 E.U./dL
pH, UA: 7.5 (ref 5.0–8.0)

## 2017-07-03 LAB — POCT UA - MICROSCOPIC ONLY: WBC, Ur, HPF, POC: >20

## 2017-07-03 MED ORDER — CEPHALEXIN 500 MG PO CAPS: 500.0000 mg | ORAL_CAPSULE | Freq: Four times a day (QID) | ORAL | 0 refills | Status: AC

## 2017-07-03 NOTE — Patient Instructions (Addendum)
Decrease Lantus to 38 units to avoid low sugars.  Please monitor your sugars. Your goal is between 80 to 130. Please let me know if you sugars are consistently above 140. We will get an A1c today   We will refer you to gynecology for your spotting.  I will call you about your urine test.

## 2017-07-03 NOTE — Assessment & Plan Note (Signed)
Appears to be controlled well. She has had one low in the past 3 months but it seems like she is worried that she might have lows overnight and may eat more because of it.  We discussed decreasing Lantus to 38 units nightly.  She is to continue to work on her diet and start regular exercise.  She is to call if she has lows or persistently elevated CBGs with this new change.  A1c ordered today.  Follow-up would likely depend on this.  For now we will say follow-up in 3 months.

## 2017-07-03 NOTE — Assessment & Plan Note (Signed)
No masses or polyps noted on exam.  The vaginal mucosa is easily friable.  Discussed with attending who agrees with referral to gynecology for further evaluation.

## 2017-07-04 ENCOUNTER — Telehealth: Payer: Self-pay | Admitting: Internal Medicine

## 2017-07-04 LAB — HEMOGLOBIN A1C
Est. average glucose Bld gHb Est-mCnc: 260 mg/dL
Hgb A1c MFr Bld: 10.7 % — ABNORMAL HIGH (ref 4.8–5.6)

## 2017-07-04 NOTE — Telephone Encounter (Signed)
Called patient to discuss elevated hemoglobin A1c to 10.7 from 7.9 in September 2018.  She is currently on Lantus and metformin.  We discussed how her CBGs that she reported were not quite consistent with her A1c level.  I also tried to explain to her that I think she would need something other than Lantus and metformin to control her sugars.  She does report that she has not been eating well and has not been physically active.  I told her that this does contribute to the elevated CBGs but I feel that she might need mealtime NovoLog at least starting off with her biggest meal of the day.  She does not want to do this she just asked if we could increase her Lantus even more.  She is currently on 38 units of Lantus and I told her that increasing her Lantus only will likely not get her sugars under control.  I also discussed with her other options such as GLP-1 agonist and SGL T2 inhibitors but patient is not interested in this.  I asked her if she would be willing to see our pharmacist Dr. Valentina Lucks to discuss diabetes in more detail.  She reports she is agreeable to doing this.  I made a appointment with Dr. Valentina Lucks tomorrow at 10 AM.  Will forward this message to inform Dr. Valentina Lucks.

## 2017-07-05 ENCOUNTER — Encounter: Payer: Self-pay | Admitting: Pharmacist

## 2017-07-05 ENCOUNTER — Ambulatory Visit: Payer: Commercial Managed Care - PPO | Admitting: Pharmacist

## 2017-07-05 DIAGNOSIS — E118 Type 2 diabetes mellitus with unspecified complications: Secondary | ICD-10-CM | POA: Diagnosis not present

## 2017-07-05 DIAGNOSIS — I10 Essential (primary) hypertension: Secondary | ICD-10-CM | POA: Diagnosis not present

## 2017-07-05 DIAGNOSIS — Z794 Long term (current) use of insulin: Secondary | ICD-10-CM

## 2017-07-05 LAB — URINE CULTURE

## 2017-07-05 MED ORDER — PEN NEEDLES 32G X 6 MM MISC: 1.0000 | Freq: Every day | 3 refills | Status: DC

## 2017-07-05 MED ORDER — LIRAGLUTIDE 18 MG/3ML ~~LOC~~ SOPN: PEN_INJECTOR | SUBCUTANEOUS | 2 refills | Status: DC

## 2017-07-05 MED ORDER — INSULIN GLARGINE 100 UNIT/ML ~~LOC~~ SOLN: SUBCUTANEOUS | 3 refills | Status: DC

## 2017-07-05 NOTE — Assessment & Plan Note (Signed)
Diabetes longstanding currently uncontrolled evidenced by last A1c of 10.7 (07/03/17), an increase from last reading of 7.1 five months ago. Patient reports hypoglycemic events and is able to verbalize appropriate hypoglycemia management plan. Patient reports adherence with medication. Control is suboptimal due to dietary indiscretions and sedentary lifestyle. Start Victoza 0.6 mg daily beginning as soon as she receives medication from [facility]. She is to titrate Victoza dose to 1.2 mg one week after initiation, then to 1.8 mg daily by the third week after initiation. Victoza prescription and needle tips prescription sent to [facility]. Decrease basal insulin Lantus (insulin glargine) to 35 units daily given addition of Victoza and low CBG of 73 this morning. Patient to call pharmacy clinic if she sees multiple CBG readings <100. No refills on Lantus needed since patient has 3 vials remaining. Anticipate change in Lantus therapy in the future secondary to formulary change. Patient encouraged to check CBGs a couple of times a day. Also encouraged her to restart physical activity to help with weight loss. Will have her return to pharmacy clinic in 3 weeks for follow-up. Next A1C anticipated May 2019.

## 2017-07-05 NOTE — Assessment & Plan Note (Signed)
Hypertension longstanding likely well-controlled given her reading today of 124/88 mmHg was only slightly elevated from her reading yesterday of 110/70 mmHg. Patient reports adherence with medication. No changes to therapy at this time.

## 2017-07-05 NOTE — Progress Notes (Signed)
   Subjective:    Patient ID: Kathy Duncan, female    DOB: 1955-12-20, 62 y.o.   MRN: [id number]  HPI    Review of Systems     Objective:   Physical Exam        Assessment & Plan:

## 2017-07-05 NOTE — Progress Notes (Signed)
S:     Chief Complaint  Patient presents with  . Medication Management    T2DM    Patient arrives in good spirits ambulating without assistance.  Presents for diabetes evaluation, education, and management at the request of Dr. Dallas Schimke after visit on 07/03/18. Patient was last seen by Primary Care Provider Dr. Gwendlyn Deutscher 02/02/17. Reports CBGs 70-310s. States "chocolate is her weakness".  Patient reports diabetes was diagnosed 30 years ago and has been taking Lantus for the past 15 years. She expresses interest in glucose control and restarting to walk to help with weight loss. She states her goal weight is 180 lbs. She reports she cut out chocolate recently to help with her efforts and expresses interest in adding on another medication to help with glucose control and weight loss.   Patient reports adherence with medications.  Current diabetes medications include: Lantus 40 units daily, Metformin 1000 mg BID Current hypertension medications include: losartan/HCTZ 100-25mg   Patient reports hypoglycemic events; states 72 before clinic visit this morning. She generally "feels bad" with CBGs of <80.   Patient reported dietary habits: Eats 3 meals/day with snacks in between Lunch:largest meal, sandwiches, fast food Snacks:endorses eating several snacks consisting of chocolate, potato chips, candy, crackers Drinks:water, green tea  Patient reported exercise habits: was previously active walking but stopped since recent knee replacement. Aspires to begin walking again 4x/week for 15 minutes and attending free exercise classes.    Patient reports intermittent nocturia, but drinks a lot of liquid throughout the day  Patient reports mild intermittent neuropathy for last 2-3 years for which she generally doesn't take any medications, but does endorse the occasional acetaminophen 1-2 tablets every several weeks Patient denies visual changes. Patient reports self foot exams.   O:  Physical Exam    Constitutional: She appears well-developed and well-nourished.   Review of Systems  All other systems reviewed and are negative.   Lab Results  Component Value Date   HGBA1C 10.7 (H) 07/03/2017   Vitals:   07/05/17 1034  BP: 124/88    Home fasting CBG: Patient does not monitor sugar consistently, usually only checks when she feels bad. CBG Random: reports a reading of 300 one afternoon  10 year ASCVD risk: >7.5%  A/P: Diabetes longstanding currently uncontrolled evidenced by last A1c of 10.7 (07/03/17), an increase from last reading of 7.1 five months ago. Patient reports hypoglycemic events and is able to verbalize appropriate hypoglycemia management plan. Patient reports adherence with medication. Control is suboptimal due to dietary indiscretions and sedentary lifestyle. Start Victoza 0.6 mg daily beginning as soon as she receives medication from [facility]. She is to titrate Victoza dose to 1.2 mg one week after initiation, then to 1.8 mg daily by the third week after initiation. Victoza prescription and needle tips prescription sent to [facility]. Decrease basal insulin Lantus (insulin glargine) to 35 units daily given addition of Victoza and low CBG of 73 this morning. Patient to call pharmacy clinic if she sees multiple CBG readings <100. No refills on Lantus needed since patient has 3 vials remaining. Anticipate change in Lantus therapy in the future secondary to formulary change. Patient encouraged to check CBGs a couple of times a day. Also encouraged her to restart physical activity to help with weight loss. Will have her return to pharmacy clinic in 3 weeks for follow-up. Next A1C anticipated May 2019.  ASCVD risk greater than 7.5%. Continued Aspirin 81 mg and Continued lovastatin 40 statin mg at bedtime.  Hypertension longstanding likely well-controlled given her reading today of 124/88 mmHg was only slightly elevated from her reading yesterday of 110/70 mmHg. Patient reports  adherence with medication. No changes to therapy at this time.  Written patient instructions provided.  Total time in face to face counseling 30 minutes. Follow up in Pharmacist Clinic Visit 3 weeks.     Patient seen with Onnie Boer, PharmD Candidate, Leroy Libman, PharmD, and Deirdre Pippins, PGY2 Pharmacy Resident, PharmD, BCPS.

## 2017-07-05 NOTE — Progress Notes (Signed)
Patient ID: Kathy Duncan, female   DOB: 1955-07-13, 62 y.o.   MRN: [id number] Reviewed: Agree with Dr. Graylin Shiver documentation and management.

## 2017-07-05 NOTE — Patient Instructions (Addendum)
Nice to meet you!   Goal is to walk four days a week for at least 1 mile (15 minutes).   START Victoza Week 1 = 0.6 mg once a day Week 2 = 1.2 mg once a day Week 3 = 1.8 mg once a day Call us if you experience nausea, vomiting, or upset stomach and we can adjust your dose.  Decrease your lantus to 35 units once a day. Call us if you see numbers below 100 several times.   Follow up in Rx clinic in 3 weeks.

## 2017-07-16 ENCOUNTER — Other Ambulatory Visit: Payer: Self-pay

## 2017-07-16 ENCOUNTER — Ambulatory Visit (INDEPENDENT_AMBULATORY_CARE_PROVIDER_SITE_OTHER): Payer: Commercial Managed Care - PPO | Admitting: Gynecology

## 2017-07-16 ENCOUNTER — Encounter: Payer: Self-pay | Admitting: Gynecology

## 2017-07-16 VITALS — BP 124/80 | Ht 66.5 in | Wt 206.0 lb

## 2017-07-16 DIAGNOSIS — N952 Postmenopausal atrophic vaginitis: Secondary | ICD-10-CM

## 2017-07-16 DIAGNOSIS — Z01411 Encounter for gynecological examination (general) (routine) with abnormal findings: Secondary | ICD-10-CM

## 2017-07-16 DIAGNOSIS — N939 Abnormal uterine and vaginal bleeding, unspecified: Secondary | ICD-10-CM

## 2017-07-16 LAB — WET PREP FOR TRICH, YEAST, CLUE

## 2017-07-16 NOTE — Patient Instructions (Signed)
Follow-up for the colposcopy appointment as scheduled. 

## 2017-07-16 NOTE — Progress Notes (Signed)
    Kathy Duncan 1955/07/07 [id number]        61 y.o.  G2P2 new patient who has not had an annual gynecologic exam in a number of years presents complaining of vaginal bleeding on and off.  She is status post abdominal hysterectomy at age 56 for leiomyoma and menorrhagia.  Still has her ovaries.  Notes a number of years ago she had some vaginal staining but this seemed to resolve until this past fall when she had knee surgery and she notes ever since then she is noticed some vaginal staining on and off.  No pain.  No hot flushes night sweats or vaginal irritation.  No vaginal discharge or odor.  Is not sexually active.  No urinary symptoms such as frequency dysuria urgency low back pain fever or chills.  No diarrhea constipation or issues with hemorrhoids.  Past medical history,surgical history, problem list, medications, allergies, family history and social history were all reviewed and documented as reviewed in the EPIC chart.  ROS:  Performed with pertinent positives and negatives included in the history, assessment and plan.   Additional significant findings : None   Exam: Caryn Bee assistant Vitals:   07/16/17 1146  BP: 124/80  Weight: 206 lb (93.4 kg)  Height: 5' 6.5" (1.689 m)   Body mass index is 32.75 kg/m.  General appearance:  Normal affect, orientation and appearance. Skin: Grossly normal HEENT: Without gross lesions.  No cervical or supraclavicular adenopathy. Thyroid normal.  Lungs:  Clear without wheezing, rales or rhonchi Cardiac: RR, without RMG Abdominal:  Soft, nontender, without masses, guarding, rebound, organomegaly or hernia Breasts:  Examined lying and sitting without masses, retractions, discharge or axillary adenopathy. Pelvic:  Ext, BUS, Vagina: With atrophic changes.  Generalized oozing with swabbing mid to upper third of the vagina with no specific lesions noted.  Pap smear of vagina taken.  Adnexa: Without masses or tenderness    Anus and  perineum: Normal   Rectovaginal: Normal sphincter tone without palpated masses or tenderness.    Assessment/Plan:  62 y.o. G2P2 new patient overdue for annual exam presenting also complaining of vaginal bleeding on and off.    1. With history of vaginal bleeding on and off for the last 5 months.  Status post hysterectomy for leiomyoma in the past.  Exam shows atrophic changes with general oozing at the upper part of the vagina.  No specific lesions visualized.  Pap smear performed around this area.  Recommend follow-up with colposcopy for better visualization to rule out any specific lesions or abnormalities.  If all consistent with atrophic changes I discussed with the patient possibilities to include vaginal estrogen supplementation.  Wet prep today showed no evidence of infection to account for the bleeding and irritation.  Patient will follow-up for the colposcopy appointment and then will go from there. 2. Mammography 11/2016.  Continue with annual mammography this coming summer.  Breast exam normal today. 3. DEXA many years ago.  Will plan follow-up DEXA in a year 2.  Increase calcium vitamin D reviewed. 4. Colonoscopy 2018.  Repeat at their recommended interval. 5. Health maintenance.  No routine lab work done as patient does this elsewhere.  Follow-up for colposcopy appointment.  Follow-up in 1 year for annual exam.  Additional time in excess of 10 minutes was spent in direct face to face counseling and coordination of care in regards to her vaginal bleeding.    Anastasio Auerbach MD, 12:08 PM 07/16/2017

## 2017-07-16 NOTE — Addendum Note (Signed)
Addended by: Nelva Nay on: 07/16/2017 12:36 PM   Modules accepted: Orders

## 2017-07-18 LAB — PAP IG W/ RFLX HPV ASCU

## 2017-07-18 MED ORDER — METFORMIN HCL 1000 MG PO TABS: ORAL_TABLET | ORAL | 0 refills | Status: DC

## 2017-07-18 MED ORDER — LOSARTAN POTASSIUM-HCTZ 100-25 MG PO TABS: 1.0000 | ORAL_TABLET | Freq: Every day | ORAL | 0 refills | Status: DC

## 2017-07-18 MED ORDER — LOVASTATIN 40 MG PO TABS: 40.0000 mg | ORAL_TABLET | Freq: Every day | ORAL | 0 refills | Status: DC

## 2017-07-19 ENCOUNTER — Other Ambulatory Visit: Payer: Self-pay | Admitting: Gynecology

## 2017-07-19 MED ORDER — METRONIDAZOLE 500 MG PO TABS: 500.0000 mg | ORAL_TABLET | Freq: Two times a day (BID) | ORAL | 0 refills | Status: DC

## 2017-07-20 LAB — URINALYSIS, COMPLETE W/RFL CULTURE
Bacteria, UA: NONE SEEN /HPF
Bilirubin Urine: NEGATIVE
Glucose, UA: NEGATIVE
Hyaline Cast: NONE SEEN /LPF
Ketones, ur: NEGATIVE
Nitrites, Initial: NEGATIVE
Protein, ur: NEGATIVE
Specific Gravity, Urine: 1.009 (ref 1.001–1.03)
WBC, UA: >=60 /HPF — AB (ref 0–5)
pH: 7.5 (ref 5.0–8.0)

## 2017-07-20 LAB — URINE CULTURE
MICRO NUMBER:: 90275090
Result:: NO GROWTH
SPECIMEN QUALITY:: ADEQUATE

## 2017-07-20 LAB — CULTURE INDICATED

## 2017-07-23 ENCOUNTER — Ambulatory Visit: Payer: Commercial Managed Care - PPO | Admitting: Gynecology

## 2017-07-23 ENCOUNTER — Encounter: Payer: Self-pay | Admitting: Gynecology

## 2017-07-23 VITALS — BP 122/80

## 2017-07-23 DIAGNOSIS — N939 Abnormal uterine and vaginal bleeding, unspecified: Secondary | ICD-10-CM

## 2017-07-23 DIAGNOSIS — N898 Other specified noninflammatory disorders of vagina: Secondary | ICD-10-CM | POA: Diagnosis not present

## 2017-07-23 DIAGNOSIS — A5901 Trichomonal vulvovaginitis: Secondary | ICD-10-CM

## 2017-07-23 NOTE — Progress Notes (Signed)
    Kathy Duncan 1955/08/14 [id number]        62 y.o.  G2P2 presents for colposcopy.  Was recently seen after a number of years of not having a GYN exam complaining of vaginal bleeding.  She is status post abdominal hysterectomy for leiomyoma and menorrhagia.  Exam showed generalized oozing from the mid to upper third of the vagina but no specific lesions.  She was recommended to follow-up for colposcopy to make sure there are no lesions visible.  Her Pap smear subsequently returned negative for intraepithelial neoplasia but did show trichomonas.  She was started on Flagyl 500 mg twice daily times 7 days.  Has not had intercourse in over 4 years and not currently sexually active nor plans to be.  No vaginal discharge irritation or odor historically.  Past medical history,surgical history, problem list, medications, allergies, family history and social history were all reviewed and documented in the EPIC chart.  Directed ROS with pertinent positives and negatives documented in the history of present illness/assessment and plan.  Exam: Caryn Bee assistant Vitals:   07/23/17 1053  BP: 122/80   General appearance:  Normal Abdomen soft nontender without masses guarding rebound Pelvic external BUS vagina grossly normal with mild atrophic changes.  No gross discharge.  Bimanual without masses or tenderness.  Colposcopy performed after acetic acid cleanse was normal noting mild irritative diffuse changes but no specific lesions or colposcopic abnormalities such as acetowhite change, mosaicism, punctations or atypical vessels.  The entire vaginal canal was inspected.  Assessment/Plan:  62 y.o. G2P2 with subclinical trichomonas infection evident on Pap smear which was otherwise negative for dysplasia/atypia..  Started on Flagyl.  Not sexually active nor current partners.  GC/Chlamydia screen was done today with her permission.  Exam without evidence of active infection.  Colposcopic exam showed  some generalized inflammatory changes which I think are secondary to her infection but no specific lesions to suggest neoplasia.  Recommend she complete her Flagyl course and follow-up if she does any further bleeding.  Otherwise will be reevaluated in 1 year.    Anastasio Auerbach MD, 11:46 AM 07/23/2017

## 2017-07-23 NOTE — Patient Instructions (Signed)
Follow-up if the vaginal bleeding continues.

## 2017-07-23 NOTE — Addendum Note (Signed)
Addended by: Nelva Nay on: 07/23/2017 11:55 AM   Modules accepted: Orders

## 2017-07-24 LAB — C. TRACHOMATIS/N. GONORRHOEAE RNA
C. trachomatis RNA, TMA: NOT DETECTED
N. gonorrhoeae RNA, TMA: NOT DETECTED

## 2017-07-26 ENCOUNTER — Ambulatory Visit: Payer: Commercial Managed Care - PPO | Admitting: Pharmacist

## 2017-08-07 DIAGNOSIS — M1711 Unilateral primary osteoarthritis, right knee: Secondary | ICD-10-CM | POA: Diagnosis not present

## 2017-08-07 DIAGNOSIS — M25561 Pain in right knee: Secondary | ICD-10-CM | POA: Diagnosis not present

## 2017-08-30 ENCOUNTER — Ambulatory Visit (HOSPITAL_COMMUNITY)
Admission: EM | Admit: 2017-08-30 | Discharge: 2017-08-30 | Disposition: A | Discharge status: Home / Self Care | Payer: Commercial Managed Care - PPO | Attending: Family Medicine | Admitting: Family Medicine

## 2017-08-30 ENCOUNTER — Encounter (HOSPITAL_COMMUNITY): Payer: Self-pay | Admitting: Family Medicine

## 2017-08-30 DIAGNOSIS — M79672 Pain in left foot: Secondary | ICD-10-CM

## 2017-08-30 MED ORDER — MELOXICAM 7.5 MG PO TABS: 7.5000 mg | ORAL_TABLET | Freq: Every day | ORAL | 0 refills | Status: DC

## 2017-08-30 NOTE — ED Provider Notes (Signed)
[place]    CSN: [id number] Arrival date & time: 08/30/17  1657     History   Chief Complaint Chief Complaint  Patient presents with  . Ankle Pain    HPI Kathy Duncan is a 62 y.o. female.   62 year old female comes in for left foot pain.  Patient states that she recently returned to work after right knee surgery that was done 5 months ago.  Work requires long hours of walking and standing.  She has had intermittent left  anterior leg pain that is worse during work, and thought it was due to the recent increase in activity.  States she was talking with her coworker today, and felt her ankle/foot, and noticed some increase in swelling.  Denies injury/trauma.  No obvious pain on palpation, but can elicit some pain with range of motion.  She has been taking Tylenol throughout this past few weeks for the anterior leg pain.  Denies chest pain, shortness of breath, wheezing, palpitations.  Not on any blood thinners.     Past Medical History:  Diagnosis Date  . Arthritis   . Diabetes mellitus    type 2  . Ganglion cyst of wrist 10/14/2013  . Hypertension   . KNEE MENISCUS INJURY, UNSPECIFIED 07/12/2006   Qualifier: History of  By: Carmie End MD, Erin      Patient Active Problem List   Diagnosis Date Noted  . Primary osteoarthritis of right knee 03/01/2017  . Right knee DJD 03/01/2017  . Knee pain, left 02/02/2017  . Ingrown toenail 02/02/2017  . Diabetes mellitus type 2, controlled ([place]) 07/09/2015  . Loss of weight 07/09/2015  . Abnormal vaginal bleeding 07/29/2013  . Diabetic neuropathy ([place]) 09/30/2012  . Atypical ductal hyperplasia of breast 12/15/2010  . Hyperlipidemia 12/28/2006  . CARPAL TUNNEL SYNDROME, BILATERAL 12/28/2006  . OBESITY, NOS 07/12/2006  . Anemia 07/12/2006  . HYPERTENSION, BENIGN SYSTEMIC 07/12/2006    Past Surgical History:  Procedure Laterality Date  . ABDOMINAL HYSTERECTOMY     has ovaries  . BREAST EXCISIONAL BIOPSY  May 2011    . TOTAL KNEE ARTHROPLASTY Right 03/01/2017   Procedure: RIGHT TOTAL KNEE ARTHROPLASTY;  Surgeon: Susa Day, MD;  Location: [facility];  Service: Orthopedics;  Laterality: Right;  120 mins with abductor block    OB History    Gravida  2   Para  2   Term      Preterm      AB      Living  2     SAB      TAB      Ectopic      Multiple      Live Births               Home Medications    Prior to Admission medications   Medication Sig Start Date End Date Taking? Authorizing Provider  aspirin EC 81 MG tablet Take 81 mg by mouth daily.    [provider]  B-D INS SYR ULTRAFINE 1CC/30G 30G X 1/2" 1 ML MISC USE TO INJECT INSULIN DAILY 10/06/16   Kinnie Feil, MD  ferrous sulfate 325 (65 FE) MG tablet Take 1 tablet (325 mg total) by mouth daily with breakfast. 09/12/11   Katherina Mires, MD  insulin glargine (LANTUS) 100 UNIT/ML injection INJECT 35 UNITS INTO THE SKIN DAILY 07/05/17   Leavy Cella, [facility]  Insulin Pen Needle (PEN NEEDLES) 32G X 6 MM MISC 1 each by Does  not apply route daily. 07/05/17   Leavy Cella, RPH  liraglutide (VICTOZA) 18 MG/3ML SOPN Inject 0.6 mg under skin once a day for week 1, then 1.2 mg for week 2, then 1.8 mg thereafter Patient not taking: Reported on 07/16/2017 07/05/17   Leavy Cella, [facility]  losartan-hydrochlorothiazide (HYZAAR) 100-25 MG tablet Take 1 tablet by mouth daily. 07/18/17   Lind Covert, MD  lovastatin (MEVACOR) 40 MG tablet Take 1 tablet (40 mg total) by mouth at bedtime. 07/18/17   Lind Covert, MD  meloxicam (MOBIC) 7.5 MG tablet Take 1 tablet (7.5 mg total) by mouth daily. 08/30/17   Tasia Catchings, Raja Caputi V, PA-C  metFORMIN (GLUCOPHAGE) 1000 MG tablet TAKE 1 TABLET (1,000 MG TOTAL) BY MOUTH 2 (TWO) TIMES DAILY WITH A MEAL. 07/18/17   Chambliss, Jeb Levering, MD    Family History Family History  Problem Relation Age of Onset  . Heart disease Mother   . Stroke Father   . Cancer Brother        Lung  . Heart disease  Sister     Social History Social History   Tobacco Use  . Smoking status: Never Smoker  . Smokeless tobacco: Never Used  Substance Use Topics  . Alcohol use: No  . Drug use: No     Allergies   Ace inhibitors   Review of Systems Review of Systems  Reason unable to perform ROS: See HPI as above.     Physical Exam Triage Vital Signs ED Triage Vitals  Enc Vitals Group     BP 08/30/17 1724 131/80     Pulse Rate 08/30/17 1724 95     Resp 08/30/17 1724 18     Temp 08/30/17 1724 98.3 F (36.8 C)     Temp src --      SpO2 08/30/17 1724 98 %     Weight --      Height --      Head Circumference --      Peak Flow --      Pain Score 08/30/17 1723 3     Pain Loc --      Pain Edu? --      Excl. in [place]? --    No data found.  Updated Vital Signs BP 131/80   Pulse 95   Temp 98.3 F (36.8 C)   Resp 18   SpO2 98%   Physical Exam  Constitutional: She is oriented to person, place, and time. She appears well-developed and well-nourished. No distress.  HENT:  Head: Normocephalic and atraumatic.  Eyes: Pupils are equal, round, and reactive to light. Conjunctivae are normal.  Musculoskeletal:  Mild swelling around the left 2nd MTP.  No erythema, increased warmth, contusions seen.  No pitting edema.  No swelling of the calf.  No tenderness to palpation of ankle and foot.  Full range of motion of ankle and foot.  Strength normal and equal bilaterally.  Sensation intact ankle bilaterally.  Pedal pulse 2+ and equal bilaterally.  Cap refill deferred due to nail polish.  Negative Homans.  Neurological: She is alert and oriented to person, place, and time.     UC Treatments / Results  Labs (all labs ordered are listed, but only abnormal results are displayed) Labs Reviewed - No data to display  EKG None Radiology No results found.  Procedures Procedures (including critical care time)  Medications Ordered in UC Medications - No data to display   Initial Impression /  Assessment  and Plan / UC Course  I have reviewed the triage vital signs and the nursing notes.  Pertinent labs & imaging results that were available during my care of the patient were reviewed by me and considered in my medical decision making (see chart for details).    62 year old female with atraumatic intermittent leg pain with mild foot swelling.  Will treat for information given recent increase in activity.  Start Mobic as directed.  Ice compress, elevation, Ace wrap during activity.  Return precautions given.  Patient expresses understanding and agrees with plan.  Final Clinical Impressions(s) / UC Diagnoses   Final diagnoses:  Left foot pain    ED Discharge Orders        Ordered    meloxicam (MOBIC) 7.5 MG tablet  Daily     08/30/17 1736        Ok Edwards, PA-C 08/30/17 1742

## 2017-08-30 NOTE — Discharge Instructions (Signed)
Symptoms could be due to inflammation, given recent increase in activity. Start mobic as directed. Ice compress, elevation, ace wrap during activity. Follow up with PCP for reevaluation if symptoms not improving.

## 2017-08-30 NOTE — ED Triage Notes (Signed)
Pt here for knot and swelling  To medial  part of the left ankle.

## 2017-10-13 ENCOUNTER — Other Ambulatory Visit: Payer: Self-pay | Admitting: Family Medicine

## 2017-10-14 ENCOUNTER — Other Ambulatory Visit: Payer: Self-pay | Admitting: Family Medicine

## 2017-11-01 ENCOUNTER — Other Ambulatory Visit: Payer: Self-pay | Admitting: Family Medicine

## 2017-11-01 DIAGNOSIS — Z1231 Encounter for screening mammogram for malignant neoplasm of breast: Secondary | ICD-10-CM

## 2017-11-09 ENCOUNTER — Ambulatory Visit: Payer: Commercial Managed Care - PPO | Admitting: Family Medicine

## 2017-11-09 ENCOUNTER — Encounter: Payer: Self-pay | Admitting: Family Medicine

## 2017-11-09 ENCOUNTER — Other Ambulatory Visit: Payer: Self-pay

## 2017-11-09 VITALS — BP 132/70 | HR 91 | Temp 98.2°F | Ht 67.0 in | Wt 201.0 lb

## 2017-11-09 DIAGNOSIS — Z23 Encounter for immunization: Secondary | ICD-10-CM | POA: Diagnosis not present

## 2017-11-09 DIAGNOSIS — E118 Type 2 diabetes mellitus with unspecified complications: Secondary | ICD-10-CM

## 2017-11-09 DIAGNOSIS — E785 Hyperlipidemia, unspecified: Secondary | ICD-10-CM

## 2017-11-09 DIAGNOSIS — E1149 Type 2 diabetes mellitus with other diabetic neurological complication: Secondary | ICD-10-CM

## 2017-11-09 DIAGNOSIS — Z794 Long term (current) use of insulin: Secondary | ICD-10-CM

## 2017-11-09 DIAGNOSIS — I10 Essential (primary) hypertension: Secondary | ICD-10-CM

## 2017-11-09 DIAGNOSIS — M25512 Pain in left shoulder: Secondary | ICD-10-CM | POA: Diagnosis not present

## 2017-11-09 DIAGNOSIS — D508 Other iron deficiency anemias: Secondary | ICD-10-CM | POA: Diagnosis not present

## 2017-11-09 LAB — POCT GLYCOSYLATED HEMOGLOBIN (HGB A1C): HbA1c, POC (controlled diabetic range): 7.7 % — AB (ref 0.0–7.0)

## 2017-11-09 NOTE — Assessment & Plan Note (Signed)
A1C improved. Continue current regimen. Continue home CBG check.

## 2017-11-09 NOTE — Assessment & Plan Note (Signed)
No acute flare. Tight glucose control.

## 2017-11-09 NOTE — Patient Instructions (Signed)
Hypoglycemia Hypoglycemia is when the sugar (glucose) level in the blood is too low. Symptoms of low blood sugar may include:  Feeling: ? Hungry. ? Worried or nervous (anxious). ? Sweaty and clammy. ? Confused. ? Dizzy. ? Sleepy. ? Sick to your stomach (nauseous).  Having: ? A fast heartbeat. ? A headache. ? A change in your vision. ? Jerky movements that you cannot control (seizure). ? Nightmares. ? Tingling or no feeling (numbness) around the mouth, lips, or tongue.  Having trouble with: ? Talking. ? Paying attention (concentrating). ? Moving (coordination). ? Sleeping.  Shaking.  Passing out (fainting).  Getting upset easily (irritability).  Low blood sugar can happen to people who have diabetes and people who do not have diabetes. Low blood sugar can happen quickly, and it can be an emergency. Treating Low Blood Sugar Low blood sugar is often treated by eating or drinking something sugary right away. If you can think clearly and swallow safely, follow the 15:15 rule:  Take 15 grams of a fast-acting carb (carbohydrate). Some fast-acting carbs are: ? 1 tube of glucose gel. ? 3 sugar tablets (glucose pills). ? 6-8 pieces of hard candy. ? 4 oz (120 mL) of fruit juice. ? 4 oz (120 mL) of regular (not diet) soda.  Check your blood sugar 15 minutes after you take the carb.  If your blood sugar is still at or below 70 mg/dL (3.9 mmol/L), take 15 grams of a carb again.  If your blood sugar does not go above 70 mg/dL (3.9 mmol/L) after 3 tries, get help right away.  After your blood sugar goes back to normal, eat a meal or a snack within 1 hour.  Treating Very Low Blood Sugar If your blood sugar is at or below 54 mg/dL (3 mmol/L), you have very low blood sugar (severe hypoglycemia). This is an emergency. Do not wait to see if the symptoms will go away. Get medical help right away. Call your local emergency services (911 in the [place] Do not drive yourself to the  hospital. If you have very low blood sugar and you cannot eat or drink, you may need a glucagon shot (injection). A family member or friend should learn how to check your blood sugar and how to give you a glucagon shot. Ask your doctor if you need to have a glucagon shot kit at home. Follow these instructions at home: General instructions  Avoid any diets that cause you to not eat enough food. Talk with your doctor before you start any new diet.  Take over-the-counter and prescription medicines only as told by your doctor.  Limit alcohol to no more than 1 drink per day for nonpregnant women and 2 drinks per day for men. One drink equals 12 oz of beer, 5 oz of wine, or 1 oz of hard liquor.  Keep all follow-up visits as told by your doctor. This is important. If You Have Diabetes:   Make sure you know the symptoms of low blood sugar.  Always keep a source of sugar with you, such as: ? Sugar. ? Sugar tablets. ? Glucose gel. ? Fruit juice. ? Regular soda (not diet soda). ? Milk. ? Hard candy. ? Honey.  Take your medicines as told.  Follow your exercise and meal plan. ? Eat on time. Do not skip meals. ? Follow your sick day plan when you cannot eat or drink normally. Make this plan ahead of time with your doctor.  Check your blood sugar as often   as told by your doctor. Always check before and after exercise.  Share your diabetes care plan with: ? Your work or school. ? People you live with.  Check your pee (urine) for ketones: ? When you are sick. ? As told by your doctor.  Carry a card or wear jewelry that says you have diabetes. If You Have Low Blood Sugar From Other Causes:   Check your blood sugar as often as told by your doctor.  Follow instructions from your doctor about what you cannot eat or drink. Contact a doctor if:  You have trouble keeping your blood sugar in your target range.  You have low blood sugar often. Get help right away if:  You still have  symptoms after you eat or drink something sugary.  Your blood sugar is at or below 54 mg/dL (3 mmol/L).  You have jerky movements that you cannot control.  You pass out. These symptoms may be an emergency. Do not wait to see if the symptoms will go away. Get medical help right away. Call your local emergency services (911 in the [place] Do not drive yourself to the hospital. This information is not intended to replace advice given to you by your health care provider. Make sure you discuss any questions you have with your health care provider. Document Released: 07/26/2009 Document Revised: 10/07/2015 Document Reviewed: 06/04/2015 Elsevier Interactive Patient Education  [employer].

## 2017-11-09 NOTE — Assessment & Plan Note (Signed)
CBC checked. Continue iron supplement.

## 2017-11-09 NOTE — Assessment & Plan Note (Addendum)
BP looks good. Check Bmet

## 2017-11-09 NOTE — Assessment & Plan Note (Addendum)
Over use vs OA worsening. Doing well on Tylenol and Ibuprofen as needed. Continue same regimen. Home exercise instruction discussed. PT referral and xray if no improvement. She agreed with plan.  Note: LL exam benign.

## 2017-11-09 NOTE — Assessment & Plan Note (Signed)
Check FLP 

## 2017-11-09 NOTE — Progress Notes (Signed)
  Subjective:     Patient ID: Kathy Duncan, female   DOB: 08-25-1955, 62 y.o.   MRN: [id number]  HPI DM2/HTN/HLD: Compliant with meds. BP and CBG has been running fine. She is here for f/u. Anemia:Compliant with ferrous sulp. Here for follow up. Shoulder: C/O Left shoulder pain and left LL pain, worse at night x 1 yr. 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 Uses OTC pain meds as needed. Feel better today.   Current Outpatient Medications on File Prior to Visit  Medication Sig Dispense Refill  . aspirin EC 81 MG tablet Take 81 mg by mouth daily.    . ferrous sulfate 325 (65 FE) MG tablet Take 1 tablet (325 mg total) by mouth daily with breakfast. 30 tablet 5  . insulin glargine (LANTUS) 100 UNIT/ML injection Inject 0.35 mLs (35 Units total) into the skin daily. 40 mL 3  . losartan-hydrochlorothiazide (HYZAAR) 100-25 MG tablet Take 1 tablet by mouth daily. 90 tablet 0  . lovastatin (MEVACOR) 40 MG tablet Take 1 tablet (40 mg total) by mouth at bedtime. 90 tablet 0  . metFORMIN (GLUCOPHAGE) 1000 MG tablet TAKE 1 TABLET (1,000 MG TOTAL) BY MOUTH 2 (TWO) TIMES DAILY WITH A MEAL. 180 tablet 2  . B-D INS SYR ULTRAFINE 1CC/30G 30G X 1/2" 1 ML MISC USE TO INJECT INSULIN DAILY 100 each 2  . Insulin Pen Needle (PEN NEEDLES) 32G X 6 MM MISC 1 each by Does not apply route daily. 100 each 3  . liraglutide (VICTOZA) 18 MG/3ML SOPN Inject 0.6 mg under skin once a day for week 1, then 1.2 mg for week 2, then 1.8 mg thereafter (Patient not taking: Reported on 07/16/2017) 3 pen 2  . meloxicam (MOBIC) 7.5 MG tablet Take 1 tablet (7.5 mg total) by mouth daily. (Patient not taking: Reported on 11/09/2017) 15 tablet 0   No current facility-administered medications on file prior to visit.    Past Medical History:  Diagnosis Date  . Arthritis   . Diabetes mellitus    type 2  . Ganglion cyst of wrist 10/14/2013  . Hypertension   . KNEE MENISCUS INJURY, UNSPECIFIED 07/12/2006   Qualifier: History of  By: Carmie End MD, Erin     Vitals:   11/09/17 0846  BP: 132/70  Pulse: 91  Temp: 98.2 F (36.8 C)  TempSrc: Oral  SpO2: 98%  Weight: 201 lb (91.2 kg)  Height: 5\' 7"  (1.702 m)     Review of Systems  Respiratory: Negative.   Cardiovascular: Negative.   Gastrointestinal: Negative.   Musculoskeletal: Positive for arthralgias.  Neurological: Negative.   All other systems reviewed and are negative.      Objective:   Physical Exam  Constitutional: She appears well-developed. No distress.  Cardiovascular: Normal rate, regular rhythm and normal heart sounds.  No murmur heard. Pulmonary/Chest: Effort normal and breath sounds normal. No stridor. No respiratory distress.  Abdominal: Soft. Bowel sounds are normal. She exhibits no distension. No hernia.  Musculoskeletal: Normal range of motion. She exhibits no deformity.  Sensory exam of the foot is normal, tested with the monofilament. Good pulses, no lesions or ulcers, good peripheral pulses. Thickened toenails  Nursing note and vitals reviewed.      Assessment:     DM2 HTN HLD Anemia Left shoulder and LL pain    Plan:     Check problem list. Pneumonia shot given.

## 2017-11-10 LAB — LIPID PANEL
Chol/HDL Ratio: 2.7 ratio (ref 0.0–4.4)
Cholesterol, Total: 155 mg/dL (ref 100–199)
HDL: 57 mg/dL (ref >39)
LDL Calculated: 80 mg/dL (ref 0–99)
Triglycerides: 91 mg/dL (ref 0–149)
VLDL Cholesterol Cal: 18 mg/dL (ref 5–40)

## 2017-11-10 LAB — CBC WITH DIFFERENTIAL/PLATELET
Basophils Absolute: 0 10*3/uL (ref 0.0–0.2)
Basos: 0 %
EOS (ABSOLUTE): 1 10*3/uL — ABNORMAL HIGH (ref 0.0–0.4)
Eos: 20 %
Hematocrit: 35.6 % (ref 34.0–46.6)
Hemoglobin: 11.4 g/dL (ref 11.1–15.9)
Immature Grans (Abs): 0 10*3/uL (ref 0.0–0.1)
Immature Granulocytes: 0 %
Lymphocytes Absolute: 1.7 10*3/uL (ref 0.7–3.1)
Lymphs: 31 %
MCH: 25 pg — ABNORMAL LOW (ref 26.6–33.0)
MCHC: 32 g/dL (ref 31.5–35.7)
MCV: 78 fL — ABNORMAL LOW (ref 79–97)
Monocytes Absolute: 0.3 10*3/uL (ref 0.1–0.9)
Monocytes: 6 %
Neutrophils Absolute: 2.2 10*3/uL (ref 1.4–7.0)
Neutrophils: 43 %
Platelets: 337 10*3/uL (ref 150–450)
RBC: 4.56 x10E6/uL (ref 3.77–5.28)
RDW: 15.6 % — ABNORMAL HIGH (ref 12.3–15.4)
WBC: 5.3 10*3/uL (ref 3.4–10.8)

## 2017-11-10 LAB — BASIC METABOLIC PANEL
BUN/Creatinine Ratio: 12 (ref 12–28)
BUN: 9 mg/dL (ref 8–27)
CO2: 28 mmol/L (ref 20–29)
Calcium: 10 mg/dL (ref 8.7–10.3)
Chloride: 99 mmol/L (ref 96–106)
Creatinine, Ser: 0.73 mg/dL (ref 0.57–1.00)
GFR calc Af Amer: 103 mL/min/{1.73_m2} (ref >59)
GFR calc non Af Amer: 89 mL/min/{1.73_m2} (ref >59)
Glucose: 85 mg/dL (ref 65–99)
Potassium: 4.7 mmol/L (ref 3.5–5.2)
Sodium: 140 mmol/L (ref 134–144)

## 2017-11-12 ENCOUNTER — Encounter: Payer: Self-pay | Admitting: Family Medicine

## 2017-11-12 NOTE — Progress Notes (Signed)
A1C result discussed with patient during last visit. Otherwise normal result letter sent for the rest of her result.

## 2017-11-16 ENCOUNTER — Telehealth: Payer: Self-pay

## 2017-11-16 ENCOUNTER — Telehealth: Payer: Self-pay | Admitting: Family Medicine

## 2017-11-16 NOTE — Telephone Encounter (Signed)
I called back again with no response. HIPAA compliant call back message left. When she calls, please set up follow-up appointment for her, or encourage her to go the the urgent care since it is the weekend. I am unable to medicate or treat over the telephone.

## 2017-11-16 NOTE — Telephone Encounter (Signed)
HIPAA compliant call back message left on mobile phone and the [phone] number.

## 2017-11-16 NOTE — Telephone Encounter (Signed)
Pt called back returning Dr Macario Golds phone call. She said she didn't have her phone but will be able to answer now.

## 2017-11-16 NOTE — Telephone Encounter (Signed)
Pt calling because she has a boil on her thigh. No appointments available for today. Pt would like to talk to Dr. Gwendlyn Deutscher to see what can be done to help her. Please call [phone]. You can leave a message if you need to. Ottis Stain, CMA

## 2017-11-29 ENCOUNTER — Other Ambulatory Visit: Payer: Self-pay | Admitting: *Deleted

## 2017-11-29 ENCOUNTER — Ambulatory Visit
Admission: RE | Admit: 2017-11-29 | Discharge: 2017-11-29 | Disposition: A | Discharge status: Home / Self Care | Payer: Commercial Managed Care - PPO | Source: Ambulatory Visit | Attending: Family Medicine | Admitting: Family Medicine

## 2017-11-29 DIAGNOSIS — Z1231 Encounter for screening mammogram for malignant neoplasm of breast: Secondary | ICD-10-CM | POA: Diagnosis not present

## 2017-11-29 MED ORDER — LOSARTAN POTASSIUM-HCTZ 100-25 MG PO TABS: 1.0000 | ORAL_TABLET | Freq: Every day | ORAL | 3 refills | Status: DC

## 2017-11-29 MED ORDER — METFORMIN HCL 1000 MG PO TABS: ORAL_TABLET | ORAL | 3 refills | Status: DC

## 2017-11-29 MED ORDER — LOVASTATIN 40 MG PO TABS: 40.0000 mg | ORAL_TABLET | Freq: Every day | ORAL | 3 refills | Status: DC

## 2017-11-29 NOTE — Telephone Encounter (Signed)
Switching to mail order.  Needs these called in a 90 day supply. Fleeger, Salome Spotted, CMA

## 2017-11-29 NOTE — Telephone Encounter (Signed)
Not taking Victoza

## 2017-12-11 ENCOUNTER — Other Ambulatory Visit: Payer: Self-pay | Admitting: Family Medicine

## 2017-12-11 NOTE — Telephone Encounter (Signed)
Refill was done to [facility] this month for a 90 days supply and with three refills.

## 2018-01-17 DIAGNOSIS — M25561 Pain in right knee: Secondary | ICD-10-CM | POA: Diagnosis not present

## 2018-01-24 DIAGNOSIS — H43822 Vitreomacular adhesion, left eye: Secondary | ICD-10-CM | POA: Diagnosis not present

## 2018-01-24 DIAGNOSIS — H35043 Retinal micro-aneurysms, unspecified, bilateral: Secondary | ICD-10-CM | POA: Diagnosis not present

## 2018-01-24 DIAGNOSIS — E103292 Type 1 diabetes mellitus with mild nonproliferative diabetic retinopathy without macular edema, left eye: Secondary | ICD-10-CM | POA: Diagnosis not present

## 2018-01-24 DIAGNOSIS — H3562 Retinal hemorrhage, left eye: Secondary | ICD-10-CM | POA: Diagnosis not present

## 2018-01-24 LAB — HM DIABETES EYE EXAM

## 2018-02-01 ENCOUNTER — Encounter: Payer: Self-pay | Admitting: Family Medicine

## 2018-03-08 DIAGNOSIS — M25512 Pain in left shoulder: Secondary | ICD-10-CM | POA: Diagnosis not present

## 2018-03-08 DIAGNOSIS — M7542 Impingement syndrome of left shoulder: Secondary | ICD-10-CM | POA: Diagnosis not present

## 2018-04-05 ENCOUNTER — Ambulatory Visit: Payer: Commercial Managed Care - PPO | Admitting: Family Medicine

## 2018-04-05 ENCOUNTER — Other Ambulatory Visit: Payer: Self-pay

## 2018-04-05 ENCOUNTER — Encounter: Payer: Self-pay | Admitting: Family Medicine

## 2018-04-05 VITALS — BP 132/82 | HR 88 | Temp 98.0°F | Ht 67.0 in | Wt 193.0 lb

## 2018-04-05 DIAGNOSIS — Z794 Long term (current) use of insulin: Secondary | ICD-10-CM

## 2018-04-05 DIAGNOSIS — Z23 Encounter for immunization: Secondary | ICD-10-CM

## 2018-04-05 DIAGNOSIS — E118 Type 2 diabetes mellitus with unspecified complications: Secondary | ICD-10-CM

## 2018-04-05 DIAGNOSIS — R252 Cramp and spasm: Secondary | ICD-10-CM | POA: Diagnosis not present

## 2018-04-05 DIAGNOSIS — R202 Paresthesia of skin: Secondary | ICD-10-CM | POA: Diagnosis not present

## 2018-04-05 DIAGNOSIS — E559 Vitamin D deficiency, unspecified: Secondary | ICD-10-CM | POA: Diagnosis not present

## 2018-04-05 DIAGNOSIS — E1149 Type 2 diabetes mellitus with other diabetic neurological complication: Secondary | ICD-10-CM

## 2018-04-05 LAB — POCT GLYCOSYLATED HEMOGLOBIN (HGB A1C): HbA1c, POC (controlled diabetic range): 6.7 % (ref 0.0–7.0)

## 2018-04-05 NOTE — Patient Instructions (Signed)
It was nice seeing you today. I am so impressed with your A1C result. You have done well on your diet control. Please keep it up and continue same regimen including your medications. I will like to see you back in 4 months. We will get some lab work done today for cramping and I will call with the result.

## 2018-04-05 NOTE — Progress Notes (Signed)
Subjective:     Patient ID: Kathy Duncan, female   DOB: 1956/01/04, 62 y.o.   MRN: [id number]  HPI DM2: Home CBG has been good. Highest was 200+ when she ate bad 1 month ago, this morning her CBG was 66. She has been eating healthy. Cramping: On and off daily mostly at night, this has been ongoing for about a month. She will get up and walk around and then it goes away. At times her cramping can be so bad and intense. She gets well hydrated by drinking a lot of water. Started since the weather changed. Tingling: On and off tingling in her hand. But currently asymptomatic. [id number] for routine health care. Current Outpatient Medications on File Prior to Visit  Medication Sig Dispense Refill  . aspirin EC 81 MG tablet Take 81 mg by mouth daily.    . B-D INS SYR ULTRAFINE 1CC/30G 30G X 1/2" 1 ML MISC USE TO INJECT INSULIN DAILY 100 each 2  . ferrous sulfate 325 (65 FE) MG tablet Take 1 tablet (325 mg total) by mouth daily with breakfast. 30 tablet 5  . insulin glargine (LANTUS) 100 UNIT/ML injection Inject 0.35 mLs (35 Units total) into the skin daily. 40 mL 3  . Insulin Pen Needle (PEN NEEDLES) 32G X 6 MM MISC 1 each by Does not apply route daily. 100 each 3  . liraglutide (VICTOZA) 18 MG/3ML SOPN Inject 0.6 mg under skin once a day for week 1, then 1.2 mg for week 2, then 1.8 mg thereafter (Patient not taking: Reported on 07/16/2017) 3 pen 2  . losartan-hydrochlorothiazide (HYZAAR) 100-25 MG tablet Take 1 tablet by mouth daily. 90 tablet 3  . lovastatin (MEVACOR) 40 MG tablet Take 1 tablet (40 mg total) by mouth at bedtime. 90 tablet 3  . meloxicam (MOBIC) 7.5 MG tablet Take 1 tablet (7.5 mg total) by mouth daily. (Patient not taking: Reported on 11/09/2017) 15 tablet 0  . metFORMIN (GLUCOPHAGE) 1000 MG tablet TAKE 1 TABLET (1,000 MG TOTAL) BY MOUTH 2 (TWO) TIMES DAILY WITH A MEAL. 180 tablet 3   No current facility-administered medications on file prior to visit.    Past Medical History:   Diagnosis Date  . Arthritis   . Diabetes mellitus    type 2  . Ganglion cyst of wrist 10/14/2013  . Hypertension   . KNEE MENISCUS INJURY, UNSPECIFIED 07/12/2006   Qualifier: History of  By: Carmie End MD, Erin     Vitals:   04/05/18 0835  BP: 132/82  Pulse: 88  Temp: 98 F (36.7 C)  TempSrc: Oral  SpO2: 98%  Weight: 193 lb (87.5 kg)  Height: 5\' 7"  (1.702 m)     Review of Systems  Respiratory: Negative.   Cardiovascular: Negative.   Gastrointestinal: Negative.   Neurological: Negative.   All other systems reviewed and are negative.      Objective:   Physical Exam  Constitutional: She is oriented to person, place, and time. She appears well-developed. No distress.  Cardiovascular: Normal rate, regular rhythm and normal heart sounds.  No murmur heard. Pulmonary/Chest: Effort normal and breath sounds normal. No stridor. No respiratory distress. She has no wheezes.  Abdominal: Soft. Bowel sounds are normal. She exhibits no distension. There is no tenderness.  Musculoskeletal: She exhibits no edema.  Neurological: She is alert and oriented to person, place, and time.  Nursing note and vitals reviewed.      Assessment:     DM2 Muscle cramping Paresthesia Health maintenance  Plan:     Check problem list.  Flu shot was given today. She signed release of info consent for colonoscopy result.

## 2018-04-05 NOTE — Assessment & Plan Note (Signed)
A1C improved on diet and medication. She self d/c Victoza. Continue Lantus 40 units qd. Continue Metformin 1000 mg BID. Continue home CBG monitoring.

## 2018-04-05 NOTE — Assessment & Plan Note (Signed)
Differential include medication/Statin induced, change in weather (Cold), or electrolyte imbalance vs dehydration. She keeps well hydrated. Check Bmet, magnesium. Check Muscle enzymes. Continue home massaging. Call if worsening.

## 2018-04-05 NOTE — Assessment & Plan Note (Signed)
Intermittent. 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 Check Vit B12. Will contact with result. Vitamin D checked for previously low levels.

## 2018-04-06 ENCOUNTER — Telehealth: Payer: Self-pay | Admitting: Family Medicine

## 2018-04-06 LAB — VITAMIN D 25 HYDROXY (VIT D DEFICIENCY, FRACTURES): Vit D, 25-Hydroxy: 41.5 ng/mL (ref 30.0–100.0)

## 2018-04-06 LAB — BASIC METABOLIC PANEL
BUN/Creatinine Ratio: 26 (ref 12–28)
BUN: 21 mg/dL (ref 8–27)
CO2: 25 mmol/L (ref 20–29)
Calcium: 9.9 mg/dL (ref 8.7–10.3)
Chloride: 99 mmol/L (ref 96–106)
Creatinine, Ser: 0.81 mg/dL (ref 0.57–1.00)
GFR calc Af Amer: 90 mL/min/{1.73_m2} (ref >59)
GFR calc non Af Amer: 78 mL/min/{1.73_m2} (ref >59)
Glucose: 49 mg/dL — ABNORMAL LOW (ref 65–99)
Potassium: 4.4 mmol/L (ref 3.5–5.2)
Sodium: 139 mmol/L (ref 134–144)

## 2018-04-06 LAB — VITAMIN B12: Vitamin B-12: 264 pg/mL (ref 232–1245)

## 2018-04-06 LAB — MYOGLOBIN, SERUM: Myoglobin: 29 ng/mL (ref 25–58)

## 2018-04-06 LAB — MAGNESIUM: Magnesium: 1.7 mg/dL (ref 1.6–2.3)

## 2018-04-06 LAB — CK: Total CK: 165 U/L (ref 24–173)

## 2018-04-06 NOTE — Telephone Encounter (Signed)
Test result discussed with her. Glucose 47. She stated that she came fasting the morning of her appointment because she was going to check her A1C. When she got home after eating her glucose was 101. Denies hypoglycemic symptoms after eating yesterday. She normally takes her Insulin at night. I advised her not come to the clinic fasting for A1C check as this is not necessary. She will check CBG at home as usually and if persistently low, she will call. Continue current insulin regimen.

## 2018-07-12 DIAGNOSIS — M25512 Pain in left shoulder: Secondary | ICD-10-CM | POA: Diagnosis not present

## 2018-07-12 DIAGNOSIS — M7542 Impingement syndrome of left shoulder: Secondary | ICD-10-CM | POA: Diagnosis not present

## 2018-07-25 ENCOUNTER — Other Ambulatory Visit: Payer: Self-pay

## 2018-07-25 ENCOUNTER — Ambulatory Visit (HOSPITAL_COMMUNITY)
Admission: RE | Admit: 2018-07-25 | Discharge: 2018-07-25 | Disposition: A | Discharge status: Home / Self Care | Payer: Commercial Managed Care - PPO | Source: Ambulatory Visit | Attending: Family Medicine | Admitting: Family Medicine

## 2018-07-25 ENCOUNTER — Ambulatory Visit: Payer: Commercial Managed Care - PPO | Admitting: Family Medicine

## 2018-07-25 ENCOUNTER — Encounter: Payer: Self-pay | Admitting: Family Medicine

## 2018-07-25 VITALS — BP 120/62 | HR 109 | Temp 98.9°F | Wt 188.0 lb

## 2018-07-25 DIAGNOSIS — E118 Type 2 diabetes mellitus with unspecified complications: Secondary | ICD-10-CM

## 2018-07-25 DIAGNOSIS — R002 Palpitations: Secondary | ICD-10-CM | POA: Diagnosis not present

## 2018-07-25 DIAGNOSIS — Z794 Long term (current) use of insulin: Secondary | ICD-10-CM

## 2018-07-25 DIAGNOSIS — R000 Tachycardia, unspecified: Secondary | ICD-10-CM

## 2018-07-25 LAB — POCT GLYCOSYLATED HEMOGLOBIN (HGB A1C): HbA1c, POC (controlled diabetic range): 7.1 % — AB (ref 0.0–7.0)

## 2018-07-25 LAB — GLUCOSE, POCT (MANUAL RESULT ENTRY): POC Glucose: 131 mg/dl — AB (ref 70–99)

## 2018-07-25 NOTE — Progress Notes (Signed)
Subjective:     Patient ID: Kathy Duncan, female   DOB: May 27, 1955, 63 y.o.   MRN: [id number]  Diabetes  She presents for her follow-up diabetic visit. She has type 2 diabetes mellitus. No MedicAlert identification noted. Her disease course has been stable. There are no hypoglycemic associated symptoms. Pertinent negatives for hypoglycemia include no dizziness or nervousness/anxiousness. Pertinent negatives for diabetes include no chest pain, no polydipsia and no polyphagia. There are no hypoglycemic complications. Current diabetic treatments: Her glucose was running low, so she reduced her Lantus from 40 units to 35 units daily for more 4 weeks now. Metformin 1000 mg BID. She is compliant with treatment all of the time. Home blood sugar record trend: Glucose has been in the 100s to less than 200s since then.  Palpitations   This is a new problem. The current episode started more than 1 month ago. The problem occurs intermittently. The problem has been gradually worsening. Nothing aggravates the symptoms. Pertinent negatives include no anxiety, chest fullness, chest pain, dizziness, irregular heartbeat, malaise/fatigue, nausea, near-syncope, shortness of breath or syncope. She has tried nothing for the symptoms. Risk factors include diabetes mellitus.     Current Outpatient Medications on File Prior to Visit  Medication Sig Dispense Refill  . aspirin EC 81 MG tablet Take 81 mg by mouth daily.    . B-D INS SYR ULTRAFINE 1CC/30G 30G X 1/2" 1 ML MISC USE TO INJECT INSULIN DAILY 100 each 2  . ferrous sulfate 325 (65 FE) MG tablet Take 1 tablet (325 mg total) by mouth daily with breakfast. (Patient taking differently: Take 325 mg by mouth 2 (two) times daily with a meal. ) 30 tablet 5  . insulin glargine (LANTUS) 100 UNIT/ML injection Inject 0.35 mLs (35 Units total) into the skin daily. 40 mL 3  . Insulin Pen Needle (PEN NEEDLES) 32G X 6 MM MISC 1 each by Does not apply route daily. 100 each 3   . losartan-hydrochlorothiazide (HYZAAR) 100-25 MG tablet Take 1 tablet by mouth daily. 90 tablet 3  . lovastatin (MEVACOR) 40 MG tablet Take 1 tablet (40 mg total) by mouth at bedtime. 90 tablet 3  . meloxicam (MOBIC) 7.5 MG tablet Take 1 tablet (7.5 mg total) by mouth daily. (Patient not taking: Reported on 11/09/2017) 15 tablet 0  . metFORMIN (GLUCOPHAGE) 1000 MG tablet TAKE 1 TABLET (1,000 MG TOTAL) BY MOUTH 2 (TWO) TIMES DAILY WITH A MEAL. 180 tablet 3   No current facility-administered medications on file prior to visit.    Past Medical History:  Diagnosis Date  . Arthritis   . Diabetes mellitus    type 2  . Ganglion cyst of wrist 10/14/2013  . Hypertension   . KNEE MENISCUS INJURY, UNSPECIFIED 07/12/2006   Qualifier: History of  By: Carmie End MD, Erin     Vitals:   07/25/18 1548  BP: 120/62  Pulse: (!) 110  Temp: 98.9 F (37.2 C)  TempSrc: Oral  SpO2: 96%  Weight: 188 lb (85.3 kg)     Review of Systems  Constitutional: Negative for malaise/fatigue.  Respiratory: Negative for shortness of breath.   Cardiovascular: Positive for palpitations. Negative for chest pain, syncope and near-syncope.  Gastrointestinal: Negative for nausea.  Endocrine: Negative for polydipsia and polyphagia.  Neurological: Negative for dizziness.  Psychiatric/Behavioral: The patient is not nervous/anxious.   All other systems reviewed and are negative.      Objective:   Physical Exam Vitals signs and nursing note reviewed.  Constitutional:  Appearance: She is not ill-appearing.  Cardiovascular:     Rate and Rhythm: Normal rate and regular rhythm.     Heart sounds: Normal heart sounds. No murmur.  Pulmonary:     Effort: Pulmonary effort is normal. No respiratory distress.     Breath sounds: Normal breath sounds. No wheezing or rhonchi.  Musculoskeletal: Normal range of motion.     Right lower leg: No edema.     Left lower leg: No edema.  Neurological:     General: No focal deficit  present.     Mental Status: She is alert.        Assessment:     DM2 Palpitation & Tachycardia    Plan:     Check problem list.

## 2018-07-25 NOTE — Assessment & Plan Note (Signed)
A1C = 7.1 This is about goal for her. Continue current regimen. F/U in 3 months.

## 2018-07-25 NOTE — Patient Instructions (Signed)
It was nice seeing you today. Your EKG shows rapid heart rate without abnormal rhythm. I will like to check you for thyroid disease and also get heart monitor on. Please continue Lantus 35 units and your Metformin. I will see you in 3 months.  Please go to the ED if symptoms worsen. Thanks.

## 2018-07-25 NOTE — Assessment & Plan Note (Addendum)
Denies chest pain. She is hemodynamically stable. EKG done during this visit shows sinus tachycardia @ 105 bpm with non-specific ST changes. Changes from 2018 EKG include increased HR. HR not high enough to warrant beta-blocker at this point. CHeck TSH. Holter monitor ordered. Consider Cards referral. ED precaution discussed. She understand she need to visit the ED if symptoms worsen or she develops chest pain.

## 2018-07-26 ENCOUNTER — Encounter: Payer: Self-pay | Admitting: *Deleted

## 2018-07-26 LAB — TSH: TSH: 0.564 u[IU]/mL (ref 0.450–4.500)

## 2018-07-26 NOTE — Progress Notes (Signed)
Letter created and routed to Kathy Duncan. [name], CMA

## 2018-09-11 ENCOUNTER — Telehealth: Payer: Self-pay

## 2018-09-11 NOTE — Telephone Encounter (Signed)
LVM for patient to call office and schedule an appointment per Dr. Gwendlyn Deutscher.  Kathy Duncan, [place]

## 2018-09-11 NOTE — Telephone Encounter (Signed)
Called patient back and told her to call to schedule an appointment with Dr. Gwendlyn Deutscher.  Kathy Duncan, [name]

## 2018-09-27 ENCOUNTER — Ambulatory Visit: Payer: Commercial Managed Care - PPO | Admitting: Family Medicine

## 2018-10-07 ENCOUNTER — Other Ambulatory Visit: Payer: Self-pay | Admitting: Family Medicine

## 2018-10-21 ENCOUNTER — Other Ambulatory Visit: Payer: Self-pay | Admitting: Family Medicine

## 2018-10-21 DIAGNOSIS — Z1231 Encounter for screening mammogram for malignant neoplasm of breast: Secondary | ICD-10-CM

## 2018-11-25 ENCOUNTER — Telehealth: Payer: Self-pay

## 2018-11-25 NOTE — Telephone Encounter (Signed)
The pt was calling about the letter she received about having an holter monitor sent to her. Please give her a call back at [phone]. The pt wants to know will it be best for her to see her Doctor first to see if she still needs the holter monitor. She has an appointment with the dr on 11-29-2018. She wants to know if she should have her Doctor contact you guys. The pt Doctor name is En[place].

## 2018-11-29 ENCOUNTER — Other Ambulatory Visit: Payer: Self-pay

## 2018-11-29 ENCOUNTER — Ambulatory Visit: Payer: Commercial Managed Care - PPO | Admitting: Family Medicine

## 2018-11-29 ENCOUNTER — Encounter: Payer: Self-pay | Admitting: Family Medicine

## 2018-11-29 VITALS — BP 124/68 | HR 87

## 2018-11-29 DIAGNOSIS — I10 Essential (primary) hypertension: Secondary | ICD-10-CM | POA: Diagnosis not present

## 2018-11-29 DIAGNOSIS — Z794 Long term (current) use of insulin: Secondary | ICD-10-CM | POA: Diagnosis not present

## 2018-11-29 DIAGNOSIS — E118 Type 2 diabetes mellitus with unspecified complications: Secondary | ICD-10-CM

## 2018-11-29 DIAGNOSIS — R002 Palpitations: Secondary | ICD-10-CM | POA: Diagnosis not present

## 2018-11-29 LAB — POCT GLYCOSYLATED HEMOGLOBIN (HGB A1C): HbA1c, POC (controlled diabetic range): 7.1 % — AB (ref 0.0–7.0)

## 2018-11-29 MED ORDER — DICLOFENAC SODIUM 1 % TD GEL: 4.0000 g | Freq: Four times a day (QID) | TRANSDERMAL | 3 refills | Status: DC

## 2018-11-29 MED ORDER — ONETOUCH ULTRASOFT LANCETS MISC: 3 refills | Status: DC

## 2018-11-29 MED ORDER — "BD INSULIN SYRINGE U/F 30G X 1/2"" 1 ML MISC": 3 refills | Status: DC

## 2018-11-29 MED ORDER — GLUCOSE BLOOD VI STRP: ORAL_STRIP | 3 refills | Status: DC

## 2018-11-29 NOTE — Assessment & Plan Note (Signed)
BP looks good. Continue current regimen. 

## 2018-11-29 NOTE — Assessment & Plan Note (Signed)
Resolved. Hold off on Holter monitoring. ED precaution discussed. F/U as needed.

## 2018-11-29 NOTE — Patient Instructions (Signed)
Palpitations Palpitations are feelings that your heartbeat is not normal. Your heartbeat may feel like it is:  Uneven.  Faster than normal.  Fluttering.  Skipping a beat. This is usually not a serious problem. In some cases, you may need tests to rule out any serious problems. Follow these instructions at home: Pay attention to any changes in your condition. Take these actions to help manage your symptoms: Eating and drinking  Avoid: ? Coffee, tea, soft drinks, and energy drinks. ? Chocolate. ? Alcohol. ? Diet pills. Lifestyle   Try to lower your stress. These things can help you relax: ? Yoga. ? Deep breathing and meditation. ? Exercise. ? Using words and images to create positive thoughts (guided imagery). ? Using your mind to control things in your body (biofeedback).  Do not use drugs.  Get plenty of rest and sleep. Keep a regular bed time. General instructions   Take over-the-counter and prescription medicines only as told by your doctor.  Do not use any products that contain nicotine or tobacco, such as cigarettes and e-cigarettes. If you need help quitting, ask your doctor.  Keep all follow-up visits as told by your doctor. This is important. You may need more tests if palpitations do not go away or get worse. Contact a doctor if:  Your symptoms last more than 24 hours.  Your symptoms occur more often. Get help right away if you:  Have chest pain.  Feel short of breath.  Have a very bad headache.  Feel dizzy.  Pass out (faint). Summary  Palpitations are feelings that your heartbeat is uneven or faster than normal. It may feel like your heart is fluttering or skipping a beat.  Avoid food and drinks that may cause palpitations. These include caffeine, chocolate, and alcohol.  Try to lower your stress. Do not smoke or use drugs.  Get help right away if you faint or have chest pain, shortness of breath, a severe headache, or dizziness. This  information is not intended to replace advice given to you by your health care provider. Make sure you discuss any questions you have with your health care provider. Document Released: 02/08/2008 Document Revised: 06/13/2017 Document Reviewed: 06/13/2017 Elsevier Patient Education  2020 [employer]vier Inc[employer]  

## 2018-11-29 NOTE — Assessment & Plan Note (Signed)
A1C of 7.1 today similar to the previous report. No change in med regimen. Improve on diet and exercise. F/U in 3 -4 months.

## 2018-11-29 NOTE — Progress Notes (Signed)
Subjective:     Patient ID: Kathy Duncan, female   DOB: April 06, 1956, 63 y.o.   MRN: [id number]  HPI Palpitation: Denies any symptoms since her last visit. No SOB, no chest pain, no dizziness. Feels well in general. Does not want to get Holter done but wanted to check with me first. HTN/DM2: She is compliant with meds. Here for F/U. Home BP has been good and her home CBG are :72,142,67,113,103,73 in the last few weeks. No hypoglycemic episode. Knee pain: C/O Left knee pain for about 2 years or more. She is s/p right knee replacement in 2018. She has been using OTC Tylenol with minimal improvement. She has ortho f/u in Oct.   Current Outpatient Medications on File Prior to Visit  Medication Sig Dispense Refill  . aspirin EC 81 MG tablet Take 81 mg by mouth daily.    . B-D INS SYR ULTRAFINE 1CC/30G 30G X 1/2" 1 ML MISC USE TO INJECT INSULIN DAILY 100 each 2  . ferrous sulfate 325 (65 FE) MG tablet Take 1 tablet (325 mg total) by mouth daily with breakfast. (Patient taking differently: Take 325 mg by mouth 2 (two) times daily with a meal. ) 30 tablet 5  . insulin glargine (LANTUS) 100 UNIT/ML injection Inject 0.35 mLs (35 Units total) into the skin daily. 40 mL 3  . Insulin Pen Needle (PEN NEEDLES) 32G X 6 MM MISC 1 each by Does not apply route daily. 100 each 3  . losartan-hydrochlorothiazide (HYZAAR) 100-25 MG tablet Take 1 tablet by mouth daily. 90 tablet 3  . lovastatin (MEVACOR) 40 MG tablet TAKE 1 TABLET BY MOUTH AT  BEDTIME 90 tablet 2  . meloxicam (MOBIC) 7.5 MG tablet Take 1 tablet (7.5 mg total) by mouth daily. (Patient not taking: Reported on 11/09/2017) 15 tablet 0  . metFORMIN (GLUCOPHAGE) 1000 MG tablet TAKE 1 TABLET (1,000 MG TOTAL) BY MOUTH 2 (TWO) TIMES DAILY WITH A MEAL. 180 tablet 3   No current facility-administered medications on file prior to visit.    Past Medical History:  Diagnosis Date  . Arthritis   . Diabetes mellitus    type 2  . Ganglion cyst of wrist  10/14/2013  . Hypertension   . KNEE MENISCUS INJURY, UNSPECIFIED 07/12/2006   Qualifier: History of  By: Carmie End MD, Erin     Vitals:   11/29/18 0847  BP: 124/68  Pulse: 87  SpO2: 98%    Review of Systems  Constitutional: Negative.   Respiratory: Negative.   Cardiovascular: Negative.   Gastrointestinal: Negative.   Musculoskeletal: Negative.   All other systems reviewed and are negative.      Objective:   Physical Exam Vitals signs and nursing note reviewed.  Constitutional:      Appearance: She is not ill-appearing.  Cardiovascular:     Rate and Rhythm: Normal rate and regular rhythm.     Heart sounds: Normal heart sounds. No murmur.  Pulmonary:     Effort: Pulmonary effort is normal. No respiratory distress.     Breath sounds: Normal breath sounds. No stridor. No wheezing or rhonchi.  Abdominal:     General: Abdomen is flat. Bowel sounds are normal. There is no distension.     Palpations: Abdomen is soft. There is no mass.  Musculoskeletal:     Left knee: She exhibits decreased range of motion. She exhibits no swelling and no deformity. Tenderness found.     Right lower leg: No edema.     Left lower  leg: No edema.     Comments: Antalgic gait due to pain.  Neurological:     Mental Status: She is alert.        Assessment:     DM2 HTN Palpitation    Plan:     Check problem list

## 2018-12-02 ENCOUNTER — Other Ambulatory Visit: Payer: Self-pay | Admitting: Family Medicine

## 2018-12-04 NOTE — Telephone Encounter (Signed)
Per patients primary care Doctor she can hold off on getting the holter monitor for now.

## 2018-12-05 ENCOUNTER — Other Ambulatory Visit: Payer: Self-pay

## 2018-12-05 ENCOUNTER — Ambulatory Visit
Admission: RE | Admit: 2018-12-05 | Discharge: 2018-12-05 | Disposition: A | Discharge status: Home / Self Care | Payer: Commercial Managed Care - PPO | Source: Ambulatory Visit | Attending: Family Medicine | Admitting: Family Medicine

## 2018-12-05 DIAGNOSIS — Z1231 Encounter for screening mammogram for malignant neoplasm of breast: Secondary | ICD-10-CM

## 2019-01-22 ENCOUNTER — Other Ambulatory Visit: Payer: Self-pay | Admitting: Family Medicine

## 2019-01-23 LAB — HM DIABETES EYE EXAM

## 2019-01-24 ENCOUNTER — Other Ambulatory Visit: Payer: Self-pay | Admitting: Family Medicine

## 2019-02-11 ENCOUNTER — Encounter: Payer: Self-pay | Admitting: Gynecology

## 2019-03-02 ENCOUNTER — Ambulatory Visit (HOSPITAL_COMMUNITY)
Admission: EM | Admit: 2019-03-02 | Discharge: 2019-03-02 | Disposition: A | Discharge status: Home / Self Care | Payer: Commercial Managed Care - PPO | Attending: Emergency Medicine | Admitting: Emergency Medicine

## 2019-03-02 ENCOUNTER — Other Ambulatory Visit: Payer: Self-pay

## 2019-03-02 DIAGNOSIS — T7840XA Allergy, unspecified, initial encounter: Secondary | ICD-10-CM | POA: Diagnosis not present

## 2019-03-02 MED ORDER — METHYLPREDNISOLONE SODIUM SUCC 125 MG IJ SOLR
INTRAMUSCULAR | Status: AC
  Filled 2019-03-02: qty 2

## 2019-03-02 MED ORDER — DIPHENHYDRAMINE HCL 25 MG PO TABS: 25.0000 mg | ORAL_TABLET | Freq: Four times a day (QID) | ORAL | 0 refills | Status: DC | PRN

## 2019-03-02 MED ORDER — FAMOTIDINE 20 MG PO TABS
40.0000 mg | ORAL_TABLET | Freq: Once | ORAL | Status: AC
  Administered 2019-03-02: 40 mg via ORAL

## 2019-03-02 MED ORDER — DIPHENHYDRAMINE HCL 50 MG/ML IJ SOLN
INTRAMUSCULAR | Status: AC
  Filled 2019-03-02: qty 1

## 2019-03-02 MED ORDER — DIPHENHYDRAMINE HCL 25 MG PO CAPS
ORAL_CAPSULE | ORAL | Status: AC
  Filled 2019-03-02: qty 1

## 2019-03-02 MED ORDER — DIPHENHYDRAMINE HCL 50 MG/ML IJ SOLN
25.0000 mg | Freq: Once | INTRAMUSCULAR | Status: AC
  Administered 2019-03-02: 25 mg via INTRAMUSCULAR

## 2019-03-02 MED ORDER — FAMOTIDINE 20 MG PO TABS
ORAL_TABLET | ORAL | Status: AC
  Filled 2019-03-02: qty 1

## 2019-03-02 MED ORDER — FAMOTIDINE 20 MG PO TABS: 20.0000 mg | ORAL_TABLET | Freq: Two times a day (BID) | ORAL | 0 refills | Status: DC

## 2019-03-02 MED ORDER — PREDNISONE 10 MG (21) PO TBPK: ORAL_TABLET | Freq: Every day | ORAL | 0 refills | Status: DC

## 2019-03-02 MED ORDER — METHYLPREDNISOLONE SODIUM SUCC 125 MG IJ SOLR
125.0000 mg | Freq: Once | INTRAMUSCULAR | Status: AC
  Administered 2019-03-02: 125 mg via INTRAMUSCULAR

## 2019-03-02 NOTE — Discharge Instructions (Signed)
Definitely wash the sheets/linens you had received, and observe for potential allergens at your new apartment.  Take another benadryl in 2-3 hours, followed by every 6 hours until your symptoms have resolved.  Prednisone pack, start tomorrow, please be mindful and careful of your blood sugar while taking this as if can elevate your blood sugar.  Twice a day pepcid.  If any of your symptoms worsen, facial swelling, lip swelling, throat itching, wheezing or shortness of breath, please return or go to the Er.

## 2019-03-02 NOTE — ED Provider Notes (Signed)
[place]    CSN: [id number] Arrival date & time: 03/02/19  1100      History   Chief Complaint Chief Complaint  Patient presents with   Allergic Reaction    HPI Tiaira Crosthwaite is a 63 y.o. female.   [name] presents with complaints of severe itching to skin as well as swelling to eyes. States she first noted itching to her hands and arms this morning around 8. She looked in the mirror and realized her eyes were swelling. No eye pain or itching. Now with itching to trunk legs and arms. No new products or foods. She moved into a new apartment two days ago, however. Also states that she used towels and sheets someone had given her, first time last night, and she had not washed them first. No wheezing, shortness of breath , or oral swelling. No throat itching, lip swelling, or difficulty swallowing. Denies any previous similar. States she has had sensitive skin in the past but denies  Any previous known allergic responses. History  Of dm, states her blood sugar was 98 this am. History  Of htn.     ROS per HPI, negative if not otherwise mentioned.      Past Medical History:  Diagnosis Date   Arthritis    Diabetes mellitus    type 2   Ganglion cyst of wrist 10/14/2013   Hypertension    KNEE MENISCUS INJURY, UNSPECIFIED 07/12/2006   Qualifier: History of  By: Carmie End MD, Erin      Patient Active Problem List   Diagnosis Date Noted   Palpitations 07/25/2018   Muscle cramp 04/05/2018   Paresthesia 04/05/2018   Primary osteoarthritis of right knee 03/01/2017   Diabetes mellitus type 2, controlled ([place]) 07/09/2015   Abnormal vaginal bleeding 07/29/2013   Diabetic neuropathy ([place]) 09/30/2012   Atypical ductal hyperplasia of breast 12/15/2010   SHOULDER PAIN, LEFT 03/03/2008   Hyperlipidemia 12/28/2006   CARPAL TUNNEL SYNDROME, BILATERAL 12/28/2006   OBESITY, NOS 07/12/2006   Anemia 07/12/2006   HYPERTENSION, BENIGN SYSTEMIC  07/12/2006    Past Surgical History:  Procedure Laterality Date   ABDOMINAL HYSTERECTOMY     has ovaries   BREAST EXCISIONAL BIOPSY  May 2011   TOTAL KNEE ARTHROPLASTY Right 03/01/2017   Procedure: RIGHT TOTAL KNEE ARTHROPLASTY;  Surgeon: Susa Day, MD;  Location: [facility];  Service: Orthopedics;  Laterality: Right;  120 mins with abductor block    OB History    Gravida  2   Para  2   Term      Preterm      AB      Living  2     SAB      TAB      Ectopic      Multiple      Live Births               Home Medications    Prior to Admission medications   Medication Sig Start Date End Date Taking? Authorizing Provider  aspirin EC 81 MG tablet Take 81 mg by mouth daily.    [provider]  diclofenac sodium (VOLTAREN) 1 % GEL Apply 4 g topically 4 (four) times daily. 11/29/18   Kinnie Feil, MD  diphenhydrAMINE (BENADRYL) 25 MG tablet Take 1 tablet (25 mg total) by mouth every 6 (six) hours as needed. 03/02/19   Zigmund Gottron, NP  famotidine (PEPCID) 20 MG tablet Take 1 tablet (20 mg total)  by mouth 2 (two) times daily. 03/02/19   Zigmund Gottron, NP  ferrous sulfate 325 (65 FE) MG tablet Take 1 tablet (325 mg total) by mouth daily with breakfast. Patient taking differently: Take 325 mg by mouth 2 (two) times daily with a meal.  09/12/11   Katherina Mires, MD  glucose blood (ONETOUCH VERIO) test strip Use to check capillary blood glucose TID 01/22/19   Andrena Mews T, MD  insulin glargine (LANTUS) 100 UNIT/ML injection Inject 0.35 mLs (35 Units total) into the skin daily. 10/15/17   Kinnie Feil, MD  Insulin Pen Needle (PEN NEEDLES) 32G X 6 MM MISC 1 each by Does not apply route daily. 07/05/17   Leavy Cella, RPH  Insulin Syringe-Needle U-100 (B-D INS SYR ULTRAFINE 1CC/30G) 30G X 1/2" 1 ML MISC USE TO INJECT INSULIN DAILY as instructed 11/29/18   Andrena Mews T, MD  Lancets (ONETOUCH ULTRASOFT) lancets Use as instructed 11/29/18   Kinnie Feil, MD  losartan-hydrochlorothiazide (HYZAAR) 100-25 MG tablet TAKE 1 TABLET BY MOUTH  DAILY 12/03/18   Andrena Mews T, MD  lovastatin (MEVACOR) 40 MG tablet TAKE 1 TABLET BY MOUTH AT  BEDTIME 10/08/18   Eniola, Tawanna Solo T, MD  metFORMIN (GLUCOPHAGE) 1000 MG tablet TAKE 1 TABLET BY MOUTH TWO  TIMES DAILY WITH A MEAL 01/27/19   Kinnie Feil, MD  predniSONE (STERAPRED UNI-PAK 21 TAB) 10 MG (21) TBPK tablet Take by mouth daily. Per box instruction 03/02/19   Zigmund Gottron, NP    Family History Family History  Problem Relation Age of Onset   Heart disease Mother    Stroke Father    Cancer Brother        Lung   Heart disease Sister     Social History Social History   Tobacco Use   Smoking status: Never Smoker   Smokeless tobacco: Never Used  Substance Use Topics   Alcohol use: No   Drug use: No     Allergies   Ace inhibitors   Review of Systems Review of Systems   Physical Exam Triage Vital Signs ED Triage Vitals  Enc Vitals Group     BP 03/02/19 1111 (!) 173/86     Pulse Rate 03/02/19 1111 (!) 113     Resp 03/02/19 1111 20     Temp 03/02/19 1111 (!) 97.3 F (36.3 C)     Temp src --      SpO2 03/02/19 1111 99 %     Weight --      Height --      Head Circumference --      Peak Flow --      Pain Score 03/02/19 1109 0     Pain Loc --      Pain Edu? --      Excl. in [place]? --    No data found.  Updated Vital Signs BP (!) 173/86    Pulse (!) 113    Temp (!) 97.3 F (36.3 C)    Resp 20    SpO2 99%    Physical Exam Constitutional:      General: She is not in acute distress.    Appearance: She is well-developed.  HENT:     Head: Normocephalic.     Mouth/Throat:     Lips: Pink.     Mouth: Mucous membranes are moist.     Pharynx: Oropharynx is clear. Uvula midline. No pharyngeal swelling, posterior oropharyngeal erythema or uvula  swelling.  Eyes:     Comments: Bilateral eye lids with generalized swelling; no redness or pain; clear tearing  noted   Cardiovascular:     Rate and Rhythm: Regular rhythm. Tachycardia present.     Heart sounds: Normal heart sounds.  Pulmonary:     Effort: Pulmonary effort is normal.     Breath sounds: Normal breath sounds. No decreased breath sounds or wheezing.     Comments: No increased work of breathing; lungs clear  Skin:    General: Skin is warm and dry.     Comments: Patient is aggressively scratching at her forearms and legs; difficult to discern rash at this time as there is now nail marks from dry skin and now from scratching to her back, arms and legs; slightly raised and red noted   Neurological:     Mental Status: She is alert and oriented to person, place, and time.      UC Treatments / Results  Labs (all labs ordered are listed, but only abnormal results are displayed) Labs Reviewed - No data to display  EKG   Radiology No results found.  Procedures Procedures (including critical care time)  Medications Ordered in UC Medications  methylPREDNISolone sodium succinate (SOLU-MEDROL) 125 mg/2 mL injection 125 mg (125 mg Intramuscular Given 03/02/19 1138)  diphenhydrAMINE (BENADRYL) injection 25 mg (25 mg Intramuscular Given 03/02/19 1137)  famotidine (PEPCID) tablet 40 mg (40 mg Oral Given 03/02/19 1137)  methylPREDNISolone sodium succinate (SOLU-MEDROL) 125 mg/2 mL injection (has no administration in time range)  famotidine (PEPCID) 20 MG tablet (has no administration in time range)  diphenhydrAMINE (BENADRYL) 25 mg capsule (has no administration in time range)  diphenhydrAMINE (BENADRYL) 50 MG/ML injection (has no administration in time range)  famotidine (PEPCID) 20 MG tablet (has no administration in time range)    Initial Impression / Assessment and Plan / UC Course  I have reviewed the triage vital signs and the nursing notes.  Pertinent labs & imaging results that were available during my care of the patient were reviewed by me and considered in my medical  decision making (see chart for details).     12:04: patient endorses improvement in itching; eye swelling is obviously improving as well.   12:25; patient eye swellign continues to improve and no longer scratching her skin, endorses significant improvement in symptoms and itching.    Obvious allergic response. Sounds likely from a detergent from linens she used for the first time last night which has not been washed and had been given to her. No airway involvement. Prednisone, benadryl and pepid at time of discharge with blood sugar elevation precautions discussed. Return precautions provided. Patient verbalized understanding and agreeable to plan.   Final Clinical Impressions(s) / UC Diagnoses   Final diagnoses:  Allergic reaction, initial encounter     Discharge Instructions     Definitely wash the sheets/linens you had received, and observe for potential allergens at your new apartment.  Take another benadryl in 2-3 hours, followed by every 6 hours until your symptoms have resolved.  Prednisone pack, start tomorrow, please be mindful and careful of your blood sugar while taking this as if can elevate your blood sugar.  Twice a day pepcid.  If any of your symptoms worsen, facial swelling, lip swelling, throat itching, wheezing or shortness of breath, please return or go to the Er.     ED Prescriptions    Medication Sig Dispense Auth. Provider   diphenhydrAMINE (BENADRYL) 25 MG tablet  Take 1 tablet (25 mg total) by mouth every 6 (six) hours as needed. 30 tablet Augusto Gamble B, NP   famotidine (PEPCID) 20 MG tablet Take 1 tablet (20 mg total) by mouth 2 (two) times daily. 30 tablet Augusto Gamble B, NP   predniSONE (STERAPRED UNI-PAK 21 TAB) 10 MG (21) TBPK tablet Take by mouth daily. Per box instruction 21 tablet Zigmund Gottron, NP     PDMP not reviewed this encounter.   Zigmund Gottron, NP 03/02/19 1233

## 2019-03-02 NOTE — ED Triage Notes (Signed)
Pt presents with rash all over and eyes swollen, pt is scratching during assessment , rash developed suddenly this morning

## 2019-03-12 ENCOUNTER — Encounter: Payer: Self-pay | Admitting: Family Medicine

## 2019-03-14 ENCOUNTER — Ambulatory Visit (INDEPENDENT_AMBULATORY_CARE_PROVIDER_SITE_OTHER): Payer: Commercial Managed Care - PPO | Admitting: Family Medicine

## 2019-03-14 ENCOUNTER — Other Ambulatory Visit: Payer: Self-pay

## 2019-03-14 ENCOUNTER — Encounter: Payer: Self-pay | Admitting: Family Medicine

## 2019-03-14 VITALS — BP 128/58 | HR 97 | Ht 67.0 in | Wt 194.2 lb

## 2019-03-14 DIAGNOSIS — H9203 Otalgia, bilateral: Secondary | ICD-10-CM

## 2019-03-14 DIAGNOSIS — E1149 Type 2 diabetes mellitus with other diabetic neurological complication: Secondary | ICD-10-CM

## 2019-03-14 DIAGNOSIS — Z23 Encounter for immunization: Secondary | ICD-10-CM | POA: Diagnosis not present

## 2019-03-14 DIAGNOSIS — E1165 Type 2 diabetes mellitus with hyperglycemia: Secondary | ICD-10-CM

## 2019-03-14 DIAGNOSIS — I10 Essential (primary) hypertension: Secondary | ICD-10-CM | POA: Diagnosis not present

## 2019-03-14 DIAGNOSIS — Z794 Long term (current) use of insulin: Secondary | ICD-10-CM | POA: Diagnosis not present

## 2019-03-14 DIAGNOSIS — E118 Type 2 diabetes mellitus with unspecified complications: Secondary | ICD-10-CM

## 2019-03-14 LAB — POCT GLYCOSYLATED HEMOGLOBIN (HGB A1C): HbA1c, POC (controlled diabetic range): 8.2 % — AB (ref 0.0–7.0)

## 2019-03-14 MED ORDER — LOVASTATIN 40 MG PO TABS: 40.0000 mg | ORAL_TABLET | Freq: Every day | ORAL | 2 refills | Status: DC

## 2019-03-14 MED ORDER — METFORMIN HCL 1000 MG PO TABS: ORAL_TABLET | ORAL | 2 refills | Status: DC

## 2019-03-14 MED ORDER — LOSARTAN POTASSIUM-HCTZ 100-25 MG PO TABS: 1.0000 | ORAL_TABLET | Freq: Every day | ORAL | 2 refills | Status: DC

## 2019-03-14 MED ORDER — ZOSTER VAC RECOMB ADJUVANTED 50 MCG/0.5ML IM SUSR: 0.5000 mL | Freq: Once | INTRAMUSCULAR | 1 refills | Status: AC

## 2019-03-14 MED ORDER — INSULIN GLARGINE 100 UNIT/ML ~~LOC~~ SOLN: 35.0000 [IU] | Freq: Every day | SUBCUTANEOUS | 3 refills | Status: DC

## 2019-03-14 NOTE — Assessment & Plan Note (Signed)
BP looks good on current regimen. Med refilled.

## 2019-03-14 NOTE — Patient Instructions (Signed)

## 2019-03-14 NOTE — Assessment & Plan Note (Signed)
A1C worsened. May be due to Steroid use. I will continue her current insulin dose of Lantus 35 units Qd and Metformin 1000 mg BID. Recheck A1C in 3 months. Monitor CBG closely. She verbalized understanding.

## 2019-03-14 NOTE — Assessment & Plan Note (Signed)
Currently asymptomatic 

## 2019-03-14 NOTE — Progress Notes (Addendum)
Subjective:     Patient ID: Kathy Duncan, female   DOB: [birth year], 63 y.o.   MRN: [id number]  Diabetes She presents for her follow-up diabetic visit. She has type 2 diabetes mellitus. No MedicAlert identification noted. There are no hypoglycemic associated symptoms. Pertinent negatives for hypoglycemia include no dizziness or headaches. Pertinent negatives for diabetes include no foot paresthesias, no polydipsia, no polyphagia, no visual change (fatigu) and no weakness. (Occasional numbness and tingling of her feet. Currently no numbness or tingling) There are no hypoglycemic complications. Symptoms are worsening (Her glucos has been running high since she was started on a tapering dose of prednisone for allergies about 2 weeks ago. She is still on steroid. Prior to prednisone her CBG has been in the 100s, but now in the 200s). Risk factors for coronary artery disease include dyslipidemia, diabetes mellitus and hypertension. She is compliant with treatment all of the time. Exercise: Walking and some squatting twice a week. An ACE inhibitor/angiotensin II receptor blocker is being taken. Eye exam is current.  Otalgia  There is pain in both (itching a pain in both ears) ears. This is a new problem. Episode onset: More than 1 month. Episode frequency: currently asymptomatic, last episode was 3 days ago. The problem has been waxing and waning. There has been no fever. The pain is at a severity of 0/10 (pain is usually 8/10 in severity whenever she has it). The pain is moderate. Pertinent negatives include no ear discharge, headaches, hearing loss, rash, sore throat or vomiting. Treatments tried: hydrogen peroxide. The treatment provided mild relief. There is no history of a chronic ear infection or hearing loss.  [id number] with meds. No concern today. Obese: She walks twice a week and is working on her diet.   Current Outpatient Medications on File Prior to Visit  Medication Sig Dispense Refill   . aspirin EC 81 MG tablet Take 81 mg by mouth daily.    . famotidine (PEPCID) 20 MG tablet Take 1 tablet (20 mg total) by mouth 2 (two) times daily. 30 tablet 0  . ferrous sulfate 325 (65 FE) MG tablet Take 1 tablet (325 mg total) by mouth daily with breakfast. (Patient taking differently: Take 325 mg by mouth 2 (two) times daily with a meal. ) 30 tablet 5  . insulin glargine (LANTUS) 100 UNIT/ML injection Inject 0.35 mLs (35 Units total) into the skin daily. 40 mL 3  . losartan-hydrochlorothiazide (HYZAAR) 100-25 MG tablet TAKE 1 TABLET BY MOUTH  DAILY 90 tablet 2  . lovastatin (MEVACOR) 40 MG tablet TAKE 1 TABLET BY MOUTH AT  BEDTIME 90 tablet 2  . metFORMIN (GLUCOPHAGE) 1000 MG tablet TAKE 1 TABLET BY MOUTH TWO  TIMES DAILY WITH A MEAL 180 tablet 2  . predniSONE (STERAPRED UNI-PAK 21 TAB) 10 MG (21) TBPK tablet Take by mouth daily. Per box instruction 21 tablet 0  . diclofenac sodium (VOLTAREN) 1 % GEL Apply 4 g topically 4 (four) times daily. (Patient not taking: Reported on 03/14/2019) 350 g 3  . diphenhydrAMINE (BENADRYL) 25 MG tablet Take 1 tablet (25 mg total) by mouth every 6 (six) hours as needed. (Patient not taking: Reported on 03/14/2019) 30 tablet 0  . glucose blood (ONETOUCH VERIO) test strip Use to check capillary blood glucose TID 300 strip 3  . Insulin Pen Needle (PEN NEEDLES) 32G X 6 MM MISC 1 each by Does not apply route daily. 100 each 3  . Insulin Syringe-Needle U-100 (B-D INS SYR ULTRAFINE 1CC/30G) 30G  X 1/2" 1 ML MISC USE TO INJECT INSULIN DAILY as instructed 100 each 3  . Lancets (ONETOUCH ULTRASOFT) lancets Use as instructed 100 each 3   No current facility-administered medications on file prior to visit.    Past Medical History:  Diagnosis Date  . Arthritis   . Diabetes mellitus    type 2  . Ganglion cyst of wrist 10/14/2013  . Hypertension   . KNEE MENISCUS INJURY, UNSPECIFIED 07/12/2006   Qualifier: History of  By: Carmie End MD, Erin     Vitals:   03/14/19 1138   BP: (!) 128/58  Pulse: 97  SpO2: 100%  Weight: 194 lb 3.2 oz (88.1 kg)  Height: 5\' 7"  (1.702 m)      Current Outpatient Medications on File Prior to Visit  Medication Sig Dispense Refill  . aspirin EC 81 MG tablet Take 81 mg by mouth daily.    . diclofenac sodium (VOLTAREN) 1 % GEL Apply 4 g topically 4 (four) times daily. 350 g 3  . diphenhydrAMINE (BENADRYL) 25 MG tablet Take 1 tablet (25 mg total) by mouth every 6 (six) hours as needed. 30 tablet 0  . famotidine (PEPCID) 20 MG tablet Take 1 tablet (20 mg total) by mouth 2 (two) times daily. 30 tablet 0  . ferrous sulfate 325 (65 FE) MG tablet Take 1 tablet (325 mg total) by mouth daily with breakfast. (Patient taking differently: Take 325 mg by mouth 2 (two) times daily with a meal. ) 30 tablet 5  . glucose blood (ONETOUCH VERIO) test strip Use to check capillary blood glucose TID 300 strip 3  . insulin glargine (LANTUS) 100 UNIT/ML injection Inject 0.35 mLs (35 Units total) into the skin daily. 40 mL 3  . Insulin Pen Needle (PEN NEEDLES) 32G X 6 MM MISC 1 each by Does not apply route daily. 100 each 3  . Insulin Syringe-Needle U-100 (B-D INS SYR ULTRAFINE 1CC/30G) 30G X 1/2" 1 ML MISC USE TO INJECT INSULIN DAILY as instructed 100 each 3  . Lancets (ONETOUCH ULTRASOFT) lancets Use as instructed 100 each 3  . losartan-hydrochlorothiazide (HYZAAR) 100-25 MG tablet TAKE 1 TABLET BY MOUTH  DAILY 90 tablet 2  . lovastatin (MEVACOR) 40 MG tablet TAKE 1 TABLET BY MOUTH AT  BEDTIME 90 tablet 2  . metFORMIN (GLUCOPHAGE) 1000 MG tablet TAKE 1 TABLET BY MOUTH TWO  TIMES DAILY WITH A MEAL 180 tablet 2  . predniSONE (STERAPRED UNI-PAK 21 TAB) 10 MG (21) TBPK tablet Take by mouth daily. Per box instruction 21 tablet 0   No current facility-administered medications on file prior to visit.    Past Medical History:  Diagnosis Date  . Arthritis   . Diabetes mellitus    type 2  . Ganglion cyst of wrist 10/14/2013  . Hypertension   . KNEE MENISCUS  INJURY, UNSPECIFIED 07/12/2006   Qualifier: History of  By: Carmie End MD, Erin     Vitals:   03/14/19 1138  Weight: 194 lb 3.2 oz (88.1 kg)  Height: 5\' 7"  (1.702 m)     Review of Systems  HENT: Positive for ear pain. Negative for ear discharge, hearing loss and sore throat.   Respiratory: Negative.   Cardiovascular: Negative.   Gastrointestinal: Negative.  Negative for vomiting.  Endocrine: Negative for polydipsia and polyphagia.  Musculoskeletal: Negative.   Skin: Negative for rash.  Neurological: Negative for dizziness, weakness and headaches.  All other systems reviewed and are negative.      Objective:  Physical Exam Vitals signs and nursing note reviewed.  HENT:     Head: Normocephalic.     Right Ear: Hearing, ear canal and external ear normal.     Left Ear: Hearing, tympanic membrane, ear canal and external ear normal.     Ears:     Comments: Right cerumen impaction Cardiovascular:     Rate and Rhythm: Normal rate and regular rhythm.     Heart sounds: Normal heart sounds. No murmur.  Pulmonary:     Effort: Pulmonary effort is normal. No respiratory distress.     Breath sounds: Normal breath sounds. No wheezing or rhonchi.  Abdominal:     General: Abdomen is flat. Bowel sounds are normal. There is no distension.     Palpations: Abdomen is soft. There is no mass.     Tenderness: There is no abdominal tenderness.  Musculoskeletal:     Right lower leg: No edema.     Left lower leg: No edema.        Assessment:     DM 2 Otalgia: No sign of infection HTN obesity    Plan:     Check problem list.  Note for cerumen impaction, we were unable to do ear lavage since we are out of the lavage tip. With her consent, I attempted curettage but was unsuccessful. Debrox OTC recommended. Concern for allergy as a possible cause of her itching and pain. May use OTC Zytec daily. Flu shot given today. Shingrix offered. She will get vaccinated at the pharmacy.

## 2019-03-14 NOTE — Addendum Note (Signed)
Addended by: Andrena Mews T on: 03/14/2019 06:26 PM   Modules accepted: Orders

## 2019-03-14 NOTE — Addendum Note (Signed)
Addended by: Andrena Mews T on: 03/14/2019 01:47 PM   Modules accepted: Orders

## 2019-04-21 ENCOUNTER — Other Ambulatory Visit: Payer: Self-pay | Admitting: Family Medicine

## 2019-06-05 ENCOUNTER — Other Ambulatory Visit: Payer: Self-pay | Admitting: *Deleted

## 2019-06-05 MED ORDER — LOVASTATIN 40 MG PO TABS: 40.0000 mg | ORAL_TABLET | Freq: Every day | ORAL | 2 refills | Status: DC

## 2019-06-16 ENCOUNTER — Other Ambulatory Visit: Payer: Self-pay | Admitting: Family Medicine

## 2019-06-30 ENCOUNTER — Encounter: Payer: Self-pay | Admitting: Family Medicine

## 2019-07-01 ENCOUNTER — Other Ambulatory Visit: Payer: Self-pay | Admitting: Family Medicine

## 2019-07-01 ENCOUNTER — Other Ambulatory Visit: Payer: Self-pay

## 2019-07-01 ENCOUNTER — Ambulatory Visit (INDEPENDENT_AMBULATORY_CARE_PROVIDER_SITE_OTHER): Payer: Managed Care, Other (non HMO) | Admitting: Family Medicine

## 2019-07-01 ENCOUNTER — Encounter: Payer: Self-pay | Admitting: Family Medicine

## 2019-07-01 VITALS — BP 144/70 | HR 86 | Ht 67.0 in | Wt 203.0 lb

## 2019-07-01 DIAGNOSIS — E1169 Type 2 diabetes mellitus with other specified complication: Secondary | ICD-10-CM

## 2019-07-01 DIAGNOSIS — Z794 Long term (current) use of insulin: Secondary | ICD-10-CM

## 2019-07-01 DIAGNOSIS — E1165 Type 2 diabetes mellitus with hyperglycemia: Secondary | ICD-10-CM

## 2019-07-01 DIAGNOSIS — E785 Hyperlipidemia, unspecified: Secondary | ICD-10-CM

## 2019-07-01 DIAGNOSIS — E118 Type 2 diabetes mellitus with unspecified complications: Secondary | ICD-10-CM

## 2019-07-01 DIAGNOSIS — E669 Obesity, unspecified: Secondary | ICD-10-CM

## 2019-07-01 DIAGNOSIS — D649 Anemia, unspecified: Secondary | ICD-10-CM

## 2019-07-01 DIAGNOSIS — I10 Essential (primary) hypertension: Secondary | ICD-10-CM | POA: Diagnosis not present

## 2019-07-01 DIAGNOSIS — Z6831 Body mass index (BMI) 31.0-31.9, adult: Secondary | ICD-10-CM

## 2019-07-01 LAB — POCT GLYCOSYLATED HEMOGLOBIN (HGB A1C): HbA1c, POC (controlled diabetic range): 8 % — AB (ref 0.0–7.0)

## 2019-07-01 MED ORDER — DAPAGLIFLOZIN PROPANEDIOL 5 MG PO TABS: 5.0000 mg | ORAL_TABLET | Freq: Every day | ORAL | 0 refills | Status: DC

## 2019-07-01 NOTE — Assessment & Plan Note (Signed)
Weight loss counseling completed during this visit. She will continue to work on diet and exercise for weight loss. She is committed to losing weight and I suspect at next visit, there will be some improvement.

## 2019-07-01 NOTE — Progress Notes (Signed)
cbc

## 2019-07-01 NOTE — Assessment & Plan Note (Addendum)
She will return to lab tomorrow fasting for FLP. Future order placed.

## 2019-07-01 NOTE — Progress Notes (Signed)
   CHIEF COMPLAINT / HPI:  DM2:Here for f/u. She is compliant with Lantus 35 units qd and Metformin 1000 mg BID. Her diet is off and she has not been getting a lot of exercise. Her home CBG ranges from 53 lowest to 123 highest (75, 123,73,78,86,77,53,85) in the past week.  HTN/HLD: She is compliant with her Hyzaar 100/25 qd and Lovastatin 40 mg qd for her HLD. Here for f/u. Obesity:Here for f/u. She has not been able to get adequate exercise and has been eating a lot of bread and sandwiches.   PERTINENT  PMH / PSH: PMX reviewed   OBJECTIVE: Vitals:   07/01/19 1003 07/01/19 1019  BP: (!) 160/80 (!) 144/70  Pulse: 86   SpO2: 99%   Weight: 203 lb (92.1 kg)   Height: 5\' 7"  (1.702 m)    Physical Exam Vitals and nursing note reviewed.  Cardiovascular:     Rate and Rhythm: Normal rate and regular rhythm.     Pulses: Normal pulses.     Heart sounds: Normal heart sounds. No murmur.  Pulmonary:     Effort: Pulmonary effort is normal. No respiratory distress.     Breath sounds: Normal breath sounds. No wheezing or rhonchi.  Abdominal:     General: Abdomen is flat. Bowel sounds are normal. There is no distension.     Palpations: Abdomen is soft. There is no mass.     Tenderness: There is no abdominal tenderness.  Musculoskeletal:     Right lower leg: No edema.     Left lower leg: No edema.  Neurological:     General: No focal deficit present.     Mental Status: She is alert and oriented to person, place, and time.  Psychiatric:        Behavior: Behavior normal.      ASSESSMENT / PLAN:  Uncontrolled diabetes mellitus ([place]) A1C improved just a bit from her last visit. Diet and exercise reinforced. She is however, invested in diet control for weight loss. Continue Lantus 35 units QD and Metformin 1000 mg BID. I added Wilder Glade to her regimen. Continue home CBG monitoring. F/U in 4-6 weeks for reassessment on her new meds.   HYPERTENSION, BENIGN SYSTEMIC BP elevated initially  but improved after rest. Continue current regimen. Home BP monitoring encouraged.  Hyperlipidemia She will return to lab tomorrow fasting for FLP. Future order placed.  OBESITY, NOS Weight loss counseling completed during this visit. She will continue to work on diet and exercise for weight loss. She is committed to losing weight and I suspect at next visit, there will be some improvement.     Andrena Mews, MD [place]

## 2019-07-01 NOTE — Patient Instructions (Signed)

## 2019-07-01 NOTE — Assessment & Plan Note (Signed)
A1C improved just a bit from her last visit. Diet and exercise reinforced. She is however, invested in diet control for weight loss. Continue Lantus 35 units QD and Metformin 1000 mg BID. I added Wilder Glade to her regimen. Continue home CBG monitoring. F/U in 4-6 weeks for reassessment on her new meds.

## 2019-07-01 NOTE — Assessment & Plan Note (Signed)
BP elevated initially but improved after rest. Continue current regimen. Home BP monitoring encouraged.

## 2019-07-02 ENCOUNTER — Other Ambulatory Visit: Payer: Managed Care, Other (non HMO)

## 2019-07-02 DIAGNOSIS — E785 Hyperlipidemia, unspecified: Secondary | ICD-10-CM

## 2019-07-02 DIAGNOSIS — D649 Anemia, unspecified: Secondary | ICD-10-CM

## 2019-07-02 DIAGNOSIS — E1169 Type 2 diabetes mellitus with other specified complication: Secondary | ICD-10-CM

## 2019-07-03 ENCOUNTER — Telehealth: Payer: Self-pay | Admitting: Family Medicine

## 2019-07-03 LAB — BASIC METABOLIC PANEL
BUN/Creatinine Ratio: 19 (ref 12–28)
BUN: 15 mg/dL (ref 8–27)
CO2: 27 mmol/L (ref 20–29)
Calcium: 9.9 mg/dL (ref 8.7–10.3)
Chloride: 102 mmol/L (ref 96–106)
Creatinine, Ser: 0.78 mg/dL (ref 0.57–1.00)
GFR calc Af Amer: 94 mL/min/{1.73_m2} (ref >59)
GFR calc non Af Amer: 81 mL/min/{1.73_m2} (ref >59)
Glucose: 106 mg/dL — ABNORMAL HIGH (ref 65–99)
Potassium: 4.2 mmol/L (ref 3.5–5.2)
Sodium: 142 mmol/L (ref 134–144)

## 2019-07-03 LAB — LIPID PANEL
Chol/HDL Ratio: 2.6 ratio (ref 0.0–4.4)
Cholesterol, Total: 169 mg/dL (ref 100–199)
HDL: 64 mg/dL (ref >39)
LDL Chol Calc (NIH): 91 mg/dL (ref 0–99)
Triglycerides: 73 mg/dL (ref 0–149)
VLDL Cholesterol Cal: 14 mg/dL (ref 5–40)

## 2019-07-03 LAB — CBC
Hematocrit: 35.6 % (ref 34.0–46.6)
Hemoglobin: 11.7 g/dL (ref 11.1–15.9)
MCH: 26 pg — ABNORMAL LOW (ref 26.6–33.0)
MCHC: 32.9 g/dL (ref 31.5–35.7)
MCV: 79 fL (ref 79–97)
Platelets: 273 10*3/uL (ref 150–450)
RBC: 4.5 x10E6/uL (ref 3.77–5.28)
RDW: 13.3 % (ref 11.7–15.4)
WBC: 4.5 10*3/uL (ref 3.4–10.8)

## 2019-07-03 MED ORDER — ROSUVASTATIN CALCIUM 20 MG PO TABS: 20.0000 mg | ORAL_TABLET | Freq: Every day | ORAL | 1 refills | Status: DC

## 2019-07-03 NOTE — Telephone Encounter (Signed)
FLP report discussed with her. Her LDL is still above goal. She is currently on moderate intensity Statins (Mevacor 40 mg qd). I discussed switching her to a high intensity Statins, which she agreed with. D/C Mevacor and start Crestor 20 mg qd. F/U as planned.

## 2019-08-11 ENCOUNTER — Encounter: Payer: Self-pay | Admitting: Family Medicine

## 2019-08-12 ENCOUNTER — Telehealth: Payer: Self-pay | Admitting: Family Medicine

## 2019-08-12 ENCOUNTER — Other Ambulatory Visit: Payer: Self-pay

## 2019-08-12 ENCOUNTER — Ambulatory Visit (INDEPENDENT_AMBULATORY_CARE_PROVIDER_SITE_OTHER): Payer: Managed Care, Other (non HMO) | Admitting: Family Medicine

## 2019-08-12 ENCOUNTER — Encounter: Payer: Self-pay | Admitting: Family Medicine

## 2019-08-12 DIAGNOSIS — E1165 Type 2 diabetes mellitus with hyperglycemia: Secondary | ICD-10-CM | POA: Diagnosis not present

## 2019-08-12 DIAGNOSIS — L299 Pruritus, unspecified: Secondary | ICD-10-CM | POA: Diagnosis not present

## 2019-08-12 DIAGNOSIS — I10 Essential (primary) hypertension: Secondary | ICD-10-CM | POA: Diagnosis not present

## 2019-08-12 MED ORDER — "BD INSULIN SYRINGE U/F 30G X 1/2"" 1 ML MISC": 6 refills | Status: DC

## 2019-08-12 MED ORDER — CONTOUR NEXT TEST VI STRP: ORAL_STRIP | 6 refills | Status: DC

## 2019-08-12 MED ORDER — LOSARTAN POTASSIUM-HCTZ 100-25 MG PO TABS: 1.0000 | ORAL_TABLET | Freq: Every day | ORAL | 2 refills | Status: DC

## 2019-08-12 MED ORDER — BASAGLAR KWIKPEN 100 UNIT/ML ~~LOC~~ SOPN: 25.0000 [IU] | PEN_INJECTOR | Freq: Every day | SUBCUTANEOUS | 2 refills | Status: DC

## 2019-08-12 MED ORDER — INSULIN GLARGINE 100 UNIT/ML ~~LOC~~ SOLN: 25.0000 [IU] | Freq: Every day | SUBCUTANEOUS | 3 refills | Status: DC

## 2019-08-12 MED ORDER — MICROLET LANCETS MISC: 6 refills | Status: DC

## 2019-08-12 MED ORDER — AMLODIPINE BESYLATE 5 MG PO TABS: 5.0000 mg | ORAL_TABLET | Freq: Every day | ORAL | 3 refills | Status: DC

## 2019-08-12 MED ORDER — METFORMIN HCL 1000 MG PO TABS: ORAL_TABLET | ORAL | 2 refills | Status: DC

## 2019-08-12 MED ORDER — DIPHENHYDRAMINE HCL 25 MG PO TABS: 25.0000 mg | ORAL_TABLET | Freq: Four times a day (QID) | ORAL | 0 refills | Status: DC | PRN

## 2019-08-12 NOTE — Telephone Encounter (Signed)
HIPAA compliant callback message left.  Note: If she calls, inform her that he insurance did not cover her Lantus, hence, I have switched her insulin to Basaglar at 25 units daily.  I will see her back in 3 weeks as planned.  Continue home CBG monitoring.

## 2019-08-12 NOTE — Assessment & Plan Note (Signed)
BP uncontrolled. Added Norvasc 5 mg QD to her [place]. Refill send to her pharmacy. She will start monitoring BP at home. F/U in 3 weeks for reassessment. Sooner if BP is running too low or too high. She agreed with the plan.

## 2019-08-12 NOTE — Progress Notes (Signed)
Patient ID: Kathy Duncan, female   DOB: 1956-02-01, 64 y.o.   MRN: [id number] Got a message from the pharmacy. Her insurance will not cover Lantus. Preferred Basaglar or Levemir.  Will switch to [place] which is 1:1 ratio with Lantus. I attempted to call to discuss with no response. I will contact her again soon.

## 2019-08-12 NOTE — Patient Instructions (Signed)
It was nice seeing you today. I am sorry that your glucose is running at home and your BP is elevated today. Please do more exercise for stress relieve. Start Norvasc 5 mg daily in addition to your current BP medication. Please cut back on your insulin to 25 units daily. I will see you back in  4 weeks.

## 2019-08-12 NOTE — Progress Notes (Signed)
    SUBJECTIVE:   CHIEF COMPLAINT / HPI:   DM2/HTN: She is here for follow-up. Compliant with Farxiga 5 mg qd, Metformin 1000 mg BID and Lantus 35 units qd for her DM2. Her home CBG is running low, with the lowest of 44 and highest of 199. She feels sweaty and numbness in her hands whenever her sugar goes low. She takes peanut butter sandwich to bring up her glucose. She has more lows than highs. Losartan/HCTZ 100/25 mg qd for her BP. She took her medication, a few minutes before this appointment. She denies any concerns. She does not check her BP at home. She has a machine at home. She is stress, she gets less sleep and her neighbor is giving her an issue, she is planning to move. Itching: Occasional itchiness. Trigger unknown. Improves with Benadryl. She requested refill.  Need refill of her Contour Next test strip, Microlet lancet, BD ultra-fine Insulin synringes.  PERTINENT  PMH / PSH: PMX reviewed.  OBJECTIVE:   BP (!) 162/90 Comment: left arm  Pulse 78   Ht 5\' 7"  (1.702 m)   Wt 198 lb 3.2 oz (89.9 kg)   SpO2 100%   BMI 31.04 kg/m   Physical Exam Vitals reviewed.  Cardiovascular:     Rate and Rhythm: Normal rate and regular rhythm.     Heart sounds: Normal heart sounds. No murmur.  Pulmonary:     Effort: Pulmonary effort is normal. No respiratory distress.     Breath sounds: Normal breath sounds. No wheezing or rhonchi.  Abdominal:     General: Abdomen is flat. Bowel sounds are normal.     Palpations: Abdomen is soft. There is no mass.     Tenderness: There is no abdominal tenderness.  Musculoskeletal:     Right lower leg: No edema.     Left lower leg: No edema.      ASSESSMENT/PLAN:   Uncontrolled diabetes mellitus (HCC) Farxiga 5 mg qd was added during her last visit. Now she is having issues with hypoglycemia. Plan to reduce Lantus to 25 units qd. Continue current dose of Farxiga and Metformin. I refilled Metformin and Lantus. Continue home CBG  monitoring. F/U in 3 weeks or sooner if sugar remains too high or too low.  HYPERTENSION, BENIGN SYSTEMIC BP uncontrolled. Added Norvasc 5 mg QD to her Haazar. Refill send to her pharmacy. She will start monitoring BP at home. F/U in 3 weeks for reassessment. Sooner if BP is running too low or too high. She agreed with the plan.  Pruritus Etiology unknown. Likely allergies. Good response to Benadryl. I refilled her med. F/U as needed.     Andrena Mews, MD [place]

## 2019-08-12 NOTE — Assessment & Plan Note (Addendum)
Farxiga 5 mg qd was added during her last visit. Now she is having issues with hypoglycemia. Plan to reduce Lantus to 25 units qd. Continue current dose of Farxiga and Metformin. I refilled Metformin and Lantus. Continue home CBG monitoring. F/U in 3 weeks or sooner if sugar remains too high or too low.

## 2019-08-12 NOTE — Assessment & Plan Note (Signed)
Etiology unknown. Likely allergies. Good response to Benadryl. I refilled her med. F/U as needed.

## 2019-08-13 NOTE — Telephone Encounter (Signed)
Patient LVM on nursing line regarding returning phone call from Dr. Gwendlyn Deutscher. Patient states that we can leave information on voicemail as she is at work during the day. Informed patient of below information from Dr. Gwendlyn Deutscher. Advised patient to return call to office if she had any further questions.   Talbot Grumbling, RN

## 2019-08-20 ENCOUNTER — Other Ambulatory Visit: Payer: Self-pay | Admitting: *Deleted

## 2019-08-20 DIAGNOSIS — E1165 Type 2 diabetes mellitus with hyperglycemia: Secondary | ICD-10-CM

## 2019-08-20 MED ORDER — ONETOUCH DELICA LANCETS 33G MISC: 12 refills | Status: DC

## 2019-08-20 MED ORDER — ACCU-CHEK FASTCLIX LANCETS MISC: 12 refills | Status: DC

## 2019-08-20 MED ORDER — GLUCOSE BLOOD VI STRP: ORAL_STRIP | 12 refills | Status: DC

## 2019-08-20 MED ORDER — ACCU-CHEK GUIDE VI STRP: ORAL_STRIP | 12 refills | Status: DC

## 2019-08-20 NOTE — Addendum Note (Signed)
Addended by: Valerie Roys on: 08/20/2019 12:03 PM   Modules accepted: Orders

## 2019-08-20 NOTE — Telephone Encounter (Signed)
Received another request from pharmacy asking for strips and lancets to be changed to the one touch brand.  We called to confirm with pharmacy which actual brand is covered by patient's insurance before we send in another script that will be sent back.  Preferred strips and lancets sent to pharmacy. Kathy Duncan,CMA

## 2019-08-20 NOTE — Telephone Encounter (Signed)
Insurance covers the acc-chek guide strips.  Will forward to MD to approve script. Tedric Leeth,CMA

## 2019-09-02 ENCOUNTER — Encounter: Payer: Self-pay | Admitting: Family Medicine

## 2019-09-02 ENCOUNTER — Other Ambulatory Visit: Payer: Self-pay

## 2019-09-02 ENCOUNTER — Ambulatory Visit: Payer: Managed Care, Other (non HMO) | Admitting: Family Medicine

## 2019-09-02 DIAGNOSIS — I10 Essential (primary) hypertension: Secondary | ICD-10-CM | POA: Diagnosis not present

## 2019-09-02 DIAGNOSIS — E1165 Type 2 diabetes mellitus with hyperglycemia: Secondary | ICD-10-CM | POA: Diagnosis not present

## 2019-09-02 NOTE — Assessment & Plan Note (Signed)
Hypoglycemia resolved on her current regimen. Continue same. A1C in 4 weeks.

## 2019-09-02 NOTE — Assessment & Plan Note (Signed)
BP looks good on current regimen. I rechecked BP the second time and it remains at goal. Do not start Norvasc for now. Home BP monitoring recommended. She will obtain new BP machine. Return in 4 weeks with all medication bottles for review. She agreed with the plan.

## 2019-09-02 NOTE — Progress Notes (Signed)
    SUBJECTIVE:   CHIEF COMPLAINT / HPI:   HTN/DM2: here for f/u. She is yet to start her Norvasc but compliant with her Hazaar 100-25 qd. She does not check her BP at home. Her machine is not functioning well. Her home CBG improved, lowest in the low 100s and a max of 112 in the past few weeks. She denies hypoglycemic episodes since she cut back on her Basaglar to 25 units qd. She is compliant with Metformin 1000 mg BID and Farxiga 5 mg qd. HM: COVID-19 vaccine information. She came in with her vaccine card.  PERTINENT  PMH / PSH: PMX reviewed.  OBJECTIVE:   Vitals:   09/02/19 0828 09/02/19 0841  BP: 138/62 139/70  Pulse: 80   SpO2: 100%   Weight: 193 lb (87.5 kg)   Height: 5\' 7"  (1.702 m)     Physical Exam Vitals and nursing note reviewed.  Cardiovascular:     Rate and Rhythm: Normal rate and regular rhythm.     Heart sounds: Normal heart sounds. No murmur.  Pulmonary:     Effort: Pulmonary effort is normal. No respiratory distress.     Breath sounds: Normal breath sounds. No wheezing or rhonchi.  Abdominal:     General: Abdomen is flat. Bowel sounds are normal. There is no distension.     Palpations: Abdomen is soft. There is no mass.  Musculoskeletal:     Right lower leg: No edema.     Left lower leg: No edema.      ASSESSMENT/PLAN:   HYPERTENSION, BENIGN SYSTEMIC BP looks good on current regimen. I rechecked BP the second time and it remains at goal. Do not start Norvasc for now. Home BP monitoring recommended. She will obtain new BP machine. Return in 4 weeks with all medication bottles for review. She agreed with the plan.  Uncontrolled diabetes mellitus ([place]) Hypoglycemia resolved on her current regimen. Continue same. A1C in 4 weeks.    COVID-19 vaccine: vaccine card reviewed. We updated her vaccination record on file.  Andrena Mews, MD [place]

## 2019-09-02 NOTE — Patient Instructions (Signed)
It was nice seeing you today. Your BP looks good despite not taking Norvasc. We will discontinue Norvasc for now till I see you in 4 weeks. Please come in with your bottle at that visit. Call if you have any questions or concerns.

## 2019-09-22 ENCOUNTER — Other Ambulatory Visit: Payer: Self-pay | Admitting: Family Medicine

## 2019-09-26 ENCOUNTER — Encounter: Payer: Self-pay | Admitting: Family Medicine

## 2019-09-26 ENCOUNTER — Ambulatory Visit (INDEPENDENT_AMBULATORY_CARE_PROVIDER_SITE_OTHER): Payer: Managed Care, Other (non HMO) | Admitting: Family Medicine

## 2019-09-26 ENCOUNTER — Other Ambulatory Visit: Payer: Self-pay

## 2019-09-26 VITALS — BP 138/68 | HR 90 | Ht 67.0 in | Wt 191.8 lb

## 2019-09-26 DIAGNOSIS — E1165 Type 2 diabetes mellitus with hyperglycemia: Secondary | ICD-10-CM

## 2019-09-26 DIAGNOSIS — I10 Essential (primary) hypertension: Secondary | ICD-10-CM

## 2019-09-26 DIAGNOSIS — E1149 Type 2 diabetes mellitus with other diabetic neurological complication: Secondary | ICD-10-CM

## 2019-09-26 LAB — POCT GLYCOSYLATED HEMOGLOBIN (HGB A1C): HbA1c, POC (controlled diabetic range): 7.4 % — AB (ref 0.0–7.0)

## 2019-09-26 MED ORDER — BD PEN NEEDLE SHORT U/F 31G X 8 MM MISC: 5 refills | Status: DC

## 2019-09-26 NOTE — Assessment & Plan Note (Signed)
BP looks good. D/C HCTZ and continue Hyzaar at her current dose. Close home BP monitoring.

## 2019-09-26 NOTE — Progress Notes (Signed)
    SUBJECTIVE:   CHIEF COMPLAINT / HPI:   DM2/HTN: Patient is here for f/u. She is compliant with Farxiga 5 mg qd, Metformin 1000 mg BID and Lantus 25 units qd for her DM 2. She is on Hyzaar 100/25 qd. She denies any concerns today. Not taking HTCZ as instructed. She was to start her Basaglar on Wednesday, but realized that her Basaglar needle was not dispensed. She took her remaining Lantus on Wednesday and did not take any insulin yesterday.   DM Neuropathy: May 27th to foot doctor for burning and numbness, especially the heel part. Denies new symptoms.  PERTINENT  PMH / PSH: PMX reviewed  OBJECTIVE:   Vitals:   09/26/19 0853  BP: 138/68  Pulse: 90  SpO2: 99%  Weight: 191 lb 12.8 oz (87 kg)  Height: 5\' 7"  (1.702 m)     Physical Exam Vitals and nursing note reviewed.  Cardiovascular:     Rate and Rhythm: Normal rate and regular rhythm.     Heart sounds: Normal heart sounds. No murmur.  Pulmonary:     Effort: Pulmonary effort is normal. No respiratory distress.     Breath sounds: Normal breath sounds. No wheezing.  Abdominal:     General: Bowel sounds are normal. There is no distension.     Palpations: Abdomen is soft. There is no mass.     Tenderness: There is no abdominal tenderness.  Musculoskeletal:     Right lower leg: No edema.     Left lower leg: No edema.      ASSESSMENT/PLAN:   HYPERTENSION, BENIGN SYSTEMIC BP looks good. D/C HCTZ and continue Hyzaar at her current dose. Close home BP monitoring.  Uncontrolled diabetes mellitus (HCC) A1C down to 7.4 from 8 Continue current DM regimen. I called pharmacy and called in her Basaglar pen needles with refills. F/U in 3-4 months for reassessment. Doing well with her diet. Need to improve on exercise.  Diabetic neuropathy Follow-up with podiatrist as scheduled. Deferring Gabapentin till after her podiatrist's eval.     Kathy Mews, MD [place]

## 2019-09-26 NOTE — Assessment & Plan Note (Signed)
Follow-up with podiatrist as scheduled. Deferring Gabapentin till after her podiatrist's eval.

## 2019-09-26 NOTE — Patient Instructions (Signed)
It was nice seeing you today. Your A1C improved from 8 to 7.4  Continue current regimen. I will contact your pharmacy today to get your insulin straightened up. I will see you back in 3-4 months.

## 2019-09-26 NOTE — Assessment & Plan Note (Signed)
A1C down to 7.4 from 8 Continue current DM regimen. I called pharmacy and called in her Basaglar pen needles with refills. F/U in 3-4 months for reassessment. Doing well with her diet. Need to improve on exercise.

## 2019-10-09 ENCOUNTER — Ambulatory Visit (INDEPENDENT_AMBULATORY_CARE_PROVIDER_SITE_OTHER): Payer: Managed Care, Other (non HMO)

## 2019-10-09 ENCOUNTER — Other Ambulatory Visit: Payer: Self-pay

## 2019-10-09 ENCOUNTER — Encounter: Payer: Self-pay | Admitting: Podiatry

## 2019-10-09 ENCOUNTER — Ambulatory Visit: Payer: Managed Care, Other (non HMO) | Admitting: Podiatry

## 2019-10-09 DIAGNOSIS — M722 Plantar fascial fibromatosis: Secondary | ICD-10-CM

## 2019-10-09 MED ORDER — DICLOFENAC SODIUM 75 MG PO TBEC: 75.0000 mg | DELAYED_RELEASE_TABLET | Freq: Two times a day (BID) | ORAL | 2 refills | Status: DC

## 2019-10-09 NOTE — Patient Instructions (Signed)

## 2019-10-10 NOTE — Progress Notes (Signed)
Subjective:   Patient ID: [name], female   DOB: 64 y.o.   MRN: [id number]   HPI Patient presents with a lot of pain in the right heel and states is been going on now for several months.  Does not remember specific injury or other pathology and states she has never had a problem like this before   Review of Systems  All other systems reviewed and are negative.       Objective:  Physical Exam Vitals and nursing note reviewed.  Constitutional:      Appearance: She is well-developed.  Pulmonary:     Effort: Pulmonary effort is normal.  Musculoskeletal:        General: Normal range of motion.  Skin:    General: Skin is warm.  Neurological:     Mental Status: She is alert.     Neurovascular status intact muscle strength found to be adequate range of motion within normal limits.  Patient is noted to have exquisite discomfort plantar aspect right heel at the insertional point tendon calcaneus with mild discomfort also on the left with history of diabetic neuropathy that she tries to keep under control.  Patient has moderate depression of the arch was noted to have good digital perfusion     Assessment:  Acute plantar fasciitis right over left with inflammation fluid along with moderate depression of the arch     Plan:  H&P all conditions reviewed and x-rays reviewed.  Today I did sterile prep injected the plantar fascial right 3 mg Kenalog 5 mg Xylocaine applied fascial strap to lift up the arch gave instructions for anti-inflammatories exercises and support shoe gear.  Patient will be seen back to recheck  X-rays indicate small spur no indication of stress fracture arthritis

## 2019-10-23 ENCOUNTER — Other Ambulatory Visit: Payer: Self-pay

## 2019-10-23 ENCOUNTER — Encounter: Payer: Self-pay | Admitting: Podiatry

## 2019-10-23 ENCOUNTER — Ambulatory Visit: Payer: Managed Care, Other (non HMO) | Admitting: Podiatry

## 2019-10-23 VITALS — Temp 97.8°F

## 2019-10-23 DIAGNOSIS — M722 Plantar fascial fibromatosis: Secondary | ICD-10-CM

## 2019-10-26 NOTE — Progress Notes (Signed)
Subjective:   Patient ID: Kathy Duncan, female   DOB: 64 y.o.   MRN: [id number]   HPI Patient states I am feeling quite a bit better I still have pain only 5 and on them a lot but overall it is improved quite a bit   ROS      Objective:  Physical Exam  Neurovascular status intact with patient's plantar heels doing quite a bit better there is discomfort only upon deep palpation of the tendon and there is low-grade inflammation noted     Assessment:  Plantar fasciitis bilateral improved with conservative treatment     Plan:  H&P reviewed conditions and I have recommended anti-inflammatories to be continued physical therapy and also supportive shoe and reduced activity.  Patient will be seen back as needed

## 2019-11-03 ENCOUNTER — Other Ambulatory Visit: Payer: Self-pay | Admitting: Family Medicine

## 2019-11-03 DIAGNOSIS — Z1231 Encounter for screening mammogram for malignant neoplasm of breast: Secondary | ICD-10-CM

## 2019-12-03 NOTE — Telephone Encounter (Signed)
Error. Kathy Duncan, CMA  

## 2019-12-11 ENCOUNTER — Ambulatory Visit
Admission: RE | Admit: 2019-12-11 | Discharge: 2019-12-11 | Disposition: A | Discharge status: Home / Self Care | Payer: Managed Care, Other (non HMO) | Source: Ambulatory Visit | Attending: Family Medicine | Admitting: Family Medicine

## 2019-12-11 ENCOUNTER — Other Ambulatory Visit: Payer: Self-pay

## 2019-12-11 DIAGNOSIS — Z1231 Encounter for screening mammogram for malignant neoplasm of breast: Secondary | ICD-10-CM

## 2020-02-06 ENCOUNTER — Ambulatory Visit: Payer: Managed Care, Other (non HMO) | Admitting: Family Medicine

## 2020-02-06 ENCOUNTER — Other Ambulatory Visit: Payer: Self-pay

## 2020-02-06 ENCOUNTER — Encounter: Payer: Self-pay | Admitting: Family Medicine

## 2020-02-06 VITALS — BP 118/60 | HR 106 | Ht 67.0 in | Wt 193.0 lb

## 2020-02-06 DIAGNOSIS — E1165 Type 2 diabetes mellitus with hyperglycemia: Secondary | ICD-10-CM

## 2020-02-06 DIAGNOSIS — E1149 Type 2 diabetes mellitus with other diabetic neurological complication: Secondary | ICD-10-CM | POA: Diagnosis not present

## 2020-02-06 DIAGNOSIS — I10 Essential (primary) hypertension: Secondary | ICD-10-CM

## 2020-02-06 DIAGNOSIS — Z23 Encounter for immunization: Secondary | ICD-10-CM

## 2020-02-06 DIAGNOSIS — E785 Hyperlipidemia, unspecified: Secondary | ICD-10-CM

## 2020-02-06 LAB — POCT GLYCOSYLATED HEMOGLOBIN (HGB A1C): HbA1c, POC (controlled diabetic range): 8.2 % — AB (ref 0.0–7.0)

## 2020-02-06 MED ORDER — DIPHENHYDRAMINE HCL 25 MG PO TABS: 25.0000 mg | ORAL_TABLET | Freq: Three times a day (TID) | ORAL | 0 refills | Status: AC | PRN

## 2020-02-06 MED ORDER — DOXYCYCLINE HYCLATE 100 MG PO TABS
100.0000 mg | ORAL_TABLET | Freq: Two times a day (BID) | ORAL | 0 refills | Status: AC
Start: 2020-02-06 — End: 2020-02-11

## 2020-02-06 MED ORDER — ROSUVASTATIN CALCIUM 20 MG PO TABS: 20.0000 mg | ORAL_TABLET | Freq: Every day | ORAL | 1 refills | Status: DC

## 2020-02-06 MED ORDER — TRIAMCINOLONE ACETONIDE 0.5 % EX OINT
1.0000 | TOPICAL_OINTMENT | Freq: Two times a day (BID) | CUTANEOUS | 0 refills | Status: DC
Start: 2020-02-06 — End: 2020-06-17

## 2020-02-06 MED ORDER — DAPAGLIFLOZIN PROPANEDIOL 10 MG PO TABS: 10.0000 mg | ORAL_TABLET | Freq: Every day | ORAL | 1 refills | Status: DC

## 2020-02-06 MED ORDER — BASAGLAR KWIKPEN 100 UNIT/ML ~~LOC~~ SOPN: 25.0000 [IU] | PEN_INJECTOR | Freq: Every day | SUBCUTANEOUS | 2 refills | Status: DC

## 2020-02-06 NOTE — Assessment & Plan Note (Addendum)
Test result discussed. Continue Crestor 20 mg qd. I called her pharmacy and confirmed that her Lovastatin was discontinued as she is uncertain which she is taking now. F/U in 6-12 months for repeat FLP. She agreed with the plan.  Pharmacy confirmed d/c of Lovastatin. Crestor refilled.

## 2020-02-06 NOTE — Assessment & Plan Note (Signed)
Her BP looks good on Hyzaar 100/25 mg qd.

## 2020-02-06 NOTE — Patient Instructions (Signed)

## 2020-02-06 NOTE — Assessment & Plan Note (Signed)
She is compliant with Metformin 1000 mg BID, Farxiga 5 mg qd, Basagalr 25 units QD. Despite this, her A1c worsened to 8.2 today. I increased her Wilder Glade to 10 mg qd. F/U in 3 months for reassessment.

## 2020-02-06 NOTE — Progress Notes (Signed)
    SUBJECTIVE:   CHIEF COMPLAINT / HPI:  DM2/HTN/HLD:She is compliant with all here meds. Here for f/u.  Rash: C/O rash on both of her hands of a few month duration. Rash is itchy, no redness. She used OTC meds with no improvement. Wearing gloves makes it worse. 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  Boil:C/O painful bumps on her butt which started a few weeks ago. She uses topical neosporin with no improvement.  PERTINENT  PMH / PSH: PMX reviewed  OBJECTIVE:   BP 118/60   Pulse (!) 106   Ht 5\' 7"  (1.702 m)   Wt 193 lb (87.5 kg)   SpO2 97%   BMI 30.23 kg/m   Physical Exam Vitals reviewed.  Cardiovascular:     Rate and Rhythm: Normal rate and regular rhythm.     Heart sounds: Normal heart sounds. No murmur heard.   Pulmonary:     Effort: Pulmonary effort is normal. No respiratory distress.     Breath sounds: Normal breath sounds. No wheezing or rhonchi.  Abdominal:     General: Abdomen is flat. Bowel sounds are normal. There is no distension.     Palpations: Abdomen is soft. There is no mass.     Tenderness: There is no abdominal tenderness.  Musculoskeletal:     Cervical back: Neck supple.     Right lower leg: No edema.     Left lower leg: No edema.  Skin:    Comments: Scanty papular rash on the dorsum of her hands B/L Firm, nodular, tender lesion a few inches away from her right perineum. Open healing ulcer on her left perineal area      ASSESSMENT/PLAN:   Diabetic neuropathy She is compliant with Metformin 1000 mg BID, Farxiga 5 mg qd, Basagalr 25 units QD. Despite this, her A1c worsened to 8.2 today. I increased her Wilder Glade to 10 mg qd. F/U in 3 months for reassessment.  HYPERTENSION, BENIGN SYSTEMIC Her BP looks good on Hyzaar 100/25 mg qd.  Hyperlipidemia Test result discussed. Continue Crestor 20 mg qd. I called her pharmacy and confirmed that her Lovastatin was discontinued as she is uncertain which she is taking now. F/U in  6-12 months for repeat FLP. She agreed with the plan.  Pharmacy confirmed d/c of Lovastatin. Crestor refilled.   Contact dermatitis: Triamcinolone prescribed.  Furuncle: Doxycycline prescribed. Return precaution discussed   Kathy Mews, MD [place]

## 2020-03-24 ENCOUNTER — Telehealth: Payer: Self-pay

## 2020-03-24 NOTE — Telephone Encounter (Signed)
Patient calls nurse line regarding concerns with low blood pressure. Patient reports the following readings:  105/64, 109/78, 122/78. Patient reports intermittent dizziness over the last few days.   Advised patient to change positions slowly and to drink plenty of fluids.   Scheduled patient for first available appointment tomorrow morning. Strict ED precautions given.   Talbot Grumbling, RN

## 2020-03-25 ENCOUNTER — Ambulatory Visit (HOSPITAL_COMMUNITY)
Admission: RE | Admit: 2020-03-25 | Discharge: 2020-03-25 | Disposition: A | Discharge status: Home / Self Care | Payer: Managed Care, Other (non HMO) | Source: Ambulatory Visit | Attending: Family Medicine | Admitting: Family Medicine

## 2020-03-25 ENCOUNTER — Ambulatory Visit: Payer: Managed Care, Other (non HMO) | Admitting: Family Medicine

## 2020-03-25 ENCOUNTER — Other Ambulatory Visit: Payer: Self-pay

## 2020-03-25 VITALS — BP 110/56 | HR 76 | Ht 67.0 in | Wt 187.2 lb

## 2020-03-25 DIAGNOSIS — R079 Chest pain, unspecified: Secondary | ICD-10-CM | POA: Insufficient documentation

## 2020-03-25 DIAGNOSIS — R031 Nonspecific low blood-pressure reading: Secondary | ICD-10-CM

## 2020-03-25 MED ORDER — NITROGLYCERIN 0.4 MG SL SUBL: 0.4000 mg | SUBLINGUAL_TABLET | SUBLINGUAL | 0 refills | Status: DC | PRN

## 2020-03-25 NOTE — Assessment & Plan Note (Signed)
While BP is still in normal range, significantly lower than her normal.  Could possibly consider in the setting of dehydration after significant diarrhea with antibiotic use, however would not expect such prolonged effect.  Heightened concern in the setting of concurrent recent chest pain as above as well, proceeding with cardiology referral as discussed.  Encouraged hydration and to half losartan/HCTZ tablet.  Monitor BP at home.

## 2020-03-25 NOTE — Progress Notes (Signed)
SUBJECTIVE:   CHIEF COMPLAINT / HPI: Low blood pressure  Kathy Duncan is a 64 year old female presenting for evaluation of low blood pressures.  She called into our nurse line yesterday due to intermittent lightheadedness for the past few days with BP readings including 105/64, 109/78, 122/78.  Scheduled for today's appointment and encouraged her to drink plenty of fluids.  Last few weeks she has felt tired, "alittle faint feeling." She is able to continue doing what she is doing. All started after she took a few days (~5 days) of doxycycline for a boil on her leg around end of October.  Caused a lot of diarrhea, however she stays very hydrated.  She has been having some chest discomfort "like something is running across my chest." Never had this before. Noticing this 'pressure' when she at work, feeling it with activity and movement. Has not noticed it at rest. A little tender when she presses her chest, makes a similar pain come on.  Had the worst episode of chest pain yesterday morning at work and felt sweaty with this.  Takes losartan-HCTZ 100-25mg  daily. Only other new medication is Crestor in 01/2020.  She otherwise denies any illness or fever.  Does cough on occasion which can be common for her when the weather changes.  Reports she feels better today. No lightheadedness left and not having any chest pain.     PERTINENT  PMH / PSH: T2DM (A1c 8), hypertension, osteoarthritis, elevated BMI, atypical ductal hyperplasia breast s/p resection in 2011  OBJECTIVE:   BP (!) 110/56   Pulse 76   Ht 5\' 7"  (1.702 m)   Wt 187 lb 4 oz (84.9 kg)   SpO2 96%   BMI 29.33 kg/m   General: Alert, NAD, well appearing  HEENT: NCAT, MMM, no JVD Cardiac: RRR no m/g/r, tender with palpation around anterior 3-5th intercostal spaces with palpation with reproducing similar chest pain. Lungs: Clear bilaterally, no increased WOB on RA, no wheezing or crackles noted Abdomen: soft, non-tender Msk: Moves all  extremities spontaneously  Ext: Warm, dry, 2+ distal pulses, no pitting edema bilaterally   EKG normal sinus rhythm without any pathologic Q waves or ST changes.  Similar to previous.  ASSESSMENT/PLAN:   Chest pain Subacute, differential including MSK, anginal/CAD, viral, medication side effect--may be multifactorial.  Certainly considering costochondritis as palpation can reproduce similar discomfort.  However, given her description with exertional pain associated with diaphoresis yesterday in the setting of several known risk factors (age, T2DM with A1c above goal, BMI 29, HTN), certainly will need further cardiology evaluation.  Reassuringly without chest pain today (last ~24 hours prior) and EKG NSR with no ischemic changes.  Will place urgent cardiology referral, start aspirin 81 mg, and rx nitro.  Obtain CBC and BMP to ensure no contributing abnormality.  Blood pressure lower than prior measurement While BP is still in normal range, significantly lower than her normal.  Could possibly consider in the setting of dehydration after significant diarrhea with antibiotic use, however would not expect such prolonged effect.  Heightened concern in the setting of concurrent recent chest pain as above as well, proceeding with cardiology referral as discussed.  Encouraged hydration and to half losartan/HCTZ tablet.  Monitor BP at home.    ED precautions strictly discussed including recurrence of chest pain especially if increased in severity or persistent, associate with any difficulty breathing/redness of breath, diaphoresis, or lightheadedness/dizziness.  Discussed with Dr. Andria Frames who additionally talked with patient.  Recommend follow-up in the  next week either with our clinic or cardiology.   Kathy Duncan, [place]

## 2020-03-25 NOTE — Assessment & Plan Note (Addendum)
Subacute, differential including MSK, anginal/CAD, viral, medication side effect--may be multifactorial.  Certainly considering costochondritis as palpation can reproduce similar discomfort.  However, given her description with exertional pain associated with diaphoresis yesterday in the setting of several known risk factors (age, T2DM with A1c above goal, BMI 29, HTN), certainly will need further cardiology evaluation.  Reassuringly without chest pain today (last ~24 hours prior) and EKG NSR with no ischemic changes.  Will place urgent cardiology referral, start aspirin 81 mg, and rx nitro.  Obtain CBC and BMP to ensure no contributing abnormality.

## 2020-03-25 NOTE — Patient Instructions (Signed)
It was wonderful to see you today.  Plan: 1.  Please start taking 1/2 tablet of your blood pressure medicine instead of 1 full tablet.  Please keep an eye on your blood pressure and keep a journal of this. 2.  Start taking aspirin 81 mg daily 3.  I have sent in a medication called nitro, this is only on a as needed basis if you are having severe chest pain. 4.  I have placed a referral to cardiology for further evaluation, I am hoping that you can be seen quite quickly. 5.  You can also try heat on her chest for 20 minutes at a time you can do this several times a day in the area of discomfort.  If you are not able to be seen by cardiology next week or early week after, please follow-up in our clinic at that time.  Please go to the ED if your chest pain is severe and persistent with any sweating, feeling lightheaded, shortness of breath.

## 2020-03-26 LAB — CBC WITH DIFFERENTIAL/PLATELET
Basophils Absolute: 0 10*3/uL (ref 0.0–0.2)
Basos: 1 %
EOS (ABSOLUTE): 0.2 10*3/uL (ref 0.0–0.4)
Eos: 5 %
Hematocrit: 35.1 % (ref 34.0–46.6)
Hemoglobin: 11.1 g/dL (ref 11.1–15.9)
Immature Grans (Abs): 0 10*3/uL (ref 0.0–0.1)
Immature Granulocytes: 0 %
Lymphocytes Absolute: 1.7 10*3/uL (ref 0.7–3.1)
Lymphs: 43 %
MCH: 25.2 pg — ABNORMAL LOW (ref 26.6–33.0)
MCHC: 31.6 g/dL (ref 31.5–35.7)
MCV: 80 fL (ref 79–97)
Monocytes Absolute: 0.3 10*3/uL (ref 0.1–0.9)
Monocytes: 7 %
Neutrophils Absolute: 1.8 10*3/uL (ref 1.4–7.0)
Neutrophils: 44 %
Platelets: 310 10*3/uL (ref 150–450)
RBC: 4.41 x10E6/uL (ref 3.77–5.28)
RDW: 13.5 % (ref 11.7–15.4)
WBC: 3.9 10*3/uL (ref 3.4–10.8)

## 2020-03-26 LAB — BASIC METABOLIC PANEL
BUN/Creatinine Ratio: 24 (ref 12–28)
BUN: 23 mg/dL (ref 8–27)
CO2: 24 mmol/L (ref 20–29)
Calcium: 9.6 mg/dL (ref 8.7–10.3)
Chloride: 104 mmol/L (ref 96–106)
Creatinine, Ser: 0.97 mg/dL (ref 0.57–1.00)
GFR calc Af Amer: 71 mL/min/{1.73_m2} (ref >59)
GFR calc non Af Amer: 62 mL/min/{1.73_m2} (ref >59)
Glucose: 145 mg/dL — ABNORMAL HIGH (ref 65–99)
Potassium: 4.3 mmol/L (ref 3.5–5.2)
Sodium: 142 mmol/L (ref 134–144)

## 2020-04-13 ENCOUNTER — Encounter: Payer: Self-pay | Admitting: Internal Medicine

## 2020-04-13 ENCOUNTER — Other Ambulatory Visit: Payer: Self-pay

## 2020-04-13 ENCOUNTER — Ambulatory Visit: Payer: Managed Care, Other (non HMO) | Admitting: Internal Medicine

## 2020-04-13 VITALS — BP 102/58 | HR 90 | Ht 67.0 in | Wt 189.0 lb

## 2020-04-13 DIAGNOSIS — I209 Angina pectoris, unspecified: Secondary | ICD-10-CM

## 2020-04-13 DIAGNOSIS — E785 Hyperlipidemia, unspecified: Secondary | ICD-10-CM | POA: Diagnosis not present

## 2020-04-13 DIAGNOSIS — E119 Type 2 diabetes mellitus without complications: Secondary | ICD-10-CM | POA: Insufficient documentation

## 2020-04-13 DIAGNOSIS — R072 Precordial pain: Secondary | ICD-10-CM | POA: Diagnosis not present

## 2020-04-13 DIAGNOSIS — I10 Essential (primary) hypertension: Secondary | ICD-10-CM

## 2020-04-13 MED ORDER — METOPROLOL TARTRATE 100 MG PO TABS
ORAL_TABLET | ORAL | 0 refills | Status: DC
Start: 2020-04-13 — End: 2020-08-17

## 2020-04-13 NOTE — Progress Notes (Signed)
Cardiology Office Note:    Date:  04/13/2020   ID:  Kathy Duncan, DOB [birth year], MRN [id number]  PCP:  Kinnie Feil, MD  [facility] Cardiologist:  Werner Lean, MD  [place] Electrophysiologist:  None   CC: Chest pain Consulted for the evaluation of chest pain at the behest of Kinnie Feil, MD  History of Present Illness:    Kathy Duncan is a 64 y.o. female with a hx of Diabetes with Hypertension, HLD, who presents for evaluation of chest pain.  Patient notes alright right now but has had feelings of chest pain intermittently.  Patient notes that she had a feeling of chest pain 03/31/20.  Works at [employer] and her work sometimes includes pushing boxes.  Notes some resting chest pain.  Discomfort worsens with activity and improves with rest and with sublingual nitroglycerin.  Patient exerts herself with pushing boxes at work feels tired starts sweating, with chest pain. No radiating pain.  No shortness of breath at rest. Notes  DOE with mild activity.  No PND or orthopnea.  No bendopnea.  No syncope or near syncope.  Has used to nitroglycerin since PCP visit.  Patient reports no prior cardiac testing including  echo,  stress test, heart catheterizations,  cardioversion, or ablations.  Notes no palpitations or funny heart beats.     Ambulatory BP 122/78  Past Medical History:  Diagnosis Date  . Abnormal vaginal bleeding 07/29/2013  . Anemia 07/12/2006   09/12/11: Anemia panel not suggestive of iron or B12 deficiency.  CBC with diff shows no pancytopenia.  Colonoscopy 2008 and referral in 2010 for heme positive stools not suggestive of need for further evaluation.  Most likely due to tamoxifen.   . Arthritis   . Diabetes mellitus    type 2  . Ganglion cyst of wrist 10/14/2013  . Hypertension   . KNEE MENISCUS INJURY, UNSPECIFIED 07/12/2006   Qualifier: History of  By: Carmie End MD, Junie Panning      Past Surgical History:  Procedure Laterality  Date  . ABDOMINAL HYSTERECTOMY     has ovaries  . BREAST EXCISIONAL BIOPSY Right May 2011  . 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 dapagliflozin propanediol (FARXIGA) 10 MG TABS tablet Take 1 tablet (10 mg total) by mouth daily.  . diphenhydrAMINE (BENADRYL) 25 MG tablet Take 1 tablet (25 mg total) by mouth every 8 (eight) hours as needed.  . ferrous sulfate 325 (65 FE) MG tablet Take 1 tablet (325 mg total) by mouth daily with breakfast. (Patient taking differently: Take 325 mg by mouth 2 (two) times daily with a meal. )  . glucose blood test strip Use to check CBG TID  . Insulin Glargine (BASAGLAR KWIKPEN) 100 UNIT/ML Inject 25 Units into the skin daily.  . Insulin Pen Needle (B-D ULTRAFINE III SHORT PEN) 31G X 8 MM MISC Use to give insulin injections daily as instructed  . Insulin Syringe-Needle U-100 (B-D INS SYR ULTRAFINE 1CC/30G) 30G X 1/2" 1 ML MISC Use to check capillary glucose TID  . losartan-hydrochlorothiazide (HYZAAR) 100-25 MG tablet Take 1 tablet by mouth daily. (Patient taking differently: Take 0.5 tablets by mouth daily. )  . metFORMIN (GLUCOPHAGE) 1000 MG tablet TAKE 1 TABLET BY MOUTH TWO  TIMES DAILY WITH  A MEAL  . nitroGLYCERIN (NITROSTAT) 0.4 MG SL tablet Place 1 tablet (0.4 mg total) under the tongue every 5 (five) minutes as needed for chest pain.  Glory Rosebush Delica Lancets 12R MISC Use to check CBG TID  . rosuvastatin (CRESTOR) 20 MG tablet Take 1 tablet (20 mg total) by mouth daily.  Marland Kitchen triamcinolone ointment (KENALOG) 0.5 % Apply 1 application topically 2 (two) times daily.     Allergies:   Ace inhibitors   Social History   Socioeconomic History  . Marital status: Married    Spouse name: Not on file  . Number of children: Not on file  .  Years of education: Not on file  . Highest education level: Not on file  Occupational History  . Not on file  Tobacco Use  . Smoking status: Never Smoker  . Smokeless tobacco: Never Used  Vaping Use  . Vaping Use: Never used  Substance and Sexual Activity  . Alcohol use: No  . Drug use: No  . Sexual activity: Not Currently    Comment: 1st intercourse 64 yo-Fewer than 5 partners  Other Topics Concern  . Not on file  Social History Narrative   Works at [employer], does some heavy lifting.  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   Social Determinants of Health   Financial Resource Strain:   . Difficulty of Paying Living Expenses: Not on file  Food Insecurity:   . Worried About Charity fundraiser in the Last Year: Not on file  . Ran Out of Food in the Last Year: Not on file  Transportation Needs:   . Lack of Transportation (Medical): Not on file  . Lack of Transportation (Non-Medical): Not on file  Physical Activity:   . Days of Exercise per Week: Not on file  . Minutes of Exercise per Session: Not on file  Stress:   . Feeling of Stress : Not on file  Social Connections:   . Frequency of Communication with Friends and Family: Not on file  . Frequency of Social Gatherings with Friends and Family: Not on file  . Attends Religious Services: Not on file  . Active Member of Clubs or Organizations: Not on file  . Attends Archivist Meetings: Not on file  . Marital Status: Not on file    Family History: The patient's family history includes Cancer in her brother; Heart disease in her mother and sister; Stroke in her father.  Mother- CABG at age 57 Sister - CABG at age 82  ROS:   Please see the history of present illness.    All other systems reviewed and are negative.  EKGs/Labs/Other Studies Reviewed:    The following studies were reviewed today:  EKG:   03/25/20:  SR rate 63, no ST/T changes Recent Labs: 03/25/2020: BUN 23; Creatinine, Ser 0.97; Hemoglobin  11.1; Platelets 310; Potassium 4.3; Sodium 142  Recent Lipid Panel    Component Value Date/Time   CHOL 169 07/02/2019 0904   TRIG 73 07/02/2019 0904   HDL 64 07/02/2019 0904   CHOLHDL 2.6 07/02/2019 0904   CHOLHDL 2.3 12/28/2015 1121   VLDL 18 12/28/2015 1121   LDLCALC 91 07/02/2019 0904    Risk Assessment/Calculations:     ASCVD Risk of 19.1  Physical Exam:    VS:  BP (!) 102/58   Pulse 90   Ht '5\' 7"'  (1.702 m)   Wt 189 lb (85.7 kg)   SpO2 97%   BMI 29.60 kg/m  Wt Readings from Last 3 Encounters:  04/13/20 189 lb (85.7 kg)  03/25/20 187 lb 4 oz (84.9 kg)  02/06/20 193 lb (87.5 kg)     GEN: Well nourished, well developed in no acute distress HEENT: Normal NECK: No JVD; No carotid bruits LYMPHATICS: No lymphadenopathy CARDIAC: RRR, no murmurs, rubs, gallops +2 radial bilaterally RESPIRATORY:  Clear to auscultation without rales, wheezing or rhonchi  ABDOMEN: Soft, non-tender, non-distended MUSCULOSKELETAL:  No edema; No deformity  SKIN: Warm and dry NEUROLOGIC:  Alert and oriented x 3 PSYCHIATRIC:  Normal affect   ASSESSMENT:    1. Angina pectoris ([place])   2. Diabetes mellitus with coincident hypertension ([place])   3. Hyperlipidemia, unspecified hyperlipidemia type   4. Precordial pain    PLAN:    In order of problems listed above:  Angina Pectoris - The patient presents with cardiac chest pain (stable angina) - ASCVD risk estimated at 19% + given family history - ASA 81 mg QD start - Continue sublingual nitroglycerin as need for chest pain - Would recommend CCTA  +/- FFR to exclude obstructive CAD  - if positive, discussed risks and benefits of cardiac catheterization have been discussed with the patient.  These include bleeding, infection, kidney damage, stroke, heart attack, death.  The patient understands these risks and is willing to proceed if necessary  Diabetes with Hypertension - ambulatory blood pressure < 135/85, will continue ambulatory BP  monitoring; gave education on how to perform ambulatory blood pressure monitoring including the frequency and technique; goal ambulatory blood pressure < 135/85 on average - continue home medications  - discussed diet (DASH/low sodium), and exercise/weight loss interventions   Hyperlipidemia -LDL goal less than 100 -continue current statin for 2 months (started last month) - will recheck lipids and lfts at next visit - gave education on dietary changes  2-3 follow up unless new symptoms or abnormal test results warranting change in plan  Would be reasonable for  APP Follow up   Shared Decision Making/Informed Consent        Medication Adjustments/Labs and Tests Ordered: Current medicines are reviewed at length with the patient today.  Concerns regarding medicines are outlined above.  Orders Placed This Encounter  Procedures  . CT CORONARY MORPH W/CTA COR W/SCORE W/CA W/CM &/OR WO/CM  . CT CORONARY FRACTIONAL FLOW RESERVE DATA PREP  . CT CORONARY FRACTIONAL FLOW RESERVE FLUID ANALYSIS  . Basic metabolic panel  . Hepatic function panel  . Lipid panel   Meds ordered this encounter  Medications  . metoprolol tartrate (LOPRESSOR) 100 MG tablet    Sig: Take 1 tablet by mouth 2 hours prior to Cardiac CT    Dispense:  1 tablet    Refill:  0    Patient Instructions  Medication Instructions:  Your physician recommends that you continue on your current medications as directed. Please refer to the Current Medication list given to you today.  *If you need a refill on your cardiac medications before your next appointment, please call your pharmacy*   Lab Work: None today  Your physician recommends that you return for a FASTING lipid profile and liver function panel in 2-3 months  Your physician recommends that you return for lab work before your Cardiac CT  If you have labs (blood work) drawn today and your tests are completely normal, you will receive your results only  by: Marland Kitchen MyChart Message (if you have MyChart) OR . A paper copy in the mail If you have any  lab test that is abnormal or we need to change your treatment, we will call you to review the results.   Testing/Procedures: Your physician has requested that you have cardiac CT.   Follow-Up: At [facility], you and your health needs are our priority.  As part of our continuing mission to provide you with exceptional heart care, we have created designated Provider Care Teams.  These Care Teams include your primary Cardiologist (physician) and Advanced Practice Providers (APPs -  Physician Assistants and Nurse Practitioners) who all work together to provide you with the care you need, when you need it.  We recommend signing up for the patient portal called "MyChart".  Sign up information is provided on this After Visit Summary.  MyChart is used to connect with patients for Virtual Visits (Telemedicine).  Patients are able to view lab/test results, encounter notes, upcoming appointments, etc.  Non-urgent messages can be sent to your provider as well.   To learn more about what you can do with MyChart, go to [email or web address].    Your next appointment:   2 month(s)  The format for your next appointment:   In Person  Provider:   Rudean Haskell, MD   Other Instructions Your cardiac CT will be scheduled at one of the below locations:   [facility] [address] [place], [place] [address] [phone]  [place] [address] [place], [place] [address] [phone]  If scheduled at [facility], please arrive at the [facility] main entrance of [facility] 30 minutes prior to test start time. Proceed to the [facility] Radiology Department (first floor) to check-in and test prep.  If scheduled at [facility], please arrive 15 mins early for check-in and test  prep.  Please follow these instructions carefully (unless otherwise directed):   On the Night Before the Test: . Be sure to Drink plenty of water. . Do not consume any caffeinated/decaffeinated beverages or chocolate 12 hours prior to your test. . Do not take any antihistamines 12 hours prior to your test. . DO NOT take metformin the day before your procedure   On the Day of the Test: . Drink plenty of water. Do not drink any water within one hour of the test. . Do not eat any food 4 hours prior to the test. . You may take your regular medications prior to the test.  . Take metoprolol (Lopressor) 100 MG two hours prior to test. . HOLD losartan-HCTZ morning of the test. . DO NOT take insulin the day of your procedure . DO Not take metformin the day of your procedure . DO NOT take farxiga the day of your procedure . FEMALES- please wear underwire-free bra if available      After the Test: . Drink plenty of water. . After receiving IV contrast, you may experience a mild flushed feeling. This is normal. . On occasion, you may experience a mild rash up to 24 hours after the test. This is not dangerous. If this occurs, you can take Benadryl 25 mg and increase your fluid intake. . If you experience trouble breathing, this can be serious. If it is severe call 911 IMMEDIATELY. If it is mild, please call our office. . DO NOT take metformin for 48 hours after your procedure   Once we have confirmed authorization from your insurance company, we will call you to set up a date and time for your  test. Based on how quickly your insurance processes prior authorizations requests, please allow up to 4 weeks to be contacted for scheduling your Cardiac CT appointment. Be advised that routine Cardiac CT appointments could be scheduled as many as 8 weeks after your provider has ordered it.  For non-scheduling related questions, please contact the cardiac imaging nurse navigator should you have any  questions/concerns: Marchia Bond, Cardiac Imaging Nurse Navigator Burley Saver, Interim Cardiac Imaging Nurse [place] and Vascular Services Direct Office Dial: [phone]   For scheduling needs, including cancellations and rescheduling, please call [place], [phone].       Signed, Werner Lean, MD  04/13/2020 3:17 PM    [place]

## 2020-04-13 NOTE — Patient Instructions (Addendum)
Medication Instructions:  Your physician recommends that you continue on your current medications as directed. Please refer to the Current Medication list given to you today.  *If you need a refill on your cardiac medications before your next appointment, please call your pharmacy*   Lab Work: None today  Your physician recommends that you return for a FASTING lipid profile and liver function panel in 2-3 months  Your physician recommends that you return for lab work before your Cardiac CT  If you have labs (blood work) drawn today and your tests are completely normal, you will receive your results only by: Marland Kitchen MyChart Message (if you have MyChart) OR . A paper copy in the mail If you have any lab test that is abnormal or we need to change your treatment, we will call you to review the results.   Testing/Procedures: Your physician has requested that you have cardiac CT.   Follow-Up: At [facility], you and your health needs are our priority.  As part of our continuing mission to provide you with exceptional heart care, we have created designated Provider Care Teams.  These Care Teams include your primary Cardiologist (physician) and Advanced Practice Providers (APPs -  Physician Assistants and Nurse Practitioners) who all work together to provide you with the care you need, when you need it.  We recommend signing up for the patient portal called "MyChart".  Sign up information is provided on this After Visit Summary.  MyChart is used to connect with patients for Virtual Visits (Telemedicine).  Patients are able to view lab/test results, encounter notes, upcoming appointments, etc.  Non-urgent messages can be sent to your provider as well.   To learn more about what you can do with MyChart, go to [email or web address].    Your next appointment:   2 month(s)  The format for your next appointment:   In Person  Provider:   Rudean Haskell, MD   Other Instructions Your  cardiac CT will be scheduled at one of the below locations:   [facility] [address] [place], [place] [address] [phone]  [place] [address] [place], [place] [address] [phone]  If scheduled at [facility], please arrive at the [facility] main entrance of [facility] 30 minutes prior to test start time. Proceed to the [facility] Radiology Department (first floor) to check-in and test prep.  If scheduled at [facility], please arrive 15 mins early for check-in and test prep.  Please follow these instructions carefully (unless otherwise directed):   On the Night Before the Test: . Be sure to Drink plenty of water. . Do not consume any caffeinated/decaffeinated beverages or chocolate 12 hours prior to your test. . Do not take any antihistamines 12 hours prior to your test. . DO NOT take metformin the day before your procedure   On the Day of the Test: . Drink plenty of water. Do not drink any water within one hour of the test. . Do not eat any food 4 hours prior to the test. . You may take your regular medications prior to the test.  . Take metoprolol (Lopressor) 100 MG two hours prior to test. . HOLD losartan-HCTZ morning of the test. . DO NOT take insulin the day of your procedure . DO Not take metformin the day of your procedure . DO NOT take farxiga the day of your procedure . FEMALES- please wear underwire-free bra  if available      After the Test: . Drink plenty of water. . After receiving IV contrast, you may experience a mild flushed feeling. This is normal. . On occasion, you may experience a mild rash up to 24 hours after the test. This is not dangerous. If this occurs, you can take Benadryl 25 mg and increase your fluid intake. . If you experience trouble breathing, this can be serious. If it is severe call 911 IMMEDIATELY. If it is mild,  please call our office. . DO NOT take metformin for 48 hours after your procedure   Once we have confirmed authorization from your insurance company, we will call you to set up a date and time for your test. Based on how quickly your insurance processes prior authorizations requests, please allow up to 4 weeks to be contacted for scheduling your Cardiac CT appointment. Be advised that routine Cardiac CT appointments could be scheduled as many as 8 weeks after your provider has ordered it.  For non-scheduling related questions, please contact the cardiac imaging nurse navigator should you have any questions/concerns: Marchia Bond, Cardiac Imaging Nurse Navigator Burley Saver, Interim Cardiac Imaging Nurse [place] and Vascular Services Direct Office Dial: [phone]   For scheduling needs, including cancellations and rescheduling, please call [place], [phone].

## 2020-04-27 ENCOUNTER — Other Ambulatory Visit: Payer: Self-pay

## 2020-04-27 ENCOUNTER — Ambulatory Visit: Payer: Managed Care, Other (non HMO) | Admitting: Family Medicine

## 2020-04-27 ENCOUNTER — Encounter: Payer: Self-pay | Admitting: Family Medicine

## 2020-04-27 VITALS — BP 118/62 | HR 93 | Ht 67.0 in | Wt 192.0 lb

## 2020-04-27 DIAGNOSIS — I209 Angina pectoris, unspecified: Secondary | ICD-10-CM

## 2020-04-27 DIAGNOSIS — L299 Pruritus, unspecified: Secondary | ICD-10-CM

## 2020-04-27 DIAGNOSIS — R5383 Other fatigue: Secondary | ICD-10-CM | POA: Diagnosis not present

## 2020-04-27 DIAGNOSIS — D518 Other vitamin B12 deficiency anemias: Secondary | ICD-10-CM

## 2020-04-27 LAB — POCT HEMOGLOBIN: Hemoglobin: 11.4 g/dL (ref 11–14.6)

## 2020-04-27 NOTE — Progress Notes (Signed)
     SUBJECTIVE:   CHIEF COMPLAINT / HPI:   Ear Issue: C/O Itchy ears B/L for a few weeks. Feels full, she denies pain, or ear discharge, no hearing loss. She has been using hairpin to scratch and remove ear wax. No other concerns.  Fatigue:C/O for > 1 month. She feels like she does not have any energy. She denies dizziness or falls. She endorses occasional headaches with no N/V or change in her vision. She is currently asymptomatic.  Chest pain:Denies any today. She is s/p cardiology evaluation and she stated she was scheduled for a CT scan.  PERTINENT  PMH / PSH: PMX reviewed  OBJECTIVE:   BP 118/62   Pulse 93   Ht 5\' 7"  (1.702 m)   Wt 192 lb (87.1 kg)   SpO2 98%   BMI 30.07 kg/m   Physical Exam Vitals and nursing note reviewed.  HENT:     Right Ear: Tympanic membrane normal. No drainage, swelling or tenderness.     Left Ear: Tympanic membrane and ear canal normal. No drainage, swelling or tenderness.     Ears:     Comments: + mildly red shallow ulcer, <0.57mm circumferentially on her right external auricular membrane. Otherwise, normal exam Cardiovascular:     Rate and Rhythm: Normal rate and regular rhythm.     Heart sounds: Normal heart sounds. No murmur heard.   Pulmonary:     Effort: Pulmonary effort is normal. No respiratory distress.     Breath sounds: Normal breath sounds. No wheezing or rhonchi.  Abdominal:     General: Abdomen is flat. Bowel sounds are normal. There is no distension.     Palpations: Abdomen is soft. There is no mass.     Tenderness: There is no abdominal tenderness.  Musculoskeletal:     Right lower leg: No edema.     Left lower leg: No edema.     Comments: Sensory exam of the foot is normal, tested with the monofilament. Good pulses, no lesions or ulcers, good peripheral pulses.   Psychiatric:        Mood and Affect: Mood normal.      ASSESSMENT/PLAN:   Itchy ears: B/L ?? Allergy related. Right ear canal ulceration is likely from  using sharp object to clean her ears.  I advised against this. Continue Levocetirizine daily and monitor for improvement. Will consider acetic acid once her ulcer has healed. F/U in 2 weeks for reassessment.   Fatigue: Etiology unclear ?? Stress related vs due to metabolic syndrome/endocrine disorder. She is complaint with DM regimen. Return in 2-4 weeks for A1C check while she continues her current DM regimen. Checked Vitamin B12, TSH and POC hemoglobin. Hemoglobin looks good, hence, less likely related to anemia. I will contact her soon with result. In the mean time, we will continue graded exercise which she is already doing.  Angina: Currently asymptomatic. Cardiology specialist's note reviewed during this encounter. Cardiac CT angiography recommended to exclude obstructive CAD. She mentioned she received a phone message from nuclear medicine in Nov, but she did not call back since she does not know who was calling. I advised her that they were likely calling to schedule her CTA. She will call this number back to schedule her appointment. Nitroglycerine as needed for chest pain. F/U as needed.  Kathy Mews, MD [place]

## 2020-04-27 NOTE — Patient Instructions (Signed)
It was nice seeing you today. Your itchy ears could be due to allergy.  Please avoid inserting sharp object in your ears. I will like to see you again in 1-2 weeks. Trial Zyrtec for the itching.

## 2020-04-28 ENCOUNTER — Telehealth: Payer: Self-pay | Admitting: Family Medicine

## 2020-04-28 LAB — VITAMIN B12: Vitamin B-12: 341 pg/mL (ref 232–1245)

## 2020-04-28 LAB — TSH: TSH: 1.07 u[IU]/mL (ref 0.450–4.500)

## 2020-04-28 NOTE — Telephone Encounter (Signed)
Test result discussed. Graded exercise recommended for fatigue. F/U soon.

## 2020-05-04 ENCOUNTER — Other Ambulatory Visit: Payer: Self-pay

## 2020-05-04 ENCOUNTER — Encounter (INDEPENDENT_AMBULATORY_CARE_PROVIDER_SITE_OTHER): Payer: Self-pay | Admitting: Ophthalmology

## 2020-05-04 ENCOUNTER — Ambulatory Visit (INDEPENDENT_AMBULATORY_CARE_PROVIDER_SITE_OTHER): Payer: Managed Care, Other (non HMO) | Admitting: Ophthalmology

## 2020-05-04 DIAGNOSIS — H43822 Vitreomacular adhesion, left eye: Secondary | ICD-10-CM

## 2020-05-04 DIAGNOSIS — H43821 Vitreomacular adhesion, right eye: Secondary | ICD-10-CM | POA: Diagnosis not present

## 2020-05-04 DIAGNOSIS — E103291 Type 1 diabetes mellitus with mild nonproliferative diabetic retinopathy without macular edema, right eye: Secondary | ICD-10-CM

## 2020-05-04 DIAGNOSIS — E103292 Type 1 diabetes mellitus with mild nonproliferative diabetic retinopathy without macular edema, left eye: Secondary | ICD-10-CM | POA: Diagnosis not present

## 2020-05-04 DIAGNOSIS — H2513 Age-related nuclear cataract, bilateral: Secondary | ICD-10-CM

## 2020-05-04 DIAGNOSIS — H3562 Retinal hemorrhage, left eye: Secondary | ICD-10-CM

## 2020-05-04 HISTORY — DX: Vitreomacular adhesion, right eye: H43.821

## 2020-05-04 HISTORY — DX: Vitreomacular adhesion, left eye: H43.822

## 2020-05-04 HISTORY — DX: Age-related nuclear cataract, bilateral: H25.13

## 2020-05-04 NOTE — Assessment & Plan Note (Signed)
No active progression of disease OD or OS, will continue to monitor observe

## 2020-05-04 NOTE — Progress Notes (Signed)
05/04/2020     CHIEF COMPLAINT Patient presents for Retina Follow Up (9 Month Diabetic F/U OU//Pt denies noticeable changes to [place] OU since last visit. Pt denies ocular pain, flashes of light, or floaters OU. //A1c: 8.0, 12/2019/LBS: 116 this AM)   HISTORY OF PRESENT ILLNESS: Kathy Duncan is a 64 y.o. female who presents to the clinic today for:   HPI    Retina Follow Up    Patient presents with  Diabetic Retinopathy.  In both eyes.  This started 9 months ago.  Severity is mild.  Duration of 9 months.  Since onset it is stable. Additional comments: 9 Month Diabetic F/U OU  Pt denies noticeable changes to [place] OU since last visit. Pt denies ocular pain, flashes of light, or floaters OU.   A1c: 8.0, 12/2019 LBS: 116 this AM       Last edited by Rockie Neighbours, Frederika on 05/04/2020  2:32 PM. (History)      Referring physician: Kinnie Feil, MD [place],  [place] [address]  HISTORICAL INFORMATION:   Selected notes from the MEDICAL RECORD NUMBER    Lab Results  Component Value Date   HGBA1C 8.2 (A) 02/06/2020     CURRENT MEDICATIONS: No current outpatient medications on file. (Ophthalmic Drugs)   No current facility-administered medications for this visit. (Ophthalmic Drugs)   Current Outpatient Medications (Other)  Medication Sig  . dapagliflozin propanediol (FARXIGA) 10 MG TABS tablet Take 1 tablet (10 mg total) by mouth daily.  . diphenhydrAMINE (BENADRYL) 25 MG tablet Take 1 tablet (25 mg total) by mouth every 8 (eight) hours as needed. (Patient not taking: Reported on 04/27/2020)  . ferrous sulfate 325 (65 FE) MG tablet Take 1 tablet (325 mg total) by mouth daily with breakfast. (Patient taking differently: Take 325 mg by mouth 2 (two) times daily with a meal.)  . glucose blood test strip Use to check CBG TID  . Insulin Glargine (BASAGLAR KWIKPEN) 100 UNIT/ML Inject 25 Units into the skin daily.  . Insulin Pen Needle (B-D ULTRAFINE III  SHORT PEN) 31G X 8 MM MISC Use to give insulin injections daily as instructed  . Insulin Syringe-Needle U-100 (B-D INS SYR ULTRAFINE 1CC/30G) 30G X 1/2" 1 ML MISC Use to check capillary glucose TID  . losartan-hydrochlorothiazide (HYZAAR) 100-25 MG tablet Take 1 tablet by mouth daily. (Patient taking differently: Take 0.5 tablets by mouth daily.)  . metFORMIN (GLUCOPHAGE) 1000 MG tablet TAKE 1 TABLET BY MOUTH TWO  TIMES DAILY WITH A MEAL  . metoprolol tartrate (LOPRESSOR) 100 MG tablet Take 1 tablet by mouth 2 hours prior to Cardiac CT  . nitroGLYCERIN (NITROSTAT) 0.4 MG SL tablet Place 1 tablet (0.4 mg total) under the tongue every 5 (five) minutes as needed for chest pain. (Patient not taking: Reported on 04/27/2020)  . OneTouch Delica Lancets 44Y MISC Use to check CBG TID  . rosuvastatin (CRESTOR) 20 MG tablet Take 1 tablet (20 mg total) by mouth daily.  Marland Kitchen triamcinolone ointment (KENALOG) 0.5 % Apply 1 application topically 2 (two) times daily. (Patient not taking: Reported on 04/27/2020)   No current facility-administered medications for this visit. (Other)      REVIEW OF SYSTEMS:    ALLERGIES Allergies  Allergen Reactions  . Ace Inhibitors Cough    PAST MEDICAL HISTORY Past Medical History:  Diagnosis Date  . Abnormal vaginal bleeding 07/29/2013  . Anemia 07/12/2006   09/12/11: Anemia panel not suggestive of iron or B12  deficiency.  CBC with diff shows no pancytopenia.  Colonoscopy 2008 and referral in 2010 for heme positive stools not suggestive of need for further evaluation.  Most likely due to tamoxifen.   . Arthritis   . Diabetes mellitus    type 2  . Ganglion cyst of wrist 10/14/2013  . Hypertension   . KNEE MENISCUS INJURY, UNSPECIFIED 07/12/2006   Qualifier: History of  By: Carmie End MD, Junie Panning     Past Surgical History:  Procedure Laterality Date  . ABDOMINAL HYSTERECTOMY     has ovaries  . BREAST EXCISIONAL BIOPSY Right May 2011  . TOTAL KNEE ARTHROPLASTY Right  03/01/2017   Procedure: RIGHT TOTAL KNEE ARTHROPLASTY;  Surgeon: Susa Day, MD;  Location: [facility];  Service: Orthopedics;  Laterality: Right;  120 mins with abductor block    FAMILY HISTORY Family History  Problem Relation Age of Onset  . Heart disease Mother   . Stroke Father   . Cancer Brother        Lung  . Heart disease Sister     SOCIAL HISTORY Social History   Tobacco Use  . Smoking status: Never Smoker  . Smokeless tobacco: Never Used  Vaping Use  . Vaping Use: Never used  Substance Use Topics  . Alcohol use: No  . Drug use: No         OPHTHALMIC EXAM:  Base Eye Exam    Visual Acuity (ETDRS)      Right Left   Dist [place] 20/40 +1 20/20 -1   Dist ph [place] 20/25 +2        Tonometry (Tonopen, 2:29 PM)      Right Left   Pressure 14 13       Pupils      Pupils Dark Light Shape React APD   Right PERRL 5 4 Round Brisk None   Left PERRL 5 4 Round Brisk None       Visual Fields (Counting fingers)      Left Right    Full Full       Extraocular Movement      Right Left    Full Full       Neuro/Psych    Oriented x3: Yes   Mood/Affect: Normal       Dilation    Both eyes: 1.0% Mydriacyl, 2.5% Phenylephrine @ 2:36 PM        Slit Lamp and Fundus Exam    External Exam      Right Left   External Normal Normal       Slit Lamp Exam      Right Left   Lids/Lashes Normal Normal   Conjunctiva/Sclera White and quiet White and quiet   Cornea Clear Clear   Anterior Chamber Deep and quiet Deep and quiet   Iris Round and reactive Round and reactive   Lens 2+ Nuclear sclerosis, 2+ Cortical cataract 2+ Nuclear sclerosis, 2+ Cortical cataract   Anterior Vitreous Normal Normal       Fundus Exam      Right Left   Posterior Vitreous Normal Normal   Disc Normal Normal   C/D Ratio 0.35 0.45   Macula Normal Normal   Vessels NPDR- Mild NPDR- Mild   Periphery Normal Normal          IMAGING AND PROCEDURES  Imaging and Procedures for 05/04/20  OCT,  Retina - OU - Both Eyes       Right Eye Quality was good. Scan locations included  subfoveal. Central Foveal Thickness: 313. Progression has been stable. Findings include vitreomacular adhesion , normal foveal contour.   Left Eye Quality was good. Scan locations included subfoveal. Central Foveal Thickness: 299. Progression has been stable. Findings include vitreomacular adhesion , normal foveal contour.   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 The patient was reassured that surgery at a later date does not put the patient at risk for a worse outcome. It was emphasized that the need for surgery is dictated by the patient's quality of life as influenced by the cataract. Patient was instructed to maintain close follow up with their general eye care doctor.      ICD-10-CM   1. Controlled type 1 diabetes mellitus with left eye affected by mild nonproliferative retinopathy without macular edema (HCC)  E10.3292 OCT, Retina - OU - Both Eyes  2. Retinal hemorrhage of left eye  H35.62   3. Vitreomacular adhesion of left eye  H43.822 OCT, Retina - OU - Both Eyes  4. Vitreomacular adhesion of right eye  H43.821 OCT, Retina - OU - Both Eyes  5. Mild nonproliferative diabetic retinopathy of right eye without macular edema associated with type 1 diabetes mellitus (HCC)  E10.3291 OCT, Retina - OU - Both Eyes  6. Nuclear sclerotic cataract of both eyes  H25.13     1.  No ongoing symptoms from the cataract opacities I will explained the patient that nighttime visual difficulties or visualizations during dark rainy days can be hampered by the cataracts which  in her eyes are like wearing dark yellow-green sunglasses that can never be taken off.  2.  Understands critical ports of continued blood sugar control monitoring his best chance to prevent further progression of diabetic eye disease.  3.  Ophthalmic Meds Ordered this visit:  No orders of the defined types were placed in this encounter.      Return in about 9 months (around 02/02/2021) for DILATE OU, COLOR FP, OCT.  There are no Patient Instructions on file for this visit.   Explained the diagnoses, plan, and follow up with the patient and they expressed understanding.  Patient expressed understanding of the importance of proper follow up care.   Clent Demark Candi Profit M.D. Diseases & Surgery of the Retina and Vitreous [facility] [place] 05/04/20     Abbreviations: M myopia (nearsighted); A astigmatism; H hyperopia (farsighted); P presbyopia; Mrx spectacle prescription;  CTL contact lenses; OD right eye; OS left eye; OU both eyes  XT exotropia; ET esotropia; PEK punctate epithelial keratitis; PEE punctate epithelial erosions; DES dry eye syndrome; MGD meibomian gland dysfunction; ATs artificial tears; PFAT's preservative free artificial tears; [place] nuclear sclerotic cataract; PSC posterior subcapsular cataract; ERM epi-retinal membrane; PVD posterior vitreous detachment; RD retinal detachment; DM diabetes mellitus; DR diabetic retinopathy; NPDR non-proliferative diabetic retinopathy; PDR proliferative diabetic retinopathy; CSME clinically significant macular edema; DME diabetic macular edema; dbh dot blot hemorrhages; CWS cotton wool spot; POAG primary open angle glaucoma; C/D cup-to-disc ratio; HVF humphrey visual field; GVF goldmann visual field; OCT optical coherence tomography; IOP intraocular pressure; BRVO Branch retinal vein occlusion; CRVO central  retinal vein occlusion; CRAO central retinal artery occlusion; BRAO branch retinal artery occlusion; RT retinal tear; SB scleral  buckle; PPV pars plana vitrectomy; VH Vitreous hemorrhage; PRP panretinal laser photocoagulation; IVK intravitreal kenalog; VMT vitreomacular traction; MH Macular hole;  NVD neovascularization of the disc; NVE neovascularization elsewhere; AREDS age related eye disease study; ARMD age related macular degeneration; POAG primary open angle glaucoma; EBMD epithelial/anterior basement membrane dystrophy; ACIOL anterior chamber intraocular lens; IOL intraocular lens; PCIOL posterior chamber intraocular lens; Phaco/IOL phacoemulsification with intraocular lens placement; [place] photorefractive keratectomy; LASIK laser assisted in situ keratomileusis; HTN hypertension; DM diabetes mellitus; COPD chronic obstructive pulmonary disease

## 2020-05-04 NOTE — Assessment & Plan Note (Signed)

## 2020-05-21 ENCOUNTER — Other Ambulatory Visit: Payer: Self-pay | Admitting: Family Medicine

## 2020-05-25 ENCOUNTER — Encounter: Payer: Self-pay | Admitting: Family Medicine

## 2020-05-25 ENCOUNTER — Other Ambulatory Visit: Payer: Managed Care, Other (non HMO) | Admitting: *Deleted

## 2020-05-25 ENCOUNTER — Other Ambulatory Visit: Payer: Self-pay

## 2020-05-25 ENCOUNTER — Ambulatory Visit: Payer: Managed Care, Other (non HMO) | Admitting: Family Medicine

## 2020-05-25 ENCOUNTER — Telehealth (HOSPITAL_COMMUNITY): Payer: Self-pay | Admitting: Emergency Medicine

## 2020-05-25 VITALS — BP 124/60 | HR 69 | Ht 67.0 in | Wt 190.0 lb

## 2020-05-25 DIAGNOSIS — R5383 Other fatigue: Secondary | ICD-10-CM

## 2020-05-25 DIAGNOSIS — I209 Angina pectoris, unspecified: Secondary | ICD-10-CM

## 2020-05-25 DIAGNOSIS — L299 Pruritus, unspecified: Secondary | ICD-10-CM | POA: Diagnosis not present

## 2020-05-25 DIAGNOSIS — I10 Essential (primary) hypertension: Secondary | ICD-10-CM

## 2020-05-25 DIAGNOSIS — E103292 Type 1 diabetes mellitus with mild nonproliferative diabetic retinopathy without macular edema, left eye: Secondary | ICD-10-CM | POA: Diagnosis not present

## 2020-05-25 DIAGNOSIS — E785 Hyperlipidemia, unspecified: Secondary | ICD-10-CM

## 2020-05-25 DIAGNOSIS — E1169 Type 2 diabetes mellitus with other specified complication: Secondary | ICD-10-CM | POA: Diagnosis not present

## 2020-05-25 DIAGNOSIS — E119 Type 2 diabetes mellitus without complications: Secondary | ICD-10-CM

## 2020-05-25 DIAGNOSIS — Z794 Long term (current) use of insulin: Secondary | ICD-10-CM

## 2020-05-25 DIAGNOSIS — R072 Precordial pain: Secondary | ICD-10-CM

## 2020-05-25 LAB — BASIC METABOLIC PANEL
BUN/Creatinine Ratio: 27 (ref 12–28)
BUN: 26 mg/dL (ref 8–27)
CO2: 25 mmol/L (ref 20–29)
Calcium: 9.6 mg/dL (ref 8.7–10.3)
Chloride: 101 mmol/L (ref 96–106)
Creatinine, Ser: 0.96 mg/dL (ref 0.57–1.00)
GFR calc Af Amer: 72 mL/min/{1.73_m2} (ref >59)
GFR calc non Af Amer: 63 mL/min/{1.73_m2} (ref >59)
Glucose: 95 mg/dL (ref 65–99)
Potassium: 4 mmol/L (ref 3.5–5.2)
Sodium: 142 mmol/L (ref 134–144)

## 2020-05-25 LAB — POCT GLYCOSYLATED HEMOGLOBIN (HGB A1C): Hemoglobin A1C: 8.5 % — AB (ref 4.0–5.6)

## 2020-05-25 LAB — HEPATIC FUNCTION PANEL
ALT: 10 IU/L (ref 0–32)
AST: 15 IU/L (ref 0–40)
Albumin: 4.4 g/dL (ref 3.8–4.8)
Alkaline Phosphatase: 100 IU/L (ref 44–121)
Bilirubin Total: 0.3 mg/dL (ref 0.0–1.2)
Bilirubin, Direct: 0.1 mg/dL (ref 0.00–0.40)
Total Protein: 7.2 g/dL (ref 6.0–8.5)

## 2020-05-25 LAB — LIPID PANEL
Chol/HDL Ratio: 2.1 ratio (ref 0.0–4.4)
Cholesterol, Total: 130 mg/dL (ref 100–199)
HDL: 63 mg/dL (ref >39)
LDL Chol Calc (NIH): 55 mg/dL (ref 0–99)
Triglycerides: 57 mg/dL (ref 0–149)
VLDL Cholesterol Cal: 12 mg/dL (ref 5–40)

## 2020-05-25 MED ORDER — ACETIC ACID 2 % OT SOLN: 4.0000 [drp] | Freq: Three times a day (TID) | OTIC | 0 refills | Status: AC

## 2020-05-25 NOTE — Patient Instructions (Signed)
It was nice seeing you today. I sent in an ear drops to [facility]. Please use for 7 days. Let me know if there is no improvement.  Also, your A1C increased. Please improve on diet and exercise and we will recheck in 3 months. Continue the current dose of your insulin, Metformin and Farxiga.

## 2020-05-25 NOTE — Assessment & Plan Note (Signed)
She is compliant with her meds. Poor adherence to diet plan in the previous 2 -3 weeks, but has improved since then. She plan on working on her diet with current DM regimen which is appropriate. Repeat A1C in 3 months. In the meantime, she will continue to monitor her CBG closely at home

## 2020-05-25 NOTE — Telephone Encounter (Signed)
Reaching out to patient to offer assistance regarding upcoming cardiac imaging study; pt verbalizes understanding of appt date/time, parking situation and where to check in, pre-test NPO status and medications ordered, and verified current allergies; name and call back number provided for further questions should they arise Marchia Bond RN Navigator Cardiac Imaging [facility] [phone] office [phone] cell  Hold hyzaar, take 100 mg metop 2 hr PTA

## 2020-05-25 NOTE — Progress Notes (Signed)
    SUBJECTIVE:   CHIEF COMPLAINT / HPI:   Itchy ears:Improved a bit since her last visit. She denies any pain or hearing loss. No ear discharge.  DM2:Here for follow-up. She stated that she had poor diet from the beginning of Thanksgiving up till the New Year and she worry that her A1C will be worse. Her home CBG has been in the mid 100s, highest of 168 in the past few days. She is compliant with Farxiga 10 mg, Basaglar 25 units, Metformin 1000 mg BID. She has no other concerns.  Fatigue; Resolved since her last visit.  PERTINENT  PMH / PSH: PMX reviewed.  OBJECTIVE:   BP 124/60   Pulse 69   Ht 5\' 7"  (1.702 m)   Wt 190 lb (86.2 kg)   SpO2 97%   BMI 29.76 kg/m   Physical Exam Vitals and nursing note reviewed.  HENT:     Right Ear: Tympanic membrane, ear canal and external ear normal. There is no impacted cerumen.     Left Ear: Tympanic membrane, ear canal and external ear normal. There is no impacted cerumen.  Cardiovascular:     Rate and Rhythm: Normal rate and regular rhythm.     Heart sounds: Normal heart sounds. No murmur heard.   Pulmonary:     Effort: Pulmonary effort is normal. No respiratory distress.     Breath sounds: Normal breath sounds. No wheezing or rhonchi.  Abdominal:     General: Abdomen is flat. Bowel sounds are normal. There is no distension.     Palpations: Abdomen is soft. There is no mass.     Tenderness: There is no abdominal tenderness.  Musculoskeletal:     Right lower leg: No edema.     Left lower leg: No edema.      ASSESSMENT/PLAN:   DM (diabetes mellitus), type 2 ([place]) She is compliant with her meds. Poor adherence to diet plan in the previous 2 -3 weeks, but has improved since then. She plan on working on her diet with current DM regimen which is appropriate. Repeat A1C in 3 months. In the meantime, she will continue to monitor her CBG closely at home   Itchy ears.  Normal exam. ?? Due to xerosis vs fungal infection, although  less likely. Trial of acetic acid otic. Med escribed. F/U soon if there is no improvement.  Fatigue; Resolved.  Andrena Mews, MD [place]

## 2020-05-27 ENCOUNTER — Ambulatory Visit (HOSPITAL_COMMUNITY)
Admission: RE | Admit: 2020-05-27 | Discharge: 2020-05-27 | Disposition: A | Discharge status: Home / Self Care | Payer: Managed Care, Other (non HMO) | Source: Ambulatory Visit | Attending: Internal Medicine | Admitting: Internal Medicine

## 2020-05-27 ENCOUNTER — Ambulatory Visit (HOSPITAL_COMMUNITY): Payer: Managed Care, Other (non HMO)

## 2020-05-27 ENCOUNTER — Other Ambulatory Visit: Payer: Self-pay

## 2020-05-27 DIAGNOSIS — R072 Precordial pain: Secondary | ICD-10-CM

## 2020-05-27 IMAGING — CT CT HEART MORP W/ CTA COR W/ SCORE W/ CA W/CM &/OR W/O CM
1 of 3 series · 6 of 20 positions shown, 8 images · non-contrast
Comparison: No relevant comparison
COMPARISON: No relevant comparison

Addendum:
EXAM:
OVER-READ INTERPRETATION  CT CHEST

The following report is an over-read performed by radiologist Dr.
over-read does not include interpretation of cardiac or coronary
anatomy or pathology. The coronary calcium score/coronary CTA
interpretation by the cardiologist is attached.
CLINICAL DATA: 64 Year-old African American Female
Cardiac/Coronary  CTA
TECHNIQUE: The patient was scanned on a Phillips Force scanner.

[Series 704: — · 0.42mm/px · 6 of 8 slices shown, 8 images]
[im 2/8  vessel]
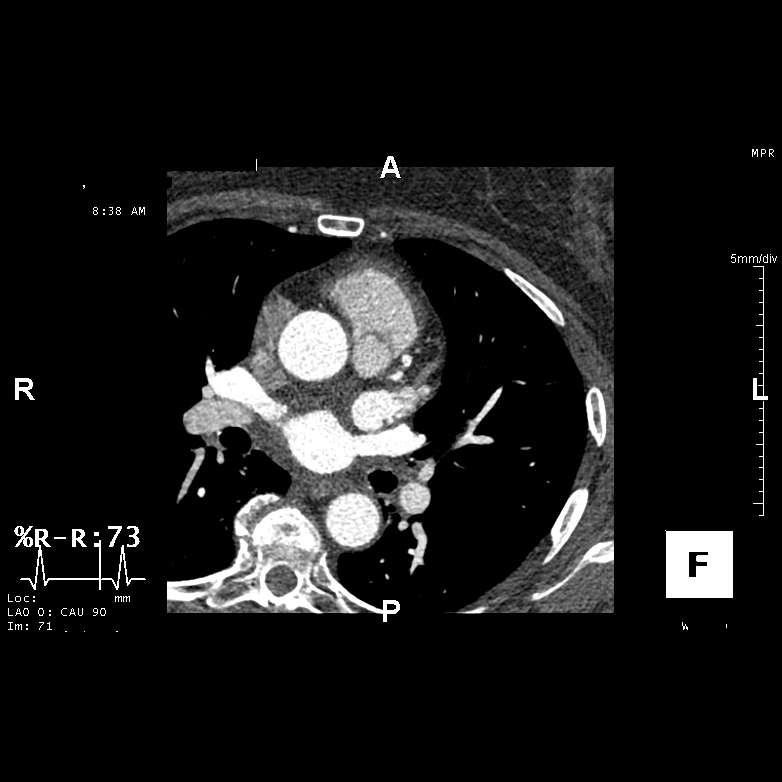
[im 2/8  lung]
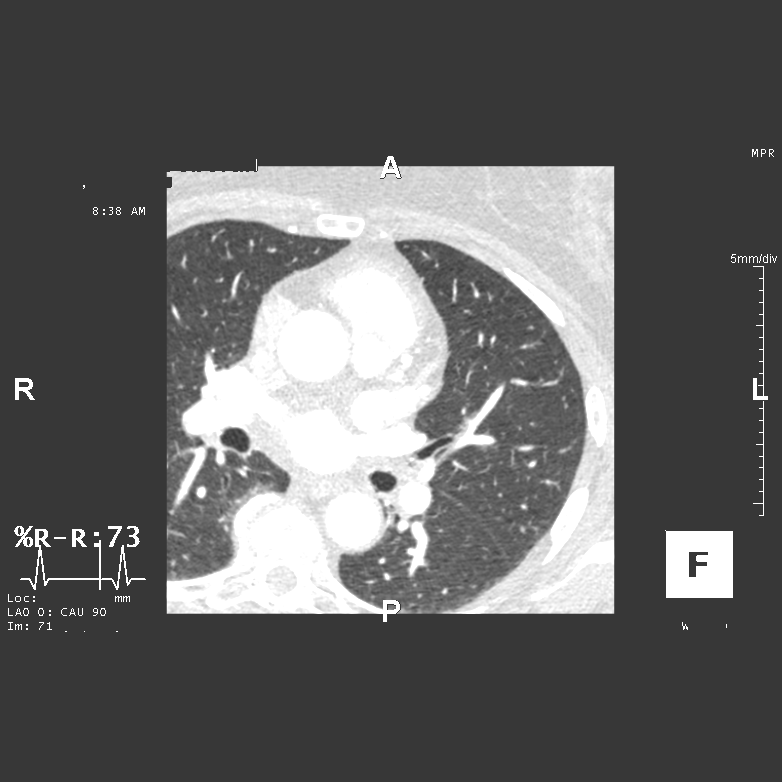
[im 3/8  vessel]
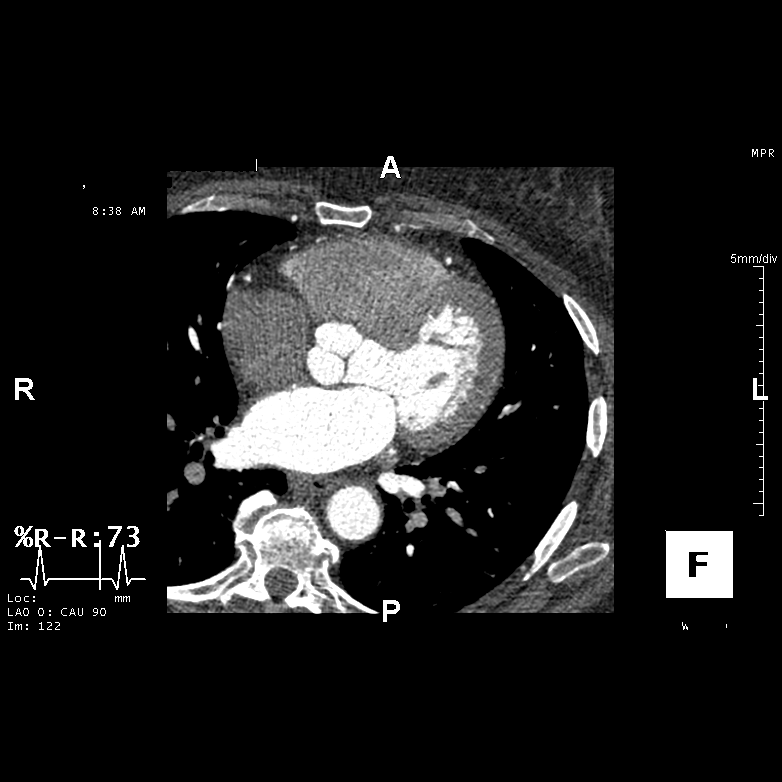
[im 4/8  vessel]
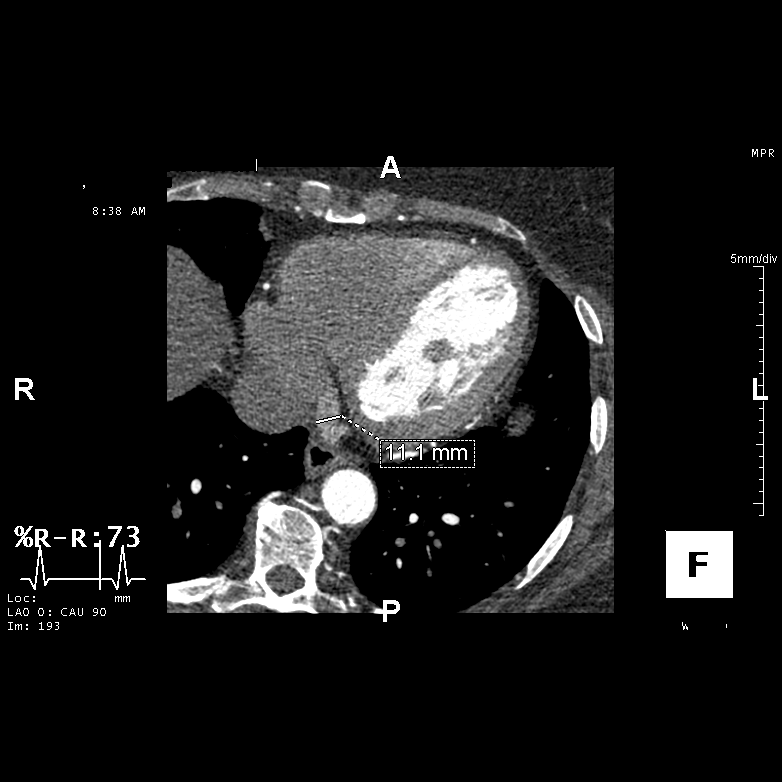
[im 5/8  vessel]
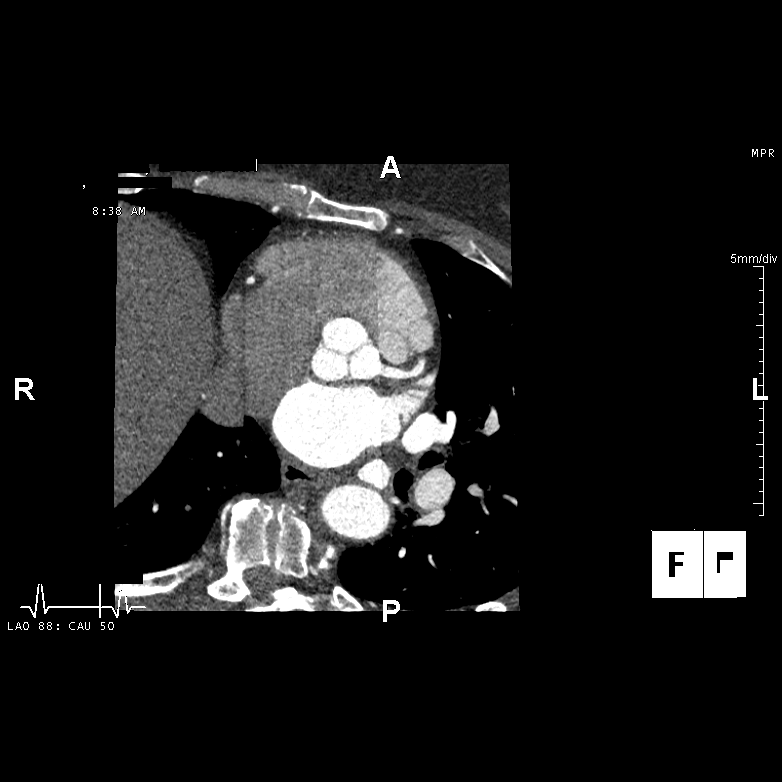
[im 6/8  vessel]
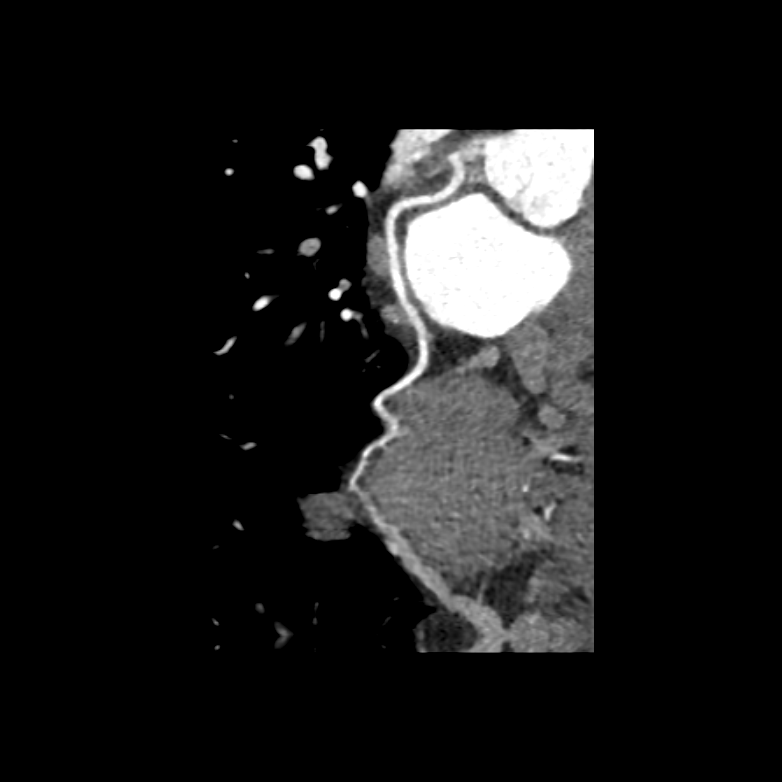
[im 6/8  lung]
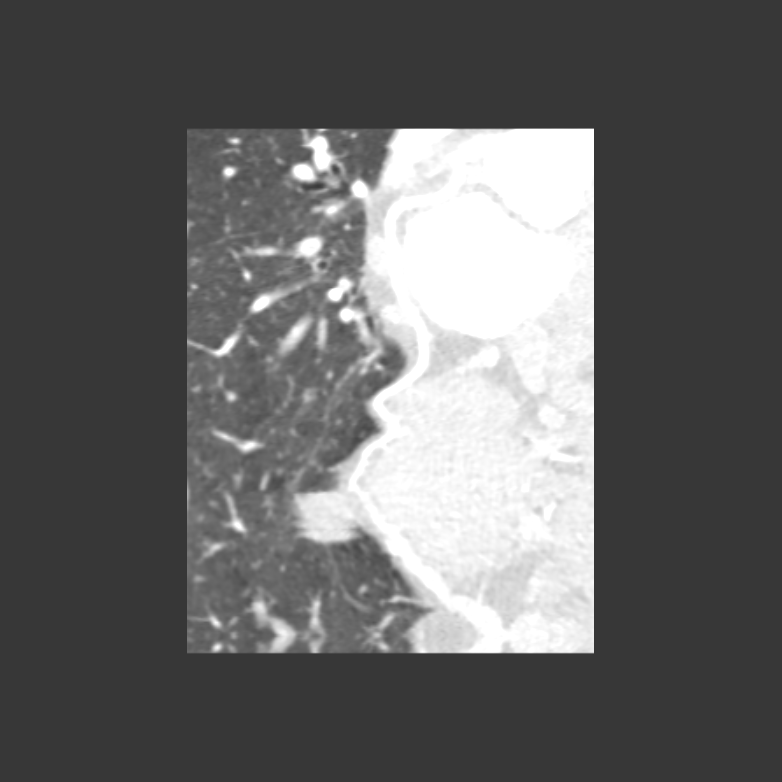
[im 7/8  vessel]
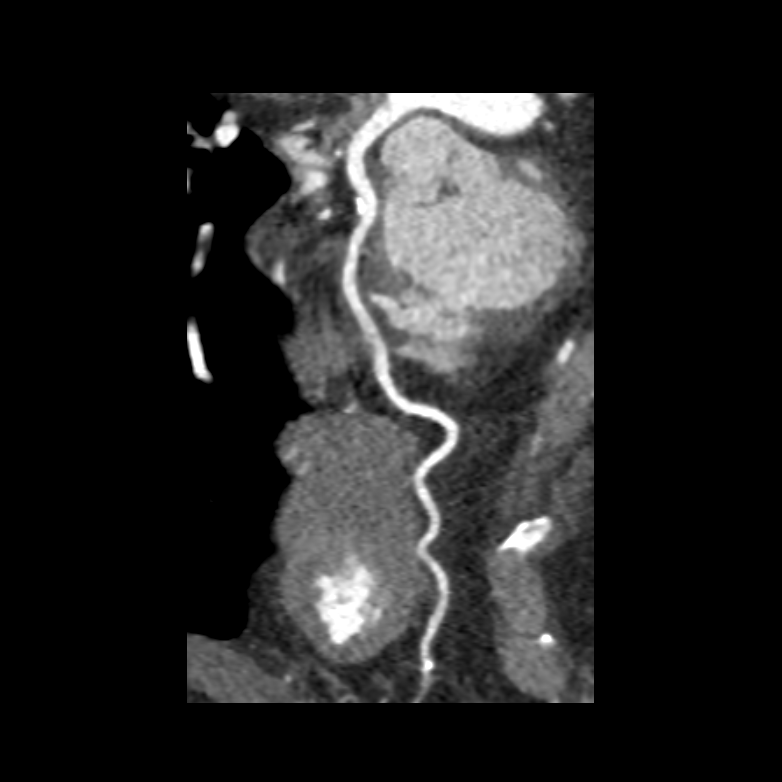

[6 of 20 positions shown; findings below may reference images not displayed]

FINDINGS: Vascular: Minimal soft plaque in the descending thoracic aorta. No
calcified plaque. Normal caliber of the thoracic aorta. See
dedicated report regarding cardiac findings.

Mediastinum/Nodes: No mediastinal adenopathy to the extent
evaluated.

Lungs/Pleura: Visualized airways are patent. No consolidation or
pleural effusion.

Upper Abdomen: Normal appearance of the upper abdominal contents
which are evaluated in a very limited fashion.

Musculoskeletal: No acute bone finding. No destructive bone process.
IMPRESSION: Minimal soft plaque in the descending thoracic aorta.

Otherwise no significant extracardiac findings.
FINDINGS: A 100 kV prospective scan was triggered in the descending thoracic
aorta at 111 HU's. Axial non-contrast 3 mm slices were carried out
through the heart. The data set was analyzed on a dedicated work
station and scored using the Agatson method. Gantry rotation speed
was 250 msecs and collimation was .6 mm. No beta blockade and 0.8 mg
of sl NTG was given. The 3D data set was reconstructed in 5%
intervals of the 67-82 % of the R-R cycle. Diastolic phases were
analyzed on a dedicated work station using MPR, MIP and VRT modes.
The patient received 80 cc of contrast.

Aorta:  Normal size.  Aortic atherosclerosis noted.  No dissection.

Aortic Valve:  Tri-leaflet.  No calcifications.

Coronary Arteries:  Normal coronary origin.  Right dominance.

Coronary calcium score of 43.7. This was 80th percentile for age,
sex, and race matched control.

RCA is a large dominant artery that gives rise to PDA and PLA. There
is minimal (1-24%) non-obstructive calcified plaque in the proximal
vessel, a minimal narrowing (1-24%) in the mid vessel, and a minimal
(1-24%) non-obstructive calcified plaque in the distal vessel.

Left main is a large artery that gives rise to LAD and LCX arteries.
There is no plaque.

LAD is a large vessel that gives rise to a large D1 and a D2 branch.
There is a mild (25-49%) non-obstructive calcified plaque in the
proximal vessel and a minimal (1-24%) non-obstructive calcified
plaque in the distal vessel. There is a minimal (1-24%)
non-obstructive calcified plaque in the ostium of the D1 branch.

LCX is a non-dominant artery that gives rise to one large OM1
branch. There is a minimal (1-24%) non-obstructive soft plaque in
the distal vessel.

Other findings:

Normal pulmonary vein drainage into the left atrium.

Normal left atrial appendage without a thrombus.

Normal size of the pulmonary artery.

Extra-cardiac findings: See attached radiology report for
non-cardiac structures.
IMPRESSION: 1. Coronary calcium score of 43.7. This was 80th percentile for age,
sex, and race matched control.

2. Normal coronary origin with right dominance.

3. CAD-RADS 2. Mild non-obstructive CAD (25-49%). Consider
non-atherosclerotic causes of chest pain. Consider preventive
therapy and risk factor modification.

4. Aortic atherosclerosis noted.

*** End of Addendum ***
EXAM:
OVER-READ INTERPRETATION  CT CHEST

The following report is an over-read performed by radiologist Dr.
over-read does not include interpretation of cardiac or coronary
anatomy or pathology. The coronary calcium score/coronary CTA
interpretation by the cardiologist is attached.
FINDINGS: Vascular: Minimal soft plaque in the descending thoracic aorta. No
calcified plaque. Normal caliber of the thoracic aorta. See
dedicated report regarding cardiac findings.

Mediastinum/Nodes: No mediastinal adenopathy to the extent
evaluated.

Lungs/Pleura: Visualized airways are patent. No consolidation or
pleural effusion.

Upper Abdomen: Normal appearance of the upper abdominal contents
which are evaluated in a very limited fashion.

Musculoskeletal: No acute bone finding. No destructive bone process.
IMPRESSION: Minimal soft plaque in the descending thoracic aorta.

Otherwise no significant extracardiac findings.

## 2020-05-27 MED ORDER — IOHEXOL 350 MG/ML SOLN
80.0000 mL | Freq: Once | INTRAVENOUS | Status: AC | PRN
  Administered 2020-05-27: 80 mL via INTRAVENOUS

## 2020-05-27 MED ORDER — NITROGLYCERIN 0.4 MG SL SUBL: 0.8000 mg | SUBLINGUAL_TABLET | Freq: Once | SUBLINGUAL | Status: AC

## 2020-05-27 MED ORDER — NITROGLYCERIN 0.4 MG SL SUBL
SUBLINGUAL_TABLET | SUBLINGUAL | Status: AC
  Administered 2020-05-27: 0.8 mg via SUBLINGUAL
  Filled 2020-05-27: qty 2

## 2020-05-28 ENCOUNTER — Telehealth: Payer: Self-pay | Admitting: Internal Medicine

## 2020-05-28 NOTE — Telephone Encounter (Signed)
-----   Message from Werner Lean, MD sent at 05/27/2020  2:11 PM EST ----- Results: Non-obstructive coronary artery disease Plan: At next visit will work through risk factor reduction  Werner Lean, MD

## 2020-05-28 NOTE — Telephone Encounter (Signed)
The patient has been notified of the result and verbalized understanding.  All questions (if any) were answered. Wilma Flavin, RN 05/28/2020 4:17 PM

## 2020-05-28 NOTE — Telephone Encounter (Signed)
Attempted to return patients call regarding results, no answer. Left message for her to return call to office.

## 2020-05-28 NOTE — Telephone Encounter (Signed)
Kathy Duncan is returning Kathy Duncan's call in regards to her CT results. Please advise.

## 2020-06-11 ENCOUNTER — Other Ambulatory Visit: Payer: Managed Care, Other (non HMO)

## 2020-06-17 ENCOUNTER — Ambulatory Visit: Payer: Managed Care, Other (non HMO) | Admitting: Internal Medicine

## 2020-06-17 ENCOUNTER — Encounter: Payer: Self-pay | Admitting: Internal Medicine

## 2020-06-17 ENCOUNTER — Other Ambulatory Visit: Payer: Self-pay

## 2020-06-17 ENCOUNTER — Encounter: Payer: Self-pay | Admitting: Obstetrics & Gynecology

## 2020-06-17 ENCOUNTER — Ambulatory Visit: Payer: Managed Care, Other (non HMO) | Admitting: Obstetrics & Gynecology

## 2020-06-17 ENCOUNTER — Ambulatory Visit: Payer: Managed Care, Other (non HMO) | Admitting: Obstetrics and Gynecology

## 2020-06-17 VITALS — BP 122/80 | Ht 67.0 in | Wt 190.0 lb

## 2020-06-17 VITALS — BP 120/68 | HR 97 | Ht 67.0 in | Wt 189.0 lb

## 2020-06-17 DIAGNOSIS — R350 Frequency of micturition: Secondary | ICD-10-CM | POA: Diagnosis not present

## 2020-06-17 DIAGNOSIS — L292 Pruritus vulvae: Secondary | ICD-10-CM | POA: Diagnosis not present

## 2020-06-17 DIAGNOSIS — N898 Other specified noninflammatory disorders of vagina: Secondary | ICD-10-CM

## 2020-06-17 DIAGNOSIS — I10 Essential (primary) hypertension: Secondary | ICD-10-CM

## 2020-06-17 DIAGNOSIS — I2510 Atherosclerotic heart disease of native coronary artery without angina pectoris: Secondary | ICD-10-CM | POA: Diagnosis not present

## 2020-06-17 DIAGNOSIS — E782 Mixed hyperlipidemia: Secondary | ICD-10-CM

## 2020-06-17 DIAGNOSIS — I700 Atherosclerosis of aorta: Secondary | ICD-10-CM

## 2020-06-17 DIAGNOSIS — E119 Type 2 diabetes mellitus without complications: Secondary | ICD-10-CM

## 2020-06-17 DIAGNOSIS — I2584 Coronary atherosclerosis due to calcified coronary lesion: Secondary | ICD-10-CM

## 2020-06-17 LAB — URINALYSIS, COMPLETE W/RFL CULTURE
Bacteria, UA: NONE SEEN /HPF
Bilirubin Urine: NEGATIVE
Hgb urine dipstick: NEGATIVE
Hyaline Cast: NONE SEEN /LPF
Ketones, ur: NEGATIVE
Leukocyte Esterase: NEGATIVE
Nitrites, Initial: NEGATIVE
Protein, ur: NEGATIVE
RBC / HPF: NONE SEEN /HPF (ref 0–2)
Specific Gravity, Urine: 1.01 (ref 1.001–1.03)
pH: 7 (ref 5.0–8.0)

## 2020-06-17 LAB — WET PREP FOR TRICH, YEAST, CLUE

## 2020-06-17 MED ORDER — CLOBETASOL PROPIONATE 0.05 % EX OINT: 1.0000 "application " | TOPICAL_OINTMENT | Freq: Every day | CUTANEOUS | 2 refills | Status: AC

## 2020-06-17 MED ORDER — FLUCONAZOLE 150 MG PO TABS: 150.0000 mg | ORAL_TABLET | Freq: Every day | ORAL | 2 refills | Status: AC

## 2020-06-17 NOTE — Patient Instructions (Signed)
Medication Instructions:  Your physician recommends that you continue on your current medications as directed. Please refer to the Current Medication list given to you today.  *If you need a refill on your cardiac medications before your next appointment, please call your pharmacy*   Lab Work: NONE If you have labs (blood work) drawn today and your tests are completely normal, you will receive your results only by: MyChart Message (if you have MyChart) OR A paper copy in the mail If you have any lab test that is abnormal or we need to change your treatment, we will call you to review the results.   Testing/Procedures: NONE   Follow-Up: At [facility], you and your health needs are our priority.  As part of our continuing mission to provide you with exceptional heart care, we have created designated Provider Care Teams.  These Care Teams include your primary Cardiologist (physician) and Advanced Practice Providers (APPs -  Physician Assistants and Nurse Practitioners) who all work together to provide you with the care you need, when you need it.   Your next appointment:   6 month(s)  The format for your next appointment:   In Person  Provider:   You may see Mahesh A Chandrasekhar, MD or one of the following Advanced Practice Providers on your designated Care Team:   Dayna Dunn, PA-C Michele Lenze, PA-C      

## 2020-06-17 NOTE — Progress Notes (Signed)
Cardiology Office Note:    Date:  06/17/2020   ID:  Kathy Duncan, DOB 1955/12/14, MRN [id number]  PCP:  Kathy Feil, MD  [facility] Cardiologist:  Werner Lean, MD  [facility] Electrophysiologist:  None   CC: Follow up CT Scan  History of Present Illness:    Kathy Duncan is a 65 y.o. female with a hx of Diabetes with Hypertension, HLD, who presents for evaluation 04/13/20.  In interval had CCTA with mild non-obstructive disease.    Patient notes that she is doing much better.  Since last visit notes no changes.  Relevant interval testing or therapy include CCTA.  There are no interval hospital/ED visit.    Her company hired someone to push boxes and with that her pain has resolved. No chest pain or pressure .  No SOB/DOE and no PND/Orthopnea.  No weight gain or leg swelling.  No palpitations or syncope .  Ambulatory blood pressure SBP 106-122.   Past Medical History:  Diagnosis Date  . Abnormal vaginal bleeding 07/29/2013  . Anemia 07/12/2006   09/12/11: Anemia panel not suggestive of iron or B12 deficiency.  CBC with diff shows no pancytopenia.  Colonoscopy 2008 and referral in 2010 for heme positive stools not suggestive of need for further evaluation.  Most likely due to tamoxifen.   . Arthritis   . Diabetes mellitus    type 2  . Ganglion cyst of wrist 10/14/2013  . Hypertension   . KNEE MENISCUS INJURY, UNSPECIFIED 07/12/2006   Qualifier: History of  By: Carmie End MD, Junie Panning      Past Surgical History:  Procedure Laterality Date  . ABDOMINAL HYSTERECTOMY     has ovaries  . BREAST EXCISIONAL BIOPSY Right May 2011  . 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 clobetasol ointment (TEMOVATE) 2.95 % Apply 1 application topically  daily for 14 days.  . dapagliflozin propanediol (FARXIGA) 10 MG TABS tablet Take 1 tablet (10 mg total) by mouth daily.  . diphenhydrAMINE (BENADRYL) 25 MG tablet Take 1 tablet (25 mg total) by mouth every 8 (eight) hours as needed.  . ferrous sulfate 325 (65 FE) MG tablet Take 1 tablet (325 mg total) by mouth daily with breakfast.  . fluconazole (DIFLUCAN) 150 MG tablet Take 1 tablet (150 mg total) by mouth daily for 3 days.  Marland Kitchen glucose blood test strip Use to check CBG TID  . Insulin Glargine (BASAGLAR KWIKPEN) 100 UNIT/ML Inject 25 Units into the skin daily.  . Insulin Pen Needle (B-D ULTRAFINE III SHORT PEN) 31G X 8 MM MISC Use to check capillary glucose TID  . Insulin Syringe-Needle U-100 (B-D INS SYR ULTRAFINE 1CC/30G) 30G X 1/2" 1 ML MISC Use to check capillary glucose TID  . losartan-hydrochlorothiazide (HYZAAR) 100-25 MG tablet Take 1 tablet by mouth daily.  . metFORMIN (GLUCOPHAGE) 1000 MG tablet TAKE 1 TABLET BY MOUTH TWO  TIMES DAILY WITH A MEAL  . metoprolol tartrate (LOPRESSOR) 100 MG tablet Take 1 tablet by mouth 2 hours prior to Cardiac CT  . nitroGLYCERIN (NITROSTAT) 0.4 MG SL tablet Place 1 tablet (0.4 mg total) under the tongue every 5 (five) minutes as needed for chest pain.  Glory Rosebush Delica Lancets 28U MISC Use to check CBG TID  . rosuvastatin (CRESTOR)  20 MG tablet Take 1 tablet (20 mg total) by mouth daily.     Allergies:   Ace inhibitors   Social History   Socioeconomic History  . Marital status: Married    Spouse name: Not on file  . Number of children: Not on file  . Years of education: Not on file  . Highest education level: Not on file  Occupational History  . Not on file  Tobacco Use  . Smoking status: Never Smoker  . Smokeless tobacco: Never Used  Vaping Use  . Vaping Use: Never used  Substance and Sexual Activity  . Alcohol use: Yes  . Drug use: No  . Sexual activity: Not Currently    Comment: 1st intercourse 65 yo-Fewer than 5 partners  Other  Topics Concern  . Not on file  Social History Narrative   Works at [employer], does some heavy lifting.  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   Social Determinants of Health   Financial Resource Strain: Not on file  Food Insecurity: Not on file  Transportation Needs: Not on file  Physical Activity: Not on file  Stress: Not on file  Social Connections: Not on file    Family History: The patient's family history includes Cancer in her brother; Heart disease in her mother and sister; Stroke in her father.  Mother- CABG at age 65 Sister - CABG at age 86  ROS:   Please see the history of present illness.    All other systems reviewed and are negative.  EKGs/Labs/Other Studies Reviewed:    The following studies were reviewed today:  EKG:   03/25/20:  SR rate 63, no ST/T changes  Cardiac CT: Date: 05/27/2020 Results: IMPRESSION: 1. Coronary calcium score of 43.7. This was 80th percentile for age, sex, and race matched control.  2. Normal coronary origin with right dominance.  3. CAD-RADS 2. Mild non-obstructive CAD (25-49%). Consider non-atherosclerotic causes of chest pain. Consider preventive therapy and risk factor modification.  4. Aortic atherosclerosis noted.   Recent Labs: 03/25/2020: Platelets 310 04/27/2020: Hemoglobin 11.4; TSH 1.070 05/25/2020: ALT 10; BUN 26; Creatinine, Ser 0.96; Potassium 4.0; Sodium 142  Recent Lipid Panel    Component Value Date/Time   CHOL 130 05/25/2020 0736   TRIG 57 05/25/2020 0736   HDL 63 05/25/2020 0736   CHOLHDL 2.1 05/25/2020 0736   CHOLHDL 2.3 12/28/2015 1121   VLDL 18 12/28/2015 1121   LDLCALC 55 05/25/2020 0736    Risk Assessment/Calculations:     .N/A  Physical Exam:    VS:  BP 120/68   Pulse 97   Ht 5\' 7"  (1.702 m)   Wt 189 lb (85.7 kg)   SpO2 98%   BMI 29.60 kg/m     Wt Readings from Last 3 Encounters:  06/17/20 189 lb (85.7 kg)  06/17/20 190 lb (86.2 kg)  05/25/20 190 lb (86.2 kg)    GEN:  Well nourished, well developed in no acute distress HEENT: Normal NECK: No JVD; No carotid bruits LYMPHATICS: No lymphadenopathy CARDIAC: RRR, no murmurs, rubs, gallops  RESPIRATORY:  Clear to auscultation without rales, wheezing or rhonchi  ABDOMEN: Soft, non-tender, non-distended MUSCULOSKELETAL:  No edema; No deformity  SKIN: Warm and dry NEUROLOGIC:  Alert and oriented x 3 PSYCHIATRIC:  Normal affect   ASSESSMENT:    1. Diabetes mellitus with coincident hypertension (HCC)   2. Mixed hyperlipidemia   3. Coronary artery calcification   4. Aortic atherosclerosis (HCC)    PLAN:  In order of problems listed above:  Diabetes with Hypertension - ambulatory blood pressure < 135/85, will continue ambulatory BP monitoring; gave education on how to perform ambulatory blood pressure monitoring including the frequency and technique; goal ambulatory blood pressure < 135/85 on average - continue home medications  - discussed diet (DASH/low sodium), and exercise/weight loss interventions   Hyperlipidemia Coronary artery calcification and aortic atherosclreosis -LDL goal less than 70 (at goal) -continue current statin - gave education on dietary changes - reviewed CT images with patient and answered questions  Six month follow up unless new symptoms or abnormal test results warranting change in plan  Would be reasonable for  APP Follow up   Medication Adjustments/Labs and Tests Ordered: Current medicines are reviewed at length with the patient today.  Concerns regarding medicines are outlined above.  No orders of the defined types were placed in this encounter.  No orders of the defined types were placed in this encounter.   Patient Instructions  Medication Instructions:  Your physician recommends that you continue on your current medications as directed. Please refer to the Current Medication list given to you today.  *If you need a refill on your cardiac medications before  your next appointment, please call your pharmacy*   Lab Work: NONE If you have labs (blood work) drawn today and your tests are completely normal, you will receive your results only by: Marland Kitchen MyChart Message (if you have MyChart) OR . A paper copy in the mail If you have any lab test that is abnormal or we need to change your treatment, we will call you to review the results.   Testing/Procedures: NONE   Follow-Up: At [facility], you and your health needs are our priority.  As part of our continuing mission to provide you with exceptional heart care, we have created designated Provider Care Teams.  These Care Teams include your primary Cardiologist (physician) and Advanced Practice Providers (APPs -  Physician Assistants and Nurse Practitioners) who all work together to provide you with the care you need, when you need it.      Your next appointment:   6 month(s)  The format for your next appointment:   In Person  Provider:   You may see Werner Lean, MD or one of the following Advanced Practice Providers on your designated Care Team:    Melina Copa, PA-C  Ermalinda Barrios, PA-C          Signed, Werner Lean, MD  06/17/2020 4:42 PM    [place]

## 2020-06-17 NOTE — Progress Notes (Signed)
    Kathy Duncan 1955-07-13 [id number]        64 y.o.  G2P2L2  RP: Urinary frequency and vulvar itching  HPI:  Urinary frequency, no burning, no blood in urine.  No fever.  Abstinent.  C/O vulvar itching, scratching causing irritation.  Mild vaginal discharge.  Not helped by OTC treatments.   OB History  Gravida Para Term Preterm AB Living  2 2       2   SAB IAB Ectopic Multiple Live Births               # Outcome Date GA Lbr Len/2nd Weight Sex Delivery Anes PTL Lv  2 Para           1 Para             Past medical history,surgical history, problem list, medications, allergies, family history and social history were all reviewed and documented in the EPIC chart.   Directed ROS with pertinent positives and negatives documented in the history of present illness/assessment and plan.  Exam:  Vitals:   06/17/20 1407  BP: 122/80  Weight: 190 lb (86.2 kg)  Height: 5\' 7"  (1.702 m)   General appearance:  Normal  Abdomen: Normal  Gynecologic exam: Vulva erythema/Dryness.  Speculum:  Vagina normal.  Increased d/c.  Wet prep done.  U/A: Negative Wet prep:  Yeasts present   Assessment/Plan:  65 y.o. G2P2   1. Urinary frequency Urine analysis negative, patient reassured. - Urinalysis,Complete w/RFL Culture  2. Vaginal discharge Yeast vaginitis confirmed by wet prep.  Will treat with fluconazole.  Usage reviewed and prescription sent to pharmacy. - WET PREP FOR TRICH, YEAST, CLUE  3. Vulvar itching Vulvar irritation with itching.  Probable vulvitis secondary to yeast vaginitis.  Clobetasol ointment sent for daily applications as needed for 2 weeks.  Other orders - fluconazole (DIFLUCAN) 150 MG tablet; Take 1 tablet (150 mg total) by mouth daily for 3 days. - clobetasol ointment (TEMOVATE) 0.05 %; Apply 1 application topically daily for 14 days.  Princess Bruins MD, 2:13 PM 06/17/2020

## 2020-06-27 ENCOUNTER — Encounter: Payer: Self-pay | Admitting: Obstetrics & Gynecology

## 2020-07-18 ENCOUNTER — Other Ambulatory Visit: Payer: Self-pay | Admitting: Family Medicine

## 2020-08-01 ENCOUNTER — Other Ambulatory Visit: Payer: Self-pay | Admitting: Family Medicine

## 2020-08-17 ENCOUNTER — Ambulatory Visit: Payer: Managed Care, Other (non HMO) | Admitting: Family Medicine

## 2020-08-17 ENCOUNTER — Other Ambulatory Visit: Payer: Self-pay

## 2020-08-17 ENCOUNTER — Encounter: Payer: Self-pay | Admitting: Family Medicine

## 2020-08-17 VITALS — BP 125/60 | HR 82 | Ht 67.0 in | Wt 187.8 lb

## 2020-08-17 DIAGNOSIS — Z6831 Body mass index (BMI) 31.0-31.9, adult: Secondary | ICD-10-CM

## 2020-08-17 DIAGNOSIS — E119 Type 2 diabetes mellitus without complications: Secondary | ICD-10-CM

## 2020-08-17 DIAGNOSIS — I10 Essential (primary) hypertension: Secondary | ICD-10-CM

## 2020-08-17 DIAGNOSIS — E1169 Type 2 diabetes mellitus with other specified complication: Secondary | ICD-10-CM | POA: Diagnosis not present

## 2020-08-17 DIAGNOSIS — E669 Obesity, unspecified: Secondary | ICD-10-CM | POA: Diagnosis not present

## 2020-08-17 DIAGNOSIS — E1165 Type 2 diabetes mellitus with hyperglycemia: Secondary | ICD-10-CM

## 2020-08-17 DIAGNOSIS — I700 Atherosclerosis of aorta: Secondary | ICD-10-CM

## 2020-08-17 LAB — POCT GLYCOSYLATED HEMOGLOBIN (HGB A1C): HbA1c, POC (controlled diabetic range): 9.2 % — AB (ref 0.0–7.0)

## 2020-08-17 MED ORDER — CONTOUR TEST VI STRP: ORAL_STRIP | 12 refills | Status: DC

## 2020-08-17 MED ORDER — SITAGLIPTIN PHOSPHATE 50 MG PO TABS: 50.0000 mg | ORAL_TABLET | Freq: Every day | ORAL | 1 refills | Status: DC

## 2020-08-17 MED ORDER — ONETOUCH DELICA LANCETS 33G MISC: 12 refills | Status: DC

## 2020-08-17 NOTE — Patient Instructions (Signed)
Diabetes Mellitus Action Plan Following a diabetes action plan is a way for you to manage your diabetes (diabetes mellitus) symptoms. The plan is color-coded to help you understand what actions you need to take based on any symptoms you are having.  If you have symptoms in the red zone, you need medical care right away.  If you have symptoms in the yellow zone, you are having problems.  If you have symptoms in the green zone, you are doing well. Learning about and understanding diabetes can take time. Follow the plan that you develop with your health care provider. Know the target range for your blood sugar (glucose) level, and review your treatment plan with your health care provider at each visit. The target range for my blood sugar level is __________________________ mg/dL. Red zone Get medical help right away if you have any of the following symptoms:  A blood sugar test result that is below 54 mg/dL (3 mmol/L).  A blood sugar test result that is at or above 240 mg/dL (13.3 mmol/L) for 2 days in a row.  Confusion or trouble thinking clearly.  Difficulty breathing.  Sickness or a fever for 2 or more days that is not getting better.  Moderate or large ketone levels in your urine.  Feeling tired or having no energy. If you have any red zone symptoms, do not wait to see if the symptoms will go away. Get medical help right away. Call your local emergency services (911 in the [place] Do not drive yourself to the hospital. If you have severely low blood sugar (severe hypoglycemia) and you cannot eat or drink, you may need glucagon. Make sure a family member or close friend knows how to check your blood sugar and how to give you glucagon. You may need to be treated in a hospital for this condition.   Yellow zone If you have any of the following symptoms, your diabetes is not under control and you may need to make some changes:  A blood sugar test result that is at or above 240 mg/dL (13.3  mmol/L) for 2 days in a row.  Blood sugar test results that are below 70 mg/dL (3.9 mmol/L).  Other symptoms of hypoglycemia, such as: ? Shaking or feeling light-headed. ? Confusion or irritability. ? Feeling hungry. ? Having a fast heartbeat. If you have any yellow zone symptoms:  Treat your hypoglycemia by eating or drinking 15 grams of a rapid-acting carbohydrate. Follow the 15:15 rule: ? Take 15 grams of a rapid-acting carbohydrate, such as:  1 tube of glucose gel.  4 glucose pills.  4 oz (120 mL) of fruit juice.  4 oz (120 mL) of regular (not diet) soda. ? Check your blood sugar 15 minutes after you take the carbohydrate. ? If the repeat blood sugar test is still at or below 70 mg/dL (3.9 mmol/L), take 15 grams of a carbohydrate again. ? If your blood sugar does not increase above 70 mg/dL (3.9 mmol/L) after 3 tries, get medical help right away. ? After your blood sugar returns to normal, eat a meal or a snack within 1 hour.  Keep taking your daily medicines as told by your health care provider.  Check your blood sugar more often than you normally would. ? Write down your results. ? Call your health care provider if you have trouble keeping your blood sugar in your target range.   Green zone These signs mean you are doing well and you can continue what you  are doing to manage your diabetes:  Your blood sugar is within your personal target range. For most people, a blood sugar level before a meal (preprandial) should be 80-130 mg/dL (4.4-7.2 mmol/L).  You feel well, and you are able to do daily activities. If you are in the green zone, continue to manage your diabetes as told by your health care provider. To do this:  Eat a healthy diet.  Exercise regularly.  Check your blood sugar as told by your health care provider.  Take your medicines as told by your health care provider.   Where to find more information  American Diabetes Association (ADA):  diabetes.org  Association of Diabetes Care & Education Specialists (ADCES): diabeteseducator.org Summary  Following a diabetes action plan is a way for you to manage your diabetes symptoms. The plan is color-coded to help you understand what actions you need to take based on any symptoms you are having.  Follow the plan that you develop with your health care provider. Make sure you know your personal target blood sugar level.  Review your treatment plan with your health care provider at each visit. This information is not intended to replace advice given to you by your health care provider. Make sure you discuss any questions you have with your health care provider. Document Revised: 11/06/2019 Document Reviewed: 11/06/2019 Elsevier Patient Education  [place].

## 2020-08-17 NOTE — Assessment & Plan Note (Signed)
Body mass index is 29.41 kg/m.  Improved with her weight. I commended her on this. Monitor closely.

## 2020-08-17 NOTE — Assessment & Plan Note (Addendum)
Here A1C increased from 8.5 to 9.2 I discussed adding on GLP1 vs DPP-4 She tried Januvia in the past and will like to try that again. She self d/c it in the past for excessive sweating. She will let me know if she is unable to tolerate this med. Plan to combine it with Metformin in the future if well tolerated and titrate down her insulin as needed. She agreed with the plan. Glucometer test strip ordered.

## 2020-08-17 NOTE — Progress Notes (Signed)
    SUBJECTIVE:   CHIEF COMPLAINT / HPI:   DM2/Atherosclerosis: She is compliant with Metformin 1000 mg BID, Farxiga 10 mg QD, Basaglar 25 units QD. Her CBG ranges from a low of 64 to a high of 236. She denies hypoglycemic episodes. She drinks juice with a high concentrated sugar on and off. Otherwise, healthy diet. She requested a refill of her glucometer test strip and lancet. She takes crestor as prescribed.  Weight mgt: working on diet control and exercise.  PERTINENT  PMH / PSH: PMX reviewed  OBJECTIVE:   BP 125/60   Pulse 82   Ht 5\' 7"  (1.702 m)   Wt 187 lb 12.8 oz (85.2 kg)   SpO2 98%   BMI 29.41 kg/m   Physical Exam Vitals and nursing note reviewed.  Cardiovascular:     Rate and Rhythm: Normal rate and regular rhythm.     Pulses: Normal pulses.     Heart sounds: Normal heart sounds. No murmur heard.   Pulmonary:     Effort: Pulmonary effort is normal. No respiratory distress.     Breath sounds: Normal breath sounds. No wheezing.  Abdominal:     General: Bowel sounds are normal. There is no distension.     Palpations: Abdomen is soft. There is no mass.     Tenderness: There is no abdominal tenderness.  Musculoskeletal:        General: Normal range of motion.     Right lower leg: No edema.     Left lower leg: No edema.  Neurological:     General: No focal deficit present.  Psychiatric:        Mood and Affect: Mood normal.    [place] Office Visit from 08/17/2020 in [place]  PHQ-9 Total Score 0        ASSESSMENT/PLAN:   Diabetes mellitus with coincident hypertension ([place]) Here A1C increased from 8.5 to 9.2 I discussed adding on GLP1 vs DPP-4 She tried Januvia in the past and will like to try that again. She self d/c it in the past for excessive sweating. She will let me know if she is unable to tolerate this med. Plan to combine it with Metformin in the future if well tolerated and titrate down her insulin as needed. She  agreed with the plan. Glucometer test strip ordered.  OBESITY, NOS Body mass index is 29.41 kg/m.  Improved with her weight. I commended her on this. Monitor closely.  Aortic atherosclerosis ([place]) Compliant with her Crestor.     Andrena Mews, MD [place]

## 2020-08-17 NOTE — Assessment & Plan Note (Signed)
Compliant with her Crestor.

## 2020-08-18 ENCOUNTER — Other Ambulatory Visit: Payer: Self-pay

## 2020-08-18 MED ORDER — CONTOUR TEST VI STRP: ORAL_STRIP | 12 refills | Status: DC

## 2020-09-24 ENCOUNTER — Encounter: Payer: Self-pay | Admitting: Family Medicine

## 2020-09-24 ENCOUNTER — Ambulatory Visit: Payer: Managed Care, Other (non HMO) | Admitting: Family Medicine

## 2020-09-24 ENCOUNTER — Other Ambulatory Visit: Payer: Self-pay

## 2020-09-24 VITALS — BP 126/73 | HR 82 | Ht 67.0 in | Wt 180.4 lb

## 2020-09-24 DIAGNOSIS — E663 Overweight: Secondary | ICD-10-CM | POA: Insufficient documentation

## 2020-09-24 DIAGNOSIS — E119 Type 2 diabetes mellitus without complications: Secondary | ICD-10-CM

## 2020-09-24 DIAGNOSIS — I10 Essential (primary) hypertension: Secondary | ICD-10-CM

## 2020-09-24 LAB — POCT GLYCOSYLATED HEMOGLOBIN (HGB A1C): HbA1c, POC (controlled diabetic range): 8.2 % — AB (ref 0.0–7.0)

## 2020-09-24 MED ORDER — SITAGLIPTIN PHOSPHATE 100 MG PO TABS: 100.0000 mg | ORAL_TABLET | Freq: Every day | ORAL | 1 refills | Status: DC

## 2020-09-24 MED ORDER — BASAGLAR KWIKPEN 100 UNIT/ML ~~LOC~~ SOPN: 20.0000 [IU] | PEN_INJECTOR | Freq: Every day | SUBCUTANEOUS | 2 refills | Status: DC

## 2020-09-24 NOTE — Progress Notes (Signed)
    SUBJECTIVE:   CHIEF COMPLAINT / HPI:   DM2/HTN/weight: Here for follow-up. She denies any concerns. She has cut back on her daily calorie intake as well as low carbs and sugar diet. Her home CBG reviewed on her CBG over the past week was 93,132,118,103, 68, 80, 108. Some hypoglyc symptoms on and off.   PERTINENT  PMH / PSH: PMX reviewed  OBJECTIVE:   BP 126/73   Pulse 82   Ht 5\' 7"  (1.702 m)   Wt 180 lb 6.4 oz (81.8 kg)   SpO2 98%   BMI 28.25 kg/m   Physical Exam Vitals and nursing note reviewed.  Cardiovascular:     Rate and Rhythm: Normal rate and regular rhythm.     Heart sounds: Normal heart sounds. No murmur heard.   Pulmonary:     Effort: Pulmonary effort is normal. No respiratory distress.     Breath sounds: Normal breath sounds. No wheezing.  Abdominal:     General: Abdomen is flat. Bowel sounds are normal. There is no distension.     Palpations: There is no mass.     Tenderness: There is no abdominal tenderness.  Musculoskeletal:     Right lower leg: No edema.     Left lower leg: No edema.      ASSESSMENT/PLAN:   Diabetes mellitus with coincident hypertension (HCC) Her A1C improved from 9 to 8. Reduce Basaglar to 20 units from 25 units due to intermittent hypoglycemia. Increase Januvia to 100 mg QD. Januvia and insulin refilled. Continue current dose of Farxiga and Metformin. F/U in 2-3 months. Follow-up sooner if home CBG is running low. Continue home antihypertensive agents. Doing well overall.  Overweight She is doing well with diet control for weight loss. Monitor closely.     Andrena Mews, MD [employer]

## 2020-09-24 NOTE — Patient Instructions (Signed)
It was nice seeing you today. Your glucose looks good today. We will cut back on your insulin to 20 units daily from 25 and increase Januvia to 100 mg daily. I will like to see you back in 1-2 months. Please call if your sugar is running too lowl

## 2020-09-24 NOTE — Assessment & Plan Note (Addendum)
Her A1C improved from 9 to 8. Reduce Basaglar to 20 units from 25 units due to intermittent hypoglycemia. Increase Januvia to 100 mg QD. Januvia and insulin refilled. Continue current dose of Farxiga and Metformin. F/U in 2-3 months. Follow-up sooner if home CBG is running low. Continue home antihypertensive agents. Doing well overall.

## 2020-09-24 NOTE — Assessment & Plan Note (Signed)
She is doing well with diet control for weight loss. Monitor closely.

## 2020-10-31 ENCOUNTER — Other Ambulatory Visit: Payer: Self-pay | Admitting: Family Medicine

## 2020-11-01 ENCOUNTER — Encounter: Payer: Self-pay | Admitting: Family Medicine

## 2020-11-01 DIAGNOSIS — I10 Essential (primary) hypertension: Secondary | ICD-10-CM | POA: Insufficient documentation

## 2020-11-08 ENCOUNTER — Other Ambulatory Visit: Payer: Self-pay | Admitting: Family Medicine

## 2020-11-08 DIAGNOSIS — Z1231 Encounter for screening mammogram for malignant neoplasm of breast: Secondary | ICD-10-CM

## 2020-11-29 ENCOUNTER — Telehealth: Payer: Self-pay | Admitting: Family Medicine

## 2020-11-29 NOTE — Telephone Encounter (Signed)
Patient returned call to nurse line. Rescheduled appointment for August 2nd. Patient has eye doctor appointment earlier the same day and requested that both appointments be on the same day.   Talbot Grumbling, RN

## 2020-11-29 NOTE — Telephone Encounter (Signed)
HIPAA compliant callback message left.  Please, advise when she calls - Need to move appointment to August as she is not due for A1C

## 2020-11-30 ENCOUNTER — Ambulatory Visit: Payer: Managed Care, Other (non HMO) | Admitting: Family Medicine

## 2020-12-13 NOTE — Progress Notes (Signed)
Cardiology Office Note:    Date:  12/14/2020   ID:  Kathy Duncan, DOB [birth year], MRN [id number]  PCP:  Kinnie Feil, MD  [facility] Cardiologist:  Werner Lean, MD  [place] Electrophysiologist:  None   CC: Follow up HLD  History of Present Illness:    Kathy Duncan is a 65 y.o. female with a hx of Diabetes with Hypertension, HLD, who presents for evaluation 04/13/20.  In interval had CCTA with mild non-obstructive disease.  Last seen 06/2020.  In interim of this visit, patient no changes- seen 12/14/20.  Patient notes that she is doing good.  Since last visit notes no changes.  Still working at [employer] but no longer has to lift things. Relevant interval testing or therapy include no medications..  There are no interval hospital/ED visit.    No chest pain or pressure.  No SOB/DOE and no PND/Orthopnea.  No weight gain or leg swelling.  No palpitations or syncope.  Ambulatory blood pressure highest SBP 110- 130.  Past Medical History:  Diagnosis Date   Abnormal vaginal bleeding 07/29/2013   Anemia 07/12/2006   09/12/11: Anemia panel not suggestive of iron or B12 deficiency.  CBC with diff shows no pancytopenia.  Colonoscopy 2008 and referral in 2010 for heme positive stools not suggestive of need for further evaluation.  Most likely due to tamoxifen.    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   FARXIGA 10 MG TABS tablet TAKE 1 TABLET BY MOUTH EVERY DAY   ferrous sulfate 325 (65 FE) MG tablet Take 1 tablet (325 mg total) by mouth daily with breakfast.   glucose blood (CONTOUR TEST) test strip Use to check CBG TID   Insulin Glargine (BASAGLAR KWIKPEN) 100 UNIT/ML Inject 20 Units into the skin daily.   Insulin Pen Needle (B-D ULTRAFINE III SHORT PEN) 31G X 8 MM MISC Use to check capillary glucose TID   Insulin Syringe-Needle U-100 (B-D INS SYR ULTRAFINE 1CC/30G) 30G X 1/2" 1 ML MISC Use to check capillary glucose TID   losartan-hydrochlorothiazide (HYZAAR) 100-25 MG tablet TAKE 1 TABLET BY MOUTH EVERY DAY   metFORMIN (GLUCOPHAGE) 1000 MG tablet TAKE 1 TABLET BY MOUTH TWO TIMES DAILY WITH A MEAL   nitroGLYCERIN (NITROSTAT) 0.4 MG SL tablet Place 1 tablet (0.4 mg total) under the tongue every 5 (five) minutes as needed for chest pain.   OneTouch Delica Lancets 99991111 MISC Use to check CBG TID   rosuvastatin (CRESTOR) 20 MG tablet TAKE 1 TABLET BY MOUTH EVERY DAY   sitaGLIPtin (JANUVIA) 100 MG tablet Take 1 tablet (100 mg total) by mouth daily.     Allergies:   Ace inhibitors   Social History   Socioeconomic History   Marital  status: Married    Spouse name: Not on file   Number of children: Not on file   Years of education: Not on file   Highest education level: Not on file  Occupational History   Not on file  Tobacco Use   Smoking status: Never   Smokeless tobacco: Never  Vaping Use   Vaping Use: Never used  Substance and Sexual Activity   Alcohol use: Yes   Drug use: No   Sexual activity: Not Currently    Comment: 1st intercourse 65 yo-Fewer than 5 partners  Other Topics Concern   Not on file  Social History Narrative   Works at [employer], does some heavy lifting.  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   Social Determinants of Health   Financial Resource  Strain: Not on file  Food Insecurity: Not on file  Transportation Needs: Not on file  Physical Activity: Not on file  Stress: Not on file  Social Connections: Not on file    Family History: The patient's family history includes Cancer in her brother; Heart disease in her mother and sister; Stroke in her father.  Mother- CABG at age 64 Sister - CABG at age 68  ROS:   Please see the history of present illness.    All other systems reviewed and are negative.  EKGs/Labs/Other Studies Reviewed:    The following studies were reviewed today:  EKG:   03/25/20:  SR rate 63, no ST/T changes  Cardiac CT: Date: 05/27/2020 Results: IMPRESSION: 1. Coronary calcium score of 43.7. This was 80th percentile for age, sex, and race matched control.   2. Normal coronary origin with right dominance.   3. CAD-RADS 2. Mild non-obstructive CAD (25-49%). Consider non-atherosclerotic causes of chest pain. Consider preventive therapy and risk factor modification.   4. Aortic atherosclerosis noted.   Recent Labs: 03/25/2020: Platelets 310 04/27/2020: Hemoglobin 11.4; TSH 1.070 05/25/2020: ALT 10; BUN 26; Creatinine, Ser 0.96; Potassium 4.0; Sodium 142  Recent Lipid Panel    Component Value Date/Time   CHOL 130 05/25/2020 0736   TRIG 57 05/25/2020 0736   HDL 63 05/25/2020 0736   CHOLHDL 2.1 05/25/2020 0736   CHOLHDL 2.3 12/28/2015 1121   VLDL 18 12/28/2015 1121   LDLCALC 55 05/25/2020 0736    Risk Assessment/Calculations:     .N/A  Physical Exam:    VS:  BP 126/68   Pulse 77   Ht '5\' 7"'$  (1.702 m)   Wt 176 lb (79.8 kg)   SpO2 98%   BMI 27.57 kg/m     Wt Readings from Last 3 Encounters:  12/14/20 176 lb (79.8 kg)  09/24/20 180 lb 6.4 oz (81.8 kg)  08/17/20 187 lb 12.8 oz (85.2 kg)    GEN: Well nourished, well developed in no acute distress HEENT: Normal NECK: No JVD  LYMPHATICS: No lymphadenopathy CARDIAC: RRR, no murmurs, rubs, gallops  RESPIRATORY:  Clear to  auscultation without rales, wheezing or rhonchi  ABDOMEN: Soft, non-tender, non-distended MUSCULOSKELETAL:  No edema; No deformity  SKIN: Warm and dry NEUROLOGIC:  Alert and oriented x 3 PSYCHIATRIC:  Normal affect   ASSESSMENT:    1. Aortic atherosclerosis ([place])   2. Diabetes mellitus with coincident hypertension ([place])   3. Mixed hyperlipidemia   4. Coronary artery disease involving native coronary artery of native heart without angina pectoris     PLAN:    In order of problems listed above:  Mild Non-obstructive CAD Aortic atherosclerosis Hyperlipidemia HTN with DM -LDL  goal less than 70 (at goal) - gave education on dietary changes - ASA 81 mg PO Daily (will add to [facility] - on losartan HCTZ 100-25 mg - on rosuvastatin 20 mg PO Daily - has need no PRN nitro but has it   Medication Adjustments/Labs and Tests Ordered: Current medicines are reviewed at length with the patient today.  Concerns regarding medicines are outlined above.  No orders of the defined types were placed in this encounter.  No orders of the defined types were placed in this encounter.   There are no Patient Instructions on file for this visit.   Signed, Werner Lean, MD  12/14/2020 9:04 AM    [place]

## 2020-12-14 ENCOUNTER — Ambulatory Visit: Payer: Managed Care, Other (non HMO) | Admitting: Internal Medicine

## 2020-12-14 ENCOUNTER — Encounter: Payer: Self-pay | Admitting: Internal Medicine

## 2020-12-14 ENCOUNTER — Other Ambulatory Visit: Payer: Self-pay

## 2020-12-14 ENCOUNTER — Ambulatory Visit: Payer: Managed Care, Other (non HMO) | Admitting: Family Medicine

## 2020-12-14 ENCOUNTER — Encounter: Payer: Self-pay | Admitting: Family Medicine

## 2020-12-14 VITALS — BP 130/72 | HR 74 | Ht 67.0 in | Wt 175.0 lb

## 2020-12-14 VITALS — BP 126/68 | HR 77 | Ht 67.0 in | Wt 176.0 lb

## 2020-12-14 DIAGNOSIS — E119 Type 2 diabetes mellitus without complications: Secondary | ICD-10-CM

## 2020-12-14 DIAGNOSIS — E782 Mixed hyperlipidemia: Secondary | ICD-10-CM

## 2020-12-14 DIAGNOSIS — E1169 Type 2 diabetes mellitus with other specified complication: Secondary | ICD-10-CM | POA: Diagnosis not present

## 2020-12-14 DIAGNOSIS — I2510 Atherosclerotic heart disease of native coronary artery without angina pectoris: Secondary | ICD-10-CM | POA: Diagnosis not present

## 2020-12-14 DIAGNOSIS — I10 Essential (primary) hypertension: Secondary | ICD-10-CM

## 2020-12-14 DIAGNOSIS — Z23 Encounter for immunization: Secondary | ICD-10-CM | POA: Diagnosis not present

## 2020-12-14 DIAGNOSIS — I700 Atherosclerosis of aorta: Secondary | ICD-10-CM | POA: Diagnosis not present

## 2020-12-14 DIAGNOSIS — E2839 Other primary ovarian failure: Secondary | ICD-10-CM | POA: Diagnosis not present

## 2020-12-14 LAB — POCT GLYCOSYLATED HEMOGLOBIN (HGB A1C): HbA1c, POC (controlled diabetic range): 7.4 % — AB (ref 0.0–7.0)

## 2020-12-14 NOTE — Patient Instructions (Signed)
Bone Density Test A bone density test uses a type of X-ray to measure the amount of calcium and other minerals in a person's bones. It can measure bone density in the hip and the spine. The test is similar to having a regular X-ray. This test may also be called: Bone densitometry. Bone mineral density test. Dual-energy X-ray absorptiometry (DEXA). You may have this test to: Diagnose a condition that causes weak or thin bones (osteoporosis). Screen you for osteoporosis. Predict your risk for a broken bone (fracture). Determine how well your osteoporosis treatment is working. Tell a health care provider about: Any allergies you have. All medicines you are taking, including vitamins, herbs, eye drops, creams, and over-the-counter medicines. Any problems you or family members have had with anesthetic medicines. Any blood disorders you have. Any surgeries you have had. Any medical conditions you have. Whether you are pregnant or may be pregnant. Any medical tests you have had within the past 14 days that used contrast material. What are the risks? Generally, this is a safe test. However, it does expose you to a small amountof radiation, which can slightly increase your cancer risk. What happens before the test? Do not take any calcium supplements within the 24 hours before your test. You will need to remove all metal jewelry, eyeglasses, removable dental appliances, and any other metal objects on your body. What happens during the test?  You will lie down on an exam table. There will be an X-ray generator below you and an imaging device above you. Other devices, such as boxes or braces, may be used to position your body properly for the scan. The machine will slowly scan your body. You will need to keep very still while the machine does the scan. The images will show up on a screen in the room. Images will be examined by a specialist after your test is finished. The procedure may vary among  health care providers and hospitals. What can I expect after the test? It is up to you to get the results of your test. Ask your health care provider,or the department that is doing the test, when your results will be ready. Summary A bone density test is an imaging test that uses a type of X-ray to measure the amount of calcium and other minerals in your bones. The test may be used to diagnose or screen you for a condition that causes weak or thin bones (osteoporosis), predict your risk for a broken bone (fracture), or determine how well your osteoporosis treatment is working. Do not take any calcium supplements within 24 hours before your test. Ask your health care provider, or the department that is doing the test, when your results will be ready. This information is not intended to replace advice given to you by your health care provider. Make sure you discuss any questions you have with your healthcare provider. Document Revised: 10/16/2019 Document Reviewed: 10/16/2019 Elsevier Patient Education  [place].

## 2020-12-14 NOTE — Patient Instructions (Signed)
Medication Instructions:  Your physician recommends that you continue on your current medications as directed. Please refer to the Current Medication list given to you today.  *If you need a refill on your cardiac medications before your next appointment, please call your pharmacy*   Lab Work: NONE If you have labs (blood work) drawn today and your tests are completely normal, you will receive your results only by: [place] (if you have MyChart) OR A paper copy in the mail If you have any lab test that is abnormal or we need to change your treatment, we will call you to review the results.   Testing/Procedures: NONE   Follow-Up: At [facility], you and your health needs are our priority.  As part of our continuing mission to provide you with exceptional heart care, we have created designated Provider Care Teams.  These Care Teams include your primary Cardiologist (physician) and Advanced Practice Providers (APPs -  Physician Assistants and Nurse Practitioners) who all work together to provide you with the care you need, when you need it.   Your next appointment:   12 month(s)  The format for your next appointment:   In Person  Provider:   Rudean Haskell, MD

## 2020-12-14 NOTE — Progress Notes (Signed)
    SUBJECTIVE:   CHIEF COMPLAINT / HPI:   DM: She is compliant with Basaglar 20 units QD, Metformin 1000 mg BID, Farxiga 10 MG QD and Januvia 100 mg QD. Her home CBG is mostly in the 130s, highest of 160 and lowest 69. No new concerns.   HM: Need vaccination update.  PERTINENT  PMH / PSH: PMX reviewed.  OBJECTIVE:   BP 130/72   Pulse 74   Ht '5\' 7"'$  (1.702 m)   Wt 175 lb (79.4 kg)   SpO2 100%   BMI 27.41 kg/m   Physical Exam Vitals and nursing note reviewed.  Cardiovascular:     Rate and Rhythm: Normal rate and regular rhythm.     Heart sounds: Normal heart sounds. No murmur heard. Pulmonary:     Effort: Pulmonary effort is normal. No respiratory distress.     Breath sounds: Normal breath sounds. No wheezing.  Musculoskeletal:     Right lower leg: No edema.     Left lower leg: No edema.     ASSESSMENT/PLAN:   Diabetes mellitus with coincident hypertension (HCC) A1C improved to 7.4 Continue current regimen F/u in 3-4 months for reassessment.   Health maintenance: She received two doses of Shingrix. I called her pharmacy during this appointment to confirm vaccination dates Dexa scan discussed and I gave her the card to schedule an appointment. Dexa scan order placed. PCV20 given today. We are out of COVID-19 vaccine, hence, unable to obtain her booster dose today.  Andrena Mews, MD [place]

## 2020-12-14 NOTE — Assessment & Plan Note (Signed)
A1C improved to 7.4 Continue current regimen F/u in 3-4 months for reassessment.

## 2020-12-29 ENCOUNTER — Other Ambulatory Visit: Payer: Managed Care, Other (non HMO)

## 2020-12-30 ENCOUNTER — Ambulatory Visit: Payer: Managed Care, Other (non HMO)

## 2021-01-11 ENCOUNTER — Other Ambulatory Visit: Payer: Self-pay

## 2021-01-11 ENCOUNTER — Ambulatory Visit
Admission: RE | Admit: 2021-01-11 | Discharge: 2021-01-11 | Disposition: A | Discharge status: Home / Self Care | Payer: Managed Care, Other (non HMO) | Source: Ambulatory Visit | Attending: Family Medicine | Admitting: Family Medicine

## 2021-01-11 ENCOUNTER — Inpatient Hospital Stay: Admission: RE | Admit: 2021-01-11 | Payer: Managed Care, Other (non HMO) | Source: Ambulatory Visit

## 2021-01-11 DIAGNOSIS — E2839 Other primary ovarian failure: Secondary | ICD-10-CM

## 2021-01-14 ENCOUNTER — Telehealth: Payer: Self-pay | Admitting: Family Medicine

## 2021-01-14 NOTE — Telephone Encounter (Signed)
Result discussed.  Osteopenia - Vitamin D and calcium supplement recommended.  She agreed with the plan.

## 2021-01-15 ENCOUNTER — Other Ambulatory Visit: Payer: Self-pay | Admitting: Family Medicine

## 2021-01-21 ENCOUNTER — Other Ambulatory Visit: Payer: Self-pay | Admitting: Family Medicine

## 2021-01-30 ENCOUNTER — Other Ambulatory Visit: Payer: Self-pay | Admitting: Family Medicine

## 2021-02-01 ENCOUNTER — Other Ambulatory Visit: Payer: Self-pay

## 2021-02-01 ENCOUNTER — Ambulatory Visit
Admission: RE | Admit: 2021-02-01 | Discharge: 2021-02-01 | Disposition: A | Discharge status: Home / Self Care | Payer: Managed Care, Other (non HMO) | Source: Ambulatory Visit | Attending: Family Medicine | Admitting: Family Medicine

## 2021-02-01 DIAGNOSIS — Z1231 Encounter for screening mammogram for malignant neoplasm of breast: Secondary | ICD-10-CM

## 2021-02-02 ENCOUNTER — Encounter (INDEPENDENT_AMBULATORY_CARE_PROVIDER_SITE_OTHER): Payer: Self-pay | Admitting: Ophthalmology

## 2021-02-02 ENCOUNTER — Ambulatory Visit (INDEPENDENT_AMBULATORY_CARE_PROVIDER_SITE_OTHER): Payer: Managed Care, Other (non HMO) | Admitting: Ophthalmology

## 2021-02-02 DIAGNOSIS — H43821 Vitreomacular adhesion, right eye: Secondary | ICD-10-CM | POA: Diagnosis not present

## 2021-02-02 DIAGNOSIS — E103292 Type 1 diabetes mellitus with mild nonproliferative diabetic retinopathy without macular edema, left eye: Secondary | ICD-10-CM

## 2021-02-02 DIAGNOSIS — H43822 Vitreomacular adhesion, left eye: Secondary | ICD-10-CM | POA: Diagnosis not present

## 2021-02-02 DIAGNOSIS — H2513 Age-related nuclear cataract, bilateral: Secondary | ICD-10-CM

## 2021-02-02 NOTE — Assessment & Plan Note (Signed)
Physiologic 

## 2021-02-02 NOTE — Assessment & Plan Note (Signed)
The nature of cataract was discussed with the patient as well as the elective nature of surgery. The patient was reassured that surgery at a later date does not put the patient at risk for a worse outcome. It was emphasized that the need for surgery is dictated by the patient's quality of life as influenced by the cataract. Patient was instructed to maintain close follow up with their general eye care doctor.  Cataract(s) account for the patient's complaint. I discussed the risks and benefits of cataract surgery. Options were explained to the patient. The patient understands that new glasses may not improve their vision and desires to have cataract surgery. I have recommended follow up with their general eye care doctor for evaluation and consideration of cataract extraction with new intraocular lens insertion.

## 2021-02-02 NOTE — Progress Notes (Signed)
02/02/2021     CHIEF COMPLAINT Patient presents for  Chief Complaint  Patient presents with   Retina Follow Up    9 Month Diabetic F/U OU  Pt denies noticeable changes to [facility] OU since last visit. Pt denies ocular pain, flashes of light, or floaters OU.   A1c: 8.0, 12/2019 LBS: 116 this AM      HISTORY OF PRESENT ILLNESS: Kathy Duncan is a 65 y.o. female who presents to the clinic today for:   HPI     Retina Follow Up   Patient presents with  Diabetic Retinopathy.  In both eyes.  This started 9 months ago.  Severity is mild.  Duration of 9 months.  Since onset it is stable. Additional comments: 9 Month Diabetic F/U OU  Pt denies noticeable changes to [place] OU since last visit. Pt denies ocular pain, flashes of light, or floaters OU.   A1c: 8.0, 12/2019 LBS: 116 this AM        Comments   9 mos fu ou fp oct. Patient states vision is stable and unchanged since last visit. Denies any new floaters or FOL. LBS: this morning 141 before breakfast A1C: 8.2 last checked Aug 2nd 2022 per patient.      Last edited by Laurin Coder on 02/02/2021  3:24 PM.      Referring physician: Kinnie Feil, MD [place],  [place] [id number]  HISTORICAL INFORMATION:   Selected notes from the MEDICAL RECORD NUMBER    Lab Results  Component Value Date   HGBA1C 7.4 (A) 12/14/2020     CURRENT MEDICATIONS: No current outpatient medications on file. (Ophthalmic Drugs)   No current facility-administered medications for this visit. (Ophthalmic Drugs)   Current Outpatient Medications (Other)  Medication Sig   diphenhydrAMINE (BENADRYL) 25 MG tablet Take 1 tablet (25 mg total) by mouth every 8 (eight) hours as needed. (Patient not taking: Reported on 12/14/2020)   FARXIGA 10 MG TABS tablet TAKE 1 TABLET BY MOUTH EVERY DAY   ferrous sulfate 325 (65 FE) MG tablet Take 1 tablet (325 mg total) by mouth daily with breakfast.   glucose blood (CONTOUR TEST) test  strip Use to check CBG TID   Insulin Glargine (BASAGLAR KWIKPEN) 100 UNIT/ML INJECT 20 UNITS INTO THE SKIN DAILY.   Insulin Pen Needle (B-D ULTRAFINE III SHORT PEN) 31G X 8 MM MISC Use to check capillary glucose TID   Insulin Syringe-Needle U-100 (B-D INS SYR ULTRAFINE 1CC/30G) 30G X 1/2" 1 ML MISC Use to check capillary glucose TID   losartan-hydrochlorothiazide (HYZAAR) 100-25 MG tablet TAKE 1 TABLET BY MOUTH EVERY DAY   metFORMIN (GLUCOPHAGE) 1000 MG tablet TAKE 1 TABLET BY MOUTH TWO TIMES DAILY WITH A MEAL   nitroGLYCERIN (NITROSTAT) 0.4 MG SL tablet Place 1 tablet (0.4 mg total) under the tongue every 5 (five) minutes as needed for chest pain. (Patient not taking: Reported on 0/05/7791)   OneTouch Delica Lancets 90Z MISC Use to check CBG TID   rosuvastatin (CRESTOR) 20 MG tablet TAKE 1 TABLET BY MOUTH EVERY DAY   sitaGLIPtin (JANUVIA) 100 MG tablet Take 1 tablet (100 mg total) by mouth daily.   No current facility-administered medications for this visit. (Other)      REVIEW OF SYSTEMS:    ALLERGIES Allergies  Allergen Reactions   Ace Inhibitors Cough    PAST MEDICAL HISTORY Past Medical History:  Diagnosis Date   Abnormal vaginal bleeding 07/29/2013   Anemia 07/12/2006  09/12/11: Anemia panel not suggestive of iron or B12 deficiency.  CBC with diff shows no pancytopenia.  Colonoscopy 2008 and referral in 2010 for heme positive stools not suggestive of need for further evaluation.  Most likely due to tamoxifen.    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   Procedure: RIGHT TOTAL KNEE ARTHROPLASTY;  Surgeon: Susa Day, MD;  Location: [facility];  Service:  Orthopedics;  Laterality: Right;  120 mins with abductor block    FAMILY HISTORY Family History  Problem Relation Age of Onset   Heart disease Mother    Stroke Father    Cancer Brother        Lung   Heart disease Sister     SOCIAL HISTORY Social History   Tobacco Use   Smoking status: Never   Smokeless tobacco: Never  Vaping Use   Vaping Use: Never used  Substance Use Topics   Alcohol use: Yes   Drug use: No         OPHTHALMIC EXAM:  Base Eye Exam     Visual Acuity (ETDRS)       Right Left   Dist [place] 20/30 -2 20/20 -1         Tonometry (Tonopen, 3:22 PM)       Right Left   Pressure 20 21  Holding breath and squinting        Pupils       Pupils Dark Light APD   Right PERRL 4 3 None   Left PERRL 4 3 None         Extraocular Movement       Right Left    Full Full         Neuro/Psych     Oriented x3: Yes   Mood/Affect: Normal         Dilation     Both eyes: 1.0% Mydriacyl, 2.5% Phenylephrine @ 3:22 PM           Slit Lamp and Fundus Exam     External Exam       Right Left   External Normal Normal         Slit Lamp Exam       Right Left   Lids/Lashes Normal Normal   Conjunctiva/Sclera White and quiet White and quiet   Cornea Clear Clear   Anterior Chamber Deep and quiet Deep and quiet   Iris Round and reactive Round and reactive   Lens 2+ Nuclear sclerosis, 2+ Cortical cataract 2+ Nuclear sclerosis, 2+ Cortical cataract   Anterior Vitreous Normal Normal         Fundus Exam       Right Left   Posterior Vitreous Normal Normal   Disc Normal Normal   C/D Ratio 0.35 0.45   Macula Normal Normal   Vessels NPDR- Mild NPDR- Mild   Periphery Normal Normal            IMAGING AND PROCEDURES  Imaging and Procedures for 02/02/21  Color Fundus Photography Optos - OU - Both Eyes       Right Eye  Progression has no prior data. Disc findings include normal observations. Macula : normal observations. Vessels :  normal observations. Periphery : normal observations.   Left Eye Progression has no prior data. Disc findings include normal observations. Macula : normal observations. Vessels : normal observations. Periphery : normal observations.      OCT, Retina - OU - Both Eyes       Right Eye Quality was good. Scan locations included subfoveal. Central Foveal Thickness: 317. Progression has been stable. Findings include vitreomacular adhesion , normal foveal contour.   Left Eye Quality was good. Scan locations included subfoveal. Central Foveal Thickness: 299. Progression has been stable. Findings include vitreomacular adhesion , normal foveal contour.   Notes No active maculopathy OU, and no foveal distortion             ASSESSMENT/PLAN:  Nuclear sclerotic cataract of both eyes The nature of cataract was discussed with the patient as well as the elective nature of surgery. The patient was reassured that surgery at a later date does not put the patient at risk for a worse outcome. It was emphasized that the need for surgery is dictated by the patient's quality of life as influenced by the cataract. Patient was instructed to maintain close follow up with their general eye care doctor.  Cataract(s) account for the patient's complaint. I discussed the risks and benefits of cataract surgery. Options were explained to the patient. The patient understands that new glasses may not improve their vision and desires to have cataract surgery. I have recommended follow up with their general eye care doctor for evaluation and consideration of cataract extraction with new intraocular lens insertion.  Vitreomacular adhesion of left eye Physiologic  Vitreomacular adhesion of right eye Physiologic     ICD-10-CM   1. 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 Nuclear sclerotic cataract of both eyes  H25.13     3. Vitreomacular adhesion of left eye  H43.822     4. Vitreomacular adhesion of right eye  H43.821       1.  OU with mild NPDR stable over time  2.  Physiologic VMA OU observe  3.  Bilateral significant nuclear sclerotic cataract for which I do recommend cataract surgery and evaluation.  We will offer patient consultation with [facility] eye care perhaps Dr. Kathlen Mody or Dr. Herbert Deaner  Ophthalmic Meds Ordered this visit:  No orders of the defined types were placed in this encounter.      Return in about 1 year (around 02/02/2022) for DILATE OU, OCT, COLOR FP.  There are no Patient Instructions on file for this visit.   Explained the diagnoses, plan, and follow up with the patient and they expressed understanding.  Patient expressed understanding of the importance of proper follow up care.   Clent Demark Annemarie Sebree M.D. Diseases & Surgery of the Retina and Vitreous Retina & Diabetic [place] 02/02/21     Abbreviations: M myopia (nearsighted); A astigmatism; H hyperopia (farsighted); P presbyopia; Mrx spectacle prescription;  CTL contact lenses; OD right eye; OS left eye; OU both eyes  XT exotropia; ET esotropia; PEK punctate epithelial keratitis; PEE punctate epithelial erosions; DES dry eye syndrome; MGD meibomian gland dysfunction; ATs artificial tears; PFAT's preservative free artificial tears; [place] nuclear sclerotic cataract; PSC posterior subcapsular cataract;  ERM epi-retinal membrane; PVD posterior vitreous detachment; RD retinal detachment; DM diabetes mellitus; DR diabetic retinopathy; NPDR non-proliferative diabetic retinopathy; PDR proliferative diabetic retinopathy; CSME clinically significant macular edema; DME diabetic macular edema; dbh dot blot hemorrhages; CWS cotton wool spot; POAG primary open angle glaucoma; C/D cup-to-disc ratio; HVF humphrey visual field; GVF goldmann visual field; OCT optical  coherence tomography; IOP intraocular pressure; BRVO Branch retinal vein occlusion; CRVO central retinal vein occlusion; CRAO central retinal artery occlusion; BRAO branch retinal artery occlusion; RT retinal tear; SB scleral buckle; PPV pars plana vitrectomy; VH Vitreous hemorrhage; PRP panretinal laser photocoagulation; IVK intravitreal kenalog; VMT vitreomacular traction; MH Macular hole;  NVD neovascularization of the disc; NVE neovascularization elsewhere; AREDS age related eye disease study; ARMD age related macular degeneration; POAG primary open angle glaucoma; EBMD epithelial/anterior basement membrane dystrophy; ACIOL anterior chamber intraocular lens; IOL intraocular lens; PCIOL posterior chamber intraocular lens; Phaco/IOL phacoemulsification with intraocular lens placement; [place] photorefractive keratectomy; LASIK laser assisted in situ keratomileusis; HTN hypertension; DM diabetes mellitus; COPD chronic obstructive pulmonary disease

## 2021-02-03 ENCOUNTER — Encounter (INDEPENDENT_AMBULATORY_CARE_PROVIDER_SITE_OTHER): Payer: Managed Care, Other (non HMO) | Admitting: Ophthalmology

## 2021-03-09 ENCOUNTER — Encounter (INDEPENDENT_AMBULATORY_CARE_PROVIDER_SITE_OTHER): Payer: Self-pay

## 2021-03-14 ENCOUNTER — Other Ambulatory Visit: Payer: Self-pay | Admitting: Family Medicine

## 2021-03-29 ENCOUNTER — Ambulatory Visit: Payer: Managed Care, Other (non HMO) | Admitting: Family Medicine

## 2021-04-10 ENCOUNTER — Other Ambulatory Visit: Payer: Self-pay | Admitting: Internal Medicine

## 2021-04-10 DIAGNOSIS — R072 Precordial pain: Secondary | ICD-10-CM

## 2021-04-11 ENCOUNTER — Other Ambulatory Visit: Payer: Self-pay | Admitting: Family Medicine

## 2021-04-21 ENCOUNTER — Other Ambulatory Visit: Payer: Self-pay | Admitting: Family Medicine

## 2021-04-29 ENCOUNTER — Encounter: Payer: Self-pay | Admitting: Family Medicine

## 2021-04-29 ENCOUNTER — Telehealth: Payer: Self-pay | Admitting: Family Medicine

## 2021-04-29 ENCOUNTER — Ambulatory Visit (INDEPENDENT_AMBULATORY_CARE_PROVIDER_SITE_OTHER): Payer: Managed Care, Other (non HMO)

## 2021-04-29 ENCOUNTER — Ambulatory Visit: Payer: Managed Care, Other (non HMO)

## 2021-04-29 ENCOUNTER — Ambulatory Visit: Payer: Managed Care, Other (non HMO) | Admitting: Family Medicine

## 2021-04-29 ENCOUNTER — Other Ambulatory Visit: Payer: Self-pay

## 2021-04-29 VITALS — BP 131/80 | HR 87 | Ht 67.0 in | Wt 167.4 lb

## 2021-04-29 DIAGNOSIS — E113293 Type 2 diabetes mellitus with mild nonproliferative diabetic retinopathy without macular edema, bilateral: Secondary | ICD-10-CM | POA: Insufficient documentation

## 2021-04-29 DIAGNOSIS — I10 Essential (primary) hypertension: Secondary | ICD-10-CM

## 2021-04-29 DIAGNOSIS — E1169 Type 2 diabetes mellitus with other specified complication: Secondary | ICD-10-CM | POA: Diagnosis not present

## 2021-04-29 DIAGNOSIS — Z23 Encounter for immunization: Secondary | ICD-10-CM

## 2021-04-29 DIAGNOSIS — E113299 Type 2 diabetes mellitus with mild nonproliferative diabetic retinopathy without macular edema, unspecified eye: Secondary | ICD-10-CM | POA: Diagnosis not present

## 2021-04-29 DIAGNOSIS — E782 Mixed hyperlipidemia: Secondary | ICD-10-CM

## 2021-04-29 DIAGNOSIS — E119 Type 2 diabetes mellitus without complications: Secondary | ICD-10-CM

## 2021-04-29 LAB — POCT GLYCOSYLATED HEMOGLOBIN (HGB A1C): HbA1c, POC (controlled diabetic range): 7.9 % — AB (ref 0.0–7.0)

## 2021-04-29 MED ORDER — ASPIRIN EC 81 MG PO TBEC: 81.0000 mg | DELAYED_RELEASE_TABLET | Freq: Every day | ORAL | 2 refills | Status: AC

## 2021-04-29 MED ORDER — ROSUVASTATIN CALCIUM 20 MG PO TABS: 20.0000 mg | ORAL_TABLET | Freq: Every day | ORAL | 1 refills | Status: DC

## 2021-04-29 MED ORDER — BASAGLAR KWIKPEN 100 UNIT/ML ~~LOC~~ SOPN: 22.0000 [IU] | PEN_INJECTOR | Freq: Every day | SUBCUTANEOUS | 2 refills | Status: DC

## 2021-04-29 MED ORDER — FLUCONAZOLE 150 MG PO TABS: 150.0000 mg | ORAL_TABLET | Freq: Once | ORAL | 0 refills | Status: AC

## 2021-04-29 MED ORDER — SITAGLIPTIN PHOSPHATE 100 MG PO TABS: 100.0000 mg | ORAL_TABLET | Freq: Every day | ORAL | 1 refills | Status: DC

## 2021-04-29 NOTE — Telephone Encounter (Signed)
HIPAA compliant callback message left.  Please connect her with me when she returns my call. Thanks.

## 2021-04-29 NOTE — Assessment & Plan Note (Signed)
Eye exam completed 02/02/21 Ophthalmologist's note reviewed.

## 2021-04-29 NOTE — Assessment & Plan Note (Signed)
BP looks good on her current regimen. 

## 2021-04-29 NOTE — Assessment & Plan Note (Signed)
Stable on current regimen   

## 2021-04-29 NOTE — Progress Notes (Signed)
° ° °  SUBJECTIVE:   CHIEF COMPLAINT / HPI:   DM2:  She is here to follow up with her DM. She is compliant with diet and exercise and has lost weight intentionally for DM control. She is also compliant with Farxiga 10 mg QD, Basaglar 20 units QD, Metformin 1000 mg BID and Januvia 100 mg QD. Her home CBGs ranges from 100 to 270. No hypoglycemic episodes.  HTN/HLD: She is compliant with her Hyzaar 100/25 QD and Crestor 20 QD. She need medication refills.  Vaginal itching: C/O vaginal itching since she used antibiotics for a dental procedure. Also has some discharges. She does not want a vaginal exam today but stated this is typical of her yeast infection. She tried OTC regimen with no improvement.  PERTINENT  PMH / PSH: PMH reviewed.  OBJECTIVE:   BP 131/80    Pulse 87    Ht 5\' 7"  (1.702 m)    Wt 167 lb 6.4 oz (75.9 kg)    SpO2 100%    BMI 26.22 kg/m   Physical Exam Vitals reviewed.  Cardiovascular:     Rate and Rhythm: Normal rate and regular rhythm.     Heart sounds: Normal heart sounds.  Pulmonary:     Effort: Pulmonary effort is normal. No respiratory distress.     Breath sounds: Normal breath sounds. No wheezing.  Abdominal:     General: Bowel sounds are normal. There is no distension.     Palpations: Abdomen is soft. There is no mass.     Tenderness: There is no abdominal tenderness.  Musculoskeletal:     Right lower leg: No edema.     Left lower leg: No edema.     ASSESSMENT/PLAN:   Diabetic retinopathy, nonproliferative ([place]) Eye exam completed 02/02/21 Ophthalmologist's note reviewed.  Diabetes mellitus with coincident hypertension (HCC) A1C worsened. I discussed Ozempic with her. However, given that she is on Januvia, we will hold off on starting for now. Increase Basagalr to 22 units QD. Monitor CBG closely and f/u as planned.  Essential hypertension BP looks good on her current regimen.  Mixed hyperlipidemia Stable on current regimen.   Vaginitis- she  declined pelvic exam. Diflucan prescribed for empirical treatment. F/U if no improvement.  Andrena Mews, MD [place]

## 2021-04-29 NOTE — Assessment & Plan Note (Signed)
A1C worsened. I discussed Ozempic with her. However, given that she is on Januvia, we will hold off on starting for now. Increase Basagalr to 22 units QD. Monitor CBG closely and f/u as planned.

## 2021-04-29 NOTE — Patient Instructions (Signed)
Semaglutide Injection °What is this medication? °SEMAGLUTIDE (SEM a GLOO tide) treats type 2 diabetes. It works by increasing insulin levels in your body, which decreases your blood sugar (glucose). It also reduces the amount of sugar released into the blood and slows down your digestion. It can also be used to lower the risk of heart attack and stroke in people with type 2 diabetes. Changes to diet and exercise are often combined with this medication. °This medicine may be used for other purposes; ask your health care provider or pharmacist if you have questions. °COMMON BRAND NAME(S): OZEMPIC °What should I tell my care team before I take this medication? °They need to know if you have any of these conditions: °Endocrine tumors (MEN 2) or if someone in your family had these tumors °Eye disease, vision problems °History of pancreatitis °Kidney disease °Stomach problems °Thyroid cancer or if someone in your family had thyroid cancer °An unusual or allergic reaction to semaglutide, other medications, foods, dyes, or preservatives °Pregnant or trying to get pregnant °Breast-feeding °How should I use this medication? °This medication is for injection under the skin of your upper leg (thigh), stomach area, or upper arm. It is given once every week (every 7 days). You will be taught how to prepare and give this medication. Use exactly as directed. Take your medication at regular intervals. Do not take it more often than directed. °If you use this medication with insulin, you should inject this medication and the insulin separately. Do not mix them together. Do not give the injections right next to each other. Change (rotate) injection sites with each injection. °It is important that you put your used needles and syringes in a special sharps container. Do not put them in a trash can. If you do not have a sharps container, call your pharmacist or care team to get one. °A special MedGuide will be given to you by the  pharmacist with each prescription and refill. Be sure to read this information carefully each time. °This medication comes with INSTRUCTIONS FOR USE. Ask your pharmacist for directions on how to use this medication. Read the information carefully. Talk to your pharmacist or care team if you have questions. °Talk to your care team about the use of this medication in children. Special care may be needed. °Overdosage: If you think you have taken too much of this medicine contact a poison control center or emergency room at once. °NOTE: This medicine is only for you. Do not share this medicine with others. °What if I miss a dose? °If you miss a dose, take it as soon as you can within 5 days after the missed dose. Then take your next dose at your regular weekly time. If it has been longer than 5 days after the missed dose, do not take the missed dose. Take the next dose at your regular time. Do not take double or extra doses. If you have questions about a missed dose, contact your care team for advice. °What may interact with this medication? °Other medications for diabetes °Many medications may cause changes in blood sugar, these include: °Alcohol containing beverages °Antiviral medications for HIV or AIDS °Aspirin and aspirin-like medications °Certain medications for blood pressure, heart disease, irregular heart beat °Chromium °Diuretics °Female hormones, such as estrogens or progestins, birth control pills °Fenofibrate °Gemfibrozil °Isoniazid °Lanreotide °Female hormones or anabolic steroids °MAOIs like Carbex, Eldepryl, Marplan, Nardil, and Parnate °Medications for weight loss °Medications for allergies, asthma, cold, or cough °Medications for depression,   anxiety, or psychotic disturbances °Niacin °Nicotine °NSAIDs, medications for pain and inflammation, like ibuprofen or naproxen °Octreotide °Pasireotide °Pentamidine °Phenytoin °Probenecid °Quinolone antibiotics such as ciprofloxacin, levofloxacin, ofloxacin °Some  herbal dietary supplements °Steroid medications such as prednisone or cortisone °Sulfamethoxazole; trimethoprim °Thyroid hormones °Some medications can hide the warning symptoms of low blood sugar (hypoglycemia). You may need to monitor your blood sugar more closely if you are taking one of these medications. These include: °Beta-blockers, often used for high blood pressure or heart problems (examples include atenolol, metoprolol, propranolol) °Clonidine °Guanethidine °Reserpine °This list may not describe all possible interactions. Give your health care provider a list of all the medicines, herbs, non-prescription drugs, or dietary supplements you use. Also tell them if you smoke, drink alcohol, or use illegal drugs. Some items may interact with your medicine. °What should I watch for while using this medication? °Visit your care team for regular checks on your progress. °Drink plenty of fluids while taking this medication. Check with your care team if you get an attack of severe diarrhea, nausea, and vomiting. The loss of too much body fluid can make it dangerous for you to take this medication. °A test called the HbA1C (A1C) will be monitored. This is a simple blood test. It measures your blood sugar control over the last 2 to 3 months. You will receive this test every 3 to 6 months. °Learn how to check your blood sugar. Learn the symptoms of low and high blood sugar and how to manage them. °Always carry a quick-source of sugar with you in case you have symptoms of low blood sugar. Examples include hard sugar candy or glucose tablets. Make sure others know that you can choke if you eat or drink when you develop serious symptoms of low blood sugar, such as seizures or unconsciousness. They must get medical help at once. °Tell your care team if you have high blood sugar. You might need to change the dose of your medication. If you are sick or exercising more than usual, you might need to change the dose of your  medication. °Do not skip meals. Ask your care team if you should avoid alcohol. Many nonprescription cough and cold products contain sugar or alcohol. These can affect blood sugar. °Pens should never be shared. Even if the needle is changed, sharing may result in passing of viruses like hepatitis or HIV. °Wear a medical ID bracelet or chain, and carry a card that describes your disease and details of your medication and dosage times. °Do not become pregnant while taking this medication. Women should inform their care team if they wish to become pregnant or think they might be pregnant. There is a potential for serious side effects to an unborn child. Talk to your care team for more information. °What side effects may I notice from receiving this medication? °Side effects that you should report to your care team as soon as possible: °Allergic reactions--skin rash, itching, hives, swelling of the face, lips, tongue, or throat °Change in vision °Dehydration--increased thirst, dry mouth, feeling faint or lightheaded, headache, dark yellow or brown urine °Gallbladder problems--severe stomach pain, nausea, vomiting, fever °Heart palpitations--rapid, pounding, or irregular heartbeat °Kidney injury--decrease in the amount of urine, swelling of the ankles, hands, or feet °Pancreatitis--severe stomach pain that spreads to your back or gets worse after eating or when touched, fever, nausea, vomiting °Thyroid cancer--new mass or lump in the neck, pain or trouble swallowing, trouble breathing, hoarseness °Side effects that usually do not require medical   attention (report to your care team if they continue or are bothersome): °Diarrhea °Loss of appetite °Nausea °Stomach pain °Vomiting °This list may not describe all possible side effects. Call your doctor for medical advice about side effects. You may report side effects to FDA at 1-800-FDA-1088. °Where should I keep my medication? °Keep out of the reach of children. °Store  unopened pens in a refrigerator between 2 and 8 degrees C (36 and 46 degrees F). Do not freeze. Protect from light and heat. After you first use the pen, it can be stored for 56 days at room temperature between 15 and 30 degrees C (59 and 86 degrees F) or in a refrigerator. Throw away your used pen after 56 days or after the expiration date, whichever comes first. °Do not store your pen with the needle attached. If the needle is left on, medication may leak from the pen. °NOTE: This sheet is a summary. It may not cover all possible information. If you have questions about this medicine, talk to your doctor, pharmacist, or health care provider. °© 2022 [employer] (2020-08-05 00:00:00) ° °

## 2021-06-12 ENCOUNTER — Other Ambulatory Visit: Payer: Self-pay | Admitting: Family Medicine

## 2021-06-13 ENCOUNTER — Telehealth: Payer: Self-pay | Admitting: Family Medicine

## 2021-06-13 NOTE — Telephone Encounter (Signed)
HIPAA compliant callback message left.    During her last visit in Dec, her A1C worsened and we discussed adding Ozempic. However, given that she is already Januvia. Plan to increase Basaglar to 22 units.  I will attempt to reach her again to discuss medication management.

## 2021-08-02 ENCOUNTER — Other Ambulatory Visit: Payer: Self-pay | Admitting: Family Medicine

## 2021-08-26 ENCOUNTER — Ambulatory Visit: Payer: Managed Care, Other (non HMO) | Admitting: Family Medicine

## 2021-08-26 ENCOUNTER — Encounter: Payer: Self-pay | Admitting: Family Medicine

## 2021-08-26 VITALS — BP 137/71 | HR 85 | Ht 66.0 in | Wt 157.6 lb

## 2021-08-26 DIAGNOSIS — R634 Abnormal weight loss: Secondary | ICD-10-CM

## 2021-08-26 DIAGNOSIS — I10 Essential (primary) hypertension: Secondary | ICD-10-CM

## 2021-08-26 DIAGNOSIS — E782 Mixed hyperlipidemia: Secondary | ICD-10-CM

## 2021-08-26 DIAGNOSIS — E119 Type 2 diabetes mellitus without complications: Secondary | ICD-10-CM

## 2021-08-26 DIAGNOSIS — R200 Anesthesia of skin: Secondary | ICD-10-CM

## 2021-08-26 DIAGNOSIS — E1169 Type 2 diabetes mellitus with other specified complication: Secondary | ICD-10-CM | POA: Diagnosis not present

## 2021-08-26 DIAGNOSIS — I700 Atherosclerosis of aorta: Secondary | ICD-10-CM

## 2021-08-26 LAB — POCT GLYCOSYLATED HEMOGLOBIN (HGB A1C): HbA1c, POC (controlled diabetic range): 6.8 % (ref 0.0–7.0)

## 2021-08-26 MED ORDER — OYSTER SHELL CALCIUM/D3 500-5 MG-MCG PO TABS
1.0000 | ORAL_TABLET | Freq: Two times a day (BID) | ORAL | 99 refills | Status: DC
Start: 2021-08-26 — End: 2022-03-10

## 2021-08-26 NOTE — Assessment & Plan Note (Signed)
Counseling provided. ?Continue ASA and Crestor.  ?

## 2021-08-26 NOTE — Assessment & Plan Note (Signed)
Stable on current regimen   

## 2021-08-26 NOTE — Assessment & Plan Note (Signed)
A1C of 6.8 today. ?Given no hx of hypoglycemia, we will continue her current DM regimen. ?I did encourage adequate calorie intake. ?She will contact me if she experiences hypoglycemia. ?Bmet and FLP checked.  ?F/U in 3-4 months. ? ?

## 2021-08-26 NOTE — Assessment & Plan Note (Signed)
Intentional. ?She is up to date with her cancer screening. ?I discussed healthy eating habit. ?Monitor closely for now. ? ?

## 2021-08-26 NOTE — Patient Instructions (Signed)
Calcium; Vitamin D Tablets ?What is this medication? ?CALCIUM; VITAMIN D (KAL see um; VAHY tuh min D) prevents and treats low calcium and vitamin D levels in your body. Vitamin D and calcium help build and maintain the health of your bones. Vitamin D also plays an important role in supporting your immune system and brain health. ?This medicine may be used for other purposes; ask your health care provider or pharmacist if you have questions. ?COMMON BRAND NAME(S): Calcarb 600 with Vitamin D, Calcet Petites, Calcet Plus Vitamin D, Calcitrate + D, Calcium Citrate + D3 Maximum, Calcium Citrate Maximum with D, Caltrate, Caltrate 600+D, Caltrate Colon Health, Citracal + D, Citracal MAXIMUM + D, Citracal Petites with Vitamin D, Citrus Calcium Plus D, OSCAL 500 + D, OSCAL Calcium + D3, OSCAL Extra D3, Osteo-Poretical, Oysco 500 + D, Oysco D, Oystercal-D Calcium ?What should I tell my care team before I take this medication? ?They need to know if you have any of these conditions: ?Constipation ?Dehydration ?Heart disease ?High level of calcium or vitamin D in the blood ?High level of phosphate in the blood ?Kidney disease ?Kidney stones ?Liver disease ?Parathyroid disease ?Sarcoidosis ?Stomach ulcer or obstruction ?An unusual or allergic reaction to calcium, vitamin D, tartrazine dye, other medications, foods, dyes, or preservatives ?Pregnant or trying to get pregnant ?Breast-feeding ?How should I use this medication? ?Take this medication by mouth with water. Take it as directed on the prescription label at the same time every day. Take it with food or within 1 hour after a meal. Keep taking it unless your care team tells you to stop. ?This product contains calcium. It should be taken at a different time of day than some other medications taken by mouth. Talk to your care team if you are not sure if you should separate the timing of your medications. ?Talk to your care team about the use of this medication in children.  Special care may be needed. ?Overdosage: If you think you have taken too much of this medicine contact a poison control center or emergency room at once. ?NOTE: This medicine is only for you. Do not share this medicine with others. ?What if I miss a dose? ?If you miss a dose, take it as soon as you can. If it is almost time for your next dose, take only that dose. Do not take double or extra doses. ?What may interact with this medication? ?Antibiotics like ciprofloxacin, levofloxacin, tetracycline ?Digoxin ?Diuretics ?Iron supplements ?Medications for fungal infections like fluconazole, itraconazole, ketoconazole ?Mineral oil ?Other supplements with calcium, vitamin D, or minerals ?Phenytoin ?Quinidine ?Sucralfate ?Thyroid medication ?This list may not describe all possible interactions. Give your health care provider a list of all the medicines, herbs, non-prescription drugs, or dietary supplements you use. Also tell them if you smoke, drink alcohol, or use illegal drugs. Some items may interact with your medicine. ?What should I watch for while using this medication? ?Taking this medication is not a substitute for a well-balanced diet and exercise. Talk with your care team and follow a healthy lifestyle. ?Do not take any non-prescription medications that have vitamin D, phosphorus, magnesium, or calcium, including antacids, while taking this medication, unless your care team says you can. The extra supplements can cause side effects. ?What side effects may I notice from receiving this medication? ?Side effects that you should report to your care team as soon as possible: ?Allergic reactions--skin rash, itching, hives, swelling of the face, lips, tongue, or throat ?High calcium level--increased thirst  or amount of urine, nausea, vomiting, confusion, unusual weakness or fatigue, bone pain ?Side effects that usually do not require medical attention (report to your care team if they continue or are  bothersome): ?Constipation ?Loss of appetite ?Nausea ?Upset stomach ?Vomiting ?This list may not describe all possible side effects. Call your doctor for medical advice about side effects. You may report side effects to FDA at 1-800-FDA-1088. ?Where should I keep my medication? ?Keep out of the reach of children and pets. ?Store at room temperature between 20 and 25 degrees C (68 and 77 degrees F). Get rid of any unused medication after the expiration date. ?To get rid of medications that are no longer needed or have expired: ?Take the medication to a medication take-back program. Check with your pharmacy or law enforcement to find a location. ?If you cannot return the medication, check the label or package insert to see if the medication should be thrown out in the garbage or flushed down the toilet. If you are not sure, ask your care team. If it is safe to put it in the trash, pour the medication out of the container. Mix the medication with cat litter, dirt, coffee grounds, or other unwanted substance. Seal the mixture in a bag or container. Put it in the trash. ?NOTE: This sheet is a summary. It may not cover all possible information. If you have questions about this medicine, talk to your doctor, pharmacist, or health care provider. ?? 2023 Elsevier/Gold Standard (2021-04-21 00:00:00) ? ?

## 2021-08-26 NOTE — Assessment & Plan Note (Signed)
FLP checked. 

## 2021-08-26 NOTE — Progress Notes (Signed)
? ? ?  SUBJECTIVE:  ? ?CHIEF COMPLAINT / HPI:  ? ?DM2/HLD/HTN:  ?She is here for a follow-up. She is compliant with Farxiga 10 mg QD, Januvia 100 mg QD, Metformin 1000 mg BID and Basaglar 22 units QD for her DM. Her home CBG ranges from 78 lowest to 203 highest. No hypoglycemic episodes. She takes Cestor 20 mg QD for her HLD and Hyzaar 100/25 QD for HTN. No other concerns. ? ?Atherosclerosis: Compliant with ASA and Crestor 20 mg QD. ? ?Laft hand:  ?C/O left hand numbness when ever she wakes up x 3 days. Numbness will resolve after she shakes her hand. No sensory loss. ? ?Intentional weight loss: ?She cut back extremely on her daily calories for adequate DM control ? ? ?PERTINENT  PMH / PSH: PMHx reviewed. ? ?OBJECTIVE:  ? ?Today's Vitals  ? 08/26/21 0840  ?BP: 137/71  ?Pulse: 85  ?SpO2: 100%  ?Weight: 157 lb 9.6 oz (71.5 kg)  ?Height: '5\' 6"'$  (1.676 m)  ?PainSc: 0-No pain  ? ?Body mass index is 25.44 kg/m?. ? ?Physical Exam ?Vitals and nursing note reviewed.  ?Cardiovascular:  ?   Rate and Rhythm: Normal rate and regular rhythm.  ?   Heart sounds: Normal heart sounds. No murmur heard. ?Pulmonary:  ?   Effort: Pulmonary effort is normal. No respiratory distress.  ?   Breath sounds: Normal breath sounds. No wheezing.  ?Musculoskeletal:  ?   Right lower leg: No edema.  ?   Left lower leg: No edema.  ?Neurological:  ?   General: No focal deficit present.  ?   Mental Status: She is oriented to person, place, and time.  ? ? ? ?ASSESSMENT/PLAN:  ? ?Diabetes mellitus with coincident hypertension ([place]) ?A1C of 6.8 today. ?Given no hx of hypoglycemia, we will continue her current DM regimen. ?I did encourage adequate calorie intake. ?She will contact me if she experiences hypoglycemia. ?Bmet and FLP checked.  ?F/U in 3-4 months. ? ? ?Essential hypertension ?Stable on current regimen. ? ?Mixed hyperlipidemia ?FLP checked.  ? ?Aortic atherosclerosis ([place]) ?Counseling provided. ?Continue ASA and Crestor.  ? ?Weight  loss ?Intentional. ?She is up to date with her cancer screening. ?I discussed healthy eating habit. ?Monitor closely for now. ? ?  ?Left hand intermittent numbness  ?Likely compression syndrome from sleeping on it vs CTS. ?CTS unlikely since the whole hand gets numb. ?She is currently asymptomatic. ?Monitor for now and consider EMG in the future. ?She agreed with the plan. ? ? ?Andrena Mews, MD ?[place]  ? ?

## 2021-08-27 LAB — BASIC METABOLIC PANEL
BUN/Creatinine Ratio: 28 (ref 12–28)
BUN: 26 mg/dL (ref 8–27)
CO2: 25 mmol/L (ref 20–29)
Calcium: 9.8 mg/dL (ref 8.7–10.3)
Chloride: 101 mmol/L (ref 96–106)
Creatinine, Ser: 0.94 mg/dL (ref 0.57–1.00)
Glucose: 133 mg/dL — ABNORMAL HIGH (ref 70–99)
Potassium: 4.3 mmol/L (ref 3.5–5.2)
Sodium: 140 mmol/L (ref 134–144)
eGFR: 67 mL/min/{1.73_m2} (ref >59)

## 2021-08-27 LAB — LIPID PANEL
Chol/HDL Ratio: 1.9 ratio (ref 0.0–4.4)
Cholesterol, Total: 136 mg/dL (ref 100–199)
HDL: 71 mg/dL (ref >39)
LDL Chol Calc (NIH): 55 mg/dL (ref 0–99)
Triglycerides: 43 mg/dL (ref 0–149)
VLDL Cholesterol Cal: 10 mg/dL (ref 5–40)

## 2021-08-29 ENCOUNTER — Encounter: Payer: Self-pay | Admitting: Family Medicine

## 2021-08-29 ENCOUNTER — Telehealth: Payer: Self-pay | Admitting: Family Medicine

## 2021-08-29 NOTE — Telephone Encounter (Signed)
HIPAA compliant callback message left. ? ? ?Please, advise her that her blood test looks good. F/U as planned. ?

## 2021-10-06 ENCOUNTER — Telehealth: Payer: Self-pay

## 2021-10-06 NOTE — Telephone Encounter (Signed)
Patient calls nurse line reporting boils x3.  Patient reports they are located on her stomach and have been present for ~ 1 week. Patient reports they are very painful and have been draining purulent discharge.   Patient reports she has been doing warm compresses and using antibacterial ointment.   Patient denies any fevers, chills or body aches at this time.   Patient scheduled for tomorrow for evaluation.

## 2021-10-07 ENCOUNTER — Encounter: Payer: Self-pay | Admitting: Family Medicine

## 2021-10-07 ENCOUNTER — Other Ambulatory Visit: Payer: Self-pay

## 2021-10-07 ENCOUNTER — Ambulatory Visit: Payer: Managed Care, Other (non HMO) | Admitting: Family Medicine

## 2021-10-07 VITALS — BP 120/64 | HR 97 | Wt 162.6 lb

## 2021-10-07 DIAGNOSIS — L0291 Cutaneous abscess, unspecified: Secondary | ICD-10-CM | POA: Insufficient documentation

## 2021-10-07 DIAGNOSIS — Z7689 Persons encountering health services in other specified circumstances: Secondary | ICD-10-CM

## 2021-10-07 MED ORDER — DOXYCYCLINE HYCLATE 100 MG PO TABS: 100.0000 mg | ORAL_TABLET | Freq: Two times a day (BID) | ORAL | 0 refills | Status: DC

## 2021-10-07 NOTE — Assessment & Plan Note (Addendum)
Question whether patient has HS given her long history of recurring abscesses. Her abdominal 2 x 2 centimeter abscess was incised and drained today.  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 Return precautions provided.

## 2021-10-07 NOTE — Progress Notes (Cosign Needed Addendum)
    SUBJECTIVE:   CHIEF COMPLAINT / HPI:   Boils Patient presents today to discuss boils on her abdomen present for the last week.  Endorses pain and purulent discharge from them.  She has been using warm compresses and using benzocaine OTC ointment for pain relief.  Denies any fevers, chills, body aches.  She states that she is dealt with abscesses since the age of 66.  She has had them on her axilla, groin, abdomen.  They tend to occur every 2 weeks.  She is normally able to handle them at home but does note that in the past she has had MRSA.   PERTINENT  PMH / PSH:  Past Medical History:  Diagnosis Date   Abnormal vaginal bleeding 07/29/2013   Anemia 07/12/2006   09/12/11: Anemia panel not suggestive of iron or B12 deficiency.  CBC with diff shows no pancytopenia.  Colonoscopy 2008 and referral in 2010 for heme positive stools not suggestive of need for further evaluation.  Most likely due to tamoxifen.    Arthritis    Diabetes mellitus    type 2   Ganglion cyst of wrist 10/14/2013   Hypertension    KNEE MENISCUS INJURY, UNSPECIFIED 07/12/2006   Qualifier: History of  By: Carmie End MD, Erin      OBJECTIVE:   BP 120/64   Pulse 97   Wt 162 lb 9.6 oz (73.8 kg)   SpO2 100%   BMI 26.24 kg/m    General: NAD, pleasant, able to participate in exam Respiratory: Normal respiratory effort Skin: warm and dry, approximately 2x2 cm hard and fluctuant abscess to LUQ with two white heads, smaller 1 cm circular abscess to LLQ/flank area.  Psych: Normal affect and mood       ASSESSMENT/PLAN:   Abscess Question whether patient has HS given her long history of recurring abscesses. Her abdominal 2 x 2 centimeter abscess was incised and drained today.  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 Return precautions provided.   Diagnosis: abscess - Location: LUQ Abdomen Procedure: Incision & drainage Informed consent:  Discussed risks (permanent loss of nail,  permanent irregular growth of nail, infection, pain, bleeding, bruising, numbness, and recurrence of the condition) and benefits of the procedure, as well as the alternatives.  Informed consent was obtained. Anesthesia: 1% lidocaine with epi Type: partial The area was prepared and draped in a standard fashion. The lesion drained pus and blood. The patient tolerated the procedure well. The patient was instructed on post-op care.   Sharion Settler, [place]

## 2021-10-07 NOTE — Patient Instructions (Addendum)
It was wonderful to see you today.  Today we talked about:  -I was able to drain the abscess on your abdomen. -You may feel some soreness from all the pressure needed for drainage, you can take Tylenol or ibuprofen for the pain. -I am sending antibiotics for you to take.  Take it twice a day for 10 days. -If you develop fevers, streaking redness, worsening pain, please return.   Thank you for choosing [facility].   Please call [phone] with any questions about today's appointment.  Please be sure to schedule follow up at the front  desk before you leave today.   Sharion Settler, DO PGY-2 Family Medicine

## 2021-10-13 ENCOUNTER — Other Ambulatory Visit: Payer: Self-pay | Admitting: Family Medicine

## 2021-10-18 ENCOUNTER — Encounter: Payer: Self-pay | Admitting: *Deleted

## 2021-10-20 ENCOUNTER — Encounter (INDEPENDENT_AMBULATORY_CARE_PROVIDER_SITE_OTHER): Payer: Self-pay

## 2021-10-29 ENCOUNTER — Other Ambulatory Visit: Payer: Self-pay | Admitting: Family Medicine

## 2021-12-02 ENCOUNTER — Encounter: Payer: Self-pay | Admitting: Family Medicine

## 2021-12-02 ENCOUNTER — Ambulatory Visit: Payer: Managed Care, Other (non HMO) | Admitting: Family Medicine

## 2021-12-02 VITALS — BP 133/83 | HR 76 | Ht 66.0 in | Wt 161.8 lb

## 2021-12-02 DIAGNOSIS — E113299 Type 2 diabetes mellitus with mild nonproliferative diabetic retinopathy without macular edema, unspecified eye: Secondary | ICD-10-CM

## 2021-12-02 DIAGNOSIS — Z23 Encounter for immunization: Secondary | ICD-10-CM | POA: Diagnosis not present

## 2021-12-02 DIAGNOSIS — E1169 Type 2 diabetes mellitus with other specified complication: Secondary | ICD-10-CM

## 2021-12-02 DIAGNOSIS — I10 Essential (primary) hypertension: Secondary | ICD-10-CM

## 2021-12-02 DIAGNOSIS — I700 Atherosclerosis of aorta: Secondary | ICD-10-CM

## 2021-12-02 DIAGNOSIS — E119 Type 2 diabetes mellitus without complications: Secondary | ICD-10-CM | POA: Diagnosis not present

## 2021-12-02 LAB — POCT GLYCOSYLATED HEMOGLOBIN (HGB A1C): HbA1c, POC (controlled diabetic range): 6.5 % (ref 0.0–7.0)

## 2021-12-02 MED ORDER — METFORMIN HCL 500 MG PO TABS: 500.0000 mg | ORAL_TABLET | Freq: Two times a day (BID) | ORAL | 1 refills | Status: DC

## 2021-12-02 NOTE — Assessment & Plan Note (Signed)
Stable on current regimen   

## 2021-12-02 NOTE — Progress Notes (Signed)
    SUBJECTIVE:   CHIEF COMPLAINT / HPI:   DM2: She is compliant with Basaglar 22 units QD, Januvia 100 mg QD and Metformin 1000 mg BID. She has been out of [place] for one week since it is out of stock. Her home CBG ranges from 60 to 121, mostly in the one-teen. She denies hypoglycemic symptoms such as diaphoresis or dizziness. Occasionally she will have a tingling sensation in her hand if sugar is low, and she will eat immediately. She is, otherwise, doing well. DM eye exam is scheduled fr Sept 21 st.  Aortic atherosclerosis: She denies chest pain or SOB. She is compliant with Crestor 20 mg QD and baby ASA.   PERTINENT  PMH / PSH: PMHx reviewed.  OBJECTIVE:   BP 133/83   Pulse 76   Ht '5\' 6"'$  (1.676 m)   Wt 161 lb 12.8 oz (73.4 kg)   SpO2 100%   BMI 26.12 kg/m   Physical Exam Vitals and nursing note reviewed.  Cardiovascular:     Rate and Rhythm: Normal rate and regular rhythm.     Heart sounds: Normal heart sounds. No murmur heard. Pulmonary:     Effort: Pulmonary effort is normal. No respiratory distress.     Breath sounds: Normal breath sounds. No wheezing.  Abdominal:     General: Abdomen is flat. There is no distension.     Palpations: There is no mass.     Tenderness: There is no abdominal tenderness.  Musculoskeletal:     Right lower leg: No edema.     Left lower leg: No edema.      ASSESSMENT/PLAN:   Diabetes mellitus with coincident hypertension (HCC) A1C looks good today Reduce Metformin to 500 mg BID Continue current dose of Insulin and Januvia Pick up [place] when available. Continue close CBG monitoring at home. Goal is approximately 100 - 160 F/U in 3 months for reassessment or sooner if having any concerns.  Aortic atherosclerosis (HCC) Stable on current regimen.   Diabetic retinopathy, nonproliferative ([place]) Eye exam in Sept.      Andrena Mews, MD [place]

## 2021-12-02 NOTE — Assessment & Plan Note (Signed)
Eye exam in Sept.

## 2021-12-02 NOTE — Assessment & Plan Note (Signed)
A1C looks good today Reduce Metformin to 500 mg BID Continue current dose of Insulin and Januvia Pick up [place] when available. Continue close CBG monitoring at home. Goal is approximately 100 - 160 F/U in 3 months for reassessment or sooner if having any concerns.

## 2021-12-02 NOTE — Patient Instructions (Addendum)
  It was nice seeing you today. Your A1C looks beautiful. Please, reduce your metformin to 1000 mg daily. I will send in a new prescript to reflect this change. See me back in 3 months.

## 2021-12-27 ENCOUNTER — Other Ambulatory Visit: Payer: Self-pay | Admitting: Family Medicine

## 2021-12-27 DIAGNOSIS — Z1231 Encounter for screening mammogram for malignant neoplasm of breast: Secondary | ICD-10-CM

## 2021-12-29 ENCOUNTER — Other Ambulatory Visit: Payer: Self-pay | Admitting: Family Medicine

## 2022-01-16 ENCOUNTER — Other Ambulatory Visit: Payer: Self-pay | Admitting: Family Medicine

## 2022-01-17 MED ORDER — METFORMIN HCL 500 MG PO TABS
500.0000 mg | ORAL_TABLET | Freq: Two times a day (BID) | ORAL | 1 refills | Status: DC
Start: 2022-01-17 — End: 2022-02-06

## 2022-01-20 ENCOUNTER — Ambulatory Visit: Payer: Managed Care, Other (non HMO) | Attending: Internal Medicine | Admitting: Internal Medicine

## 2022-01-20 ENCOUNTER — Encounter: Payer: Self-pay | Admitting: Internal Medicine

## 2022-01-20 VITALS — BP 118/72 | HR 68 | Ht 67.0 in | Wt 158.8 lb

## 2022-01-20 DIAGNOSIS — E119 Type 2 diabetes mellitus without complications: Secondary | ICD-10-CM | POA: Diagnosis not present

## 2022-01-20 DIAGNOSIS — E1169 Type 2 diabetes mellitus with other specified complication: Secondary | ICD-10-CM

## 2022-01-20 DIAGNOSIS — I2510 Atherosclerotic heart disease of native coronary artery without angina pectoris: Secondary | ICD-10-CM | POA: Diagnosis not present

## 2022-01-20 DIAGNOSIS — I10 Essential (primary) hypertension: Secondary | ICD-10-CM

## 2022-01-20 DIAGNOSIS — R079 Chest pain, unspecified: Secondary | ICD-10-CM

## 2022-01-20 DIAGNOSIS — I700 Atherosclerosis of aorta: Secondary | ICD-10-CM | POA: Diagnosis not present

## 2022-01-20 DIAGNOSIS — E785 Hyperlipidemia, unspecified: Secondary | ICD-10-CM | POA: Diagnosis not present

## 2022-01-20 MED ORDER — NITROGLYCERIN 0.4 MG SL SUBL: 0.4000 mg | SUBLINGUAL_TABLET | SUBLINGUAL | 3 refills | Status: AC | PRN

## 2022-01-20 NOTE — Progress Notes (Signed)
Cardiology Office Note:    Date:  01/20/2022   ID:  Kathy Duncan, DOB March 22, 1956, MRN [id number]  PCP:  Kinnie Feil, MD  [facility] Cardiologist:  Werner Lean, MD  [place] Electrophysiologist:  None   CC: Follow up HLD  History of Present Illness:    Kathy Duncan is a 66 y.o. female with a hx of Diabetes with Hypertension, HLD, who presents for evaluation 04/13/20.   2022: In interval had CCTA with mild non-obstructive disease.   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 2023: started Farixga for DM  Patient notes that she is doing well.   Since last visit notes that she is still at [employer].  No heavy lifting. There are no interval hospital/ED visit.    No chest pain or pressure .  No SOB/DOE and no PND/Orthopnea.  No weight gain or leg swelling.  No palpitations or syncope.  Work takes a significant toll and she feels fatigue.   Past Medical History:  Diagnosis Date   Abnormal vaginal bleeding 07/29/2013   Anemia 07/12/2006   09/12/11: Anemia panel not suggestive of iron or B12 deficiency.  CBC with diff shows no pancytopenia.  Colonoscopy 2008 and referral in 2010 for heme positive stools not suggestive of need for further evaluation.  Most likely due to tamoxifen.    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 Swallow whole.   BD PEN NEEDLE NANO 2ND GEN 32G X 4 MM MISC USE TO CHECK CAPILLARY GLUCOSE 3 TIMES A DAY   calcium-vitamin D (OSCAL WITH D) 500-5 MG-MCG tablet Take 1 tablet by mouth 2 (two) times daily.   cholecalciferol (VITAMIN D3) 25 MCG (1000 UNIT) tablet Take 1,000 Units by mouth daily.   FARXIGA 10 MG TABS tablet TAKE 1 TABLET BY MOUTH EVERY DAY   ferrous sulfate 325 (65 FE) MG tablet Take 1 tablet (325 mg total) by mouth daily with breakfast.   glucose blood (CONTOUR TEST) test strip Use to check CBG TID   Insulin Glargine (BASAGLAR KWIKPEN) 100 UNIT/ML INJECT 22 UNITS INTO THE SKIN DAILY.( MUST LAST 68 DAY)   Insulin Pen Needle (B-D ULTRAFINE III SHORT PEN) 31G X 8 MM MISC Use to check capillary glucose TID   Insulin Syringe-Needle U-100 (B-D INS SYR ULTRAFINE 1CC/30G) 30G X 1/2" 1 ML MISC Use to check capillary glucose TID   losartan-hydrochlorothiazide (HYZAAR) 100-25 MG tablet TAKE 1 TABLET BY MOUTH EVERY DAY   metFORMIN (GLUCOPHAGE) 500 MG tablet Take 1 tablet (500 mg total) by mouth 2 (two) times daily with a meal.   OneTouch Delica Lancets 32R MISC Use to check CBG TID   rosuvastatin (CRESTOR) 20 MG tablet Take 1 tablet (20 mg total)  by mouth daily.   sitaGLIPtin (JANUVIA) 100 MG tablet Take 1 tablet (100 mg total) by mouth daily.   [DISCONTINUED] nitroGLYCERIN (NITROSTAT) 0.4 MG SL tablet Place 1 tablet (0.4 mg total) under the tongue every 5 (five) minutes as needed for chest pain.     Allergies:   Ace inhibitors   Social History   Socioeconomic History   Marital status: Married    Spouse name: Not on file   Number of children: Not on file   Years of education: Not on file   Highest education level: Not on file  Occupational History   Not on file  Tobacco Use   Smoking status: Never   Smokeless tobacco: Never  Vaping Use   Vaping Use: Never used  Substance and Sexual Activity   Alcohol use: Yes   Drug use: No   Sexual  activity: Not Currently    Comment: 1st intercourse 66 yo-Fewer than 5 partners  Other Topics Concern   Not on file  Social History Narrative   Works at [employer], does some heavy lifting.  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   Social Determinants of Health   Financial Resource Strain: Not on file  Food Insecurity: Not on file  Transportation Needs: Not on file  Physical Activity: Not on file  Stress: Not on file  Social Connections: Not on file    Family History: The patient's family history includes Cancer in her brother; Heart disease in her mother and sister; Stroke in her father.  Mother- CABG at age 62 Sister - CABG at age 57  ROS:   Please see the history of present illness.    All other systems reviewed and are negative.  EKGs/Labs/Other Studies Reviewed:    The following studies were reviewed today:  EKG:   01/20/22: SR 03/25/20:  SR rate 63, no ST/T changes  Cardiac CT: Date: 05/27/2020 Results: IMPRESSION: 1. Coronary calcium score of 43.7. This was 80th percentile for age, sex, and race matched control.   2. Normal coronary origin with right dominance.   3. CAD-RADS 2. Mild non-obstructive CAD (25-49%). Consider non-atherosclerotic causes of chest pain. Consider preventive therapy and risk factor modification.   4. Aortic atherosclerosis noted.   Recent Labs: 08/26/2021: BUN 26; Creatinine, Ser 0.94; Potassium 4.3; Sodium 140  Recent Lipid Panel    Component Value Date/Time   CHOL 136 08/26/2021 1152   TRIG 43 08/26/2021 1152   HDL 71 08/26/2021 1152   CHOLHDL 1.9 08/26/2021 1152   CHOLHDL 2.3 12/28/2015 1121   VLDL 18 12/28/2015 1121   LDLCALC 55 08/26/2021 1152    Physical Exam:    VS:  BP 118/72   Pulse 68   Ht '5\' 7"'$  (1.702 m)   Wt 158 lb 12.8 oz (72 kg)   SpO2 98%   BMI 24.87 kg/m     Wt Readings from Last 3 Encounters:  01/20/22 158 lb 12.8 oz (72 kg)  12/02/21 161 lb 12.8 oz (73.4 kg)  10/07/21 162 lb 9.6 oz (73.8 kg)    GEN: Well nourished, well developed in no acute distress HEENT: Normal NECK: No JVD no carotid bruit LYMPHATICS: No lymphadenopathy CARDIAC: RRR, no murmurs, rubs, gallops  RESPIRATORY:  Clear to auscultation without rales, wheezing or rhonchi  ABDOMEN: Soft, non-tender, non-distended MUSCULOSKELETAL:  No edema; No deformity  SKIN: Warm and dry NEUROLOGIC:  Alert and oriented x 3 PSYCHIATRIC:  Normal affect   ASSESSMENT:    1. Coronary artery disease  involving native coronary artery of native heart without angina pectoris   2. Diabetes mellitus with coincident hypertension ([place])   3. Hyperlipidemia associated with type 2 diabetes mellitus ([place])   4. Aortic atherosclerosis (HCC)   5. Chest pain, unspecified type     PLAN:    Mild non-obstructive CAD Aortic atherosclerosis HLD with DM HTN with DM - ASA 81 mg PO Daily -LDL goal less than 70 (at goal) - on rosuvastatin 20 mg PO Daily - on losartan HCTZ 100-25 mg - will refill PRN Nitro (has never needed) - we discussed the role of exercise in CAD in females; she will get back to aerobic water swimming (exercise in the water) at then [place] center next week.  Offered PRN follow up; she would prefer one year with me; will do one with me   Medication Adjustments/Labs and Tests Ordered: Current medicines are reviewed at length with the patient today.  Concerns regarding medicines are outlined above.  Orders Placed This Encounter  Procedures   EKG 12-Lead    Meds ordered this encounter  Medications   nitroGLYCERIN (NITROSTAT) 0.4 MG SL tablet    Sig: Place 1 tablet (0.4 mg total) under the tongue every 5 (five) minutes as needed for chest pain.    Dispense:  25 tablet    Refill:  3     Patient Instructions  Medication Instructions:  Your physician recommends that you continue on your current medications as directed. Please refer to the Current Medication list given to you today.  REFILLED: Nitroglycerin    *If you need a refill on your cardiac medications before your next appointment, please call your pharmacy*   Lab Work: NONE If you have labs (blood work) drawn today and your tests are completely normal, you will receive your results only by: [place] (if you have MyChart) OR A paper copy in the mail If you have any lab test that is abnormal or we need to change your treatment, we will call you to review the results.   Testing/Procedures: NONE   Follow-Up: At [facility], you and your health needs are our priority.  As part of our continuing mission to provide you with exceptional heart care, we have created designated Provider Care Teams.  These Care Teams include your primary Cardiologist (physician) and Advanced Practice Providers (APPs -  Physician Assistants and Nurse Practitioners) who all work together to provide you with the care you need, when you need it.   Your next appointment:   1 year(s)  The format for your next appointment:   In Person  Provider:   Werner Lean, MD     Important Information About Sugar         Signed, Werner Lean, MD  01/20/2022 8:44 AM    [place]

## 2022-01-20 NOTE — Patient Instructions (Signed)
Medication Instructions:  Your physician recommends that you continue on your current medications as directed. Please refer to the Current Medication list given to you today.  REFILLED: Nitroglycerin   *If you need a refill on your cardiac medications before your next appointment, please call your pharmacy*   Lab Work: NONE If you have labs (blood work) drawn today and your tests are completely normal, you will receive your results only by: [place] (if you have MyChart) OR A paper copy in the mail If you have any lab test that is abnormal or we need to change your treatment, we will call you to review the results.   Testing/Procedures: NONE   Follow-Up: At [facility], you and your health needs are our priority.  As part of our continuing mission to provide you with exceptional heart care, we have created designated Provider Care Teams.  These Care Teams include your primary Cardiologist (physician) and Advanced Practice Providers (APPs -  Physician Assistants and Nurse Practitioners) who all work together to provide you with the care you need, when you need it.   Your next appointment:   1 year(s)  The format for your next appointment:   In Person  Provider:   Werner Lean, MD     Important Information About Sugar

## 2022-01-24 ENCOUNTER — Other Ambulatory Visit: Payer: Self-pay | Admitting: Family Medicine

## 2022-01-29 ENCOUNTER — Other Ambulatory Visit: Payer: Self-pay | Admitting: Family Medicine

## 2022-02-02 ENCOUNTER — Encounter (INDEPENDENT_AMBULATORY_CARE_PROVIDER_SITE_OTHER): Payer: Self-pay | Admitting: Ophthalmology

## 2022-02-02 ENCOUNTER — Encounter (INDEPENDENT_AMBULATORY_CARE_PROVIDER_SITE_OTHER): Payer: Managed Care, Other (non HMO) | Admitting: Ophthalmology

## 2022-02-02 ENCOUNTER — Ambulatory Visit (INDEPENDENT_AMBULATORY_CARE_PROVIDER_SITE_OTHER): Payer: Managed Care, Other (non HMO) | Admitting: Ophthalmology

## 2022-02-02 ENCOUNTER — Ambulatory Visit
Admission: RE | Admit: 2022-02-02 | Discharge: 2022-02-02 | Disposition: A | Discharge status: Home / Self Care | Payer: Managed Care, Other (non HMO) | Source: Ambulatory Visit | Attending: Family Medicine | Admitting: Family Medicine

## 2022-02-02 ENCOUNTER — Encounter: Payer: Self-pay | Admitting: Family Medicine

## 2022-02-02 DIAGNOSIS — Z961 Presence of intraocular lens: Secondary | ICD-10-CM

## 2022-02-02 DIAGNOSIS — E113293 Type 2 diabetes mellitus with mild nonproliferative diabetic retinopathy without macular edema, bilateral: Secondary | ICD-10-CM | POA: Diagnosis not present

## 2022-02-02 DIAGNOSIS — H43813 Vitreous degeneration, bilateral: Secondary | ICD-10-CM

## 2022-02-02 DIAGNOSIS — E103292 Type 1 diabetes mellitus with mild nonproliferative diabetic retinopathy without macular edema, left eye: Secondary | ICD-10-CM

## 2022-02-02 DIAGNOSIS — Z1231 Encounter for screening mammogram for malignant neoplasm of breast: Secondary | ICD-10-CM

## 2022-02-02 LAB — HM DIABETES EYE EXAM

## 2022-02-02 NOTE — Assessment & Plan Note (Signed)
Stable over time observe

## 2022-02-02 NOTE — Progress Notes (Signed)
02/02/2022     CHIEF COMPLAINT Patient presents for  Chief Complaint  Patient presents with   Diabetic Retinopathy without Macular Edema      HISTORY OF PRESENT ILLNESS: Kathy Duncan is a 66 y.o. female who presents to the clinic today for:   HPI   1 YR FU OU OCT FP. Pt stated vision has improved since last visit. Pt had cataract sx in both eyes performed by Dr. Herbert Deaner. Pts last A1C was 6.2  Last edited by Silvestre Moment on 02/02/2022  1:45 PM.      Referring physician: Kinnie Feil, MD [place],  [place] [id number]  HISTORICAL INFORMATION:   Selected notes from the MEDICAL RECORD NUMBER    Lab Results  Component Value Date   HGBA1C 6.5 12/02/2021     CURRENT MEDICATIONS: No current outpatient medications on file. (Ophthalmic Drugs)   No current facility-administered medications for this visit. (Ophthalmic Drugs)   Current Outpatient Medications (Other)  Medication Sig   aspirin EC 81 MG tablet Take 1 tablet (81 mg total) by mouth daily. Swallow whole.   BD PEN NEEDLE NANO 2ND GEN 32G X 4 MM MISC USE TO CHECK CAPILLARY GLUCOSE 3 TIMES A DAY   calcium-vitamin D (OSCAL WITH D) 500-5 MG-MCG tablet Take 1 tablet by mouth 2 (two) times daily.   cholecalciferol (VITAMIN D3) 25 MCG (1000 UNIT) tablet Take 1,000 Units by mouth daily.   diphenhydrAMINE (BENADRYL) 25 MG tablet Take 1 tablet (25 mg total) by mouth every 8 (eight) hours as needed. (Patient not taking: Reported on 12/14/2020)   FARXIGA 10 MG TABS tablet TAKE 1 TABLET BY MOUTH EVERY DAY   ferrous sulfate 325 (65 FE) MG tablet Take 1 tablet (325 mg total) by mouth daily with breakfast.   glucose blood (CONTOUR TEST) test strip Use to check CBG TID   Insulin Glargine (BASAGLAR KWIKPEN) 100 UNIT/ML INJECT 22 UNITS INTO THE SKIN DAILY.( MUST LAST 68 DAY)   Insulin Pen Needle (B-D ULTRAFINE III SHORT PEN) 31G X 8 MM MISC Use to check capillary glucose TID   Insulin Syringe-Needle U-100 (B-D  INS SYR ULTRAFINE 1CC/30G) 30G X 1/2" 1 ML MISC Use to check capillary glucose TID   JANUVIA 100 MG tablet TAKE 1 TABLET BY MOUTH EVERY DAY   losartan-hydrochlorothiazide (HYZAAR) 100-25 MG tablet TAKE 1 TABLET BY MOUTH EVERY DAY   metFORMIN (GLUCOPHAGE) 500 MG tablet Take 1 tablet (500 mg total) by mouth 2 (two) times daily with a meal.   nitroGLYCERIN (NITROSTAT) 0.4 MG SL tablet Place 1 tablet (0.4 mg total) under the tongue every 5 (five) minutes as needed for chest pain.   OneTouch Delica Lancets 24M MISC Use to check CBG TID   rosuvastatin (CRESTOR) 20 MG tablet TAKE 1 TABLET BY MOUTH EVERY DAY   No current facility-administered medications for this visit. (Other)      REVIEW OF SYSTEMS: ROS   Negative for: Constitutional, Gastrointestinal, Neurological, Skin, Genitourinary, Musculoskeletal, HENT, Endocrine, Cardiovascular, Eyes, Respiratory, Psychiatric, Allergic/Imm, Heme/Lymph Last edited by Silvestre Moment on 02/02/2022  1:44 PM.       ALLERGIES Allergies  Allergen Reactions   Ace Inhibitors Cough    PAST MEDICAL HISTORY Past Medical History:  Diagnosis Date   Abnormal vaginal bleeding 07/29/2013   Anemia 07/12/2006   09/12/11: Anemia panel not suggestive of iron or B12 deficiency.  CBC with diff shows no pancytopenia.  Colonoscopy 2008 and referral in 2010 for heme  positive stools not suggestive of need for further evaluation.  Most likely due to tamoxifen.    Arthritis    Diabetes mellitus    type 2   Ganglion cyst of wrist 10/14/2013   Hypertension    KNEE MENISCUS INJURY, UNSPECIFIED 07/12/2006   Qualifier: History of  By: Carmie End MD, Erin     Nuclear sclerotic cataract of both eyes 05/04/2020   Vitreomacular adhesion of left eye 05/04/2020   Vitreomacular adhesion of right eye 05/04/2020   Past Surgical History:  Procedure Laterality Date   ABDOMINAL HYSTERECTOMY     has ovaries   BREAST EXCISIONAL BIOPSY Right May 2011   TOTAL KNEE ARTHROPLASTY Right 03/01/2017    Procedure: RIGHT TOTAL KNEE ARTHROPLASTY;  Surgeon: Susa Day, MD;  Location: [facility];  Service: Orthopedics;  Laterality: Right;  120 mins with abductor block    FAMILY HISTORY Family History  Problem Relation Age of Onset   Heart disease Mother    Stroke Father    Cancer Brother        Lung   Heart disease Sister     SOCIAL HISTORY Social History   Tobacco Use   Smoking status: Never   Smokeless tobacco: Never  Vaping Use   Vaping Use: Never used  Substance Use Topics   Alcohol use: Yes   Drug use: No         OPHTHALMIC EXAM:  Base Eye Exam     Visual Acuity (ETDRS)       Right Left   Dist [place] 20/20 -1 20/20         Tonometry (Tonopen, 1:48 PM)       Right Left   Pressure 13 13         Pupils       Pupils Dark Light APD   Right PERRL 4 3 None   Left PERRL 4 3 None         Visual Fields       Left Right    Full Full         Extraocular Movement       Right Left    Full, Ortho Full, Ortho         Neuro/Psych     Oriented x3: Yes   Mood/Affect: Normal         Dilation     Both eyes: 1.0% Mydriacyl, 2.5% Phenylephrine @ 1:48 PM           Slit Lamp and Fundus Exam     External Exam       Right Left   External Normal Normal         Slit Lamp Exam       Right Left   Lids/Lashes Normal Normal   Conjunctiva/Sclera White and quiet White and quiet   Cornea Clear Clear   Anterior Chamber Deep and quiet Deep and quiet   Iris Round and reactive Round and reactive   Lens Centered posterior chamber intraocular lens Centered posterior chamber intraocular lens   Anterior Vitreous Normal Normal         Fundus Exam       Right Left   Posterior Vitreous Posterior vitreous detachment Posterior vitreous detachment   Disc Normal Normal   C/D Ratio 0.35 0.45   Macula Normal Normal   Vessels NPDR- Mild NPDR- Mild   Periphery Normal Normal            IMAGING AND PROCEDURES  Imaging and  Procedures for  02/02/22  OCT, Retina - OU - Both Eyes       Right Eye Quality was good. Scan locations included subfoveal. Central Foveal Thickness: 339. Progression has been stable. Findings include normal foveal contour, vitreomacular adhesion .   Left Eye Quality was good. Scan locations included subfoveal. Central Foveal Thickness: 315. Progression has been stable. Findings include normal foveal contour, vitreomacular adhesion .   Notes No active maculopathy OU, and no foveal distortion     Color Fundus Photography Optos - OU - Both Eyes       Right Eye Progression has no prior data. Disc findings include normal observations. Macula : normal observations. Periphery : normal observations.   Left Eye Progression has no prior data. Disc findings include normal observations. Macula : normal observations. Periphery : normal observations.   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 Controlled type 1 diabetes mellitus with left eye affected by mild nonproliferative retinopathy without macular edema (HCC)  E10.3292 OCT, Retina - OU - Both Eyes    Color Fundus Photography Optos - OU - Both Eyes    2. Posterior vitreous detachment of both eyes  H43.813     3. Pseudophakia of both eyes  Z96.1     4. Mild nonproliferative diabetic retinopathy of both eyes without macular edema associated with type 2 diabetes mellitus ([place])  A35.5732       1.  OU doing well.  No progression of retinopathy  2.  Much improved acuity post cataract surgery with Dr. Herbert Deaner  3.  Bilateral PVD physiologic stable  Ophthalmic Meds Ordered this visit:  No orders of the defined types were placed in this encounter.      Return in about 1 year (around 02/03/2023) for DILATE OU, COLOR FP,  OCT.  There are no Patient Instructions on file for this visit.   Explained the diagnoses, plan, and follow up with the patient and they expressed understanding.  Patient expressed understanding of the importance of proper follow up care.   Clent Demark Shakea Isip M.D. Diseases & Surgery of the Retina and Vitreous Retina [facility] Diabetic [place] 02/02/22     Abbreviations: M myopia (nearsighted); A astigmatism; H hyperopia (farsighted); P presbyopia; Mrx spectacle prescription;  CTL contact lenses; OD right eye; OS left eye; OU both eyes  XT exotropia; ET esotropia; PEK punctate epithelial keratitis; PEE punctate epithelial erosions; DES dry eye syndrome; MGD meibomian gland dysfunction; ATs artificial tears; PFAT's preservative free artificial tears; [place] nuclear sclerotic cataract; PSC posterior subcapsular cataract; ERM epi-retinal membrane; PVD posterior vitreous detachment; RD retinal detachment; DM diabetes mellitus; DR diabetic retinopathy; NPDR non-proliferative diabetic retinopathy; PDR proliferative diabetic retinopathy; CSME clinically significant macular edema; DME diabetic macular edema; dbh dot blot hemorrhages; CWS cotton wool spot; POAG primary open angle glaucoma; C/D cup-to-disc ratio; HVF humphrey visual field; GVF goldmann visual field; OCT optical coherence tomography; IOP intraocular pressure; BRVO Branch retinal vein occlusion; CRVO central retinal vein occlusion; CRAO central retinal artery occlusion; BRAO branch retinal artery occlusion; RT retinal tear; SB scleral buckle; PPV pars plana vitrectomy; VH Vitreous hemorrhage; PRP panretinal laser photocoagulation; IVK intravitreal kenalog; VMT vitreomacular traction; MH Macular hole;  NVD neovascularization of the disc; NVE  neovascularization elsewhere; AREDS age related eye disease study; ARMD age related macular degeneration; POAG primary open angle glaucoma; EBMD epithelial/anterior basement membrane dystrophy; ACIOL anterior chamber  intraocular lens; IOL intraocular lens; PCIOL posterior chamber intraocular lens; Phaco/IOL phacoemulsification with intraocular lens placement; [place] photorefractive keratectomy; LASIK laser assisted in situ keratomileusis; HTN hypertension; DM diabetes mellitus; COPD chronic obstructive pulmonary disease

## 2022-02-02 NOTE — Assessment & Plan Note (Signed)
Looks great OU

## 2022-02-02 NOTE — Assessment & Plan Note (Signed)
Stable

## 2022-02-05 ENCOUNTER — Other Ambulatory Visit: Payer: Self-pay | Admitting: Family Medicine

## 2022-02-22 ENCOUNTER — Encounter: Payer: Self-pay | Admitting: Family Medicine

## 2022-02-23 ENCOUNTER — Other Ambulatory Visit: Payer: Self-pay | Admitting: Family Medicine

## 2022-02-24 NOTE — Telephone Encounter (Signed)
Called pt to clarify if she is having symptoms, pt  stated that she was itching for 2 days then the itching stopped. Pt stated that she has an appt with you on the 27th of this so she will see you then instead of coming in sooner.

## 2022-02-26 ENCOUNTER — Other Ambulatory Visit: Payer: Self-pay | Admitting: Family Medicine

## 2022-03-10 ENCOUNTER — Ambulatory Visit: Payer: Managed Care, Other (non HMO) | Admitting: Family Medicine

## 2022-03-10 ENCOUNTER — Encounter: Payer: Self-pay | Admitting: Family Medicine

## 2022-03-10 VITALS — BP 132/76 | HR 77 | Ht 67.0 in | Wt 161.1 lb

## 2022-03-10 DIAGNOSIS — E119 Type 2 diabetes mellitus without complications: Secondary | ICD-10-CM | POA: Diagnosis not present

## 2022-03-10 DIAGNOSIS — I10 Essential (primary) hypertension: Secondary | ICD-10-CM | POA: Diagnosis not present

## 2022-03-10 LAB — POCT GLYCOSYLATED HEMOGLOBIN (HGB A1C): HbA1c, POC (controlled diabetic range): 7.2 % — AB (ref 0.0–7.0)

## 2022-03-10 MED ORDER — BASAGLAR KWIKPEN 100 UNIT/ML ~~LOC~~ SOPN: PEN_INJECTOR | SUBCUTANEOUS | 2 refills | Status: DC

## 2022-03-10 MED ORDER — SITAGLIPTIN PHOSPHATE 100 MG PO TABS: 100.0000 mg | ORAL_TABLET | Freq: Every day | ORAL | 1 refills | Status: DC

## 2022-03-10 NOTE — Patient Instructions (Signed)
It was nice seeing you today. We will give you your flu shot. Please, go to the pharmacy for COVID vaccine. I will call you with your A1C result and adjust your insulin as needed.

## 2022-03-10 NOTE — Progress Notes (Signed)
    SUBJECTIVE:   CHIEF COMPLAINT / HPI:   DM2:  The patient is here for DM follow-up. Her home CBG is mostly in the 100s, with the lowest of 59 and the highest of 132. She denies hypoglycemic symptoms. She takes Basaglar 21 units (instead of 22 units) daily, Farxiga 10 mg QD, and Metformin 500 mg BID. She has been out of her Januvia 100 mg QD for about 3 weeks. The pharmacy was out of stock but should have it now. She requested a refill.   Vaginal discharge: She used OTC Monistat 7, and her symptoms have resolved. No other concerns.   PERTINENT  PMH / PSH: PHMx reviewed  OBJECTIVE:   BP 132/76   Pulse 77   Ht '5\' 7"'$  (1.702 m)   Wt 161 lb 2 oz (73.1 kg)   SpO2 100%   BMI 25.24 kg/m   Physical Exam Vitals and nursing note reviewed.  Cardiovascular:     Rate and Rhythm: Normal rate and regular rhythm.     Heart sounds: Normal heart sounds. No murmur heard. Pulmonary:     Effort: Pulmonary effort is normal. No respiratory distress.     Breath sounds: Normal breath sounds. No wheezing.  Abdominal:     General: Abdomen is flat. Bowel sounds are normal. There is no distension.     Palpations: Abdomen is soft. There is no mass.     Tenderness: There is no abdominal tenderness.  Musculoskeletal:     Right lower leg: No edema.     Left lower leg: No edema.      ASSESSMENT/PLAN:   Diabetes mellitus with coincident hypertension (HCC) A1C increased to 7.2 which is still close to goal. Off Januvia x 3 weeks. Januvia refilled. Reduced Basaglar to 20 units QD. Continue Farxiga and Metformin at current dose. Continue home CBG monitoring. UACR checked. F/U in 3 months for reassessment. She agreed with the plan.     Vaginitis: Resolved.  Vaccination -  We are out of stock for COVID-19 vaccines. Unfortunately , her influenza vaccine was not administered prior to d/c. She will obtain COVID and flu shot at the pharmacy.  Kathy Mews, MD [place]

## 2022-03-10 NOTE — Assessment & Plan Note (Addendum)
A1C increased to 7.2 which is still close to goal. Off Januvia x 3 weeks. Januvia refilled. Reduced Basaglar to 20 units QD. Continue Farxiga and Metformin at current dose. Continue home CBG monitoring. UACR checked. F/U in 3 months for reassessment. She agreed with the plan.

## 2022-03-12 LAB — MICROALBUMIN / CREATININE URINE RATIO
Creatinine, Urine: 18.5 mg/dL
Microalb/Creat Ratio: <16 mg/g creat (ref 0–29)
Microalbumin, Urine: <3 ug/mL

## 2022-03-13 ENCOUNTER — Telehealth: Payer: Self-pay | Admitting: Family Medicine

## 2022-03-13 NOTE — Telephone Encounter (Signed)
HIPAA compliant callback message left.  Please, advise her that her urine protein test is normal.

## 2022-03-13 NOTE — Telephone Encounter (Signed)
Patient returns call to nurse line.   Results given to patient.

## 2022-04-25 ENCOUNTER — Other Ambulatory Visit: Payer: Self-pay | Admitting: Family Medicine

## 2022-06-13 ENCOUNTER — Encounter: Payer: Self-pay | Admitting: Family Medicine

## 2022-06-13 ENCOUNTER — Ambulatory Visit: Payer: Managed Care, Other (non HMO) | Admitting: Family Medicine

## 2022-06-13 VITALS — BP 126/72 | HR 88 | Ht 67.0 in | Wt 164.2 lb

## 2022-06-13 DIAGNOSIS — I10 Essential (primary) hypertension: Secondary | ICD-10-CM | POA: Diagnosis not present

## 2022-06-13 DIAGNOSIS — E1169 Type 2 diabetes mellitus with other specified complication: Secondary | ICD-10-CM | POA: Insufficient documentation

## 2022-06-13 DIAGNOSIS — E785 Hyperlipidemia, unspecified: Secondary | ICD-10-CM

## 2022-06-13 DIAGNOSIS — Z789 Other specified health status: Secondary | ICD-10-CM

## 2022-06-13 MED ORDER — SITAGLIPTIN PHOSPHATE 100 MG PO TABS: 100.0000 mg | ORAL_TABLET | Freq: Every day | ORAL | 1 refills | Status: DC

## 2022-06-13 NOTE — Assessment & Plan Note (Signed)
BP looks good on her current regimen. Cmet checked.

## 2022-06-13 NOTE — Assessment & Plan Note (Signed)
A1C, Cmet checked today. I refilled her Januvia. Will likely need to switch this medications pending Medicare insurance status. Will message the pharmacy tech regarding this. Plan to go ahead and switch her Basaglar to Lantus once I have her A1C result. Continue home CBG monitoring.

## 2022-06-13 NOTE — Progress Notes (Signed)
    SUBJECTIVE:   CHIEF COMPLAINT / HPI:   DM2/HTN/HLD:  She is here for f/u. Compliant with Januvia 100 mg QD, Farxiga 10 mg QD, Metformin 500 mg BID, and Basaglar 20 units QD for her DM. She is compliant with Hyzaar 100/25 mg QD for her BP and Crestor 20 mg QD for her HLD.   She is switching insurance to [employer] from [place]. She received a letter to switch Basaglar to Lantus. Also, her farxiga and Januvia coverage would be limited with Medicare insurance and wants to switch to a different agent.   PERTINENT  PMH / PSH: PMHx reviewed  OBJECTIVE:   BP 126/72   Pulse 88   Ht '5\' 7"'$  (1.702 m)   Wt 164 lb 3.2 oz (74.5 kg)   SpO2 100%   BMI 25.72 kg/m   Physical Exam Vitals and nursing note reviewed.  Cardiovascular:     Rate and Rhythm: Normal rate and regular rhythm.     Heart sounds: Normal heart sounds. No murmur heard. Pulmonary:     Effort: Pulmonary effort is normal. No respiratory distress.     Breath sounds: Normal breath sounds. No wheezing.  Abdominal:     General: Abdomen is flat. Bowel sounds are normal. There is no distension.  Musculoskeletal:     Right lower leg: No edema.     Left lower leg: No edema.     Comments: Sensory exam of the foot is normal, tested with the monofilament. Good pulses, no lesions or ulcers, good peripheral pulses.   Neurological:     Mental Status: She is alert.      ASSESSMENT/PLAN:   Essential hypertension BP looks good on her current regimen. Cmet checked.   Hyperlipidemia associated with type 2 diabetes mellitus ([place]) Lipid panel checked today - non-fasting.   Type 2 diabetes mellitus with other specified complication (HCC) Q3E, Cmet checked today. I refilled her Januvia. Will likely need to switch this medications pending Medicare insurance status. Will message the pharmacy tech regarding this. Plan to go ahead and switch her Basaglar to Lantus once I have her A1C result. Continue home CBG monitoring.   She went to  the pharmacy to get her flu shot and was told she needed Hep B vaccination. Hep B antibody testing ordered to confirm vaccination status.   Andrena Mews, MD [place]

## 2022-06-13 NOTE — Assessment & Plan Note (Signed)
Lipid panel checked today - non-fasting.

## 2022-06-13 NOTE — Patient Instructions (Signed)
Diabetes Mellitus Action Plan Following a diabetes action plan is a way for you to manage your diabetes (diabetes mellitus) symptoms. The plan is color-coded to help you understand what actions you need to take based on any symptoms you are having. If you have symptoms in the red zone, you need medical care right away. If you have symptoms in the yellow zone, you are having problems. If you have symptoms in the green zone, you are doing well. Learning about and understanding diabetes can take time. Follow the plan that you develop with your health care provider. Know the target range for your blood sugar (glucose) level, and review your treatment plan with your health care provider at each visit. The target range for my blood sugar level is 130 - 180 mg/dL. Red zone Get medical help right away if you have any of the following symptoms: A blood sugar test result that is below 54 mg/dL (3 mmol/L). A blood sugar test result that is at or above 240 mg/dL (13.3 mmol/L) for 2 days in a row. Confusion or trouble thinking clearly. Difficulty breathing. Sickness or a fever for 2 or more days that is not getting better. Moderate or large ketone levels in your urine. Feeling tired or having no energy. If you have any red zone symptoms, do not wait to see if the symptoms will go away. Get medical help right away. Call your local emergency services (911 in the [place] Do not drive yourself to the hospital. If you have severely low blood sugar (severe hypoglycemia) and you cannot eat or drink, you may need glucagon. Make sure a family member or close friend knows how to check your blood sugar and how to give you glucagon. You may need to be treated in a hospital for this condition. Yellow zone If you have any of the following symptoms, your diabetes is not under control and you may need to make some changes: A blood sugar test result that is at or above 240 mg/dL (13.3 mmol/L) for 2 days in a row. Blood sugar  test results that are below 70 mg/dL (3.9 mmol/L). Other symptoms of hypoglycemia, such as: Shaking or feeling light-headed. Confusion or irritability. Feeling hungry. Having a fast heartbeat. If you have any yellow zone symptoms: Treat your hypoglycemia by eating or drinking 15 grams of a rapid-acting carbohydrate. Follow the 15:15 rule: Take 15 grams of a rapid-acting carbohydrate, such as: 1 tube of glucose gel. 4 glucose pills. 4 oz (120 mL) of fruit juice. 4 oz (120 mL) of regular (not diet) soda. Check your blood sugar 15 minutes after you take the carbohydrate. If the repeat blood sugar test is still at or below 70 mg/dL (3.9 mmol/L), take 15 grams of a carbohydrate again. If your blood sugar does not increase above 70 mg/dL (3.9 mmol/L) after 3 tries, get medical help right away. After your blood sugar returns to normal, eat a meal or a snack within 1 hour. Keep taking your daily medicines as told by your health care provider. Check your blood sugar more often than you normally would. Write down your results. Call your health care provider if you have trouble keeping your blood sugar in your target range.  Green zone These signs mean you are doing well and you can continue what you are doing to manage your diabetes: Your blood sugar is within your personal target range. For most people, a blood sugar level before a meal (preprandial) should be 80-130 mg/dL (4.4-7.2 mmol/L).  You feel well, and you are able to do daily activities. If you are in the green zone, continue to manage your diabetes as told by your health care provider. To do this: Eat a healthy diet. Exercise regularly. Check your blood sugar as told by your health care provider. Take your medicines as told by your health care provider.  Where to find more information American Diabetes Association (ADA): diabetes.org Association of Diabetes Care & Education Specialists (ADCES):  diabeteseducator.org Summary Following a diabetes action plan is a way for you to manage your diabetes symptoms. The plan is color-coded to help you understand what actions you need to take based on any symptoms you are having. Follow the plan that you develop with your health care provider. Make sure you know your personal target blood sugar level. Review your treatment plan with your health care provider at each visit. This information is not intended to replace advice given to you by your health care provider. Make sure you discuss any questions you have with your health care provider. Document Revised: 11/06/2019 Document Reviewed: 11/06/2019 Elsevier Patient Education  [place].

## 2022-06-14 ENCOUNTER — Telehealth: Payer: Self-pay | Admitting: Family Medicine

## 2022-06-14 LAB — CMP14+EGFR
ALT: 15 IU/L (ref 0–32)
AST: 22 IU/L (ref 0–40)
Albumin/Globulin Ratio: 2 (ref 1.2–2.2)
Albumin: 4.7 g/dL (ref 3.9–4.9)
Alkaline Phosphatase: 84 IU/L (ref 44–121)
BUN/Creatinine Ratio: 26 (ref 12–28)
BUN: 25 mg/dL (ref 8–27)
Bilirubin Total: 0.2 mg/dL (ref 0.0–1.2)
CO2: 20 mmol/L (ref 20–29)
Calcium: 9.5 mg/dL (ref 8.7–10.3)
Chloride: 100 mmol/L (ref 96–106)
Creatinine, Ser: 0.96 mg/dL (ref 0.57–1.00)
Globulin, Total: 2.4 g/dL (ref 1.5–4.5)
Glucose: 154 mg/dL — ABNORMAL HIGH (ref 70–99)
Potassium: 4.2 mmol/L (ref 3.5–5.2)
Sodium: 142 mmol/L (ref 134–144)
Total Protein: 7.1 g/dL (ref 6.0–8.5)
eGFR: 65 mL/min/{1.73_m2} (ref >59)

## 2022-06-14 LAB — LIPID PANEL
Chol/HDL Ratio: 1.9 ratio (ref 0.0–4.4)
Cholesterol, Total: 159 mg/dL (ref 100–199)
HDL: 82 mg/dL (ref >39)
LDL Chol Calc (NIH): 65 mg/dL (ref 0–99)
Triglycerides: 57 mg/dL (ref 0–149)
VLDL Cholesterol Cal: 12 mg/dL (ref 5–40)

## 2022-06-14 LAB — HEMOGLOBIN A1C
Est. average glucose Bld gHb Est-mCnc: 186 mg/dL
Hgb A1c MFr Bld: 8.1 % — ABNORMAL HIGH (ref 4.8–5.6)

## 2022-06-14 LAB — HEPATITIS B SURFACE ANTIBODY, QUANTITATIVE: Hepatitis B Surf Ab Quant: <3.1 m[IU]/mL — ABNORMAL LOW (ref >9.9)

## 2022-06-14 MED ORDER — INSULIN GLARGINE 100 UNIT/ML SOLOSTAR PEN: 20.0000 [IU] | PEN_INJECTOR | Freq: Every day | SUBCUTANEOUS | 1 refills | Status: DC

## 2022-06-14 NOTE — Telephone Encounter (Signed)
I called and discussed test results with her. A1C improved some but not at goal of 7-7.5 However, she did confirm that her home CBG readings are mostly in the 140s. Will transition from [place] to Lantus per insurance covearge. F/U in 2 weeks with home CBG log for DM med adjustment. She will also receive Hep B vaccination. She agreed with the plan.

## 2022-06-27 ENCOUNTER — Encounter: Payer: Self-pay | Admitting: Family Medicine

## 2022-06-27 ENCOUNTER — Ambulatory Visit (INDEPENDENT_AMBULATORY_CARE_PROVIDER_SITE_OTHER): Payer: Managed Care, Other (non HMO) | Admitting: Family Medicine

## 2022-06-27 VITALS — BP 123/70 | HR 73 | Ht 67.0 in | Wt 163.4 lb

## 2022-06-27 DIAGNOSIS — Z789 Other specified health status: Secondary | ICD-10-CM

## 2022-06-27 DIAGNOSIS — Z23 Encounter for immunization: Secondary | ICD-10-CM

## 2022-06-27 DIAGNOSIS — E1169 Type 2 diabetes mellitus with other specified complication: Secondary | ICD-10-CM | POA: Diagnosis not present

## 2022-06-27 DIAGNOSIS — Z794 Long term (current) use of insulin: Secondary | ICD-10-CM | POA: Diagnosis not present

## 2022-06-27 HISTORY — DX: Other specified health status: Z78.9

## 2022-06-27 NOTE — Assessment & Plan Note (Signed)
First dose of three series completed today.

## 2022-06-27 NOTE — Assessment & Plan Note (Signed)
Continue Lantus 20 units QD, Januvia 100 mg QD and metformin 500 mg BID. Consider adjusting Metformin in the future if A1C remains above her goal. She will work on lifestyle modification in addition to her medications for now. F/U in 3 months for reassessment.

## 2022-06-27 NOTE — Patient Instructions (Signed)

## 2022-06-27 NOTE — Progress Notes (Signed)
    SUBJECTIVE:   CHIEF COMPLAINT / HPI:   DM2: She is here to f/u on her elevated A1C report. She presented her home CBG report from her glucometer - Her numbers range from 61 to 207. She is mostly in the 100s, except for that one time 200 within the past two weeks. (182, 197,105, 207, 106, 92, 76, 61, 93,157, 149, 152, 87, 149, 112). She does not want any medication adjustment for now, as she stated that she has been eating too much concentrated sugar lately and plans on cutting back on it.    Hep B vaccination: Her Hep BSAb testing showed non-immune status. She is here her first shot of the Hep B series.   PERTINENT  PMH / PSH: PMHx reviewed  OBJECTIVE:   BP 123/70   Pulse 73   Ht '5\' 7"'$  (1.702 m)   Wt 163 lb 6 oz (74.1 kg)   SpO2 100%   BMI 25.59 kg/m   Physical Exam Vitals reviewed.  Cardiovascular:     Rate and Rhythm: Normal rate and regular rhythm.     Heart sounds: Normal heart sounds. No murmur heard. Pulmonary:     Effort: Pulmonary effort is normal. No respiratory distress.     Breath sounds: Normal breath sounds. No wheezing.      ASSESSMENT/PLAN:   Type 2 diabetes mellitus with other specified complication (HCC) Continue Lantus 20 units QD, Januvia 100 mg QD and metformin 500 mg BID. Consider adjusting Metformin in the future if A1C remains above her goal. She will work on lifestyle modification in addition to her medications for now. F/U in 3 months for reassessment.   Nonimmune to hepatitis B virus First dose of three series completed today.     Andrena Mews, MD [place]

## 2022-07-02 ENCOUNTER — Other Ambulatory Visit: Payer: Self-pay | Admitting: Family Medicine

## 2022-07-02 DIAGNOSIS — E1165 Type 2 diabetes mellitus with hyperglycemia: Secondary | ICD-10-CM

## 2022-07-06 ENCOUNTER — Telehealth: Payer: Self-pay

## 2022-07-06 ENCOUNTER — Other Ambulatory Visit (HOSPITAL_COMMUNITY): Payer: Self-pay

## 2022-07-06 NOTE — Telephone Encounter (Signed)
Prior Auth for patients medication ONETOUCH VERIO STRIPS approved by [employer] from 05/15/22 to 05/15/23.  CoverMyMeds KeyJosie Dixon PA Case ID #: [id number]

## 2022-07-06 NOTE — Telephone Encounter (Signed)
A Prior Authorization was initiated for this patients ONETOUCH VERIO STRIPS through CoverMyMeds.   Key: [id number]

## 2022-07-19 ENCOUNTER — Other Ambulatory Visit: Payer: Self-pay | Admitting: Family Medicine

## 2022-07-25 ENCOUNTER — Ambulatory Visit (INDEPENDENT_AMBULATORY_CARE_PROVIDER_SITE_OTHER): Payer: Managed Care, Other (non HMO)

## 2022-07-25 DIAGNOSIS — Z23 Encounter for immunization: Secondary | ICD-10-CM | POA: Diagnosis not present

## 2022-07-25 NOTE — Progress Notes (Signed)
Patient presents to nurse clinic for second Hep B vaccination. Administered in LD, site unremarkable, tolerated injection well.   Talbot Grumbling, RN

## 2022-08-02 ENCOUNTER — Other Ambulatory Visit: Payer: Self-pay | Admitting: Family Medicine

## 2022-08-22 ENCOUNTER — Ambulatory Visit (INDEPENDENT_AMBULATORY_CARE_PROVIDER_SITE_OTHER): Payer: Medicare HMO | Admitting: Family Medicine

## 2022-08-22 ENCOUNTER — Encounter: Payer: Self-pay | Admitting: Family Medicine

## 2022-08-22 VITALS — BP 139/72 | HR 77 | Ht 67.0 in | Wt 172.2 lb

## 2022-08-22 DIAGNOSIS — Z794 Long term (current) use of insulin: Secondary | ICD-10-CM

## 2022-08-22 DIAGNOSIS — E1169 Type 2 diabetes mellitus with other specified complication: Secondary | ICD-10-CM | POA: Diagnosis not present

## 2022-08-22 DIAGNOSIS — L309 Dermatitis, unspecified: Secondary | ICD-10-CM | POA: Insufficient documentation

## 2022-08-22 MED ORDER — BD PEN NEEDLE NANO 2ND GEN 32G X 4 MM MISC: 1 refills | Status: DC

## 2022-08-22 MED ORDER — TRIAMCINOLONE ACETONIDE 0.1 % EX CREA: 1.0000 | TOPICAL_CREAM | Freq: Two times a day (BID) | CUTANEOUS | 0 refills | Status: DC

## 2022-08-22 NOTE — Assessment & Plan Note (Signed)
Etiology unknown. Likely contact dermatitis. Trial triamcinolone cream - escribed. F/U in 2 weeks for reassessment. Consider skin biopsy if there is no improvement. She agreed with the plan.

## 2022-08-22 NOTE — Patient Instructions (Signed)
It was nice seeing you today. I have sent in a cream for your neck rash. Please see me in 2-3 weeks for your annual wellness visit.

## 2022-08-22 NOTE — Progress Notes (Signed)
    SUBJECTIVE:   CHIEF COMPLAINT / HPI:   DM2: No new concerns. She is compliant with Farxiga 10 mg QD, Metformin 500 mg BID, Januvia 100 mg QD, and Lantus 20 units daily. She requested a refill of her insulin pen as well as a cheaper form of her Marcelline Deist as it might now cost her $100 with her new insurance. Home CBG ranges from 110 to 140 max.  Skin rash: C/O an itchy rash on her left upper back, which started about two weeks ago. She denies any form of exposure. She uses OTC cream with no improvement.  Itchy ears: C/O itchy ears. Uses OTC wax removal with minimal improvement.  PERTINENT  PMH / PSH: PMHx reviewed  OBJECTIVE:   BP 139/72   Pulse 77   Ht 5\' 7"  (1.702 m)   Wt 172 lb 3.2 oz (78.1 kg)   SpO2 100%   BMI 26.97 kg/m   Physical Exam Vitals and nursing note reviewed.  HENT:     Right Ear: Tympanic membrane, ear canal and external ear normal. There is no impacted cerumen.     Left Ear: Tympanic membrane, ear canal and external ear normal. There is no impacted cerumen.  Cardiovascular:     Rate and Rhythm: Normal rate and regular rhythm.     Heart sounds: Normal heart sounds. No murmur heard. Pulmonary:     Effort: Pulmonary effort is normal. No respiratory distress.     Breath sounds: Normal breath sounds.  Skin:    Comments: Dry, hyperpigmented, maculopapular patch on her left upper back.      ASSESSMENT/PLAN:   Type 2 diabetes mellitus with other specified complication (HCC) Stable. I will message our pharmacy tech to help with medication prior auth/review regarding her [place]. Insulin needle refilled. Return in 2-3 weeks for A1C check. She agreed with the plan.   Dermatitis Etiology unknown. Likely contact dermatitis. Trial triamcinolone cream - escribed. F/U in 2 weeks for reassessment. Consider skin biopsy if there is no improvement. She agreed with the plan.    Itchy ears: Normal ear exam. ?? Due to allergy. May use her Benadryl  prn. Consider addition of other H1 antihistamines in the future if there is no improvement. Reassess at next visit.      Janit Pagan, MD [facility]

## 2022-08-22 NOTE — Assessment & Plan Note (Signed)
Stable. I will message our pharmacy tech to help with medication prior auth/review regarding her [place]. Insulin needle refilled. Return in 2-3 weeks for A1C check. She agreed with the plan.

## 2022-08-22 NOTE — Addendum Note (Signed)
Addended by: Janit Pagan T on: 08/22/2022 02:06 PM   Modules accepted: Orders

## 2022-08-23 ENCOUNTER — Other Ambulatory Visit: Payer: Self-pay | Admitting: Family Medicine

## 2022-08-23 MED ORDER — DAPAGLIFLOZIN PROPANEDIOL 10 MG PO TABS: 10.0000 mg | ORAL_TABLET | Freq: Every day | ORAL | 2 refills | Status: DC

## 2022-08-23 NOTE — Addendum Note (Signed)
Addended by: Janit Pagan T on: 08/23/2022 09:03 AM   Modules accepted: Orders

## 2022-08-24 ENCOUNTER — Other Ambulatory Visit (HOSPITAL_COMMUNITY): Payer: Self-pay

## 2022-08-28 ENCOUNTER — Telehealth: Payer: Self-pay

## 2022-08-28 NOTE — Telephone Encounter (Signed)
Mailing az&me application to patients home for [place].

## 2022-08-28 NOTE — Telephone Encounter (Signed)
-----   Message from Doreene Eland, MD sent at 08/25/2022  7:08 AM EDT ----- [place] Crane,  Please help her initiate medication assistant application for [place]. Thanks.  [place]

## 2022-09-07 ENCOUNTER — Telehealth: Payer: Self-pay | Admitting: Family Medicine

## 2022-09-07 NOTE — Telephone Encounter (Signed)
I called to prep her for AWV tomorrow. She was appreciative of the call.

## 2022-09-07 NOTE — Progress Notes (Signed)
Subjective:   Kathy Duncan is a 67 y.o. female who presents for an Initial Medicare Annual Wellness Visit. No concerns. Wants to check her A1C and needs a refill of her Januvia.  Review of Systems    As in the body of hx.       Objective:    Today's Vitals   09/08/22 0951  BP: 118/79  Pulse: 77  SpO2: 100%  Weight: 171 lb (77.6 kg)  Height: 5\' 7"  (1.702 m)  PainSc: 0-No pain   Body mass index is 26.78 kg/m. Physical Exam Vitals and nursing note reviewed.  Cardiovascular:     Rate and Rhythm: Normal rate and regular rhythm.     Heart sounds: Normal heart sounds. No murmur heard. Pulmonary:     Effort: Pulmonary effort is normal. No respiratory distress.     Breath sounds: Normal breath sounds.  Musculoskeletal:     Right lower leg: No edema.     Left lower leg: No edema.        09/08/2022    9:50 AM 08/22/2022    8:44 AM 06/27/2022    8:44 AM 06/13/2022    8:33 AM 03/10/2022    8:33 AM 12/02/2021    8:38 AM 08/26/2021    8:41 AM  Advanced Directives  Does Patient Have a Medical Advance Directive? No No No No No No No  Would patient like information on creating a medical advance directive? No - Patient declined No - Patient declined No - Patient declined No - Patient declined No - Patient declined No - Patient declined No - Patient declined    Current Medications (verified) Outpatient Encounter Medications as of 09/08/2022  Medication Sig   aspirin EC 81 MG tablet Take 1 tablet (81 mg total) by mouth daily. Swallow whole.   cholecalciferol (VITAMIN D3) 25 MCG (1000 UNIT) tablet Take 1,000 Units by mouth daily.   ferrous sulfate 325 (65 FE) MG tablet Take 1 tablet (325 mg total) by mouth daily with breakfast.   insulin glargine (LANTUS SOLOSTAR) 100 UNIT/ML Solostar Pen INJECT 20 UNITS INTO THE SKIN DAILY   losartan-hydrochlorothiazide (HYZAAR) 100-25 MG tablet TAKE 1 TABLET BY MOUTH EVERY DAY   metFORMIN (GLUCOPHAGE) 500 MG tablet TAKE 1 TABLET BY MOUTH 2  TIMES DAILY WITH A MEAL.   rosuvastatin (CRESTOR) 20 MG tablet TAKE 1 TABLET BY MOUTH EVERY DAY   triamcinolone cream (KENALOG) 0.1 % Apply 1 Application topically 2 (two) times daily.   [DISCONTINUED] sitaGLIPtin (JANUVIA) 100 MG tablet Take 1 tablet (100 mg total) by mouth daily.   dapagliflozin propanediol (FARXIGA) 10 MG TABS tablet Take 1 tablet (10 mg total) by mouth daily.   diphenhydrAMINE (BENADRYL) 25 MG tablet Take 1 tablet (25 mg total) by mouth every 8 (eight) hours as needed. (Patient not taking: Reported on 12/14/2020)   Insulin Pen Needle (BD PEN NEEDLE NANO 2ND GEN) 32G X 4 MM MISC Use to inject insulin as instructed   Lancets (ONETOUCH DELICA PLUS LANCET33G) MISC USE TO CHECK BLOOD SUGAR 3 TIMES A DAY   nitroGLYCERIN (NITROSTAT) 0.4 MG SL tablet Place 1 tablet (0.4 mg total) under the tongue every 5 (five) minutes as needed for chest pain. (Patient not taking: Reported on 03/10/2022)   ONETOUCH VERIO test strip USE TO CHECK BLOOD SUGAR 3 TIMES A DAY   sitaGLIPtin (JANUVIA) 100 MG tablet Take 1 tablet (100 mg total) by mouth daily.   No facility-administered encounter medications on file as of 09/08/2022.  Allergies (verified) Ace inhibitors   History: Past Medical History:  Diagnosis Date   Abnormal vaginal bleeding 07/29/2013   Anemia 07/12/2006   09/12/11: Anemia panel not suggestive of iron or B12 deficiency.  CBC with diff shows no pancytopenia.  Colonoscopy 2008 and referral in 2010 for heme positive stools not suggestive of need for further evaluation.  Most likely due to tamoxifen.    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   Procedure: RIGHT TOTAL KNEE ARTHROPLASTY;  Surgeon: Jene Every, MD;  Location: [facility];  Service: Orthopedics;  Laterality: Right;  120 mins with abductor block   Family History  Problem Relation Age of Onset   Heart disease Mother    Stroke Father    Cancer Brother        Lung   Heart disease Sister    Social History   Socioeconomic History   Marital status: Married    Spouse name: Not on file   Number of children: Not on file   Years of education: Not on file   Highest education level: Not on file  Occupational History   Not on file  Tobacco Use   Smoking status: Never   Smokeless tobacco: Never  Vaping Use   Vaping Use: Never used  Substance and Sexual Activity   Alcohol use: Yes   Drug use: No   Sexual activity: Not Currently    Comment: 1st intercourse 67 yo-Fewer than 5 partners  Other Topics Concern   Not on file  Social History Narrative   Works at [employer], does some heavy lifting.  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   Social Determinants of Health   Financial Resource Strain: Not on file  Food Insecurity: No Food Insecurity (09/07/2022)   Hunger Vital Sign    Worried About Running Out of Food in the Last Year: Never true    Ran Out of Food in the Last Year: Never true  Transportation Needs: No Transportation Needs (09/07/2022)   PRAPARE - Administrator, Civil Service (Medical): No    Lack of Transportation (Non-Medical): No  Physical Activity: Sufficiently Active (09/07/2022)   Exercise Vital Sign    Days of Exercise per Week: 4 days    Minutes of Exercise per Session: 70 min  Stress: Not on file  Social Connections: Not on file    Tobacco Counseling Counseling given: No   Clinical Intake:     Pain Score: 0-No pain           Diabetic?Yes - A1C improved from 8.1 to 7.6          Activities of Daily Living    09/08/2022    2:20 PM  In your present state of health, do you have any difficulty performing the following activities:  Hearing? 0  Vision? 0  Difficulty concentrating or making decisions? 0  Walking or climbing stairs? 0  Dressing or bathing? 0  Doing  errands, shopping? 0    Patient Care Team: Doreene Eland, MD as PCP - General (Family Medicine) Christell Constant, MD as PCP - Cardiology (Cardiology)  Indicate any recent Medical Services you may have received from other than [facility] providers in the past year (date may be approximate).     Assessment:   This is a routine wellness examination for Kathy Duncan.  Hearing/Vision screen Hearing Screening   250Hz  500Hz  1000Hz  2000Hz  4000Hz   Right ear 20 20 20 20 20   Left ear 20 20 20 20 20    Vision Screening   Right eye Left eye Both eyes  Without correction 20/20 20/20 20/20   With correction     Comments: Patient is followed by Dr. Bing Ree   Dietary issues and exercise activities discussed:     Goals Addressed             This Visit's Progress    HEMOGLOBIN A1C < 8       I want to get my glucose well controlled.  I have been eating healthy. I have started exercising as well. I know my A1C should be better now.        Depression Screen    09/08/2022    9:58 AM 09/08/2022    9:50 AM 08/22/2022    8:44 AM 06/27/2022    8:45 AM 06/13/2022    8:32 AM 03/10/2022    8:33 AM 12/02/2021    8:38 AM  PHQ 2/9 Scores  PHQ - 2 Score 0  0 0 0 0 0  PHQ- 9 Score 0  0 0 0 0 0  Exception Documentation  Patient refusal         Fall Risk    09/08/2022    2:19 PM 09/08/2022    9:50 AM 08/22/2022    8:44 AM 06/27/2022    8:45 AM 06/13/2022    8:32 AM  Fall Risk   Falls in the past year? 0 0 0 0 0  Number falls in past yr: 0 0 0 0 0  Injury with Fall? 0 0 0 0 0    FALL RISK PREVENTION PERTAINING TO THE HOME:  Any stairs in or around the home? No  If so, are there any without  handrails? No  Home free of loose throw rugs in walkways, pet beds, electrical cords, etc? Yes  Adequate lighting in your home to reduce risk of falls? Yes   ASSISTIVE DEVICES UTILIZED TO PREVENT FALLS:  Life alert? No  Use of a cane, walker or w/c? No  Grab bars in the bathroom? No  Shower chair or bench in shower? No  Elevated toilet seat or a handicapped toilet? No   TIMED UP AND GO:  Was the test performed? Yes .  Length of time to ambulate 10 feet: 7 sec.   Gait steady and fast without use of assistive device  Cognitive Function:      Mini-Cog - 09/08/22 0959     Normal clock drawing test? no    How many words correct? 3                Immunizations Immunization History  Administered Date(s) Administered   Fluad Quad(high Dose 65+) 04/29/2021, 03/15/2022   Hepatitis B, ADULT 06/27/2022   Hepb-cpg 07/25/2022   Influenza Split 03/14/2011, 02/09/2012, 02/09/2012   Influenza Whole 03/20/2007, 03/03/2008, 02/22/2009   Influenza,inj,Quad PF,6+ Mos 03/27/2013, 02/19/2015, 04/28/2016, 02/02/2017, 04/05/2018, 03/14/2019, 02/06/2020   Influenza,inj,quad, With Preservative 02/14/2017  Influenza-Unspecified 01/16/2014   PFIZER Comirnaty(Gray Top)Covid-19 Tri-Sucrose Vaccine 03/15/2022   PFIZER(Purple Top)SARS-COV-2 Vaccination 07/25/2019, 08/15/2019, 03/28/2020   PNEUMOCOCCAL CONJUGATE-20 12/14/2020   Pfizer Covid-19 Vaccine Bivalent Booster 39yrs & up 04/29/2021   Pneumococcal Polysaccharide-23 04/30/2012, 11/09/2017   Td 05/15/2001   Tdap 09/12/2011, 12/02/2021   Zoster Recombinat (Shingrix) 04/22/2019, 09/18/2019    TDAP status: Up to date  Flu Vaccine status: Up to date  Pneumococcal vaccine status: Up to date  Covid-19 vaccine status: Information provided on how to obtain vaccines.   Qualifies for Shingles Vaccine? Yes   Zostavax completed No   Shingrix Completed?: Yes  Screening Tests Health Maintenance  Topic Date Due   Medicare Annual Wellness  (AWV)  Never done   COVID-19 Vaccine (6 - 2023-24 season) 06/16/2023 (Originally 05/10/2022)   INFLUENZA VACCINE  12/14/2022   OPHTHALMOLOGY EXAM  02/03/2023   HEMOGLOBIN A1C  03/10/2023   Diabetic kidney evaluation - Urine ACR  03/11/2023   Diabetic kidney evaluation - eGFR measurement  06/14/2023   FOOT EXAM  06/14/2023   MAMMOGRAM  02/03/2024   COLONOSCOPY (Pts 45-53yrs Insurance coverage will need to be confirmed)  09/02/2026   DTaP/Tdap/Td (4 - Td or Tdap) 12/03/2031   Pneumonia Vaccine 2+ Years old  Completed   DEXA SCAN  Completed   Hepatitis C Screening  Completed   Zoster Vaccines- Shingrix  Completed   HPV VACCINES  Aged Out    Health Maintenance  Health Maintenance Due  Topic Date Due   Medicare Annual Wellness (AWV)  Never done    Colorectal cancer screening: Type of screening: Colonoscopy. Completed 2018, see media. Pathology report reviewed. She will reach out to GI for follow up.. Repeat every 5-10 years.   Mammogram status: Completed 9/23. Repeat every year  Bone Density status: Completed 12/2020. Results reflect: Bone density results: OSTEOPENIA. Repeat every 3 years.  Lung Cancer Screening: (Low Dose CT Chest recommended if Age 7-80 years, 30 pack-year currently smoking OR have quit w/in 15years.) does not qualify.   Lung Cancer Screening Referral: She tried smoking one time when she was a teenager and never tried it again.  Additional Screening:  Hepatitis C Screening: does qualify; Completed 2016  Vision Screening: Recommended annual ophthalmology exams for early detection of glaucoma and other disorders of the eye. Is the patient up to date with their annual eye exam?  Yes  Who is the provider or what is the name of the office in which the patient attends annual eye exams? Dr. Fawn Kirk If pt is not established with a provider, would they like to be referred to a provider to establish care? No .   Dental Screening: Recommended annual dental exams  for proper oral hygiene  Community Resource Referral / Chronic Care Management: CRR required this visit?  No   CCM required this visit?  No      Plan:     I have personally reviewed and noted the following in the patient's chart:   Medical and social history Use of alcohol, tobacco or illicit drugs  Current medications and supplements including opioid prescriptions. Patient is not currently taking opioid prescriptions. Functional ability and status Nutritional status Physical activity Advanced directives List of other physicians Hospitalizations, surgeries, and ER visits in previous 12 months Vitals Screenings to include cognitive, depression, and falls Referrals and appointments  In addition, I have reviewed and discussed with patient certain preventive protocols, quality metrics, and best practice recommendations. A written personalized care plan for preventive  services as well as general preventive health recommendations were provided to patient.    Janit Pagan, MD   09/08/2022

## 2022-09-08 ENCOUNTER — Ambulatory Visit (INDEPENDENT_AMBULATORY_CARE_PROVIDER_SITE_OTHER): Payer: Medicare HMO | Admitting: Family Medicine

## 2022-09-08 ENCOUNTER — Encounter: Payer: Self-pay | Admitting: Family Medicine

## 2022-09-08 VITALS — BP 118/79 | HR 77 | Ht 67.0 in | Wt 171.0 lb

## 2022-09-08 DIAGNOSIS — Z0000 Encounter for general adult medical examination without abnormal findings: Secondary | ICD-10-CM | POA: Diagnosis not present

## 2022-09-08 DIAGNOSIS — Z794 Long term (current) use of insulin: Secondary | ICD-10-CM

## 2022-09-08 DIAGNOSIS — E1169 Type 2 diabetes mellitus with other specified complication: Secondary | ICD-10-CM

## 2022-09-08 LAB — POCT GLYCOSYLATED HEMOGLOBIN (HGB A1C): HbA1c, POC (controlled diabetic range): 7.6 % — AB (ref 0.0–7.0)

## 2022-09-08 MED ORDER — SITAGLIPTIN PHOSPHATE 100 MG PO TABS: 100.0000 mg | ORAL_TABLET | Freq: Every day | ORAL | 3 refills | Status: DC

## 2022-09-08 NOTE — Patient Instructions (Signed)
Preventive Care 65 Years and Older, Female Preventive care refers to lifestyle choices and visits with your health care provider that can promote health and wellness. Preventive care visits are also called wellness exams. What can I expect for my preventive care visit? Counseling Your health care provider may ask you questions about your: Medical history, including: Past medical problems. Family medical history. Pregnancy and menstrual history. History of falls. Current health, including: Memory and ability to understand (cognition). Emotional well-being. Home life and relationship well-being. Sexual activity and sexual health. Lifestyle, including: Alcohol, nicotine or tobacco, and drug use. Access to firearms. Diet, exercise, and sleep habits. Work and work environment. Sunscreen use. Safety issues such as seatbelt and bike helmet use. Physical exam Your health care provider will check your: Height and weight. These may be used to calculate your BMI (body mass index). BMI is a measurement that tells if you are at a healthy weight. Waist circumference. This measures the distance around your waistline. This measurement also tells if you are at a healthy weight and may help predict your risk of certain diseases, such as type 2 diabetes and high blood pressure. Heart rate and blood pressure. Body temperature. Skin for abnormal spots. What immunizations do I need?  Vaccines are usually given at various ages, according to a schedule. Your health care provider will recommend vaccines for you based on your age, medical history, and lifestyle or other factors, such as travel or where you work. What tests do I need? Screening Your health care provider may recommend screening tests for certain conditions. This may include: Lipid and cholesterol levels. Hepatitis C test. Hepatitis B test. HIV (human immunodeficiency virus) test. STI (sexually transmitted infection) testing, if you are at  risk. Lung cancer screening. Colorectal cancer screening. Diabetes screening. This is done by checking your blood sugar (glucose) after you have not eaten for a while (fasting). Mammogram. Talk with your health care provider about how often you should have regular mammograms. BRCA-related cancer screening. This may be done if you have a family history of breast, ovarian, tubal, or peritoneal cancers. Bone density scan. This is done to screen for osteoporosis. Talk with your health care provider about your test results, treatment options, and if necessary, the need for more tests. Follow these instructions at home: Eating and drinking  Eat a diet that includes fresh fruits and vegetables, whole grains, lean protein, and low-fat dairy products. Limit your intake of foods with high amounts of sugar, saturated fats, and salt. Take vitamin and mineral supplements as recommended by your health care provider. Do not drink alcohol if your health care provider tells you not to drink. If you drink alcohol: Limit how much you have to 0-1 drink a day. Know how much alcohol is in your drink. In the [place] one drink equals one 12 oz bottle of beer (355 mL), one 5 oz glass of wine (148 mL), or one 1 oz glass of hard liquor (44 mL). Lifestyle Brush your teeth every morning and night with fluoride toothpaste. Floss one time each day. Exercise for at least 30 minutes 5 or more days each week. Do not use any products that contain nicotine or tobacco. These products include cigarettes, chewing tobacco, and vaping devices, such as e-cigarettes. If you need help quitting, ask your health care provider. Do not use drugs. If you are sexually active, practice safe sex. Use a condom or other form of protection in order to prevent STIs. Take aspirin only as told by   your health care provider. Make sure that you understand how much to take and what form to take. Work with your health care provider to find out whether it  is safe and beneficial for you to take aspirin daily. Ask your health care provider if you need to take a cholesterol-lowering medicine (statin). Find healthy ways to manage stress, such as: Meditation, yoga, or listening to music. Journaling. Talking to a trusted person. Spending time with friends and family. Minimize exposure to UV radiation to reduce your risk of skin cancer. Safety Always wear your seat belt while driving or riding in a vehicle. Do not drive: If you have been drinking alcohol. Do not ride with someone who has been drinking. When you are tired or distracted. While texting. If you have been using any mind-altering substances or drugs. Wear a helmet and other protective equipment during sports activities. If you have firearms in your house, make sure you follow all gun safety procedures. What's next? Visit your health care provider once a year for an annual wellness visit. Ask your health care provider how often you should have your eyes and teeth checked. Stay up to date on all vaccines. This information is not intended to replace advice given to you by your health care provider. Make sure you discuss any questions you have with your health care provider. Document Revised: 10/27/2020 Document Reviewed: 10/27/2020 Elsevier Patient Education  2023 Elsevier Inc.   

## 2022-10-16 ENCOUNTER — Encounter: Payer: Self-pay | Admitting: Family Medicine

## 2022-10-17 ENCOUNTER — Ambulatory Visit (INDEPENDENT_AMBULATORY_CARE_PROVIDER_SITE_OTHER): Payer: Medicare HMO | Admitting: Family Medicine

## 2022-10-17 ENCOUNTER — Encounter: Payer: Self-pay | Admitting: Family Medicine

## 2022-10-17 VITALS — BP 116/64 | HR 83 | Ht 67.0 in | Wt 172.4 lb

## 2022-10-17 DIAGNOSIS — L0232 Furuncle of buttock: Secondary | ICD-10-CM | POA: Diagnosis not present

## 2022-10-17 MED ORDER — DOXYCYCLINE HYCLATE 100 MG PO TABS: 100.0000 mg | ORAL_TABLET | Freq: Two times a day (BID) | ORAL | 0 refills | Status: AC

## 2022-10-17 NOTE — Patient Instructions (Addendum)
It was nice seeing you today. You do have an infected skin/boil and I have sent and antibiotic to your pharmacy. Please use warm compression daily on this area to help with the healing. Call me soon if there is no improvement.

## 2022-10-17 NOTE — Assessment & Plan Note (Signed)
Doxycycline BID x 10 days prescribed. Apply daily warm compression. Tylenol as needed for pain. F/U soon if there is no improvement. She agreed with the plan.

## 2022-10-17 NOTE — Progress Notes (Signed)
    SUBJECTIVE:   CHIEF COMPLAINT / HPI:   Boil;  C/O b/l gluteal boil x 1 week, which is getting bigger and more painful. She denies fever. She used OTC topical cream with no improvement. She had episodes in the past which responded well to oral A/B.  PERTINENT  PMH / PSH: PMHx reviewed  OBJECTIVE:   BP 116/64   Pulse 83   Ht 5\' 7"  (1.702 m)   Wt 172 lb 6.4 oz (78.2 kg)   SpO2 100%   BMI 27.00 kg/m   Physical Exam Vitals and nursing note reviewed. Exam conducted with a chaperone present Cleatrice Burke).  Cardiovascular:     Rate and Rhythm: Normal rate and regular rhythm.     Heart sounds: Normal heart sounds.  Pulmonary:     Effort: Pulmonary effort is normal. No respiratory distress.     Breath sounds: Normal breath sounds.  Genitourinary:    Comments: Firm, mildly tender, non-erythematous swelling on her gluteus b/l with no fluctuance.      ASSESSMENT/PLAN:   Boil of buttock Doxycycline BID x 10 days prescribed. Apply daily warm compression. Tylenol as needed for pain. F/U soon if there is no improvement. She agreed with the plan.      Janit Pagan, MD [facility]

## 2022-10-24 ENCOUNTER — Other Ambulatory Visit: Payer: Self-pay | Admitting: Family Medicine

## 2022-10-31 ENCOUNTER — Telehealth: Payer: Self-pay

## 2022-10-31 ENCOUNTER — Telehealth: Payer: Self-pay | Admitting: Family Medicine

## 2022-10-31 DIAGNOSIS — H6993 Unspecified Eustachian tube disorder, bilateral: Secondary | ICD-10-CM | POA: Diagnosis not present

## 2022-10-31 DIAGNOSIS — L299 Pruritus, unspecified: Secondary | ICD-10-CM | POA: Diagnosis not present

## 2022-10-31 NOTE — Telephone Encounter (Signed)
Received VM from patient requesting appointment for "TB shot."  Attempted to call patient to schedule appointment. She did not answer, LVM asking her to call back to office.

## 2022-10-31 NOTE — Telephone Encounter (Signed)
Clinical info completed on Medical Report form.  Placed form in PCP's box for completion.    When form is completed, please route note to "RN Team" and place in wall pocket in front office.   Aquilla Solian, CMA

## 2022-10-31 NOTE — Telephone Encounter (Signed)
patient dropped off form at front desk for Medical Report.  Verified that patient section of form has been completed.  Last DOS/WCC with PCP was 10/17/22.  Placed form in blue team folder to be completed by clinical staff.  Vilinda Blanks

## 2022-11-01 DIAGNOSIS — Z111 Encounter for screening for respiratory tuberculosis: Secondary | ICD-10-CM | POA: Diagnosis not present

## 2022-11-02 NOTE — Telephone Encounter (Signed)
Form placed up front for pick up.  ? ?Copy made for batch scanning.  ? ?VM left informing patient. ?

## 2022-11-03 DIAGNOSIS — Z111 Encounter for screening for respiratory tuberculosis: Secondary | ICD-10-CM | POA: Diagnosis not present

## 2022-11-03 NOTE — Telephone Encounter (Addendum)
I called to discuss with the patient. This was an error on my part.  She will bring paper in today for an update. I apologized for the confusion and she was appreciative of the prompt callback.

## 2022-11-03 NOTE — Telephone Encounter (Signed)
Form updated

## 2022-11-03 NOTE — Telephone Encounter (Signed)
Patient calls nurse line in regards to paperwork.   She reports she picked the paperwork up yesterday. 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

## 2022-11-06 ENCOUNTER — Other Ambulatory Visit: Payer: Self-pay

## 2022-11-07 MED ORDER — METFORMIN HCL 500 MG PO TABS: 500.0000 mg | ORAL_TABLET | Freq: Two times a day (BID) | ORAL | 1 refills | Status: DC

## 2022-11-07 MED ORDER — TRIAMCINOLONE ACETONIDE 0.1 % EX CREA: TOPICAL_CREAM | CUTANEOUS | 0 refills | Status: DC

## 2022-11-07 MED ORDER — ROSUVASTATIN CALCIUM 20 MG PO TABS: 20.0000 mg | ORAL_TABLET | Freq: Every day | ORAL | 1 refills | Status: DC

## 2022-11-15 ENCOUNTER — Encounter: Payer: Self-pay | Admitting: Family Medicine

## 2022-11-17 ENCOUNTER — Encounter: Payer: Self-pay | Admitting: Family Medicine

## 2022-11-17 ENCOUNTER — Ambulatory Visit (INDEPENDENT_AMBULATORY_CARE_PROVIDER_SITE_OTHER): Payer: Medicare HMO | Admitting: Family Medicine

## 2022-11-17 VITALS — BP 130/71 | HR 85 | Ht 67.0 in | Wt 175.8 lb

## 2022-11-17 DIAGNOSIS — Z794 Long term (current) use of insulin: Secondary | ICD-10-CM

## 2022-11-17 DIAGNOSIS — I700 Atherosclerosis of aorta: Secondary | ICD-10-CM | POA: Diagnosis not present

## 2022-11-17 DIAGNOSIS — I10 Essential (primary) hypertension: Secondary | ICD-10-CM | POA: Diagnosis not present

## 2022-11-17 DIAGNOSIS — E1169 Type 2 diabetes mellitus with other specified complication: Secondary | ICD-10-CM | POA: Diagnosis not present

## 2022-11-17 DIAGNOSIS — E113293 Type 2 diabetes mellitus with mild nonproliferative diabetic retinopathy without macular edema, bilateral: Secondary | ICD-10-CM

## 2022-11-17 DIAGNOSIS — E785 Hyperlipidemia, unspecified: Secondary | ICD-10-CM

## 2022-11-17 MED ORDER — LOSARTAN POTASSIUM-HCTZ 100-25 MG PO TABS: 0.5000 | ORAL_TABLET | Freq: Every day | ORAL | 0 refills | Status: DC

## 2022-11-17 NOTE — Assessment & Plan Note (Signed)
Stable on current regimen   

## 2022-11-17 NOTE — Progress Notes (Signed)
    SUBJECTIVE:   CHIEF COMPLAINT / HPI:   HTN: The patient has been unable to pick up BP meds for two weeks and has been off meds for that long. Her current regimen is Losartan/HCTZ 100/25 mg QD. She called her pharmacy and was informed that insurance did not approve her meds.  DM2/HLD/Atherosclerosis: No new concerns. She is compliant with her meds. Here for follow up.  PERTINENT  PMH / PSH: PMHx reviewed  OBJECTIVE:   BP 130/71   Pulse 85   Ht 5\' 7"  (1.702 m)   Wt 175 lb 12.8 oz (79.7 kg)   SpO2 100%   BMI 27.53 kg/m   Physical Exam Cardiovascular:     Rate and Rhythm: Normal rate and regular rhythm.     Heart sounds: Normal heart sounds. No murmur heard. Pulmonary:     Effort: Pulmonary effort is normal. No respiratory distress.     Breath sounds: Normal breath sounds. No wheezing.  Neurological:     Mental Status: She is alert.      ASSESSMENT/PLAN:   Essential hypertension BP looks good despite being off Losartan/hydrochlorothiazide 100/25 mg every day x 2 weeks Plan to half dose and monitor BP closely at home BP goal discussed Start Losartan 50/12.5 mg every day Return in 3-4 weeks for BP check Return sooner if BP is lower than her discussed goal I called her pharmacy after visit and they confirmed that her medication was received and would be process  Type 2 diabetes mellitus with other specified complication (HCC) Stable Return in 1-2 months for A1C check  Hyperlipidemia associated with type 2 diabetes mellitus (HCC) Stable on current regimen  Aortic atherosclerosis (HCC) Problem discussed with her Continue Crestor 20 mg QD  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

## 2022-11-17 NOTE — Assessment & Plan Note (Addendum)
Problem discussed with her Continue Crestor 20 mg QD

## 2022-11-17 NOTE — Assessment & Plan Note (Signed)
BP looks good despite being off Losartan/hydrochlorothiazide 100/25 mg every day x 2 weeks Plan to half dose and monitor BP closely at home BP goal discussed Start Losartan 50/12.5 mg every day Return in 3-4 weeks for BP check Return sooner if BP is lower than her discussed goal I called her pharmacy after visit and they confirmed that her medication was received and would be process

## 2022-11-17 NOTE — Patient Instructions (Signed)
It was nice seeing you today. I am sorry you have not been able to pick up your BP medication. However, your BP looks good despite being off medication for 2 weeks. We will cut back on your BP medication into half and have you return to see in 3-4 weeks.   Please continue to monitor BP at home. Your Blood pressure goal is between 120/70 to 140/90

## 2022-11-17 NOTE — Assessment & Plan Note (Signed)
Compliant with Ophthalmology F/U

## 2022-11-17 NOTE — Assessment & Plan Note (Signed)
Stable Return in 1-2 months for A1C check

## 2022-12-08 ENCOUNTER — Encounter: Payer: Self-pay | Admitting: Family Medicine

## 2022-12-08 ENCOUNTER — Ambulatory Visit (INDEPENDENT_AMBULATORY_CARE_PROVIDER_SITE_OTHER): Payer: Medicare HMO | Admitting: Family Medicine

## 2022-12-08 VITALS — BP 122/71 | HR 97 | Ht 67.0 in | Wt 172.8 lb

## 2022-12-08 DIAGNOSIS — L0232 Furuncle of buttock: Secondary | ICD-10-CM

## 2022-12-08 DIAGNOSIS — I10 Essential (primary) hypertension: Secondary | ICD-10-CM

## 2022-12-08 MED ORDER — LOSARTAN POTASSIUM-HCTZ 50-12.5 MG PO TABS: 1.0000 | ORAL_TABLET | Freq: Every day | ORAL | Status: DC

## 2022-12-08 NOTE — Assessment & Plan Note (Signed)
BP looks good on her current dose Reduce Hyzaar to 50/12.5 mg every day Continue home BP monitoring F/U in 2-3 months

## 2022-12-08 NOTE — Patient Instructions (Signed)
It was nice seeing you today. You BP looks good on half dose of your BP medication. We will continue same for now. See me in 3 months.

## 2022-12-08 NOTE — Assessment & Plan Note (Signed)
Recurrent but no active infection at this time Continue sitz baths and good hygiene She will benefit from surgical eval at some point She wants to monitor for now with conservative measures F/U as needed

## 2022-12-08 NOTE — Progress Notes (Signed)
    SUBJECTIVE:   CHIEF COMPLAINT / HPI:   HTN:  She is here for f/u. She is now taking 1/2 tablet of her Hyzaar 100/25 mg QD. She came in with her home BP machine for review. Home BP in the last few days were 113/63, 103/58, 120/ 75,  115/57, 136/83, and 114/56 on this dose. She denies any concerns regarding her BP.  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 She had been able to keep it under control till recently, when it became more recurrent. Feels like a new boil is growing.  PERTINENT  PMH / PSH: PMHx reviewed  OBJECTIVE:   BP 122/71   Pulse 97   Ht 5\' 7"  (1.702 m)   Wt 172 lb 12.8 oz (78.4 kg)   SpO2 100%   BMI 27.06 kg/m   Physical Exam Vitals and nursing note reviewed. Exam conducted with a chaperone present Kathy Duncan).  Cardiovascular:     Rate and Rhythm: Normal rate and regular rhythm.     Heart sounds: Normal heart sounds. No murmur heard. Pulmonary:     Effort: Pulmonary effort is normal. No respiratory distress.     Breath sounds: Normal breath sounds. No wheezing.  Genitourinary:    Comments: Small healed boil/nodular lesion on her left gluteus, which is non-tender and non-erythematous. Similar but larger nodule of her right gluteus, no tenderness, no erythema, no fluctuance.     ASSESSMENT/PLAN:   Boil of buttock Recurrent but no active infection at this time Continue sitz baths and good hygiene She will benefit from surgical eval at some point She wants to monitor for now with conservative measures F/U as needed  Essential hypertension BP looks good on her current dose Reduce Hyzaar to 50/12.5 mg every day Continue home BP monitoring F/U in 2-3 months      Kathy Pagan, MD [facility]

## 2023-01-09 ENCOUNTER — Encounter (HOSPITAL_COMMUNITY): Payer: Self-pay

## 2023-01-09 ENCOUNTER — Emergency Department (HOSPITAL_COMMUNITY)
Admission: EM | Admit: 2023-01-09 | Discharge: 2023-01-09 | Disposition: A | Discharge status: Home / Self Care | Payer: Medicare HMO | Attending: Emergency Medicine | Admitting: Emergency Medicine

## 2023-01-09 ENCOUNTER — Emergency Department (HOSPITAL_COMMUNITY): Payer: Medicare HMO

## 2023-01-09 ENCOUNTER — Other Ambulatory Visit: Payer: Self-pay

## 2023-01-09 DIAGNOSIS — M542 Cervicalgia: Secondary | ICD-10-CM | POA: Insufficient documentation

## 2023-01-09 DIAGNOSIS — S2242XA Multiple fractures of ribs, left side, initial encounter for closed fracture: Secondary | ICD-10-CM | POA: Insufficient documentation

## 2023-01-09 DIAGNOSIS — Z79899 Other long term (current) drug therapy: Secondary | ICD-10-CM | POA: Diagnosis not present

## 2023-01-09 DIAGNOSIS — Z794 Long term (current) use of insulin: Secondary | ICD-10-CM | POA: Insufficient documentation

## 2023-01-09 DIAGNOSIS — S22089A Unspecified fracture of T11-T12 vertebra, initial encounter for closed fracture: Secondary | ICD-10-CM | POA: Diagnosis not present

## 2023-01-09 DIAGNOSIS — R109 Unspecified abdominal pain: Secondary | ICD-10-CM | POA: Diagnosis not present

## 2023-01-09 DIAGNOSIS — E119 Type 2 diabetes mellitus without complications: Secondary | ICD-10-CM | POA: Insufficient documentation

## 2023-01-09 DIAGNOSIS — R519 Headache, unspecified: Secondary | ICD-10-CM | POA: Diagnosis not present

## 2023-01-09 DIAGNOSIS — S3991XA Unspecified injury of abdomen, initial encounter: Secondary | ICD-10-CM | POA: Diagnosis not present

## 2023-01-09 DIAGNOSIS — S20302A Unspecified superficial injuries of left front wall of thorax, initial encounter: Secondary | ICD-10-CM | POA: Diagnosis present

## 2023-01-09 DIAGNOSIS — Y92410 Unspecified street and highway as the place of occurrence of the external cause: Secondary | ICD-10-CM | POA: Diagnosis not present

## 2023-01-09 DIAGNOSIS — S0990XA Unspecified injury of head, initial encounter: Secondary | ICD-10-CM | POA: Diagnosis not present

## 2023-01-09 DIAGNOSIS — K5900 Constipation, unspecified: Secondary | ICD-10-CM | POA: Diagnosis not present

## 2023-01-09 DIAGNOSIS — I10 Essential (primary) hypertension: Secondary | ICD-10-CM | POA: Insufficient documentation

## 2023-01-09 DIAGNOSIS — I700 Atherosclerosis of aorta: Secondary | ICD-10-CM | POA: Diagnosis not present

## 2023-01-09 DIAGNOSIS — Z7984 Long term (current) use of oral hypoglycemic drugs: Secondary | ICD-10-CM | POA: Insufficient documentation

## 2023-01-09 DIAGNOSIS — Z7982 Long term (current) use of aspirin: Secondary | ICD-10-CM | POA: Diagnosis not present

## 2023-01-09 LAB — CBC WITH DIFFERENTIAL/PLATELET
Abs Immature Granulocytes: 0.03 10*3/uL (ref 0.00–0.07)
Basophils Absolute: 0 10*3/uL (ref 0.0–0.1)
Basophils Relative: 0 %
Eosinophils Absolute: 0.1 10*3/uL (ref 0.0–0.5)
Eosinophils Relative: 2 %
HCT: 38.1 % (ref 36.0–46.0)
Hemoglobin: 11.6 g/dL — ABNORMAL LOW (ref 12.0–15.0)
Immature Granulocytes: 0 %
Lymphocytes Relative: 16 %
Lymphs Abs: 1.3 10*3/uL (ref 0.7–4.0)
MCH: 24.9 pg — ABNORMAL LOW (ref 26.0–34.0)
MCHC: 30.4 g/dL (ref 30.0–36.0)
MCV: 81.9 fL (ref 80.0–100.0)
Monocytes Absolute: 0.3 10*3/uL (ref 0.1–1.0)
Monocytes Relative: 4 %
Neutro Abs: 6.2 10*3/uL (ref 1.7–7.7)
Neutrophils Relative %: 78 %
Platelets: 300 10*3/uL (ref 150–400)
RBC: 4.65 MIL/uL (ref 3.87–5.11)
RDW: 15 % (ref 11.5–15.5)
WBC: 8 10*3/uL (ref 4.0–10.5)
nRBC: 0 % (ref 0.0–0.2)

## 2023-01-09 LAB — COMPREHENSIVE METABOLIC PANEL
ALT: 17 U/L (ref 0–44)
AST: 24 U/L (ref 15–41)
Albumin: 3.8 g/dL (ref 3.5–5.0)
Alkaline Phosphatase: 75 U/L (ref 38–126)
Anion gap: 11 (ref 5–15)
BUN: 17 mg/dL (ref 8–23)
CO2: 25 mmol/L (ref 22–32)
Calcium: 9.2 mg/dL (ref 8.9–10.3)
Chloride: 104 mmol/L (ref 98–111)
Creatinine, Ser: 0.92 mg/dL (ref 0.44–1.00)
GFR, Estimated: >60 mL/min (ref >60)
Glucose, Bld: 156 mg/dL — ABNORMAL HIGH (ref 70–99)
Potassium: 3.6 mmol/L (ref 3.5–5.1)
Sodium: 140 mmol/L (ref 135–145)
Total Bilirubin: 0.4 mg/dL (ref 0.3–1.2)
Total Protein: 7.8 g/dL (ref 6.5–8.1)

## 2023-01-09 LAB — CBG MONITORING, ED: Glucose-Capillary: 164 mg/dL — ABNORMAL HIGH (ref 70–99)

## 2023-01-09 MED ORDER — KETOROLAC TROMETHAMINE 15 MG/ML IJ SOLN
15.0000 mg | Freq: Once | INTRAMUSCULAR | Status: AC
  Administered 2023-01-09: 15 mg via INTRAVENOUS
  Filled 2023-01-09: qty 1

## 2023-01-09 MED ORDER — IOHEXOL 350 MG/ML SOLN
75.0000 mL | Freq: Once | INTRAVENOUS | Status: AC | PRN
  Administered 2023-01-09: 75 mL via INTRAVENOUS

## 2023-01-09 MED ORDER — LIDOCAINE 5 % EX PTCH
1.0000 | MEDICATED_PATCH | CUTANEOUS | Status: DC
  Administered 2023-01-09: 1 via TRANSDERMAL

## 2023-01-09 MED ORDER — TRAMADOL HCL 50 MG PO TABS: 50.0000 mg | ORAL_TABLET | Freq: Three times a day (TID) | ORAL | 0 refills | Status: DC | PRN
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
## 2023-01-22 ENCOUNTER — Telehealth: Payer: Self-pay

## 2023-01-22 NOTE — Telephone Encounter (Signed)
Transition Care Management Unsuccessful Follow-up Telephone Call  Date of discharge and from where:  01/09/2023 [facility]  Attempts:  1st Attempt  Reason for unsuccessful TCM follow-up call:  No answer/busy  Deshon Koslowski [facility]  [facility], [facility] Resource Care Guide Direct Dial: [phone]  Website: [email or web address]

## 2023-01-24 ENCOUNTER — Telehealth: Payer: Self-pay

## 2023-01-24 ENCOUNTER — Other Ambulatory Visit: Payer: Self-pay | Admitting: Family Medicine

## 2023-01-24 DIAGNOSIS — Z1231 Encounter for screening mammogram for malignant neoplasm of breast: Secondary | ICD-10-CM

## 2023-01-24 NOTE — Telephone Encounter (Signed)
Transition Care Management Follow-up Telephone Call Date of discharge and from where: 01/09/2023 [facility] How have you been since you were released from the hospital? Patient stated she is feeling much better, but still has some soreness. Any questions or concerns? No  Items Reviewed: Did the pt receive and understand the discharge instructions provided? Yes  Medications obtained and verified? Yes  Other? No  Any new allergies since your discharge? No  Dietary orders reviewed? Yes Do you have support at home? Yes   Follow up appointments reviewed:  PCP Hospital f/u appt confirmed?  Patient stated she will follow-up with PCP if she is not feeling better.   Scheduled to see  on  @ . Specialist Hospital f/u appt confirmed? No  Scheduled to see  on  @ . Are transportation arrangements needed? No  If their condition worsens, is the pt aware to call PCP or go to the Emergency Dept.? Yes Was the patient provided with contact information for the PCP's office or ED? Yes Was to pt encouraged to call back with questions or concerns? Yes  Kathy Duncan [facility]  [facility], [facility] Guide Direct Dial: [phone]  Website: [email or web address]

## 2023-02-01 ENCOUNTER — Encounter: Payer: Self-pay | Admitting: Internal Medicine

## 2023-02-01 ENCOUNTER — Ambulatory Visit: Payer: Medicare HMO | Attending: Internal Medicine | Admitting: Internal Medicine

## 2023-02-01 VITALS — BP 112/60 | HR 76 | Ht 67.0 in | Wt 173.0 lb

## 2023-02-01 DIAGNOSIS — E785 Hyperlipidemia, unspecified: Secondary | ICD-10-CM

## 2023-02-01 DIAGNOSIS — I10 Essential (primary) hypertension: Secondary | ICD-10-CM

## 2023-02-01 DIAGNOSIS — I700 Atherosclerosis of aorta: Secondary | ICD-10-CM

## 2023-02-01 DIAGNOSIS — E1169 Type 2 diabetes mellitus with other specified complication: Secondary | ICD-10-CM

## 2023-02-01 DIAGNOSIS — I2510 Atherosclerotic heart disease of native coronary artery without angina pectoris: Secondary | ICD-10-CM

## 2023-02-01 NOTE — Patient Instructions (Signed)
Medication Instructions:  Your physician recommends that you continue on your current medications as directed. Please refer to the Current Medication list given to you today.  *If you need a refill on your cardiac medications before your next appointment, please call your pharmacy*   Lab Work: NONE If you have labs (blood work) drawn today and your tests are completely normal, you will receive your results only by: MyChart Message (if you have MyChart) OR A paper copy in the mail If you have any lab test that is abnormal or we need to change your treatment, we will call you to review the results.   Testing/Procedures: NONE   Follow-Up:As Needed At [facility], you and your health needs are our priority.  As part of our continuing mission to provide you with exceptional heart care, we have created designated Provider Care Teams.  These Care Teams include your primary Cardiologist (physician) and Advanced Practice Providers (APPs -  Physician Assistants and Nurse Practitioners) who all work together to provide you with the care you need, when you need it.  We recommend signing up for the patient portal called "MyChart".  Sign up information is provided on this After Visit Summary.  MyChart is used to connect with patients for Virtual Visits (Telemedicine).  Patients are able to view lab/test results, encounter notes, upcoming appointments, etc.  Non-urgent messages can be sent to your provider as well.   To learn more about what you can do with MyChart, go to [email or web address].     Provider:   Riley Lam, MD
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
## 2023-02-05 ENCOUNTER — Encounter (INDEPENDENT_AMBULATORY_CARE_PROVIDER_SITE_OTHER): Payer: Managed Care, Other (non HMO) | Admitting: Ophthalmology

## 2023-02-05 DIAGNOSIS — H43813 Vitreous degeneration, bilateral: Secondary | ICD-10-CM | POA: Diagnosis not present

## 2023-02-05 DIAGNOSIS — E113293 Type 2 diabetes mellitus with mild nonproliferative diabetic retinopathy without macular edema, bilateral: Secondary | ICD-10-CM | POA: Diagnosis not present

## 2023-02-05 LAB — HM DIABETES EYE EXAM

## 2023-02-13 ENCOUNTER — Other Ambulatory Visit: Payer: Self-pay | Admitting: Family Medicine

## 2023-02-13 MED ORDER — LOSARTAN POTASSIUM-HCTZ 50-12.5 MG PO TABS: 1.0000 | ORAL_TABLET | Freq: Every day | ORAL | 1 refills | Status: DC

## 2023-02-21 ENCOUNTER — Ambulatory Visit
Admission: RE | Admit: 2023-02-21 | Discharge: 2023-02-21 | Disposition: A | Discharge status: Home / Self Care | Payer: Medicare HMO | Source: Ambulatory Visit | Attending: Family Medicine | Admitting: Family Medicine

## 2023-02-21 DIAGNOSIS — Z1231 Encounter for screening mammogram for malignant neoplasm of breast: Secondary | ICD-10-CM | POA: Diagnosis not present

## 2023-03-06 ENCOUNTER — Ambulatory Visit (INDEPENDENT_AMBULATORY_CARE_PROVIDER_SITE_OTHER): Payer: Medicare HMO | Admitting: Family Medicine

## 2023-03-06 ENCOUNTER — Encounter: Payer: Self-pay | Admitting: Family Medicine

## 2023-03-06 ENCOUNTER — Other Ambulatory Visit: Payer: Self-pay | Admitting: Family Medicine

## 2023-03-06 VITALS — BP 133/68 | HR 75 | Ht 67.0 in | Wt 176.6 lb

## 2023-03-06 DIAGNOSIS — E1169 Type 2 diabetes mellitus with other specified complication: Secondary | ICD-10-CM | POA: Diagnosis not present

## 2023-03-06 DIAGNOSIS — Z794 Long term (current) use of insulin: Secondary | ICD-10-CM | POA: Diagnosis not present

## 2023-03-06 DIAGNOSIS — E113293 Type 2 diabetes mellitus with mild nonproliferative diabetic retinopathy without macular edema, bilateral: Secondary | ICD-10-CM

## 2023-03-06 DIAGNOSIS — L309 Dermatitis, unspecified: Secondary | ICD-10-CM

## 2023-03-06 DIAGNOSIS — Z23 Encounter for immunization: Secondary | ICD-10-CM

## 2023-03-06 DIAGNOSIS — E785 Hyperlipidemia, unspecified: Secondary | ICD-10-CM

## 2023-03-06 LAB — POCT GLYCOSYLATED HEMOGLOBIN (HGB A1C): HbA1c, POC (controlled diabetic range): 7.4 % — AB (ref 0.0–7.0)

## 2023-03-06 MED ORDER — HALOBETASOL PROPIONATE 0.05 % EX CREA: TOPICAL_CREAM | Freq: Two times a day (BID) | CUTANEOUS | 0 refills | Status: DC

## 2023-03-06 NOTE — Assessment & Plan Note (Signed)
Inguinal rash Likely Lichen sclerosis Trial of higher potency steroid Halobetasol/Clobetasol not covered by her insurance Betamethasone escribed  F/U in 2 weeks if there is no improvement

## 2023-03-06 NOTE — Progress Notes (Signed)
    SUBJECTIVE:   CHIEF COMPLAINT / HPI:   DM2: She is here for f/u. She is compliant with her Lantus 20 units daily, Metformin 500 mg BID. Her home CBG ranges from 86, to 100. Denies hypoglycemic episodes.   Rash:  C/O itchy rash on her groin x 2 weeks. The rash is itchy and does not respond to her triamcinolone. She used Neosporing as well, with no improvement.   PERTINENT  PMH / PSH: PMHx reviewed  OBJECTIVE:   BP 133/68   Pulse 75   Ht 5\' 7"  (1.702 m)   Wt 176 lb 9.6 oz (80.1 kg)   SpO2 100%   BMI 27.66 kg/m   Physical Exam Vitals and nursing note reviewed. Exam conducted with a chaperone present Cleatrice Burke).  Cardiovascular:     Rate and Rhythm: Normal rate and regular rhythm.     Heart sounds: Normal heart sounds. No murmur heard. Pulmonary:     Effort: Pulmonary effort is normal. No respiratory distress.     Breath sounds: Normal breath sounds. No wheezing.  Genitourinary:      Comments: Whitish, hypopigmented patch on her inguinal fold B/L, worse on the right side.      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

## 2023-03-06 NOTE — Assessment & Plan Note (Signed)
Stable on Crestor for HLD

## 2023-03-06 NOTE — Patient Instructions (Signed)
Nice seeing you today. You A1C improved from the previous. We will continue your current diabetes regimen and recheck A1C in 3-4 months.

## 2023-03-06 NOTE — Assessment & Plan Note (Signed)
Previously documented She stated that she had a normal eye exam completed with her ophthalmologist last month ROI signed and we will obtain record from her eye doctor Continue tight glycemic control

## 2023-03-06 NOTE — Assessment & Plan Note (Signed)
A1C improved close to goal No medication adjustment for now F/U in 3-4 months for reassessment She agreed with the plan

## 2023-03-07 ENCOUNTER — Encounter: Payer: Self-pay | Admitting: Family Medicine

## 2023-03-07 LAB — MICROALBUMIN / CREATININE URINE RATIO
Creatinine, Urine: 31.5 mg/dL
Microalb/Creat Ratio: <10 mg/g{creat} (ref 0–29)
Microalbumin, Urine: <3 ug/mL

## 2023-03-27 ENCOUNTER — Other Ambulatory Visit: Payer: Self-pay | Admitting: Family Medicine

## 2023-04-05 ENCOUNTER — Other Ambulatory Visit: Payer: Self-pay

## 2023-04-05 MED ORDER — LOSARTAN POTASSIUM-HCTZ 50-12.5 MG PO TABS: 1.0000 | ORAL_TABLET | Freq: Every day | ORAL | 1 refills | Status: DC

## 2023-04-06 ENCOUNTER — Telehealth: Payer: Self-pay

## 2023-04-06 NOTE — Patient Outreach (Signed)
Attempted to contact patient regarding care gaps. Left voicemail for patient to return my call at [phone].  Nicholes Rough, CMA Care Guide [employer]

## 2023-05-11 ENCOUNTER — Telehealth: Payer: Self-pay | Admitting: Family Medicine

## 2023-05-11 ENCOUNTER — Other Ambulatory Visit: Payer: Self-pay | Admitting: Family Medicine

## 2023-05-11 MED ORDER — BD PEN NEEDLE NANO 2ND GEN 32G X 4 MM MISC: 1 refills | Status: DC

## 2023-05-11 MED ORDER — INSULIN DEGLUDEC 100 UNIT/ML ~~LOC~~ SOPN: 20.0000 [IU] | PEN_INJECTOR | Freq: Every day | SUBCUTANEOUS | 1 refills | Status: DC

## 2023-05-11 NOTE — Telephone Encounter (Signed)
I received a message from her pharmacy that her insurance no longer covers Lantus Solostar. I discussed options with her, including switching to insulin injection vs Tresibar pen. She opted for a [employer]. She can start at the same dose of 20 units daily and reduce the dose by 2 units if her CBG is consistently below 100. She agreed with the plan and read back the instructions provided to her. I called her pharmacy and spoke with Romeo Apple to clarify her prescription and to d/c Lantus. Ben read back the order provided.

## 2023-05-28 ENCOUNTER — Encounter: Payer: Self-pay | Admitting: Family Medicine

## 2023-05-28 ENCOUNTER — Ambulatory Visit (INDEPENDENT_AMBULATORY_CARE_PROVIDER_SITE_OTHER): Payer: Medicare HMO | Admitting: Family Medicine

## 2023-05-28 VITALS — BP 123/70 | HR 90 | Ht 67.0 in | Wt 179.0 lb

## 2023-05-28 DIAGNOSIS — M7918 Myalgia, other site: Secondary | ICD-10-CM

## 2023-05-28 DIAGNOSIS — N898 Other specified noninflammatory disorders of vagina: Secondary | ICD-10-CM

## 2023-05-28 MED ORDER — FLUCONAZOLE 150 MG PO TABS
150.0000 mg | ORAL_TABLET | Freq: Once | ORAL | 0 refills | Status: AC
Start: 2023-05-28 — End: 2023-05-28

## 2023-05-28 NOTE — Patient Instructions (Addendum)
 It was wonderful to see you today.  Please bring ALL of your medications with you to every visit.   Today we talked about:  Lets try stopping your Farxiga , and I will send a one time diflucan  pill to your pharmacy to see if this helps with the itching. It does not look like a typical yeast infection but could be related.  I am going to send you to an OB GYN doctor to get another opinion for your vaginal itching as this does not look like infection, is not responding to steroids, and may need a biopsy.   The pain in your bottom does not look like any infection or skin issue. We can keep an eye on this and follow up with PCP.  Please make a follow up in 2 weeks with your PCP to discuss the Farxiga   Thank you for choosing [facility].   Please call [phone] with any questions about today's appointment.  Please arrive at least 15 minutes prior to your scheduled appointments.   If you had blood work today, I will send you a MyChart message or a letter if results are normal. Otherwise, I will give you a call.   If you had a referral placed, they will call you to set up an appointment. Please give us  a call if you don't hear back in the next 2 weeks.   If you need additional refills before your next appointment, please call your pharmacy first.   Rollene Keeling, MD  Family Medicine

## 2023-05-28 NOTE — Progress Notes (Signed)
    SUBJECTIVE:   CHIEF COMPLAINT / HPI:   Vaginal irritation-  Betamethasone  prescribed for inguinal rash in October, has not been working. Still itching often at night and has been using daily.   Pain in bottom- x several month, spot sore to stand/sit on. NO trauma or fall. No rashes or drainage there. No fevers or chills. No pain radiating down leg, no back pain. No movement really makes it worse, no bowel or bladder incontinence, no saddle paresthesia, no leg wakness or numbess. Notices most when sitting.  PERTINENT  PMH / PSH: HTN, T2DM on insulin   OBJECTIVE:   BP 123/70   Pulse 90   Ht 5' 7 (1.702 m)   Wt 179 lb (81.2 kg)   SpO2 100%   BMI 28.04 kg/m   General: alert & oriented, no apparent distress, well groomed HEENT: normocephalic, atraumatic, EOM grossly intact, oral mucosa moist, neck supple Respiratory: normal respiratory effort GI: non-distended Skin: no rashes, no jaundice Psych: appropriate mood and affect  GU: Right labia majora thickened, without erythema or drainage, atrophy noted in folds between labia majora and minora on R side. Left labia majora and minora with normal appearance. Area of thinned pink skin at 7 oc'lock to vaginal introitus. No redness or satellite lesions, no masses or ulcers appreciated. NO bleeding. Vagina tissue at introitus pink, moist, without lesions or abrasions.  Buttock: Right buttock without induration, erythema, mass or skin changes. NO palpable nodules. Pinpoint TTP at base of R buttock in gluteus muscle. MSK: strength 5/5 in bilateral lower extremities without pain.  Harlene Reiter CMA present as chaperone for pelvic/buttock exam.    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   Kathy FORBES Keeling, MD [facility]

## 2023-06-11 NOTE — Progress Notes (Unsigned)
GYNECOLOGY  VISIT   HPI: Kathy Duncan is a 68 y.o.   Married female G2P2 here for vaginal irritation.     Patient referred by PCP for vaginal itching with recent trial diflucan and betamethasone ointment x3 months.  The patient reports vulvar itching and dryness for the past two years, feels like "there are scratches there." She has intermittent, mild pain on the inside of vagina that comes and goes. She thinks diflucan and steroid cream have helped a little bit, but itching persists. She has not been sexually active since 2019 and has been tested for STIs since then.   Denies vulvar burning, dysuria, urinary frequency/urgency, abnormal vaginal discharge, vaginal malodor, hematuria  GYNECOLOGIC HISTORY: Contraception: S/p abdominal hysterectomy (2001) Last mammogram: 02/21/23 with normal result. 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 No known first/second degree relatives with hx breast cancer.  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) 04/29/2021   Essential hypertension 11/01/2020   Coronary artery disease involving native coronary artery of native heart without angina pectoris 06/17/2020   Aortic atherosclerosis (HCC) 06/17/2020   Retinal hemorrhage of left eye 05/04/2020   Atypical ductal hyperplasia of breast 12/15/2010    Hyperlipidemia associated with type 2 diabetes mellitus (HCC) 12/28/2006   CARPAL TUNNEL SYNDROME, BILATERAL 12/28/2006   OBESITY, NOS 07/12/2006    Past Medical History:  Diagnosis Date   Abnormal vaginal bleeding 07/29/2013   Anemia 07/12/2006   09/12/11: Anemia panel not suggestive of iron or B12 deficiency.  CBC with diff shows no pancytopenia.  Colonoscopy 2008 and referral in 2010 for heme positive stools not suggestive of need for further evaluation.  Most likely due to tamoxifen.    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   Vitreomacular adhesion of right eye 05/04/2020   Weight loss 07/09/2015    Past Surgical History:  Procedure Laterality Date   ABDOMINAL HYSTERECTOMY     has ovaries   BREAST EXCISIONAL BIOPSY Right May 2011   TOTAL KNEE ARTHROPLASTY Right 03/01/2017   Procedure: RIGHT TOTAL KNEE ARTHROPLASTY;  Surgeon: Jene Every, MD;  Location: [facility];  Service: Orthopedics;  Laterality: Right;  120 mins with abductor block    Current Outpatient Medications  Medication Sig  Dispense Refill   aspirin EC 81 MG tablet Take 1 tablet (81 mg total) by mouth daily. Swallow whole. 90 tablet 2   augmented betamethasone dipropionate (DIPROLENE-AF) 0.05 % cream Apply BID to the affected skin 50 g 0   cholecalciferol (VITAMIN D3) 25 MCG (1000 UNIT) tablet Take 1,000 Units by mouth daily.     dapagliflozin propanediol (FARXIGA) 10 MG TABS tablet Take 1 tablet (10 mg total) by mouth daily. 90 tablet 2   diphenhydrAMINE (BENADRYL) 25 MG tablet Take 1 tablet (25 mg total) by mouth every 8 (eight) hours as needed. 30 tablet 0   ferrous sulfate 325 (65 FE) MG tablet Take 1 tablet (325 mg total) by mouth daily with breakfast. 30 tablet 5    insulin degludec (TRESIBA) 100 UNIT/ML FlexTouch Pen Inject 20 Units into the skin daily. 15 mL 1   Insulin Pen Needle (BD PEN NEEDLE NANO 2ND GEN) 32G X 4 MM MISC Use to inject insulin as instructed 300 each 1   Lancets (ONETOUCH DELICA PLUS LANCET33G) MISC USE TO CHECK BLOOD SUGAR 3 TIMES A DAY 100 each 12   losartan-hydrochlorothiazide (HYZAAR) 50-12.5 MG tablet Take 1 tablet by mouth daily. 90 tablet 1   metFORMIN (GLUCOPHAGE) 500 MG tablet Take 1 tablet (500 mg total) by mouth 2 (two) times daily with a meal. 180 tablet 1   nitroGLYCERIN (NITROSTAT) 0.4 MG SL tablet Place 1 tablet (0.4 mg total) under the tongue every 5 (five) minutes as needed for chest pain. 25 tablet 3   ONETOUCH VERIO test strip USE TO CHECK BLOOD SUGAR 3 TIMES A DAY 300 strip 4   rosuvastatin (CRESTOR) 20 MG tablet Take 1 tablet (20 mg total) by mouth daily. 90 tablet 1   sitaGLIPtin (JANUVIA) 100 MG tablet TAKE 1 TABLET BY MOUTH EVERY DAY 90 tablet 1   No current facility-administered medications for this visit.     ALLERGIES: Ace inhibitors  Family History  Problem Relation Age of Onset   Heart disease Mother    Stroke Father    Cancer Brother        Lung   Heart disease Sister     Social History   Socioeconomic History   Marital status: Married    Spouse name: Not on file   Number of children: Not on file   Years of education: Not on file   Highest education level: Not on file  Occupational History   Not on file  Tobacco Use   Smoking status: Never   Smokeless tobacco: Never  Vaping Use   Vaping status: Never Used  Substance and Sexual Activity   Alcohol use: Yes   Drug use: No   Sexual activity: Not Currently    Comment: 1st intercourse 68 yo-Fewer than 5 partners  Other Topics Concern   Not on file  Social History Narrative   Works at [employer], does some heavy lifting.  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   Social Drivers of Corporate investment banker Strain: Not on file   Food Insecurity: Low Risk  (10/31/2022)   [facility]   Hunger Vital Sign    Worried About Running Out of Food in the Last Year: Never true    Ran Out of Food in the Last Year: Never true  Transportation Needs: No Transportation Needs (10/31/2022)   [facility]   Transportation    In the past 12 months, has lack of  reliable transportation kept you from medical appointments, meetings, work or from getting things needed for daily living? : No  Physical Activity: Sufficiently Active (09/07/2022)   Exercise Vital Sign    Days of Exercise per Week: 4 days    Minutes of Exercise per Session: 70 min  Stress: Not on file  Social Connections: Not on file  Intimate Partner Violence: Not on file    Review of Systems  PHYSICAL EXAMINATION:    There were no vitals taken for this visit.    General appearance: alert, cooperative and appears stated age Head: Normocephalic, without obvious abnormality, atraumatic Neck: no adenopathy, supple, symmetrical, trachea midline and thyroid normal to inspection and palpation Lungs: clear to auscultation bilaterally Breasts:  Heart: regular rate and rhythm Abdomen: soft, non-tender, no masses,  no organomegaly Extremities: extremities normal, atraumatic, no cyanosis or edema Skin: Skin color, texture, turgor normal. No rashes or lesions Lymph nodes: No abnormal inguinal nodes palpated Neurologic: Grossly normal Pelvic: External genitalia: +vulvovaginal pallor, loss of vulvar architecture between labia minora and majora, decreased elasticity, mild TTP of L labia minora. No lesions, hyperkeratinization, notable fissures, petechiae, bleeding, abnormal discharge.                Urethra:  normal appearing urethra with no masses, tenderness or lesions              Bartholins and Skenes: normal                 Vagina: normal appearing vagina with normal color and discharge, no lesions               Cervix: not present  Chaperone was present for exam  ASSESSMENT & PLAN   The patient is a 68 yo female presenting for continued vaginal irritation and itching s/p trial betamethasone x3 months for suspected lichen sclerosus and trial diflucan.   1. Vaginal irritation (Primary) 2. Atrophic vaginitis Positive findings on pelvic exam appear normal for age. No lesions, plaques, papules, ulcers, areas of hyperkeratinization or hypo/hyperpigmentation that suggests overt lichen sclerosus. Negative UA. Will initiate alternative steroid course x 3 months for acute vulvar pruritus and irritation.  -Discussed use of supportive care measures including gently cleansing affected areas with mild, unscented soap and warm water only, patting dry, wearing loose cotton underwear, avoiding irritants like tight clothing and scented products, avoiding douching, washcloths, or any topical wipes.  - clobetasol ointment (TEMOVATE) 0.05 %; Apply 1 Application topically daily. Apply to affected area  Dispense: 90 g; Refill: 0 - POCT Urinalysis Dipstick - Urine Culture  3. Breast cancer screening by mammogram S/p R breast excisional biopsy (2011) d/t atypical ductal hyperplasia, previously on tamoxifen. Last mammogram 02/2023 with normal result.  - MM 3D SCREENING MAMMOGRAM BILATERAL BREAST; Future  An After Visit Summary was printed and given to the patient.  Ralene Muskrat, [place] 1/27/20258:01 PM

## 2023-06-12 ENCOUNTER — Encounter: Payer: Self-pay | Admitting: Family Medicine

## 2023-06-12 ENCOUNTER — Ambulatory Visit: Payer: Medicare HMO | Admitting: Family Medicine

## 2023-06-12 VITALS — BP 147/73 | HR 89 | Ht 67.0 in | Wt 181.2 lb

## 2023-06-12 DIAGNOSIS — Z794 Long term (current) use of insulin: Secondary | ICD-10-CM | POA: Diagnosis not present

## 2023-06-12 DIAGNOSIS — N9089 Other specified noninflammatory disorders of vulva and perineum: Secondary | ICD-10-CM | POA: Insufficient documentation

## 2023-06-12 DIAGNOSIS — E1169 Type 2 diabetes mellitus with other specified complication: Secondary | ICD-10-CM

## 2023-06-12 DIAGNOSIS — E785 Hyperlipidemia, unspecified: Secondary | ICD-10-CM

## 2023-06-12 DIAGNOSIS — I700 Atherosclerosis of aorta: Secondary | ICD-10-CM

## 2023-06-12 DIAGNOSIS — I10 Essential (primary) hypertension: Secondary | ICD-10-CM

## 2023-06-12 LAB — POCT GLYCOSYLATED HEMOGLOBIN (HGB A1C): HbA1c, POC (controlled diabetic range): 7.4 % — AB (ref 0.0–7.0)

## 2023-06-12 NOTE — Assessment & Plan Note (Signed)
FLP checked Continue Crestor

## 2023-06-12 NOTE — Assessment & Plan Note (Signed)
Pelvic exam offered She declined She stated that she would wait to be seen by Gyn I suspect this lesion is less likely candida based on previous exam and no vaginal discharge However, she prefers to keep holding [place] till she sees Gyn for reassessment F/U with Gyn as planned

## 2023-06-12 NOTE — Assessment & Plan Note (Addendum)
A1C remains at 7.4 Keep holding Farxiga Repeat A1C in 3-4 months Renal benefit of Marcelline Deist discussed She prefers to hold med.  FLP checked Continue Crestor

## 2023-06-12 NOTE — Assessment & Plan Note (Signed)
A1C 7.4 today She is holding [place] for now

## 2023-06-12 NOTE — Progress Notes (Deleted)
    SUBJECTIVE:   CHIEF COMPLAINT / HPI:   ***  PERTINENT  PMH / PSH: ***  OBJECTIVE:   BP (!) 154/76   Pulse 89   Ht 5\' 7"  (1.702 m)   Wt 181 lb 3.2 oz (82.2 kg)   SpO2 99%   BMI 28.38 kg/m   ***  ASSESSMENT/PLAN:   No problem-specific Assessment & Plan notes found for this encounter.     Janit Pagan, MD [facility]

## 2023-06-12 NOTE — Progress Notes (Signed)
    SUBJECTIVE:   CHIEF COMPLAINT / HPI:   DM Overall, patient reports doing well. Average blood sugar since discontinuing farxiga in the 110s. Had two highs of 158 and 153.  Vaginal Itching Topical steroid and diflucan helped a little, but still itchy. When bathing can feel scratches and friction. Has a GYN appt with Dr. Earlene Plater on Thursday 1/30. Denies seeing blood when wiping. Denies excess discharge. Denies sexual activity.   Health Maintenance: Last lipid panel on 06/13/22.    PERTINENT  PMH / PSH: DM, HLD  OBJECTIVE:   BP (!) 147/73   Pulse 89   Ht 5\' 7"  (1.702 m)   Wt 181 lb 3.2 oz (82.2 kg)   SpO2 99%   BMI 28.38 kg/m   Gen: well-appearing, pleasant and cooperative CV: RRR Pulm: CTAB Abd: Soft, nontender, nondistended with active bowel sounds throughout Neuro: grossly non-focal   ASSESSMENT/PLAN:    DM DM seems reasonably well-controlled given recorded at-home blood sugars predominantly in the 80-110s range after d/c farxiga. A1C is 7.4% which is almost at goal of <7%. Had some recorded at-home blood sugars in the 60s-80s prior to stopping farxiga. Plan: Hold farxiga and recheck A1C in 3-4 months. If still at goal A1C, can consider discontinuing farxiga indefinitely with closely monitored A1C checks every 3 months.   Vaginal Itching Vaginal itching Ddx originally considered yeast infection vs lichen sclerosus. Yeast infection is a common side effect of farxiga. However, given minimal improvement on diflucan as well as absence of discharge, more likely to be a chronic pathology such as lichen sclerosus. Plan: follow up appointment with gyn on Thursday 1/30 for further workup and management.   Health Maintenance: Patient received lipid panel labwork today.   Syliva Overman, MS3 [facility]

## 2023-06-12 NOTE — Patient Instructions (Addendum)
It was nice seeing you today. You A1C is close to goal. We will continue to hold your [place]. Follow up with Gyn as planned for the vulva lesion. I will see you back in 3-4 months for A1C check. We will test your lipids today. Please call if you have any questions.

## 2023-06-12 NOTE — Assessment & Plan Note (Signed)
Her BP is slightly above goal However, her home BP readings looks good We will keep her current BP regimen Monitor for now

## 2023-06-13 ENCOUNTER — Telehealth: Payer: Self-pay | Admitting: Family Medicine

## 2023-06-13 LAB — LIPID PANEL
Chol/HDL Ratio: 2.2 {ratio} (ref 0.0–4.4)
Cholesterol, Total: 132 mg/dL (ref 100–199)
HDL: 59 mg/dL (ref >39)
LDL Chol Calc (NIH): 61 mg/dL (ref 0–99)
Triglycerides: 57 mg/dL (ref 0–149)
VLDL Cholesterol Cal: 12 mg/dL (ref 5–40)

## 2023-06-13 NOTE — Telephone Encounter (Signed)
FLP result discussed with her. Continue current dose of Crestor.

## 2023-06-14 ENCOUNTER — Encounter: Payer: Self-pay | Admitting: Physician Assistant

## 2023-06-14 ENCOUNTER — Ambulatory Visit: Payer: Medicare HMO | Admitting: Physician Assistant

## 2023-06-14 VITALS — BP 155/84 | HR 87 | Ht 67.0 in | Wt 179.6 lb

## 2023-06-14 DIAGNOSIS — Z1231 Encounter for screening mammogram for malignant neoplasm of breast: Secondary | ICD-10-CM | POA: Diagnosis not present

## 2023-06-14 DIAGNOSIS — N898 Other specified noninflammatory disorders of vagina: Secondary | ICD-10-CM

## 2023-06-14 DIAGNOSIS — N952 Postmenopausal atrophic vaginitis: Secondary | ICD-10-CM | POA: Diagnosis not present

## 2023-06-14 LAB — POCT URINALYSIS DIPSTICK
Bilirubin, UA: NEGATIVE
Blood, UA: NEGATIVE
Glucose, UA: NEGATIVE
Ketones, UA: NEGATIVE
Nitrite, UA: NEGATIVE
Protein, UA: POSITIVE — AB
Spec Grav, UA: 1.015 (ref 1.010–1.025)
Urobilinogen, UA: NEGATIVE U/dL — AB
pH, UA: 6.5 (ref 5.0–8.0)

## 2023-06-14 MED ORDER — CLOBETASOL PROPIONATE 0.05 % EX OINT
1.0000 | TOPICAL_OINTMENT | Freq: Every day | CUTANEOUS | 0 refills | Status: AC
Start: 2023-06-14 — End: 2023-09-12

## 2023-06-14 MED ORDER — CLOBETASOL PROPIONATE 0.05 % EX OINT: 1.0000 | TOPICAL_OINTMENT | Freq: Two times a day (BID) | CUTANEOUS | 2 refills | Status: DC

## 2023-06-14 NOTE — Progress Notes (Signed)
Last mammo Sept/Oct 2024. WNL Last bone density several years ago. Has sore right buttock. Painful.

## 2023-06-19 ENCOUNTER — Telehealth: Payer: Self-pay

## 2023-06-19 DIAGNOSIS — R69 Illness, unspecified: Secondary | ICD-10-CM | POA: Diagnosis not present

## 2023-06-19 NOTE — Telephone Encounter (Signed)
Patient is coming in on 02/11 @150  seeing Dr.Jones. Patient stated that she is having right buttock pain while sitting and she limp sometime when she walks.

## 2023-06-19 NOTE — Telephone Encounter (Signed)
Patient stated she can not do Monday, Wednesdays or Fridays. She's only available on Tuesdays and Thursdays. She wanted a afternoon appointment on Tuesday.

## 2023-06-26 ENCOUNTER — Ambulatory Visit: Payer: Medicare HMO

## 2023-06-26 ENCOUNTER — Ambulatory Visit
Admission: RE | Admit: 2023-06-26 | Discharge: 2023-06-26 | Disposition: A | Discharge status: Home / Self Care | Payer: Medicare HMO | Source: Ambulatory Visit | Attending: Family Medicine | Admitting: Family Medicine

## 2023-06-26 VITALS — BP 144/84 | HR 88 | Ht 67.0 in | Wt 178.8 lb

## 2023-06-26 DIAGNOSIS — I10 Essential (primary) hypertension: Secondary | ICD-10-CM | POA: Diagnosis not present

## 2023-06-26 DIAGNOSIS — M19072 Primary osteoarthritis, left ankle and foot: Secondary | ICD-10-CM | POA: Diagnosis not present

## 2023-06-26 DIAGNOSIS — M79605 Pain in left leg: Secondary | ICD-10-CM | POA: Diagnosis not present

## 2023-06-26 NOTE — Patient Instructions (Addendum)
It was great to see you! Thank you for allowing me to participate in your care!  I recommend that you always bring your medications to each appointment as this makes it easy to ensure you are on the correct medications and helps [place] not miss when refills are needed.  Our plans for today:  - Get over the counter Voltaren (diclofenac) gel, use up to 4 times day - Take up to 1000 mg tylenol every 6 hours as needed  - you will be contacted to schedule physical therapy  - Return in 2-4 weeks for follow up and blood pressure management   Take care and seek immediate care sooner if you develop any concerns.   Dr. Erick Alley, DO [facility]

## 2023-06-26 NOTE — Progress Notes (Unsigned)
    SUBJECTIVE:   CHIEF COMPLAINT / HPI:   L Leg pain  Intermittent for past 2 years. Goes from knee to ankle - is achy in nature, worse with walking.  Better with rest. Not currently in pain.  Takes tylenol 500 mg prn (not regularly) but doesn't help and a topical roll on (? Name) Had R knee replacement for OA in 2018.   PERTINENT  PMH / PSH: OA R knee  OBJECTIVE:   BP (!) 144/84   Pulse 88   Ht 5\' 7"  (1.702 m)   Wt 178 lb 12.8 oz (81.1 kg)   SpO2 100%   BMI 28.00 kg/m    General: NAD, pleasant, able to participate in exam Cardiac: RRR, no murmurs. Respiratory: CTAB, normal effort, No wheezes, rales or rhonchi Extremities: No edema, warmth, erythema or deformity of L knee. 5/5 MS of L knee with mild pain from knee to ankle with flexion. Mildly decreased ROM in flexion of L knee d/t pain, able to fully extend without pain. Mild TTP of Lateral joint line of L knee. No TTP or percussion of L tibia Skin: warm and dry Neuro: alert, no obvious focal deficits Psych: Normal affect and mood  ASSESSMENT/PLAN:   Left leg pain Intermittent and chronic going from knee to ankle. No pain at this moment. I suspect L knee OA causing knee pain which radiates to ankle given hx of R knee OA requiring knee replacement. Low concern for malignancy or joint infection given chronicity and exam. Will trial topical NSAID, increase tylenol, confirm with imaging and pt would like to trial PT -OTC voltaren gel up to 4x/day -tylenol 1000 mg q6h prn -referral to PT -L knee xray -return in ~2 weeks for follow up, can consider oral NSAIDs if needed   Essential hypertension Uncontrolled today with SBP slightly elevated to 144. Plan for repeat at next visit in ~2 weeks. Can change BP meds if indicated.      Dr. Erick Alley, DO [facility]

## 2023-06-27 DIAGNOSIS — M79605 Pain in left leg: Secondary | ICD-10-CM | POA: Insufficient documentation

## 2023-06-27 NOTE — Assessment & Plan Note (Signed)
Intermittent and chronic going from knee to ankle. No pain at this moment. I suspect L knee OA causing knee pain which radiates to ankle given hx of R knee OA requiring knee replacement. Low concern for malignancy or joint infection given chronicity and exam. Will trial topical NSAID, increase tylenol, confirm with imaging and pt would like to trial PT -OTC voltaren gel up to 4x/day -tylenol 1000 mg q6h prn -referral to PT -L knee xray -return in ~2 weeks for follow up, can consider oral NSAIDs if needed

## 2023-06-27 NOTE — Assessment & Plan Note (Signed)
Uncontrolled today with SBP slightly elevated to 144. Plan for repeat at next visit in ~2 weeks. Can change BP meds if indicated.

## 2023-06-28 ENCOUNTER — Encounter: Payer: Self-pay | Admitting: Student

## 2023-07-01 ENCOUNTER — Other Ambulatory Visit: Payer: Self-pay | Admitting: Family Medicine

## 2023-07-17 ENCOUNTER — Other Ambulatory Visit: Payer: Self-pay | Admitting: Family Medicine

## 2023-07-26 ENCOUNTER — Ambulatory Visit: Attending: Family Medicine | Admitting: Physical Therapy

## 2023-07-26 ENCOUNTER — Other Ambulatory Visit: Payer: Self-pay

## 2023-07-26 ENCOUNTER — Encounter: Payer: Self-pay | Admitting: Physical Therapy

## 2023-07-26 DIAGNOSIS — M79605 Pain in left leg: Secondary | ICD-10-CM | POA: Diagnosis not present

## 2023-07-26 DIAGNOSIS — G8929 Other chronic pain: Secondary | ICD-10-CM | POA: Diagnosis not present

## 2023-07-26 DIAGNOSIS — M6281 Muscle weakness (generalized): Secondary | ICD-10-CM | POA: Insufficient documentation

## 2023-07-26 DIAGNOSIS — M25562 Pain in left knee: Secondary | ICD-10-CM | POA: Diagnosis not present

## 2023-07-26 DIAGNOSIS — R2689 Other abnormalities of gait and mobility: Secondary | ICD-10-CM | POA: Diagnosis not present

## 2023-07-26 NOTE — Therapy (Signed)
 OUTPATIENT PHYSICAL THERAPY LOWER EXTREMITY EVALUATION   Patient Name: Kathy Duncan MRN: [id number] DOB:June 09, 1955, 68 y.o., female Today's Date: 07/26/2023  END OF SESSION:  PT End of Session - 07/26/23 1520     Visit Number 1    Number of Visits 17    Date for PT Re-Evaluation 09/20/23    Authorization Type [employer] MCR    PT Start Time 1445    PT Stop Time 1530    PT Time Calculation (min) 45 min    Activity Tolerance Patient tolerated treatment well    Behavior During Therapy [facility] for tasks assessed/performed             Past Medical History:  Diagnosis Date   Abnormal vaginal bleeding 07/29/2013   Anemia 07/12/2006   09/12/11: Anemia panel not suggestive of iron or B12 deficiency.  CBC with diff shows no pancytopenia.  Colonoscopy 2008 and referral in 2010 for heme positive stools not suggestive of need for further evaluation.  Most likely due to tamoxifen.    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   Vitreomacular adhesion of right eye 05/04/2020   Weight loss 07/09/2015   Past Surgical History:  Procedure Laterality Date   ABDOMINAL HYSTERECTOMY     has ovaries   BREAST EXCISIONAL BIOPSY Right 09/2009   TOTAL KNEE ARTHROPLASTY Right 03/01/2017   Procedure: RIGHT TOTAL KNEE ARTHROPLASTY;  Surgeon: Jene Every, MD;  Location: [facility];  Service: Orthopedics;  Laterality: Right;  120 mins with abductor block   Patient Active Problem List   Diagnosis Date Noted   Left leg pain 06/27/2023   Vulval lesion 06/12/2023   Boil of buttock 10/17/2022   Nonimmune to hepatitis B virus 06/27/2022   Type 2 diabetes mellitus with other specified complication (HCC) 06/13/2022   Posterior vitreous detachment of both eyes  02/02/2022   Pseudophakia of both eyes 02/02/2022   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) 04/29/2021   Essential hypertension 11/01/2020   Coronary artery disease involving native coronary artery of native heart without angina pectoris 06/17/2020   Aortic atherosclerosis (HCC) 06/17/2020   Retinal hemorrhage of left eye 05/04/2020   Atypical ductal hyperplasia of breast 12/15/2010   Hyperlipidemia associated with type 2 diabetes mellitus (HCC) 12/28/2006   CARPAL TUNNEL SYNDROME, BILATERAL 12/28/2006   OBESITY, NOS 07/12/2006    PCP: Doreene Eland, MD  REFERRING PROVIDER: Doreene Eland, MD  REFERRING DIAG: Left leg pain  THERAPY DIAG:  Chronic pain of left knee  Muscle weakness (generalized)  Other abnormalities of gait and mobility  Rationale for Evaluation and Treatment: Rehabilitation  ONSET DATE: Chronic > 2 years   SUBJECTIVE:  SUBJECTIVE STATEMENT: Patient reports sometimes when she walks she gets a terrible pain around the left knee and down the lower leg to the ankle. The pain has been going on for over 2 years but she didn't tell the doctor until it got so bad it affected her walking. Typically she can walk 1-15 minutes before it starts really aching but she just keeps walking. Also sometimes when she tries to relax at night she will get pain, and sometimes it will wake her up at night. She  does have trouble going up and down stairs and she has to go up 13 at her apartment so holds on the the rail. She did get a knee replacement on the right side in 2018 and it is doing good.   PERTINENT HISTORY: See PMH above  PAIN:  Are you having pain? Yes:  NPRS scale: 2/10 currently, 10/10 at worst with walking Pain location: Left knee  Pain description: Aching Aggravating factors: Walking, stairs, nocturnal pain Relieving factors: Medication, creams  PRECAUTIONS: None  RED FLAGS: None   WEIGHT BEARING RESTRICTIONS: No   FALLS:  Has  patient fallen in last 6 months? No but has almost fallen and she caught herself  LIVING ENVIRONMENT: Lives with: lives alone Lives in: House/apartment Stairs: Yes: External: 13 steps; can reach both  PLOF: Independent  PATIENT GOALS: ***   OBJECTIVE:  Note: Objective measures were completed at Evaluation unless otherwise noted. DIAGNOSTIC FINDINGS:   X-ray left knee 06/26/2023 IMPRESSION: Severe medial compartment and moderate patellofemoral compartment osteoarthritis.  PATIENT SURVEYS:  LEFS ***  COGNITION: Overall cognitive status: Within functional limits for tasks assessed     SENSATION: WFL  EDEMA:  None observable  MUSCLE LENGTH: Hamstring tightness  POSTURE: {posture:25561}  PALPATION: Tender to palpation parapatellar region, medial joint line, anterior tibia  LOWER EXTREMITY ROM:  Active ROM Right eval Left eval  Hip flexion    Hip extension    Hip abduction    Hip adduction    Hip internal rotation    Hip external rotation    Knee flexion 92 105  Knee extension 0 Lacking 5  Ankle dorsiflexion    Ankle plantarflexion    Ankle inversion    Ankle eversion     (Blank rows = not tested)  LOWER EXTREMITY MMT:  MMT Right eval Left eval  Hip flexion 4 4  Hip extension    Hip abduction 4- 3  Hip adduction    Hip internal rotation    Hip external rotation    Knee flexion    Knee extension 5 4  Ankle dorsiflexion 5 4  Ankle plantarflexion    Ankle inversion    Ankle eversion     (Blank rows = not tested)  FUNCTIONAL TESTS:  5 times sit to stand: 17 seconds 6 minute walk test: 1,110 ft  GAIT: Distance walked: 1,110 ft Assistive device utilized: None Level of assistance: Complete Independence Comments: Antalgic gait on left, compensated trendelenburg                                                                                                                                TREATMENT  [facility] Adult PT Treatment:  DATE: 07/26/2023 Side clamshell with yellow 3 x 10 SLR 3 x 10 Sit to stand 2 x 10 Seated hamstring stretch 2 x 30 sec  PATIENT EDUCATION:  Education details: Exam findings, POC, HEP Person educated: Patient Education method: Explanation, Demonstration, Tactile cues, Verbal cues, and Handouts Education comprehension: verbalized understanding, returned demonstration, verbal cues required, tactile cues required, and needs further education  HOME EXERCISE PROGRAM: Access Code: [email or web address]   ASSESSMENT: CLINICAL IMPRESSION: Patient is a 68 y.o. female who was seen today for physical therapy evaluation and treatment for chronic left knee and lower leg pain.   OBJECTIVE IMPAIRMENTS: {opptimpairments:25111}.   ACTIVITY LIMITATIONS: {activitylimitations:27494}  PARTICIPATION LIMITATIONS: {participationrestrictions:25113}  PERSONAL FACTORS: {Personal factors:25162} are also affecting patient's functional outcome.   REHAB POTENTIAL: Good  CLINICAL DECISION MAKING: Stable/uncomplicated  EVALUATION COMPLEXITY: Low   GOALS: Goals reviewed with patient? Yes  SHORT TERM GOALS: Target date: 08/23/2023  Patient will be I with initial HEP in order to progress with therapy. Baseline: Goal status: INITIAL  2.  *** Baseline:  Goal status: INITIAL  3.  *** Baseline:  Goal status: INITIAL  4.  *** Baseline:  Goal status: INITIAL  5.  *** Baseline:  Goal status: INITIAL  6.  *** Baseline:  Goal status: INITIAL  LONG TERM GOALS: Target date: ***  *** Baseline:  Goal status: INITIAL  2.  *** Baseline:  Goal status: INITIAL  3.  *** Baseline:  Goal status: INITIAL  4.  *** Baseline:  Goal status: INITIAL  5.  *** Baseline:  Goal status: INITIAL  6.  *** Baseline:  Goal status: INITIAL   PLAN:  PT FREQUENCY: {rehab frequency:25116}  PT DURATION: {rehab duration:25117}  PLANNED INTERVENTIONS: {rehab planned  interventions:25118::"97110-Therapeutic exercises","97530- Therapeutic [phone]- Neuromuscular re-education","97535- Self XLKG","40102- Manual therapy"}  PLAN FOR NEXT SESSION: ***   Hilbert Bible, PT 07/26/2023, 3:31 PM

## 2023-07-26 NOTE — Patient Instructions (Signed)
 Access Code: [email or web address] URL: [email or web address] Date: 07/26/2023 Prepared by: Rosana Hoes  Exercises - Clam with Resistance  - 1 x daily - 3 sets - 10 reps - Active Straight Leg Raise with Quad Set  - 1 x daily - 3 sets - 10 reps - Sit to Stand  - 1 x daily - 3 sets - 10 reps - Seated Hamstring Stretch  - 1 x daily - 3 reps - 30 seconds hold

## 2023-07-31 ENCOUNTER — Ambulatory Visit

## 2023-07-31 DIAGNOSIS — M6281 Muscle weakness (generalized): Secondary | ICD-10-CM | POA: Diagnosis not present

## 2023-07-31 DIAGNOSIS — M25562 Pain in left knee: Secondary | ICD-10-CM | POA: Diagnosis not present

## 2023-07-31 DIAGNOSIS — R2689 Other abnormalities of gait and mobility: Secondary | ICD-10-CM

## 2023-07-31 DIAGNOSIS — M79605 Pain in left leg: Secondary | ICD-10-CM | POA: Diagnosis not present

## 2023-07-31 DIAGNOSIS — G8929 Other chronic pain: Secondary | ICD-10-CM | POA: Diagnosis not present

## 2023-07-31 NOTE — Therapy (Signed)
 OUTPATIENT PHYSICAL THERAPY TREATMENT   Patient Name: Kathy Duncan MRN: [id number] DOB:1955-08-12, 68 y.o., female Today's Date: 07/31/2023   END OF SESSION:  PT End of Session - 07/31/23 1529     Visit Number 2    Number of Visits 17    Date for PT Re-Evaluation 09/20/23    Authorization Type [employer] M[employer]    PT Start Time 1530    PT Stop Time 1610    PT Time Calculation (min) 40 min    Activity Tolerance Patient tolerated treatment well    Behavior During Therapy [facility] for tasks assessed/performed              Past Medical History:  Diagnosis Date   Abnormal vaginal bleeding 07/29/2013   Anemia 07/12/2006   09/12/11: Anemia panel not suggestive of iron or B12 deficiency.  CBC with diff shows no pancytopenia.  Colonoscopy 2008 and referral in 2010 for heme positive stools not suggestive of need for further evaluation.  Most likely due to tamoxifen.    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   Vitreomacular adhesion of right eye 05/04/2020   Weight loss 07/09/2015   Past Surgical History:  Procedure Laterality Date   ABDOMINAL HYSTERECTOMY     has ovaries   BREAST EXCISIONAL BIOPSY Right 09/2009   TOTAL KNEE ARTHROPLASTY Right 03/01/2017   Procedure: RIGHT TOTAL KNEE ARTHROPLASTY;  Surgeon: Jene Every, MD;  Location: [facility];  Service: Orthopedics;  Laterality: Right;  120 mins with abductor block   Patient Active Problem List   Diagnosis Date Noted   Left leg pain 06/27/2023   Vulval lesion 06/12/2023   Boil of buttock 10/17/2022   Nonimmune to hepatitis B virus 06/27/2022   Type 2 diabetes mellitus with other specified complication (HCC) 06/13/2022   Posterior vitreous detachment of both eyes 02/02/2022    Pseudophakia of both eyes 02/02/2022   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) 04/29/2021   Essential hypertension 11/01/2020   Coronary artery disease involving native coronary artery of native heart without angina pectoris 06/17/2020   Aortic atherosclerosis (HCC) 06/17/2020   Retinal hemorrhage of left eye 05/04/2020   Atypical ductal hyperplasia of breast 12/15/2010   Hyperlipidemia associated with type 2 diabetes mellitus (HCC) 12/28/2006   CARPAL TUNNEL SYNDROME, BILATERAL 12/28/2006   OBESITY, NOS 07/12/2006    PCP: Doreene Eland, MD  REFERRING PROVIDER: Doreene Eland, MD  REFERRING DIAG: Left leg pain  THERAPY DIAG:  Chronic pain of left knee  Muscle weakness (generalized)  Other abnormalities of gait and mobility  Rationale for Evaluation and Treatment: Rehabilitation  ONSET DATE: Chronic > 2 years   SUBJECTIVE:  SUBJECTIVE STATEMENT: Pt presents to PT with reports of continued L knee pain. Has been compliant with initial HEP with no adverse effect.   EVAL: Patient reports sometimes when she walks she gets a terrible pain around the left knee and down the lower leg to the ankle. The pain has been going on for over 2 years but she didn't tell the doctor until it got so bad it affected her walking. Typically she can walk 1-15 minutes before it starts really aching but she just keeps  walking. Also sometimes when she tries to relax at night she will get pain, and sometimes it will wake her up at night. She does have trouble going up and down stairs and she has to go up 13 at her apartment so holds on the the rail. She did get a knee replacement on the right side in 2018 and it is doing good.   PERTINENT HISTORY: See PMH above  PAIN:  Are you having pain? Yes:  NPRS scale: 2/10 currently, 10/10 at worst with walking Pain location: Left knee  Pain description: Aching Aggravating factors: Walking, stairs, nocturnal pain Relieving factors:  Medication, creams  PRECAUTIONS: None  RED FLAGS: None   WEIGHT BEARING RESTRICTIONS: No   FALLS:  Has patient fallen in last 6 months? No but has almost fallen and she caught herself  LIVING ENVIRONMENT: Lives with: lives alone Lives in: House/apartment Stairs: Yes: External: 13 steps; can reach both  PLOF: Independent  PATIENT GOALS: Pain relief with walking   OBJECTIVE:  Note: Objective measures were completed at Evaluation unless otherwise noted. DIAGNOSTIC FINDINGS:   X-ray left knee 06/26/2023 IMPRESSION: Severe medial compartment and moderate patellofemoral compartment osteoarthritis.  PATIENT SURVEYS:  LEFS 70/80  COGNITION: Overall cognitive status: Within functional limits for tasks assessed     SENSATION: WFL  EDEMA:  None observable  MUSCLE LENGTH: Hamstring tightness  POSTURE:   Bilateral knee varus  PALPATION: Tender to palpation parapatellar region, medial joint line, anterior tibia  LOWER EXTREMITY ROM:  Active ROM Right eval Left eval  Hip flexion    Hip extension    Hip abduction    Hip adduction    Hip internal rotation    Hip external rotation    Knee flexion 92 105  Knee extension 0 Lacking 5  Ankle dorsiflexion    Ankle plantarflexion    Ankle inversion    Ankle eversion     (Blank rows = not tested)  LOWER EXTREMITY MMT:  MMT Right eval Left eval  Hip flexion 4 4  Hip extension    Hip abduction 4- 3  Hip adduction    Hip internal rotation    Hip external rotation    Knee flexion    Knee extension 5 4  Ankle dorsiflexion 5 4  Ankle plantarflexion    Ankle inversion    Ankle eversion     (Blank rows = not tested)  FUNCTIONAL TESTS:  5 times sit to stand: 17 seconds 6 minute walk test: 1,110 ft  GAIT: Distance walked: 1,110 ft Assistive device utilized: None Level of assistance: Complete Independence Comments: Antalgic gait on left, compensated trendelenburg                                                                                                                                 TREATMENT  [facility] Adult PT Treatment:  DATE: 07/31/2023 Therapeutic Exercise:  NuStep lvl 5 UE/LE x 4 min while taking subjective Supine QS x 10 - 5"  Supine SLR 2x10 Supine clamshell 3x15 GTB Seated hamstring stretch 2x30 sec LAQ 2x10 2# TKE with ball x 10 L  Therapeutic Activity: Sit to stand 2x10 - high table no UE Step ups 6in fwd 2x10 L leading Standing mini squat 2x10 - UE support  [facility] Adult PT Treatment:                                                DATE: 07/26/2023 Side clamshell with yellow 3 x 10 SLR 3 x 10 Sit to stand 2 x 10 Seated hamstring stretch 2 x 30 sec  PATIENT EDUCATION:  Education details: Exam findings, POC, HEP Person educated: Patient Education method: Explanation, Demonstration, Tactile cues, Verbal cues, and Handouts Education comprehension: verbalized understanding, returned demonstration, verbal cues required, tactile cues required, and needs further education  HOME EXERCISE PROGRAM: Access Code: [email or web address]   ASSESSMENT: CLINICAL IMPRESSION: Pt was able to complete prescribed exercises with no adverse effect. Exercises today focused on LE strengthening and improving functional activity tolerance. Pt is progressing with therapy thus far, will continue per POC as prescribed.  EVAL: Patient is a 68 y.o. female who was seen today for physical therapy evaluation and treatment for chronic left knee and lower leg pain. She demonstrates limitations in her knee mobility, strength, and decreased tolerance for activities such as walking, stairs, and transfers that is impacting her functional ability.   OBJECTIVE IMPAIRMENTS: Abnormal gait, decreased activity tolerance, decreased balance, decreased ROM, decreased strength, impaired flexibility, postural dysfunction, and pain.   ACTIVITY LIMITATIONS: standing, squatting, sleeping,  stairs, transfers, and locomotion level  PARTICIPATION LIMITATIONS: meal prep, cleaning, shopping, and community activity  PERSONAL FACTORS: Fitness, Past/current experiences, and Time since onset of injury/illness/exacerbation are also affecting patient's functional outcome.   REHAB POTENTIAL: Good  CLINICAL DECISION MAKING: Stable/uncomplicated  EVALUATION COMPLEXITY: Low   GOALS: Goals reviewed with patient? Yes  SHORT TERM GOALS: Target date: 08/23/2023  Patient will be I with initial HEP in order to progress with therapy. Baseline: HEP provided at eval Goal status: INITIAL  2.  Patient will report left knee pain with activity </= 6/10 in order to reduce functional limitation Baseline: 10/10 at worst Goal status: INITIAL  3.  Patient will demonstrate left knee extension to neutral to improve gait Baseline: lacking 5 deg Goal status: INITIAL  LONG TERM GOALS: Target date: 09/20/2023  Patient will be I with final HEP to maintain progress from PT. Baseline: HEP provided at eval Goal status: INITIAL  2.  Patient will report LEFS >/= 75/80 in order to indicate improvement in functional status Baseline: 70/80 Goal status: INITIAL  3.  Patient will demonstrate left knee strength 5/5 MMT in order to improve walking tolerance Baseline: 4/5 MMT Goal status: INITIAL  4.  Patient will perform 5xSTS </= 12 seconds to indicate improvement in LE strength and reduce fall risk Baseline: 17 seconds Goal status: INITIAL  5.  Patient will perform >/= 1500 ft in order to improve community access Baseline: 1,110 ft Goal status: INITIAL  6.  Patient will report left knee pain </= 3/10 with extended periods of walking to reduce functional limitations Baseline: 10/10 pain Goal status: INITIAL   PLAN: PT FREQUENCY: 1-2x/week  PT DURATION:  8 weeks  PLANNED INTERVENTIONS: 97164- PT Re-evaluation, 97110-Therapeutic exercises, 97530- Therapeutic activity, 97112- Neuromuscular  re-education, [phone]- Self Care, 60454- Manual therapy, [phone]- Gait training, [phone]- Aquatic Therapy, Patient/Family education, Balance training, Stair training, Taping, Dry Needling, Joint mobilization, Joint manipulation, Cryotherapy, and Moist heat  PLAN FOR NEXT SESSION: Review HEP and progress PRN, manual/stretching to improve left knee extension, progress left knee and hip strengthening, balance training   Eloy End PT  07/31/23 4:14 PM   Referring diagnosis? M79.605  Treatment diagnosis? (if different than referring diagnosis) M25.562 What was this (referring dx) caused by? []  Surgery []  Fall [x]  Ongoing issue [x]  Arthritis []  Other: ____________  Laterality: []  Rt [x]  Lt []  Both  Check all possible CPT codes:  *CHOOSE 10 OR LESS*    See Planned Interventions listed in the Plan section of the Evaluation.

## 2023-08-02 ENCOUNTER — Ambulatory Visit

## 2023-08-02 DIAGNOSIS — M6281 Muscle weakness (generalized): Secondary | ICD-10-CM

## 2023-08-02 DIAGNOSIS — G8929 Other chronic pain: Secondary | ICD-10-CM

## 2023-08-02 DIAGNOSIS — M79605 Pain in left leg: Secondary | ICD-10-CM | POA: Diagnosis not present

## 2023-08-02 DIAGNOSIS — M25562 Pain in left knee: Secondary | ICD-10-CM | POA: Diagnosis not present

## 2023-08-02 DIAGNOSIS — R2689 Other abnormalities of gait and mobility: Secondary | ICD-10-CM

## 2023-08-02 NOTE — Therapy (Signed)
 OUTPATIENT PHYSICAL THERAPY TREATMENT   Patient Name: Kathy Duncan MRN: [id number] DOB:Oct 07, 1955, 69 y.o., female Today's Date: 08/02/2023   END OF SESSION:  PT End of Session - 08/02/23 1450     Visit Number 3    Number of Visits 17    Date for PT Re-Evaluation 09/20/23    Authorization Type [employer] MCR    PT Start Time 1450    PT Stop Time 1530    PT Time Calculation (min) 40 min    Activity Tolerance Patient tolerated treatment well    Behavior During Therapy [facility] for tasks assessed/performed               Past Medical History:  Diagnosis Date   Abnormal vaginal bleeding 07/29/2013   Anemia 07/12/2006   09/12/11: Anemia panel not suggestive of iron or B12 deficiency.  CBC with diff shows no pancytopenia.  Colonoscopy 2008 and referral in 2010 for heme positive stools not suggestive of need for further evaluation.  Most likely due to tamoxifen.    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   Vitreomacular adhesion of right eye 05/04/2020   Weight loss 07/09/2015   Past Surgical History:  Procedure Laterality Date   ABDOMINAL HYSTERECTOMY     has ovaries   BREAST EXCISIONAL BIOPSY Right 09/2009   TOTAL KNEE ARTHROPLASTY Right 03/01/2017   Procedure: RIGHT TOTAL KNEE ARTHROPLASTY;  Surgeon: Jene Every, MD;  Location: [facility];  Service: Orthopedics;  Laterality: Right;  120 mins with abductor block   Patient Active Problem List   Diagnosis Date Noted   Left leg pain 06/27/2023   Vulval lesion 06/12/2023   Boil of buttock 10/17/2022   Nonimmune to hepatitis B virus 06/27/2022   Type 2 diabetes mellitus with other specified complication (HCC) 06/13/2022   Posterior vitreous detachment of both eyes 02/02/2022    Pseudophakia of both eyes 02/02/2022   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) 04/29/2021   Essential hypertension 11/01/2020   Coronary artery disease involving native coronary artery of native heart without angina pectoris 06/17/2020   Aortic atherosclerosis (HCC) 06/17/2020   Retinal hemorrhage of left eye 05/04/2020   Atypical ductal hyperplasia of breast 12/15/2010   Hyperlipidemia associated with type 2 diabetes mellitus (HCC) 12/28/2006   CARPAL TUNNEL SYNDROME, BILATERAL 12/28/2006   OBESITY, NOS 07/12/2006    PCP: Doreene Eland, MD  REFERRING PROVIDER: Doreene Eland, MD  REFERRING DIAG: Left leg pain  THERAPY DIAG:  Chronic pain of left knee  Muscle weakness (generalized)  Other abnormalities of gait and mobility  Rationale for Evaluation and Treatment: Rehabilitation  ONSET DATE: Chronic > 2 years   SUBJECTIVE:  SUBJECTIVE STATEMENT: Pt presents to PT with reports of continued L knee pain. Has been compliant with HEP with no adverse effect.   EVAL: Patient reports sometimes when she walks she gets a terrible pain around the left knee and down the lower leg to the ankle. The pain has been going on for over 2 years but she didn't tell the doctor until it got so bad it affected her walking. Typically she can walk 1-15 minutes before it starts really aching but she just keeps  walking. Also sometimes when she tries to relax at night she will get pain, and sometimes it will wake her up at night. She does have trouble going up and down stairs and she has to go up 13 at her apartment so holds on the the rail. She did get a knee replacement on the right side in 2018 and it is doing good.   PERTINENT HISTORY: See PMH above  PAIN:  Are you having pain? Yes:  NPRS scale: 2/10 currently, 10/10 at worst with walking Pain location: Left knee  Pain description: Aching Aggravating factors: Walking, stairs, nocturnal pain Relieving factors:  Medication, creams  PRECAUTIONS: None  RED FLAGS: None   WEIGHT BEARING RESTRICTIONS: No   FALLS:  Has patient fallen in last 6 months? No but has almost fallen and she caught herself  LIVING ENVIRONMENT: Lives with: lives alone Lives in: House/apartment Stairs: Yes: External: 13 steps; can reach both  PLOF: Independent  PATIENT GOALS: Pain relief with walking   OBJECTIVE:  Note: Objective measures were completed at Evaluation unless otherwise noted. DIAGNOSTIC FINDINGS:   X-ray left knee 06/26/2023 IMPRESSION: Severe medial compartment and moderate patellofemoral compartment osteoarthritis.  PATIENT SURVEYS:  LEFS 70/80  COGNITION: Overall cognitive status: Within functional limits for tasks assessed     SENSATION: WFL  EDEMA:  None observable  MUSCLE LENGTH: Hamstring tightness  POSTURE:   Bilateral knee varus  PALPATION: Tender to palpation parapatellar region, medial joint line, anterior tibia  LOWER EXTREMITY ROM:  Active ROM Right eval Left eval  Hip flexion    Hip extension    Hip abduction    Hip adduction    Hip internal rotation    Hip external rotation    Knee flexion 92 105  Knee extension 0 Lacking 5  Ankle dorsiflexion    Ankle plantarflexion    Ankle inversion    Ankle eversion     (Blank rows = not tested)  LOWER EXTREMITY MMT:  MMT Right eval Left eval  Hip flexion 4 4  Hip extension    Hip abduction 4- 3  Hip adduction    Hip internal rotation    Hip external rotation    Knee flexion    Knee extension 5 4  Ankle dorsiflexion 5 4  Ankle plantarflexion    Ankle inversion    Ankle eversion     (Blank rows = not tested)  FUNCTIONAL TESTS:  5 times sit to stand: 17 seconds 6 minute walk test: 1,110 ft  GAIT: Distance walked: 1,110 ft Assistive device utilized: None Level of assistance: Complete Independence Comments: Antalgic gait on left, compensated trendelenburg                                                                                                                                 TREATMENT  [facility] Adult PT Treatment:  DATE: 08/02/2023 Therapeutic Exercise:  NuStep lvl 5 UE/LE x 4 min while taking subjective Supine QS x 10 - 5" L with towel Supine SLR 2x10 Supine clamshell 3x15 GTB Bridge 2x10 Seated hamstring stretch 2x30 sec each LAQ 2x10 2.5# TKE with ball 2x10 each Single leg press 2x10 15# L Therapeutic Activity: Sit to stand 2x10 - high table no UE Step ups 8in fwd 2x10 L leading Standing mini squat 2x10 - UE support  [facility] Adult PT Treatment:                                                DATE: 07/31/2023 Therapeutic Exercise:  NuStep lvl 5 UE/LE x 4 min while taking subjective Supine QS x 10 - 5"  Supine SLR 2x10 Supine clamshell 3x15 GTB Seated hamstring stretch 2x30 sec LAQ 2x10 2# TKE with ball x 10 L  Therapeutic Activity: Sit to stand 2x10 - high table no UE Step ups 6in fwd 2x10 L leading Standing mini squat 2x10 - UE support  [facility] Adult PT Treatment:                                                DATE: 07/26/2023 Side clamshell with yellow 3 x 10 SLR 3 x 10 Sit to stand 2 x 10 Seated hamstring stretch 2 x 30 sec  PATIENT EDUCATION:  Education details: Exam findings, POC, HEP Person educated: Patient Education method: Explanation, Demonstration, Tactile cues, Verbal cues, and Handouts Education comprehension: verbalized understanding, returned demonstration, verbal cues required, tactile cues required, and needs further education  HOME EXERCISE PROGRAM: Access Code: [email or web address]   ASSESSMENT: CLINICAL IMPRESSION: Pt was able to complete all prescribed exercises with no adverse effect. Exercises today focused once again on improving bilateral quad and proximal hip strength with particular emphasis on L quad as well as improving functional movement with squat and repeated step ups. Pt is progressing well with  therapy thus far, no changes to HEP made today. Will continue to progress as able per POC.   EVAL: Patient is a 68 y.o. female who was seen today for physical therapy evaluation and treatment for chronic left knee and lower leg pain. She demonstrates limitations in her knee mobility, strength, and decreased tolerance for activities such as walking, stairs, and transfers that is impacting her functional ability.   OBJECTIVE IMPAIRMENTS: Abnormal gait, decreased activity tolerance, decreased balance, decreased ROM, decreased strength, impaired flexibility, postural dysfunction, and pain.   ACTIVITY LIMITATIONS: standing, squatting, sleeping, stairs, transfers, and locomotion level  PARTICIPATION LIMITATIONS: meal prep, cleaning, shopping, and community activity  PERSONAL FACTORS: Fitness, Past/current experiences, and Time since onset of injury/illness/exacerbation are also affecting patient's functional outcome.   REHAB POTENTIAL: Good  CLINICAL DECISION MAKING: Stable/uncomplicated  EVALUATION COMPLEXITY: Low   GOALS: Goals reviewed with patient? Yes  SHORT TERM GOALS: Target date: 08/23/2023  Patient will be I with initial HEP in order to progress with therapy. Baseline: HEP provided at eval Goal status: INITIAL  2.  Patient will report left knee pain with activity </= 6/10 in order to reduce functional limitation Baseline: 10/10 at worst Goal status: INITIAL  3.  Patient will demonstrate left knee extension to neutral to improve gait Baseline: lacking  5 deg Goal status: INITIAL  LONG TERM GOALS: Target date: 09/20/2023  Patient will be I with final HEP to maintain progress from PT. Baseline: HEP provided at eval Goal status: INITIAL  2.  Patient will report LEFS >/= 75/80 in order to indicate improvement in functional status Baseline: 70/80 Goal status: INITIAL  3.  Patient will demonstrate left knee strength 5/5 MMT in order to improve walking tolerance Baseline: 4/5  MMT Goal status: INITIAL  4.  Patient will perform 5xSTS </= 12 seconds to indicate improvement in LE strength and reduce fall risk Baseline: 17 seconds Goal status: INITIAL  5.  Patient will perform >/= 1500 ft in order to improve community access Baseline: 1,110 ft Goal status: INITIAL  6.  Patient will report left knee pain </= 3/10 with extended periods of walking to reduce functional limitations Baseline: 10/10 pain Goal status: INITIAL   PLAN: PT FREQUENCY: 1-2x/week  PT DURATION: 8 weeks  PLANNED INTERVENTIONS: 97164- PT Re-evaluation, 97110-Therapeutic exercises, 97530- Therapeutic activity, 97112- Neuromuscular re-education, 97535- Self Care, 16109- Manual therapy, [phone]- Gait training, [phone]- Aquatic Therapy, Patient/Family education, Balance training, Stair training, Taping, Dry Needling, Joint mobilization, Joint manipulation, Cryotherapy, and Moist heat  PLAN FOR NEXT SESSION: Review HEP and progress PRN, manual/stretching to improve left knee extension, progress left knee and hip strengthening, balance training   Eloy End PT  08/02/23 3:41 PM   Referring diagnosis? M79.605  Treatment diagnosis? (if different than referring diagnosis) M25.562 What was this (referring dx) caused by? []  Surgery []  Fall [x]  Ongoing issue [x]  Arthritis []  Other: ____________  Laterality: []  Rt [x]  Lt []  Both  Check all possible CPT codes:  *CHOOSE 10 OR LESS*    See Planned Interventions listed in the Plan section of the Evaluation.

## 2023-08-07 ENCOUNTER — Ambulatory Visit: Admitting: Physical Therapy

## 2023-08-07 ENCOUNTER — Other Ambulatory Visit: Payer: Self-pay

## 2023-08-07 ENCOUNTER — Encounter: Payer: Self-pay | Admitting: Physical Therapy

## 2023-08-07 DIAGNOSIS — R2689 Other abnormalities of gait and mobility: Secondary | ICD-10-CM

## 2023-08-07 DIAGNOSIS — G8929 Other chronic pain: Secondary | ICD-10-CM | POA: Diagnosis not present

## 2023-08-07 DIAGNOSIS — M25562 Pain in left knee: Secondary | ICD-10-CM | POA: Diagnosis not present

## 2023-08-07 DIAGNOSIS — M6281 Muscle weakness (generalized): Secondary | ICD-10-CM

## 2023-08-07 DIAGNOSIS — M79605 Pain in left leg: Secondary | ICD-10-CM | POA: Diagnosis not present

## 2023-08-07 NOTE — Patient Instructions (Signed)
 Access Code: [email or web address] URL: [email or web address] Date: 08/07/2023 Prepared by: Rosana Hoes  Exercises - Clam with Resistance  - 1 x daily - 3 sets - 10 reps - Active Straight Leg Raise with Quad Set  - 1 x daily - 3 sets - 10 reps - Sit to Stand  - 1 x daily - 3 sets - 10 reps - Seated Hamstring Stretch  - 1 x daily - 3 reps - 30 seconds hold - Seated Ankle Dorsiflexion with Resistance  - 1 x daily - 3 sets - 10 reps

## 2023-08-07 NOTE — Therapy (Signed)
 OUTPATIENT PHYSICAL THERAPY TREATMENT   Patient Name: Kathy Duncan MRN: [id number] DOB:[birth year], 68 y.o., female Today's Date: 08/07/2023   END OF SESSION:  PT End of Session - 08/07/23 1404     Visit Number 4    Number of Visits 17    Date for PT Re-Evaluation 09/20/23    Authorization Type [employer] M[employer]    Authorization Time Period 07/30/2023 - 09/15/2023    Authorization - Visit Number 3    Authorization - Number of Visits 12    PT Start Time 1401    PT Stop Time 1442    PT Time Calculation (min) 41 min    Activity Tolerance Patient tolerated treatment well    Behavior During Therapy [facility] for tasks assessed/performed                Past Medical History:  Diagnosis Date   Abnormal vaginal bleeding 07/29/2013   Anemia 07/12/2006   09/12/11: Anemia panel not suggestive of iron or B12 deficiency.  CBC with diff shows no pancytopenia.  Colonoscopy 2008 and referral in 2010 for heme positive stools not suggestive of need for further evaluation.  Most likely due to tamoxifen.    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   Vitreomacular adhesion of right eye 05/04/2020   Weight loss 07/09/2015   Past Surgical History:  Procedure Laterality Date   ABDOMINAL HYSTERECTOMY     has ovaries   BREAST EXCISIONAL BIOPSY Right 09/2009   TOTAL KNEE ARTHROPLASTY Right 03/01/2017   Procedure: RIGHT TOTAL KNEE ARTHROPLASTY;  Surgeon: Jene Every, MD;  Location: [facility];  Service: Orthopedics;  Laterality: Right;  120 mins with abductor block   Patient Active Problem List   Diagnosis Date Noted   Left leg pain 06/27/2023   Vulval lesion 06/12/2023   Boil of buttock 10/17/2022   Nonimmune to hepatitis B virus 06/27/2022   Type 2  diabetes mellitus with other specified complication (HCC) 06/13/2022   Posterior vitreous detachment of both eyes 02/02/2022   Pseudophakia of both eyes 02/02/2022   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) 04/29/2021   Essential hypertension 11/01/2020   Coronary artery disease involving native coronary artery of native heart without angina pectoris 06/17/2020   Aortic atherosclerosis (HCC) 06/17/2020   Retinal hemorrhage of left eye 05/04/2020   Atypical ductal hyperplasia of breast 12/15/2010   Hyperlipidemia associated with type 2 diabetes mellitus (HCC) 12/28/2006   CARPAL TUNNEL SYNDROME, BILATERAL 12/28/2006   OBESITY, NOS 07/12/2006    PCP: Doreene Eland, MD  REFERRING PROVIDER: Doreene Eland, MD  REFERRING DIAG: Left leg pain  THERAPY DIAG:  Chronic pain of left knee  Muscle weakness (generalized)  Other abnormalities of gait and mobility  Rationale for Evaluation and Treatment: Rehabilitation  ONSET DATE: Chronic > 2 years   SUBJECTIVE:  SUBJECTIVE STATEMENT: Patient reports left knee is doing alright. She has been working on her exercises and has also been Immunologist at home.  EVAL: Patient reports sometimes when she walks she gets a terrible pain around the left knee and down the lower leg to the ankle. The pain has been going on for over 2 years but she  didn't tell the doctor until it got so bad it affected her walking. Typically she can walk 1-15 minutes before it starts really aching but she just keeps walking. Also sometimes when she tries to relax at night she will get pain, and sometimes it will wake her up at night. She does have trouble going up and down stairs and she has to go up 13 at her apartment so holds on the the rail. She did get a knee replacement on the right side in 2018 and it is doing good.   PERTINENT HISTORY: See PMH above  PAIN:  Are you having pain? Yes:  NPRS scale: 5/10 currently, 10/10 at worst with  walking Pain location: Left knee  Pain description: Aching Aggravating factors: Walking, stairs, nocturnal pain Relieving factors: Medication, creams  PRECAUTIONS: None  WEIGHT BEARING RESTRICTIONS: No   PATIENT GOALS: Pain relief with walking   OBJECTIVE:  Note: Objective measures were completed at Evaluation unless otherwise noted. PATIENT SURVEYS:  LEFS 70/80  MUSCLE LENGTH: Hamstring tightness  POSTURE:   Bilateral knee varus  PALPATION: Tender to palpation parapatellar region, medial joint line, anterior tibia  LOWER EXTREMITY ROM:  Active ROM Right eval Left eval Left 08/07/2023  Hip flexion     Hip extension     Hip abduction     Hip adduction     Hip internal rotation     Hip external rotation     Knee flexion 92 105   Knee extension 0 Lacking 5 Lacking 5  Ankle dorsiflexion     Ankle plantarflexion     Ankle inversion     Ankle eversion      (Blank rows = not tested)  LOWER EXTREMITY MMT:  MMT Right eval Left eval  Hip flexion 4 4  Hip extension    Hip abduction 4- 3  Hip adduction    Hip internal rotation    Hip external rotation    Knee flexion    Knee extension 5 4  Ankle dorsiflexion 5 4  Ankle plantarflexion    Ankle inversion    Ankle eversion     (Blank rows = not tested)  FUNCTIONAL TESTS:  5 times sit to stand: 17 seconds 6 minute walk test: 1,110 ft  GAIT: Distance walked: 1,110 ft Assistive device utilized: None Level of assistance: Complete Independence Comments: Antalgic gait on left, compensated trendelenburg                                                                                                                                TREATMENT  [facility] Adult PT Treatment:                                                DATE: 08/07/2023 NuStep L5 x 5 min with LE to  improve LE endurance and workload capacity Slant board calf stretch 3 x 20 sec Seated hamstring stretch 3 x 30 sec Hooklying and seated knee joint A/P  mobs Supine patellar mobs all directions STM left anterior tib Supine quad set with small towel roll 2 x 10 x 5 sec Seated ankle DF with green 3 x 10 LAQ with 3# 3 x 10 Sit to stand 3 x 10   PATIENT EDUCATION:  Education details: HEP update Person educated: Patient Education method: Explanation, Demonstration, Tactile cues, Verbal cues, and Handouts Education comprehension: verbalized understanding, returned demonstration, verbal cues required, tactile cues required, and needs further education  HOME EXERCISE PROGRAM: Access Code: [email or web address]   ASSESSMENT: CLINICAL IMPRESSION: Patient tolerated therapy well with no adverse effects. Therapy continues to focus on progressing her knee extension mobility and progress left knee strength. She continues to report anterior shin pain with knee extension so performed STM to the anterior tib and incorporated knee and patellar joint mobs with good tolerance. She was able to progress with her quad strengthening this visit. No change to her knee extension motion compared to evaluation. Updated HEP to progress anterior tib strengthening. Patient would benefit from continued skilled PT to progress mobility and strength in order to reduce pain and maximize functional ability.   EVAL: Patient is a 68 y.o. female who was seen today for physical therapy evaluation and treatment for chronic left knee and lower leg pain. She demonstrates limitations in her knee mobility, strength, and decreased tolerance for activities such as walking, stairs, and transfers that is impacting her functional ability.  OBJECTIVE IMPAIRMENTS: Abnormal gait, decreased activity tolerance, decreased balance, decreased ROM, decreased strength, impaired flexibility, postural dysfunction, and pain.   ACTIVITY LIMITATIONS: standing, squatting, sleeping, stairs, transfers, and locomotion level  PARTICIPATION LIMITATIONS: meal prep, cleaning, shopping, and community activity  PERSONAL  FACTORS: Fitness, Past/current experiences, and Time since onset of injury/illness/exacerbation are also affecting patient's functional outcome.    GOALS: Goals reviewed with patient? Yes  SHORT TERM GOALS: Target date: 08/23/2023  Patient will be I with initial HEP in order to progress with therapy. Baseline: HEP provided at eval Goal status: INITIAL  2.  Patient will report left knee pain with activity </= 6/10 in order to reduce functional limitation Baseline: 10/10 at worst Goal status: INITIAL  3.  Patient will demonstrate left knee extension to neutral to improve gait Baseline: lacking 5 deg Goal status: INITIAL  LONG TERM GOALS: Target date: 09/20/2023  Patient will be I with final HEP to maintain progress from PT. Baseline: HEP provided at eval Goal status: INITIAL  2.  Patient will report LEFS >/= 75/80 in order to indicate improvement in functional status Baseline: 70/80 Goal status: INITIAL  3.  Patient will demonstrate left knee strength 5/5 MMT in order to improve walking tolerance Baseline: 4/5 MMT Goal status: INITIAL  4.  Patient will perform 5xSTS </= 12 seconds to indicate improvement in LE strength and reduce fall risk Baseline: 17 seconds Goal status: INITIAL  5.  Patient will perform >/= 1500 ft in order to improve community access Baseline: 1,110 ft Goal status: INITIAL  6.  Patient will report left knee pain </= 3/10 with extended periods of walking to reduce functional limitations Baseline: 10/10 pain Goal status: INITIAL   PLAN: PT FREQUENCY: 1-2x/week  PT DURATION: 8 weeks  PLANNED INTERVENTIONS: 97164- PT Re-evaluation, 97110-Therapeutic exercises, 97530- Therapeutic activity, [id number]- Neuromuscular re-education, 97535- Self Care, 16109- Manual therapy, [id number]- Gait training, [phone]-  Aquatic Therapy, Patient/Family education, Balance training, Stair training, Taping, Dry Needling, Joint mobilization, Joint manipulation, Cryotherapy, and  Moist heat  PLAN FOR NEXT SESSION: Review HEP and progress PRN, manual/stretching to improve left knee extension, progress left knee and hip strengthening, balance training   Rosana Hoes, PT, DPT, LAT, ATC 08/07/23  2:45 PM Phone: [phone] Fax: [phone]

## 2023-08-09 ENCOUNTER — Ambulatory Visit

## 2023-08-09 DIAGNOSIS — R2689 Other abnormalities of gait and mobility: Secondary | ICD-10-CM

## 2023-08-09 DIAGNOSIS — M79605 Pain in left leg: Secondary | ICD-10-CM | POA: Diagnosis not present

## 2023-08-09 DIAGNOSIS — G8929 Other chronic pain: Secondary | ICD-10-CM | POA: Diagnosis not present

## 2023-08-09 DIAGNOSIS — M25562 Pain in left knee: Secondary | ICD-10-CM | POA: Diagnosis not present

## 2023-08-09 DIAGNOSIS — M6281 Muscle weakness (generalized): Secondary | ICD-10-CM

## 2023-08-09 NOTE — Therapy (Signed)
 OUTPATIENT PHYSICAL THERAPY TREATMENT   Patient Name: Kathy Duncan MRN: [id number] DOB:05-05-1956, 68 y.o., female Today's Date: 08/09/2023   END OF SESSION:  PT End of Session - 08/09/23 1530     Visit Number 5    Number of Visits 17    Date for PT Re-Evaluation 09/20/23    Authorization Type [employer] MCR    Authorization Time Period 07/30/2023 - 09/15/2023    Authorization - Visit Number 4    Authorization - Number of Visits 12    PT Start Time 1530    PT Stop Time 1608    PT Time Calculation (min) 38 min    Activity Tolerance Patient tolerated treatment well    Behavior During Therapy [facility] for tasks assessed/performed                 Past Medical History:  Diagnosis Date   Abnormal vaginal bleeding 07/29/2013   Anemia 07/12/2006   09/12/11: Anemia panel not suggestive of iron or B12 deficiency.  CBC with diff shows no pancytopenia.  Colonoscopy 2008 and referral in 2010 for heme positive stools not suggestive of need for further evaluation.  Most likely due to tamoxifen.    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   Vitreomacular adhesion of right eye 05/04/2020   Weight loss 07/09/2015   Past Surgical History:  Procedure Laterality Date   ABDOMINAL HYSTERECTOMY     has ovaries   BREAST EXCISIONAL BIOPSY Right 09/2009   TOTAL KNEE ARTHROPLASTY Right 03/01/2017   Procedure: RIGHT TOTAL KNEE ARTHROPLASTY;  Surgeon: Jene Every, MD;  Location: [facility];  Service: Orthopedics;  Laterality: Right;  120 mins with abductor block   Patient Active Problem List   Diagnosis Date Noted   Left leg pain 06/27/2023   Vulval lesion 06/12/2023   Boil of buttock 10/17/2022   Nonimmune to hepatitis B virus 06/27/2022   Type  2 diabetes mellitus with other specified complication (HCC) 06/13/2022   Posterior vitreous detachment of both eyes 02/02/2022   Pseudophakia of both eyes 02/02/2022   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) 04/29/2021   Essential hypertension 11/01/2020   Coronary artery disease involving native coronary artery of native heart without angina pectoris 06/17/2020   Aortic atherosclerosis (HCC) 06/17/2020   Retinal hemorrhage of left eye 05/04/2020   Atypical ductal hyperplasia of breast 12/15/2010   Hyperlipidemia associated with type 2 diabetes mellitus (HCC) 12/28/2006   CARPAL TUNNEL SYNDROME, BILATERAL 12/28/2006   OBESITY, NOS 07/12/2006    PCP: Doreene Eland, MD  REFERRING PROVIDER: Doreene Eland, MD  REFERRING DIAG: Left leg pain  THERAPY DIAG:  Chronic pain of left knee  Muscle weakness (generalized)  Other abnormalities of gait and mobility  Rationale for Evaluation and Treatment: Rehabilitation  ONSET DATE: Chronic > 2 years   SUBJECTIVE:  SUBJECTIVE STATEMENT: Pt presents to PT with reports of continued L knee pain, although it is starting to get better. Has been compliant with HEP with no adverse effect.   EVAL: Patient reports sometimes when she walks she gets a terrible pain around the left knee and down the lower leg to the ankle. The pain has been going  on for over 2 years but she didn't tell the doctor until it got so bad it affected her walking. Typically she can walk 1-15 minutes before it starts really aching but she just keeps walking. Also sometimes when she tries to relax at night she will get pain, and sometimes it will wake her up at night. She does have trouble going up and down stairs and she has to go up 13 at her apartment so holds on the the rail. She did get a knee replacement on the right side in 2018 and it is doing good.   PERTINENT HISTORY: See PMH above  PAIN:  Are you having pain? Yes:  NPRS scale: 5/10  currently, 10/10 at worst with walking Pain location: Left knee  Pain description: Aching Aggravating factors: Walking, stairs, nocturnal pain Relieving factors: Medication, creams  PRECAUTIONS: None  WEIGHT BEARING RESTRICTIONS: No   PATIENT GOALS: Pain relief with walking   OBJECTIVE:  Note: Objective measures were completed at Evaluation unless otherwise noted. PATIENT SURVEYS:  LEFS 70/80  MUSCLE LENGTH: Hamstring tightness  POSTURE:   Bilateral knee varus  PALPATION: Tender to palpation parapatellar region, medial joint line, anterior tibia  LOWER EXTREMITY ROM:  Active ROM Right eval Left eval Left 08/07/2023 Left 08/09/2023  Hip flexion      Hip extension      Hip abduction      Hip adduction      Hip internal rotation      Hip external rotation      Knee flexion 92 105    Knee extension 0 Lacking 5 Lacking 5 Lacking 4  Ankle dorsiflexion      Ankle plantarflexion      Ankle inversion      Ankle eversion       (Blank rows = not tested)  LOWER EXTREMITY MMT:  MMT Right eval Left eval  Hip flexion 4 4  Hip extension    Hip abduction 4- 3  Hip adduction    Hip internal rotation    Hip external rotation    Knee flexion    Knee extension 5 4  Ankle dorsiflexion 5 4  Ankle plantarflexion    Ankle inversion    Ankle eversion     (Blank rows = not tested)  FUNCTIONAL TESTS:  5 times sit to stand: 17 seconds 6 minute walk test: 1,110 ft  GAIT: Distance walked: 1,110 ft Assistive device utilized: None Level of assistance: Complete Independence Comments: Antalgic gait on left, compensated trendelenburg                                                                                                                                TREATMENT  [facility] Adult PT Treatment:  DATE: 08/09/2023 Slant board calf stretch 2x30 sec STS 2x10 5# - high table Supine SLR 2x10 each Supine QS with block L x 10 - 5"  hold Hooklying clamshell 2x15 blue band LAQ 3x10 4# Standing TKE with ball 2x10 L  Lateral walk YTB x 3 laps at counter Standing hip ext 2x10 YTB Therapeutic Activity: Step up 6in L lead 2x10 fwd - 1 UE STS 2x10 5# - high table NuStep L5 x 5 min with LE to improve LE endurance and workload capacity Functional squat 2x10 - B UE  [facility] Adult PT Treatment:                                                DATE: 08/07/2023 NuStep L5 x 5 min with LE to improve LE endurance and workload capacity Slant board calf stretch 3 x 20 sec Seated hamstring stretch 3 x 30 sec Hooklying and seated knee joint A/P mobs Supine patellar mobs all directions STM left anterior tib Supine quad set with small towel roll 2 x 10 x 5 sec Seated ankle DF with green 3 x 10 LAQ with 3# 3 x 10 Sit to stand 3 x 10   PATIENT EDUCATION:  Education details: HEP update Person educated: Patient Education method: Explanation, Demonstration, Tactile cues, Verbal cues, and Handouts Education comprehension: verbalized understanding, returned demonstration, verbal cues required, tactile cues required, and needs further education  HOME EXERCISE PROGRAM: Access Code: [email or web address]   ASSESSMENT: CLINICAL IMPRESSION: Pt was able to complete all prescribed exercises with no adverse effect. Exercises today focused once again on improving bilateral quad and proximal hip strength with particular emphasis on L quad as well as improving functional movement with squat and repeated step ups. Pt is progressing well with therapy thus far, no changes to HEP made today. Will continue to progress as able per POC.   EVAL: Patient is a 68 y.o. female who was seen today for physical therapy evaluation and treatment for chronic left knee and lower leg pain. She demonstrates limitations in her knee mobility, strength, and decreased tolerance for activities such as walking, stairs, and transfers that is impacting her functional ability.  OBJECTIVE  IMPAIRMENTS: Abnormal gait, decreased activity tolerance, decreased balance, decreased ROM, decreased strength, impaired flexibility, postural dysfunction, and pain.   ACTIVITY LIMITATIONS: standing, squatting, sleeping, stairs, transfers, and locomotion level  PARTICIPATION LIMITATIONS: meal prep, cleaning, shopping, and community activity  PERSONAL FACTORS: Fitness, Past/current experiences, and Time since onset of injury/illness/exacerbation are also affecting patient's functional outcome.    GOALS: Goals reviewed with patient? Yes  SHORT TERM GOALS: Target date: 08/23/2023  Patient will be I with initial HEP in order to progress with therapy. Baseline: HEP provided at eval Goal status: INITIAL  2.  Patient will report left knee pain with activity </= 6/10 in order to reduce functional limitation Baseline: 10/10 at worst Goal status: INITIAL  3.  Patient will demonstrate left knee extension to neutral to improve gait Baseline: lacking 5 deg Goal status: INITIAL  LONG TERM GOALS: Target date: 09/20/2023  Patient will be I with final HEP to maintain progress from PT. Baseline: HEP provided at eval Goal status: INITIAL  2.  Patient will report LEFS >/= 75/80 in order to indicate improvement in functional status Baseline: 70/80 Goal status: INITIAL  3.  Patient will demonstrate left knee strength 5/5  MMT in order to improve walking tolerance Baseline: 4/5 MMT Goal status: INITIAL  4.  Patient will perform 5xSTS </= 12 seconds to indicate improvement in LE strength and reduce fall risk Baseline: 17 seconds Goal status: INITIAL  5.  Patient will perform >/= 1500 ft in order to improve community access Baseline: 1,110 ft Goal status: INITIAL  6.  Patient will report left knee pain </= 3/10 with extended periods of walking to reduce functional limitations Baseline: 10/10 pain Goal status: INITIAL   PLAN: PT FREQUENCY: 1-2x/week  PT DURATION: 8 weeks  PLANNED  INTERVENTIONS: 97164- PT Re-evaluation, 97110-Therapeutic exercises, 97530- Therapeutic activity, 97112- Neuromuscular re-education, 97535- Self Care, 19147- Manual therapy, [phone]- Gait training, [phone]- Aquatic Therapy, Patient/Family education, Balance training, Stair training, Taping, Dry Needling, Joint mobilization, Joint manipulation, Cryotherapy, and Moist heat  PLAN FOR NEXT SESSION: Review HEP and progress PRN, manual/stretching to improve left knee extension, progress left knee and hip strengthening, balance training   Eloy End PT  08/09/23 4:08 PM

## 2023-08-14 ENCOUNTER — Encounter: Payer: Self-pay | Admitting: Physical Therapy

## 2023-08-14 ENCOUNTER — Ambulatory Visit: Attending: Family Medicine | Admitting: Physical Therapy

## 2023-08-14 DIAGNOSIS — G8929 Other chronic pain: Secondary | ICD-10-CM | POA: Insufficient documentation

## 2023-08-14 DIAGNOSIS — M25562 Pain in left knee: Secondary | ICD-10-CM | POA: Insufficient documentation

## 2023-08-14 DIAGNOSIS — M6281 Muscle weakness (generalized): Secondary | ICD-10-CM | POA: Insufficient documentation

## 2023-08-14 DIAGNOSIS — R2689 Other abnormalities of gait and mobility: Secondary | ICD-10-CM | POA: Insufficient documentation

## 2023-08-14 NOTE — Therapy (Signed)
 OUTPATIENT PHYSICAL THERAPY TREATMENT   Patient Name: Kathy Duncan MRN: [id number] DOB:1956/04/10, 68 y.o., female Today's Date: 08/14/2023   END OF SESSION:  PT End of Session - 08/14/23 1402     Visit Number 6    Number of Visits 17    Date for PT Re-Evaluation 09/20/23    Authorization Type [employer] M[employer]    Authorization Time Period 07/30/2023 - 09/15/2023    Authorization - Visit Number 5    Authorization - Number of Visits 12    PT Start Time 0202    PT Stop Time 0245    PT Time Calculation (min) 43 min                 Past Medical History:  Diagnosis Date   Abnormal vaginal bleeding 07/29/2013   Anemia 07/12/2006   09/12/11: Anemia panel not suggestive of iron or B12 deficiency.  CBC with diff shows no pancytopenia.  Colonoscopy 2008 and referral in 2010 for heme positive stools not suggestive of need for further evaluation.  Most likely due to tamoxifen.    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   Vitreomacular adhesion of right eye 05/04/2020   Weight loss 07/09/2015   Past Surgical History:  Procedure Laterality Date   ABDOMINAL HYSTERECTOMY     has ovaries   BREAST EXCISIONAL BIOPSY Right 09/2009   TOTAL KNEE ARTHROPLASTY Right 03/01/2017   Procedure: RIGHT TOTAL KNEE ARTHROPLASTY;  Surgeon: Jene Every, MD;  Location: [facility];  Service: Orthopedics;  Laterality: Right;  120 mins with abductor block   Patient Active Problem List   Diagnosis Date Noted   Left leg pain 06/27/2023   Vulval lesion 06/12/2023   Boil of buttock 10/17/2022   Nonimmune to hepatitis B virus 06/27/2022   Type 2 diabetes mellitus with other specified complication (HCC) 06/13/2022   Posterior vitreous detachment of both eyes  02/02/2022   Pseudophakia of both eyes 02/02/2022   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) 04/29/2021   Essential hypertension 11/01/2020   Coronary artery disease involving native coronary artery of native heart without angina pectoris 06/17/2020   Aortic atherosclerosis (HCC) 06/17/2020   Retinal hemorrhage of left eye 05/04/2020   Atypical ductal hyperplasia of breast 12/15/2010   Hyperlipidemia associated with type 2 diabetes mellitus (HCC) 12/28/2006   CARPAL TUNNEL SYNDROME, BILATERAL 12/28/2006   OBESITY, NOS 07/12/2006    PCP: Doreene Eland, MD  REFERRING PROVIDER: Doreene Eland, MD  REFERRING DIAG: Left leg pain  THERAPY DIAG:  Chronic pain of left knee  Muscle weakness (generalized)  Other abnormalities of gait and mobility  Rationale for Evaluation and Treatment: Rehabilitation  ONSET DATE: Chronic > 2 years   SUBJECTIVE:  SUBJECTIVE STATEMENT: Pt presents to PT with reports of continued L knee pain, although it is starting to get better. Has been compliant with HEP with no adverse effect.   EVAL: Patient reports sometimes when she walks she gets a terrible pain around the left knee and down the lower leg to the ankle. The pain has been going on for over 2 years but she didn't tell the doctor until it got so bad it affected her  walking. Typically she can walk 1-15 minutes before it starts really aching but she just keeps walking. Also sometimes when she tries to relax at night she will get pain, and sometimes it will wake her up at night. She does have trouble going up and down stairs and she has to go up 13 at her apartment so holds on the the rail. She did get a knee replacement on the right side in 2018 and it is doing good.   PERTINENT HISTORY: See PMH above  PAIN:  Are you having pain? Yes:  NPRS scale: 5/10 currently, 10/10 at worst with walking Pain location: Left knee  Pain description: Aching Aggravating factors: Walking,  stairs, nocturnal pain Relieving factors: Medication, creams  PRECAUTIONS: None  WEIGHT BEARING RESTRICTIONS: No   PATIENT GOALS: Pain relief with walking   OBJECTIVE:  Note: Objective measures were completed at Evaluation unless otherwise noted. PATIENT SURVEYS:  LEFS 70/80  MUSCLE LENGTH: Hamstring tightness  POSTURE:   Bilateral knee varus  PALPATION: Tender to palpation parapatellar region, medial joint line, anterior tibia  LOWER EXTREMITY ROM:  Active ROM Right eval Left eval Left 08/07/2023 Left 08/09/2023  Hip flexion      Hip extension      Hip abduction      Hip adduction      Hip internal rotation      Hip external rotation      Knee flexion 92 105    Knee extension 0 Lacking 5 Lacking 5 Lacking 4  Ankle dorsiflexion      Ankle plantarflexion      Ankle inversion      Ankle eversion       (Blank rows = not tested)  LOWER EXTREMITY MMT:  MMT Right eval Left eval  Hip flexion 4 4  Hip extension    Hip abduction 4- 3  Hip adduction    Hip internal rotation    Hip external rotation    Knee flexion    Knee extension 5 4  Ankle dorsiflexion 5 4  Ankle plantarflexion    Ankle inversion    Ankle eversion     (Blank rows = not tested)  FUNCTIONAL TESTS:  5 times sit to stand: 17 seconds 6 minute walk test: 1,110 ft  GAIT: Distance walked: 1,110 ft Assistive device utilized: None Level of assistance: Complete Independence Comments: Antalgic gait on left, compensated trendelenburg                                                                                                                                TREATMENT  [facility] Adult PT Treatment:                                                DATE: 08/14/2023 Heel raise 10 x 2  Slant board calf stretch 2x30 sec Supine SLR 2x10 each 2# Supine QS with block L x 10 - 5" hold Hooklying clamshell 2x15 blue band LAQ 3x10 5# Standing TKE with ball 2x10 L  Hip abdct RTB 10 x 2 each  Standing hip ext  2x10 RTB Bridge 10 x  2  Side hip abduction 10 x 2 left  Therapeutic Activity: Step up 6in L lead 2x10 fwd - 1 UE STS 2x10 5# - low chair  NuStep L5 x 5 min with LE to improve LE endurance and workload capacity Functional squat 2x10 - B UE   [facility] Adult PT Treatment:                                                DATE: 08/09/2023 Slant board calf stretch 2x30 sec STS 2x10 5# - high table Supine SLR 2x10 each Supine QS with block L x 10 - 5" hold Hooklying clamshell 2x15 blue band LAQ 3x10 4# Standing TKE with ball 2x10 L  Lateral walk YTB x 3 laps at counter Standing hip ext 2x10 YTB Therapeutic Activity: Step up 6in L lead 2x10 fwd - 1 UE STS 2x10 5# - high table NuStep L5 x 5 min with LE to improve LE endurance and workload capacity Functional squat 2x10 - B UE  [facility] Adult PT Treatment:                                                DATE: 08/07/2023 NuStep L5 x 5 min with LE to improve LE endurance and workload capacity Slant board calf stretch 3 x 20 sec Seated hamstring stretch 3 x 30 sec Hooklying and seated knee joint A/P mobs Supine patellar mobs all directions STM left anterior tib Supine quad set with small towel roll 2 x 10 x 5 sec Seated ankle DF with green 3 x 10 LAQ with 3# 3 x 10 Sit to stand 3 x 10   PATIENT EDUCATION:  Education details: HEP update Person educated: Patient Education method: Explanation, Demonstration, Tactile cues, Verbal cues, and Handouts Education comprehension: verbalized understanding, returned demonstration, verbal cues required, tactile cues required, and needs further education  HOME EXERCISE PROGRAM: Access Code: [email or web address]   ASSESSMENT: CLINICAL IMPRESSION: Pt was able to complete all prescribed exercises with no adverse effect. Exercises today focused once again on improving bilateral quad and proximal hip strength with particular emphasis on L quad as well as improving functional movement with continued squat and repeated step  ups. Pt is progressing well with therapy thus far, no changes to HEP made today. Will continue to progress as able per POC.   EVAL: Patient is a 68 y.o. female who was seen today for physical therapy evaluation and treatment for chronic left knee and lower leg pain. She demonstrates limitations in her knee mobility, strength, and decreased tolerance for activities such as walking, stairs, and transfers that is impacting her functional ability.  OBJECTIVE IMPAIRMENTS: Abnormal gait, decreased activity tolerance, decreased balance, decreased ROM, decreased strength, impaired flexibility, postural dysfunction, and pain.   ACTIVITY LIMITATIONS: standing, squatting, sleeping, stairs, transfers, and locomotion level  PARTICIPATION LIMITATIONS: meal prep, cleaning, shopping, and community activity  PERSONAL FACTORS: Fitness, Past/current experiences, and  Time since onset of injury/illness/exacerbation are also affecting patient's functional outcome.    GOALS: Goals reviewed with patient? Yes  SHORT TERM GOALS: Target date: 08/23/2023  Patient will be I with initial HEP in order to progress with therapy. Baseline: HEP provided at eval Goal status: INITIAL  2.  Patient will report left knee pain with activity </= 6/10 in order to reduce functional limitation Baseline: 10/10 at worst Goal status: INITIAL  3.  Patient will demonstrate left knee extension to neutral to improve gait Baseline: lacking 5 deg Goal status: INITIAL  LONG TERM GOALS: Target date: 09/20/2023  Patient will be I with final HEP to maintain progress from PT. Baseline: HEP provided at eval Goal status: INITIAL  2.  Patient will report LEFS >/= 75/80 in order to indicate improvement in functional status Baseline: 70/80 Goal status: INITIAL  3.  Patient will demonstrate left knee strength 5/5 MMT in order to improve walking tolerance Baseline: 4/5 MMT Goal status: INITIAL  4.  Patient will perform 5xSTS </= 12 seconds  to indicate improvement in LE strength and reduce fall risk Baseline: 17 seconds Goal status: INITIAL  5.  Patient will perform >/= 1500 ft in order to improve community access Baseline: 1,110 ft Goal status: INITIAL  6.  Patient will report left knee pain </= 3/10 with extended periods of walking to reduce functional limitations Baseline: 10/10 pain Goal status: INITIAL   PLAN: PT FREQUENCY: 1-2x/week  PT DURATION: 8 weeks  PLANNED INTERVENTIONS: 97164- PT Re-evaluation, 97110-Therapeutic exercises, 97530- Therapeutic activity, 97112- Neuromuscular re-education, 97535- Self Care, 91478- Manual therapy, [phone]- Gait training, [phone]- Aquatic Therapy, Patient/Family education, Balance training, Stair training, Taping, Dry Needling, Joint mobilization, Joint manipulation, Cryotherapy, and Moist heat  PLAN FOR NEXT SESSION: Review HEP and progress PRN, manual/stretching to improve left knee extension, progress left knee and hip strengthening, balance training   Jannette Spanner, PTA 08/14/23 2:30 PM Phone: [phone] Fax: [phone]

## 2023-08-16 ENCOUNTER — Encounter: Payer: Self-pay | Admitting: Physical Therapy

## 2023-08-16 ENCOUNTER — Ambulatory Visit: Admitting: Physical Therapy

## 2023-08-16 DIAGNOSIS — M25562 Pain in left knee: Secondary | ICD-10-CM | POA: Diagnosis not present

## 2023-08-16 DIAGNOSIS — G8929 Other chronic pain: Secondary | ICD-10-CM

## 2023-08-16 DIAGNOSIS — M6281 Muscle weakness (generalized): Secondary | ICD-10-CM | POA: Diagnosis not present

## 2023-08-16 DIAGNOSIS — R2689 Other abnormalities of gait and mobility: Secondary | ICD-10-CM | POA: Diagnosis not present

## 2023-08-16 NOTE — Therapy (Signed)
 OUTPATIENT PHYSICAL THERAPY TREATMENT   Patient Name: Kathy Duncan MRN: [id number] DOB:11-20-1955, 68 y.o., female Today's Date: 08/16/2023   END OF SESSION:  PT End of Session - 08/16/23 1400     Visit Number 7    Number of Visits 17    Date for PT Re-Evaluation 09/20/23    Authorization Type [employer] MCR    Authorization Time Period 07/30/2023 - 09/15/2023    Authorization - Visit Number 6    Authorization - Number of Visits 12    PT Start Time 0200    PT Stop Time 0245    PT Time Calculation (min) 45 min                 Past Medical History:  Diagnosis Date   Abnormal vaginal bleeding 07/29/2013   Anemia 07/12/2006   09/12/11: Anemia panel not suggestive of iron or B12 deficiency.  CBC with diff shows no pancytopenia.  Colonoscopy 2008 and referral in 2010 for heme positive stools not suggestive of need for further evaluation.  Most likely due to tamoxifen.    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   Vitreomacular adhesion of right eye 05/04/2020   Weight loss 07/09/2015   Past Surgical History:  Procedure Laterality Date   ABDOMINAL HYSTERECTOMY     has ovaries   BREAST EXCISIONAL BIOPSY Right 09/2009   TOTAL KNEE ARTHROPLASTY Right 03/01/2017   Procedure: RIGHT TOTAL KNEE ARTHROPLASTY;  Surgeon: Jene Every, MD;  Location: [facility];  Service: Orthopedics;  Laterality: Right;  120 mins with abductor block   Patient Active Problem List   Diagnosis Date Noted   Left leg pain 06/27/2023   Vulval lesion 06/12/2023   Boil of buttock 10/17/2022   Nonimmune to hepatitis B virus 06/27/2022   Type 2 diabetes mellitus with other specified complication (HCC) 06/13/2022   Posterior vitreous detachment of both eyes  02/02/2022   Pseudophakia of both eyes 02/02/2022   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) 04/29/2021   Essential hypertension 11/01/2020   Coronary artery disease involving native coronary artery of native heart without angina pectoris 06/17/2020   Aortic atherosclerosis (HCC) 06/17/2020   Retinal hemorrhage of left eye 05/04/2020   Atypical ductal hyperplasia of breast 12/15/2010   Hyperlipidemia associated with type 2 diabetes mellitus (HCC) 12/28/2006   CARPAL TUNNEL SYNDROME, BILATERAL 12/28/2006   OBESITY, NOS 07/12/2006    PCP: Doreene Eland, MD  REFERRING PROVIDER: Doreene Eland, MD  REFERRING DIAG: Left leg pain  THERAPY DIAG:  Chronic pain of left knee  Muscle weakness (generalized)  Other abnormalities of gait and mobility  Rationale for Evaluation and Treatment: Rehabilitation  ONSET DATE: Chronic > 2 years   SUBJECTIVE:  SUBJECTIVE STATEMENT: Pt presents to PT with reports of continued L knee pain, although pain level is lower, 3/10.   EVAL: Patient reports sometimes when she walks she gets a terrible pain around the left knee and down the lower leg to the ankle. The pain has been going on for over 2 years but she didn't tell the doctor until it got so bad it affected her walking. Typically she can walk 1-15 minutes before it starts  really aching but she just keeps walking. Also sometimes when she tries to relax at night she will get pain, and sometimes it will wake her up at night. She does have trouble going up and down stairs and she has to go up 13 at her apartment so holds on the the rail. She did get a knee replacement on the right side in 2018 and it is doing good.   PERTINENT HISTORY: See PMH above  PAIN:  Are you having pain? Yes:  NPRS scale: 3/10 currently, 10/10 at worst with walking Pain location: Left knee  Pain description: Aching Aggravating factors: Walking, stairs, nocturnal pain Relieving factors: Medication,  creams  PRECAUTIONS: None  WEIGHT BEARING RESTRICTIONS: No   PATIENT GOALS: Pain relief with walking   OBJECTIVE:  Note: Objective measures were completed at Evaluation unless otherwise noted. PATIENT SURVEYS:  LEFS 70/80  MUSCLE LENGTH: Hamstring tightness  POSTURE:   Bilateral knee varus  PALPATION: Tender to palpation parapatellar region, medial joint line, anterior tibia  LOWER EXTREMITY ROM:  Active ROM Right eval Left eval Left 08/07/2023 Left 08/09/2023  Hip flexion      Hip extension      Hip abduction      Hip adduction      Hip internal rotation      Hip external rotation      Knee flexion 92 105    Knee extension 0 Lacking 5 Lacking 5 Lacking 4  Ankle dorsiflexion      Ankle plantarflexion      Ankle inversion      Ankle eversion       (Blank rows = not tested)  LOWER EXTREMITY MMT:  MMT Right eval Left eval  Hip flexion 4 4  Hip extension    Hip abduction 4- 3  Hip adduction    Hip internal rotation    Hip external rotation    Knee flexion    Knee extension 5 4  Ankle dorsiflexion 5 4  Ankle plantarflexion    Ankle inversion    Ankle eversion     (Blank rows = not tested)  FUNCTIONAL TESTS:  5 times sit to stand: 17 seconds 6 minute walk test: 1,110 ft  GAIT: Distance walked: 1,110 ft Assistive device utilized: None Level of assistance: Complete Independence Comments: Antalgic gait on left, compensated trendelenburg                                                                                                                                TREATMENT  [facility] Adult PT Treatment:                                                DATE: 08/16/2023 Heel raise 10 x 2  Slant board calf stretch 2x30 sec Supine SLR 2x10  left Supine QS with block L x 10 - 5" hold Supine h/s curls on ball 10 x 2  LAQ 3x10 5# Standing TKE with blue band  2x10 L  Hip abdct RTB 10 x 2 each  Standing hip ext 2x10 RTB Bridge 10 x  2 - feet over ball  Side hip  abduction 10 x 2 left  Therapeutic Activity: Step up 6in L lead 2x10 fwd - 1 UE Lateral step down L 2 x 10 1 UE STS 3x10 5# - low chair  NuStep L5 x 5 min with LE to improve LE endurance and workload capacity Functional squat 2x10 - B UE   [facility] Adult PT Treatment:                                                DATE: 08/14/2023 Heel raise 10 x 2  Slant board calf stretch 2x30 sec Supine SLR 2x10 each 2# Supine QS with block L x 10 - 5" hold Hooklying clamshell 2x15 blue band LAQ 3x10 5# Standing TKE with ball 2x10 L  Hip abdct RTB 10 x 2 each  Standing hip ext 2x10 RTB Bridge 10 x  2  Side hip abduction 10 x 2 left  Therapeutic Activity: Step up 6in L lead 2x10 fwd - 1 UE STS 2x10 5# - low chair  NuStep L5 x 5 min with LE to improve LE endurance and workload capacity Functional squat 2x10 - B UE   [facility] Adult PT Treatment:                                                DATE: 08/09/2023 Slant board calf stretch 2x30 sec STS 2x10 5# - high table Supine SLR 2x10 each Supine QS with block L x 10 - 5" hold Hooklying clamshell 2x15 blue band LAQ 3x10 4# Standing TKE with ball 2x10 L  Lateral walk YTB x 3 laps at counter Standing hip ext 2x10 YTB Therapeutic Activity: Step up 6in L lead 2x10 fwd - 1 UE STS 2x10 5# - high table NuStep L5 x 5 min with LE to improve LE endurance and workload capacity Functional squat 2x10 - B UE  [facility] Adult PT Treatment:                                                DATE: 08/07/2023 NuStep L5 x 5 min with LE to improve LE endurance and workload capacity Slant board calf stretch 3 x 20 sec Seated hamstring stretch 3 x 30 sec Hooklying and seated knee joint A/P mobs Supine patellar mobs all directions STM left anterior tib Supine quad set with small towel roll 2 x 10 x 5 sec Seated ankle DF with green 3 x 10 LAQ with 3# 3 x 10 Sit to stand 3 x 10   PATIENT EDUCATION:  Education details: HEP update Person educated: Patient Education method:  Explanation, Demonstration, Tactile cues, Verbal cues, and Handouts Education comprehension: verbalized understanding, returned demonstration, verbal cues required, tactile cues required, and needs further education  HOME EXERCISE PROGRAM: Access Code: [email or web address]  ASSESSMENT: CLINICAL IMPRESSION: Pt was able to complete all prescribed exercises with no adverse effect. Exercises today focused once again on improving bilateral quad and proximal hip strength with particular emphasis on L quad as well as improving functional movement with continued squat and repeated step ups. Pt is progressing well with therapy thus far, no changes to HEP made today. Will continue to progress as able per POC.   EVAL: Patient is a 68 y.o. female who was seen today for physical therapy evaluation and treatment for chronic left knee and lower leg pain. She demonstrates limitations in her knee mobility, strength, and decreased tolerance for activities such as walking, stairs, and transfers that is impacting her functional ability.  OBJECTIVE IMPAIRMENTS: Abnormal gait, decreased activity tolerance, decreased balance, decreased ROM, decreased strength, impaired flexibility, postural dysfunction, and pain.   ACTIVITY LIMITATIONS: standing, squatting, sleeping, stairs, transfers, and locomotion level  PARTICIPATION LIMITATIONS: meal prep, cleaning, shopping, and community activity  PERSONAL FACTORS: Fitness, Past/current experiences, and Time since onset of injury/illness/exacerbation are also affecting patient's functional outcome.    GOALS: Goals reviewed with patient? Yes  SHORT TERM GOALS: Target date: 08/23/2023  Patient will be I with initial HEP in order to progress with therapy. Baseline: HEP provided at eval Goal status: INITIAL  2.  Patient will report left knee pain with activity </= 6/10 in order to reduce functional limitation Baseline: 10/10 at worst Goal status: INITIAL  3.  Patient will  demonstrate left knee extension to neutral to improve gait Baseline: lacking 5 deg Goal status: INITIAL  LONG TERM GOALS: Target date: 09/20/2023  Patient will be I with final HEP to maintain progress from PT. Baseline: HEP provided at eval Goal status: INITIAL  2.  Patient will report LEFS >/= 75/80 in order to indicate improvement in functional status Baseline: 70/80 Goal status: INITIAL  3.  Patient will demonstrate left knee strength 5/5 MMT in order to improve walking tolerance Baseline: 4/5 MMT Goal status: INITIAL  4.  Patient will perform 5xSTS </= 12 seconds to indicate improvement in LE strength and reduce fall risk Baseline: 17 seconds Goal status: INITIAL  5.  Patient will perform >/= 1500 ft in order to improve community access Baseline: 1,110 ft Goal status: INITIAL  6.  Patient will report left knee pain </= 3/10 with extended periods of walking to reduce functional limitations Baseline: 10/10 pain Goal status: INITIAL   PLAN: PT FREQUENCY: 1-2x/week  PT DURATION: 8 weeks  PLANNED INTERVENTIONS: 97164- PT Re-evaluation, 97110-Therapeutic exercises, 97530- Therapeutic activity, 97112- Neuromuscular re-education, 97535- Self Care, 16109- Manual therapy, [phone]- Gait training, [phone]- Aquatic Therapy, Patient/Family education, Balance training, Stair training, Taping, Dry Needling, Joint mobilization, Joint manipulation, Cryotherapy, and Moist heat  PLAN FOR NEXT SESSION: Review HEP and progress PRN, manual/stretching to improve left knee extension, progress left knee and hip strengthening, balance training   Jannette Spanner, PTA 08/16/23 2:46 PM Phone: [phone] Fax: [phone]

## 2023-08-21 ENCOUNTER — Ambulatory Visit

## 2023-08-21 DIAGNOSIS — G8929 Other chronic pain: Secondary | ICD-10-CM | POA: Diagnosis not present

## 2023-08-21 DIAGNOSIS — R2689 Other abnormalities of gait and mobility: Secondary | ICD-10-CM | POA: Diagnosis not present

## 2023-08-21 DIAGNOSIS — M6281 Muscle weakness (generalized): Secondary | ICD-10-CM

## 2023-08-21 DIAGNOSIS — M25562 Pain in left knee: Secondary | ICD-10-CM | POA: Diagnosis not present

## 2023-08-21 NOTE — Therapy (Signed)
 OUTPATIENT PHYSICAL THERAPY TREATMENT   Patient Name: Kathy Duncan MRN: [id number] DOB:[birth year], 68 y.o., female Today's Date: 08/21/2023   END OF SESSION:  PT End of Session - 08/21/23 1536     Visit Number 8    Number of Visits 17    Date for PT Re-Evaluation 09/20/23    Authorization Type [employer] M[employer]    Authorization Time Period 07/30/2023 - 09/15/2023    Authorization - Visit Number 7    Authorization - Number of Visits 12    PT Start Time 1532    PT Stop Time 1612    PT Time Calculation (min) 40 min    Activity Tolerance Patient tolerated treatment well    Behavior During Therapy [facility] for tasks assessed/performed                 Past Medical History:  Diagnosis Date   Abnormal vaginal bleeding 07/29/2013   Anemia 07/12/2006   09/12/11: Anemia panel not suggestive of iron or B12 deficiency.  CBC with diff shows no pancytopenia.  Colonoscopy 2008 and referral in 2010 for heme positive stools not suggestive of need for further evaluation.  Most likely due to tamoxifen.    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   Vitreomacular adhesion of right eye 05/04/2020   Weight loss 07/09/2015   Past Surgical History:  Procedure Laterality Date   ABDOMINAL HYSTERECTOMY     has ovaries   BREAST EXCISIONAL BIOPSY Right 09/2009   TOTAL KNEE ARTHROPLASTY Right 03/01/2017   Procedure: RIGHT TOTAL KNEE ARTHROPLASTY;  Surgeon: Jene Every, MD;  Location: [facility];  Service: Orthopedics;  Laterality: Right;  120 mins with abductor block   Patient Active Problem List   Diagnosis Date Noted   Left leg pain 06/27/2023   Vulval lesion 06/12/2023   Boil of buttock 10/17/2022   Nonimmune to hepatitis B virus 06/27/2022   Type 2  diabetes mellitus with other specified complication (HCC) 06/13/2022   Posterior vitreous detachment of both eyes 02/02/2022   Pseudophakia of both eyes 02/02/2022   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) 04/29/2021   Essential hypertension 11/01/2020   Coronary artery disease involving native coronary artery of native heart without angina pectoris 06/17/2020   Aortic atherosclerosis (HCC) 06/17/2020   Retinal hemorrhage of left eye 05/04/2020   Atypical ductal hyperplasia of breast 12/15/2010   Hyperlipidemia associated with type 2 diabetes mellitus (HCC) 12/28/2006   CARPAL TUNNEL SYNDROME, BILATERAL 12/28/2006   OBESITY, NOS 07/12/2006    PCP: Doreene Eland, MD  REFERRING PROVIDER: Doreene Eland, MD  REFERRING DIAG: Left leg pain  THERAPY DIAG:  Chronic pain of left knee  Muscle weakness (generalized)  Other abnormalities of gait and mobility  Rationale for Evaluation and Treatment: Rehabilitation  ONSET DATE: Chronic > 2 years   SUBJECTIVE:  SUBJECTIVE STATEMENT: Pt presents to PT with reports of continued L knee pain, although pain level is lower, 3/10.   EVAL: Patient reports sometimes when she walks she gets a terrible pain around the left knee and down the lower leg to the ankle. The pain has been going on for over 2 years but she didn't tell the  doctor until it got so bad it affected her walking. Typically she can walk 1-15 minutes before it starts really aching but she just keeps walking. Also sometimes when she tries to relax at night she will get pain, and sometimes it will wake her up at night. She does have trouble going up and down stairs and she has to go up 13 at her apartment so holds on the the rail. She did get a knee replacement on the right side in 2018 and it is doing good.   PERTINENT HISTORY: See PMH above  PAIN:  Are you having pain? Yes:  NPRS scale: 3/10 currently, 10/10 at worst with walking Pain location: Left  knee  Pain description: Aching Aggravating factors: Walking, stairs, nocturnal pain Relieving factors: Medication, creams  PRECAUTIONS: None  WEIGHT BEARING RESTRICTIONS: No   PATIENT GOALS: Pain relief with walking   OBJECTIVE:  Note: Objective measures were completed at Evaluation unless otherwise noted. PATIENT SURVEYS:  LEFS 70/80  MUSCLE LENGTH: Hamstring tightness  POSTURE:   Bilateral knee varus  PALPATION: Tender to palpation parapatellar region, medial joint line, anterior tibia  LOWER EXTREMITY ROM:  Active ROM Right eval Left eval Left 08/07/2023 Left 08/09/2023 Left 08/21/23  Hip flexion       Hip extension       Hip abduction       Hip adduction       Hip internal rotation       Hip external rotation       Knee flexion 92 105     Knee extension 0 Lacking 5 Lacking 5 Lacking 4 Lacking 2  Ankle dorsiflexion       Ankle plantarflexion       Ankle inversion       Ankle eversion        (Blank rows = not tested)  LOWER EXTREMITY MMT:  MMT Right eval Left eval  Hip flexion 4 4  Hip extension    Hip abduction 4- 3  Hip adduction    Hip internal rotation    Hip external rotation    Knee flexion    Knee extension 5 4  Ankle dorsiflexion 5 4  Ankle plantarflexion    Ankle inversion    Ankle eversion     (Blank rows = not tested)  FUNCTIONAL TESTS:  5 times sit to stand: 17 seconds 6 minute walk test: 1,110 ft  GAIT: Distance walked: 1,110 ft Assistive device utilized: None Level of assistance: Complete Independence Comments: Antalgic gait on left, compensated trendelenburg                                                                                                                                TREATMENT  [facility] Adult PT Treatment:  DATE: 08/21/2023 Therapeutic Exercise:  Slant board calf stretch 2x30 sec Standing TKE with green powercord x 10 L Supine QS with block L x 10 - 5" hold Supine  SLR 3x10 2# Bridge with GTB 2x10 S/L clamshell 2x15 GTB LAQ 3x10 5# Therapeutic Activity: Step up 8in L lead 2x10 fwd - 1 UE STS 3x10 10# - low table NuStep L5 x 5 min with LE to improve LE endurance and workload capacity Functional squat 2x10 - B UE  [facility] Adult PT Treatment:                                                DATE: 08/16/2023 Heel raise 10 x 2  Slant board calf stretch 2x30 sec Supine SLR 2x10 left Supine QS with block L x 10 - 5" hold Supine h/s curls on ball 10 x 2  LAQ 3x10 5# Standing TKE with blue band  2x10 L  Hip abdct RTB 10 x 2 each  Standing hip ext 2x10 RTB Bridge 10 x  2 - feet over ball  Side hip abduction 10 x 2 left  Therapeutic Activity: Step up 6in L lead 2x10 fwd - 1 UE Lateral step down L 2 x 10 1 UE STS 3x10 5# - low chair  NuStep L5 x 5 min with LE to improve LE endurance and workload capacity Functional squat 2x10 - B UE   [facility] Adult PT Treatment:                                                DATE: 08/14/2023 Heel raise 10 x 2  Slant board calf stretch 2x30 sec Supine SLR 2x10 each 2# Supine QS with block L x 10 - 5" hold Hooklying clamshell 2x15 blue band LAQ 3x10 5# Standing TKE with ball 2x10 L  Hip abdct RTB 10 x 2 each  Standing hip ext 2x10 RTB Bridge 10 x  2  Side hip abduction 10 x 2 left  Therapeutic Activity: Step up 6in L lead 2x10 fwd - 1 UE STS 2x10 5# - low chair  NuStep L5 x 5 min with LE to improve LE endurance and workload capacity Functional squat 2x10 - B UE  PATIENT EDUCATION:  Education details: HEP update Person educated: Patient Education method: Explanation, Demonstration, Tactile cues, Verbal cues, and Handouts Education comprehension: verbalized understanding, returned demonstration, verbal cues required, tactile cues required, and needs further education  HOME EXERCISE PROGRAM: Access Code: [email or web address]   ASSESSMENT: CLINICAL IMPRESSION: Pt was able to complete all prescribed exercises with no adverse  effect. Exercises today focused once again on improving bilateral quad and proximal hip strength with particular emphasis on L quad as well as improving functional movement with continued functional squats, step ups, and transfers. Pt is progressing well with therapy thus far, no changes to HEP made today. Will continue to progress as able per POC.   EVAL: Patient is a 68 y.o. female who was seen today for physical therapy evaluation and treatment for chronic left knee and lower leg pain. She demonstrates limitations in her knee mobility, strength, and decreased tolerance for activities such as walking, stairs, and transfers that is impacting her functional ability.  OBJECTIVE IMPAIRMENTS: Abnormal gait,  decreased activity tolerance, decreased balance, decreased ROM, decreased strength, impaired flexibility, postural dysfunction, and pain.   ACTIVITY LIMITATIONS: standing, squatting, sleeping, stairs, transfers, and locomotion level  PARTICIPATION LIMITATIONS: meal prep, cleaning, shopping, and community activity  PERSONAL FACTORS: Fitness, Past/current experiences, and Time since onset of injury/illness/exacerbation are also affecting patient's functional outcome.    GOALS: Goals reviewed with patient? Yes  SHORT TERM GOALS: Target date: 08/23/2023  Patient will be I with initial HEP in order to progress with therapy. Baseline: HEP provided at eval Goal status: MET  2.  Patient will report left knee pain with activity </= 6/10 in order to reduce functional limitation Baseline: 10/10 at worst Goal status: MET  3.  Patient will demonstrate left knee extension to neutral to improve gait Baseline: lacking 5 deg Goal status: INITIAL  LONG TERM GOALS: Target date: 09/20/2023  Patient will be I with final HEP to maintain progress from PT. Baseline: HEP provided at eval Goal status: INITIAL  2.  Patient will report LEFS >/= 75/80 in order to indicate improvement in functional  status Baseline: 70/80 Goal status: INITIAL  3.  Patient will demonstrate left knee strength 5/5 MMT in order to improve walking tolerance Baseline: 4/5 MMT Goal status: INITIAL  4.  Patient will perform 5xSTS </= 12 seconds to indicate improvement in LE strength and reduce fall risk Baseline: 17 seconds Goal status: INITIAL  5.  Patient will perform >/= 1500 ft in order to improve community access Baseline: 1,110 ft Goal status: INITIAL  6.  Patient will report left knee pain </= 3/10 with extended periods of walking to reduce functional limitations Baseline: 10/10 pain Goal status: INITIAL   PLAN: PT FREQUENCY: 1-2x/week  PT DURATION: 8 weeks  PLANNED INTERVENTIONS: 97164- PT Re-evaluation, 97110-Therapeutic exercises, 97530- Therapeutic activity, 97112- Neuromuscular re-education, 97535- Self Care, 28413- Manual therapy, [phone]- Gait training, [phone]- Aquatic Therapy, Patient/Family education, Balance training, Stair training, Taping, Dry Needling, Joint mobilization, Joint manipulation, Cryotherapy, and Moist heat  PLAN FOR NEXT SESSION: Review HEP and progress PRN, manual/stretching to improve left knee extension, progress left knee and hip strengthening, balance training   Eloy End PT  08/21/23 4:21 PM

## 2023-08-23 ENCOUNTER — Encounter: Payer: Self-pay | Admitting: Physical Therapy

## 2023-08-23 ENCOUNTER — Ambulatory Visit: Admitting: Physical Therapy

## 2023-08-23 DIAGNOSIS — R2689 Other abnormalities of gait and mobility: Secondary | ICD-10-CM | POA: Diagnosis not present

## 2023-08-23 DIAGNOSIS — M25562 Pain in left knee: Secondary | ICD-10-CM | POA: Diagnosis not present

## 2023-08-23 DIAGNOSIS — M6281 Muscle weakness (generalized): Secondary | ICD-10-CM

## 2023-08-23 DIAGNOSIS — G8929 Other chronic pain: Secondary | ICD-10-CM

## 2023-08-23 NOTE — Therapy (Signed)
 OUTPATIENT PHYSICAL THERAPY TREATMENT   Patient Name: Kathy Duncan MRN: [id number] DOB:1956-03-08, 68 y.o., female Today's Date: 08/23/2023   END OF SESSION:  PT End of Session - 08/23/23 1440     Visit Number 9    Number of Visits 17    Date for PT Re-Evaluation 09/20/23    Authorization Type [employer] MCR    Authorization Time Period 07/30/2023 - 09/15/2023    Authorization - Visit Number 8    Authorization - Number of Visits 12    PT Start Time 1400    PT Stop Time 1443    PT Time Calculation (min) 43 min                  Past Medical History:  Diagnosis Date   Abnormal vaginal bleeding 07/29/2013   Anemia 07/12/2006   09/12/11: Anemia panel not suggestive of iron or B12 deficiency.  CBC with diff shows no pancytopenia.  Colonoscopy 2008 and referral in 2010 for heme positive stools not suggestive of need for further evaluation.  Most likely due to tamoxifen.    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   Vitreomacular adhesion of right eye 05/04/2020   Weight loss 07/09/2015   Past Surgical History:  Procedure Laterality Date   ABDOMINAL HYSTERECTOMY     has ovaries   BREAST EXCISIONAL BIOPSY Right 09/2009   TOTAL KNEE ARTHROPLASTY Right 03/01/2017   Procedure: RIGHT TOTAL KNEE ARTHROPLASTY;  Surgeon: Jene Every, MD;  Location: [facility];  Service: Orthopedics;  Laterality: Right;  120 mins with abductor block   Patient Active Problem List   Diagnosis Date Noted   Left leg pain 06/27/2023   Vulval lesion 06/12/2023   Boil of buttock 10/17/2022   Nonimmune to hepatitis B virus 06/27/2022   Type 2 diabetes mellitus with other specified complication (HCC) 06/13/2022   Posterior vitreous detachment of both  eyes 02/02/2022   Pseudophakia of both eyes 02/02/2022   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) 04/29/2021   Essential hypertension 11/01/2020   Coronary artery disease involving native coronary artery of native heart without angina pectoris 06/17/2020   Aortic atherosclerosis (HCC) 06/17/2020   Retinal hemorrhage of left eye 05/04/2020   Atypical ductal hyperplasia of breast 12/15/2010   Hyperlipidemia associated with type 2 diabetes mellitus (HCC) 12/28/2006   CARPAL TUNNEL SYNDROME, BILATERAL 12/28/2006   OBESITY, NOS 07/12/2006    PCP: Doreene Eland, MD  REFERRING PROVIDER: Doreene Eland, MD  REFERRING DIAG: Left leg pain  THERAPY DIAG:  Chronic pain of left knee  Muscle weakness (generalized)  Other abnormalities of gait and mobility  Rationale for Evaluation and Treatment: Rehabilitation  ONSET DATE: Chronic > 2 years   SUBJECTIVE:  SUBJECTIVE STATEMENT: Pt presents to PT with reports of improved L knee pain, although pain level is lower, 2/10.   EVAL: Patient reports sometimes when she walks she gets a terrible pain around the left knee and down the lower leg to the ankle. The pain has been going on for over 2 years but she didn't tell the doctor until it got so bad it affected her walking. Typically she can walk 1-15 minutes before it  starts really aching but she just keeps walking. Also sometimes when she tries to relax at night she will get pain, and sometimes it will wake her up at night. She does have trouble going up and down stairs and she has to go up 13 at her apartment so holds on the the rail. She did get a knee replacement on the right side in 2018 and it is doing good.   PERTINENT HISTORY: See PMH above  PAIN:  Are you having pain? Yes:  NPRS scale: 2/10 currently, 10/10 at worst with walking Pain location: Left knee  Pain description: Aching Aggravating factors: Walking, stairs, nocturnal pain Relieving factors:  Medication, creams  PRECAUTIONS: None  WEIGHT BEARING RESTRICTIONS: No   PATIENT GOALS: Pain relief with walking   OBJECTIVE:  Note: Objective measures were completed at Evaluation unless otherwise noted. PATIENT SURVEYS:  LEFS 70/80  MUSCLE LENGTH: Hamstring tightness  POSTURE:   Bilateral knee varus  PALPATION: Tender to palpation parapatellar region, medial joint line, anterior tibia  LOWER EXTREMITY ROM:  Active ROM Right eval Left eval Left 08/07/2023 Left 08/09/2023 Left 08/21/23  Hip flexion       Hip extension       Hip abduction       Hip adduction       Hip internal rotation       Hip external rotation       Knee flexion 92 105     Knee extension 0 Lacking 5 Lacking 5 Lacking 4 Lacking 2  Ankle dorsiflexion       Ankle plantarflexion       Ankle inversion       Ankle eversion        (Blank rows = not tested)  LOWER EXTREMITY MMT:  MMT Right eval Left eval  Hip flexion 4 4  Hip extension    Hip abduction 4- 3  Hip adduction    Hip internal rotation    Hip external rotation    Knee flexion    Knee extension 5 4  Ankle dorsiflexion 5 4  Ankle plantarflexion    Ankle inversion    Ankle eversion     (Blank rows = not tested)  FUNCTIONAL TESTS:  5 times sit to stand: 17 seconds 6 minute walk test: 1,110 ft  GAIT: Distance walked: 1,110 ft Assistive device utilized: None Level of assistance: Complete Independence Comments: Antalgic gait on left, compensated trendelenburg                                                                                                                                TREATMENT  [facility] Adult PT Treatment:                                                DATE:  08/23/23 Therapeutic Exercise:  Passive knee flexion and ext  Supine SLR 3x10 2# Leg press 20# Left 10 x 2  Knee ext omega 5# right only x 10   Therapeutic Activity: Nustep L5 x 5 minutes  8 inch step up x15 L STS 3x10 10# - low table Standing TKE  blue band x 15 left   Manual therapy: Medial patella creep Hooklying knee flexion and ext mobs  Supine QS Prone QS     [facility] Adult PT Treatment:                                                DATE: 08/21/2023 Therapeutic Exercise:  Slant board calf stretch 2x30 sec Standing TKE with green powercord x 10 L Supine QS with block L x 10 - 5" hold Supine SLR 3x10 2# Bridge with GTB 2x10 S/L clamshell 2x15 GTB LAQ 3x10 5# Therapeutic Activity: Step up 8in L lead 2x10 fwd - 1 UE STS 3x10 10# - low table NuStep L5 x 5 min with LE to improve LE endurance and workload capacity Functional squat 2x10 - B UE  [facility] Adult PT Treatment:                                                DATE: 08/16/2023 Heel raise 10 x 2  Slant board calf stretch 2x30 sec Supine SLR 2x10 left Supine QS with block L x 10 - 5" hold Supine h/s curls on ball 10 x 2  LAQ 3x10 5# Standing TKE with blue band  2x10 L  Hip abdct RTB 10 x 2 each  Standing hip ext 2x10 RTB Bridge 10 x  2 - feet over ball  Side hip abduction 10 x 2 left  Therapeutic Activity: Step up 6in L lead 2x10 fwd - 1 UE Lateral step down L 2 x 10 1 UE STS 3x10 5# - low chair  NuStep L5 x 5 min with LE to improve LE endurance and workload capacity Functional squat 2x10 - B UE   [facility] Adult PT Treatment:                                                DATE: 08/14/2023 Heel raise 10 x 2  Slant board calf stretch 2x30 sec Supine SLR 2x10 each 2# Supine QS with block L x 10 - 5" hold Hooklying clamshell 2x15 blue band LAQ 3x10 5# Standing TKE with ball 2x10 L  Hip abdct RTB 10 x 2 each  Standing hip ext 2x10 RTB Bridge 10 x  2  Side hip abduction 10 x 2 left  Therapeutic Activity: Step up 6in L lead 2x10 fwd - 1 UE STS 2x10 5# - low chair  NuStep L5 x 5 min with LE to improve LE endurance and workload capacity Functional squat 2x10 - B UE  PATIENT EDUCATION:  Education details: HEP update Person educated: Patient Education method:  Explanation, Demonstration, Tactile cues, Verbal cues, and Handouts Education comprehension: verbalized understanding, returned demonstration, verbal cues required, tactile cues required, and needs further education  HOME EXERCISE PROGRAM:  Access Code: [email or web address]   ASSESSMENT: CLINICAL IMPRESSION: Pt was able to complete all prescribed exercises with no adverse effect. Exercises today focused once again on improving bilateral quad and proximal hip strength with particular emphasis on L quad as well as improving functional movement with continued functional squats, step ups, and transfers. Pt is progressing well with therapy thus far, no changes to HEP made today. Able to begin gym machines for strengthening without increased pain. Will continue to progress as able per POC.   EVAL: Patient is a 68 y.o. female who was seen today for physical therapy evaluation and treatment for chronic left knee and lower leg pain. She demonstrates limitations in her knee mobility, strength, and decreased tolerance for activities such as walking, stairs, and transfers that is impacting her functional ability.  OBJECTIVE IMPAIRMENTS: Abnormal gait, decreased activity tolerance, decreased balance, decreased ROM, decreased strength, impaired flexibility, postural dysfunction, and pain.   ACTIVITY LIMITATIONS: standing, squatting, sleeping, stairs, transfers, and locomotion level  PARTICIPATION LIMITATIONS: meal prep, cleaning, shopping, and community activity  PERSONAL FACTORS: Fitness, Past/current experiences, and Time since onset of injury/illness/exacerbation are also affecting patient's functional outcome.    GOALS: Goals reviewed with patient? Yes  SHORT TERM GOALS: Target date: 08/23/2023  Patient will be I with initial HEP in order to progress with therapy. Baseline: HEP provided at eval Goal status: MET  2.  Patient will report left knee pain with activity </= 6/10 in order to reduce functional  limitation Baseline: 10/10 at worst Goal status: MET  3.  Patient will demonstrate left knee extension to neutral to improve gait Baseline: lacking 5 deg 08/23/23: lacks 5 Goal status: INITIAL  LONG TERM GOALS: Target date: 09/20/2023  Patient will be I with final HEP to maintain progress from PT. Baseline: HEP provided at eval Goal status: INITIAL  2.  Patient will report LEFS >/= 75/80 in order to indicate improvement in functional status Baseline: 70/80 Goal status: INITIAL  3.  Patient will demonstrate left knee strength 5/5 MMT in order to improve walking tolerance Baseline: 4/5 MMT Goal status: INITIAL  4.  Patient will perform 5xSTS </= 12 seconds to indicate improvement in LE strength and reduce fall risk Baseline: 17 seconds Goal status: INITIAL  5.  Patient will perform >/= 1500 ft in order to improve community access Baseline: 1,110 ft Goal status: INITIAL  6.  Patient will report left knee pain </= 3/10 with extended periods of walking to reduce functional limitations Baseline: 10/10 pain Goal status: INITIAL   PLAN: PT FREQUENCY: 1-2x/week  PT DURATION: 8 weeks  PLANNED INTERVENTIONS: 97164- PT Re-evaluation, 97110-Therapeutic exercises, 97530- Therapeutic activity, 97112- Neuromuscular re-education, 97535- Self Care, [id number]- Manual therapy, [phone]- Gait training, [phone]- Aquatic Therapy, Patient/Family education, Balance training, Stair training, Taping, Dry Needling, Joint mobilization, Joint manipulation, Cryotherapy, and Moist heat  PLAN FOR NEXT SESSION: Review HEP and progress PRN, manual/stretching to improve left knee extension, progress left knee and hip strengthening, balance training   Jannette Spanner, PTA 08/23/23 2:45 PM Phone: [phone] Fax: [phone]

## 2023-08-27 ENCOUNTER — Ambulatory Visit

## 2023-08-27 DIAGNOSIS — R2689 Other abnormalities of gait and mobility: Secondary | ICD-10-CM | POA: Diagnosis not present

## 2023-08-27 DIAGNOSIS — M6281 Muscle weakness (generalized): Secondary | ICD-10-CM

## 2023-08-27 DIAGNOSIS — G8929 Other chronic pain: Secondary | ICD-10-CM | POA: Diagnosis not present

## 2023-08-27 DIAGNOSIS — M25562 Pain in left knee: Secondary | ICD-10-CM | POA: Diagnosis not present

## 2023-08-27 NOTE — Therapy (Signed)
 OUTPATIENT PHYSICAL THERAPY TREATMENT/PROGRESS NOTE  Progress Note Reporting Period 07/26/2023 to 08/27/2023  See note below for Objective Data and Assessment of Progress/Goals.      Patient Name: Kathy Duncan MRN: [id number] DOB:1955/12/31, 68 y.o., female Today's Date: 08/28/2023   END OF SESSION:  PT End of Session - 08/27/23 1615     Visit Number 10    Number of Visits 17    Date for PT Re-Evaluation 09/20/23    Authorization Type [employer] M[employer]    Authorization Time Period 07/30/2023 - 09/15/2023    Authorization - Visit Number 9    Authorization - Number of Visits 12    PT Start Time 1616    PT Stop Time 1655    PT Time Calculation (min) 39 min    Activity Tolerance Patient tolerated treatment well    Behavior During Therapy [facility] for tasks assessed/performed                   Past Medical History:  Diagnosis Date   Abnormal vaginal bleeding 07/29/2013   Anemia 07/12/2006   09/12/11: Anemia panel not suggestive of iron or B12 deficiency.  CBC with diff shows no pancytopenia.  Colonoscopy 2008 and referral in 2010 for heme positive stools not suggestive of need for further evaluation.  Most likely due to tamoxifen.    Arthritis    Diabetes mellitus    type 2   Ganglion cyst of wrist 10/14/2013   Hypertension    KNEE MENISCUS INJURY, UNSPECIFIED 07/12/2006   Qualifier: History of  By: Genia Kettering MD, Erin     Nuclear sclerotic cataract of both eyes 05/04/2020   Vitreomacular adhesion of left eye 05/04/2020   Vitreomacular adhesion of right eye 05/04/2020   Weight loss 07/09/2015   Past Surgical History:  Procedure Laterality Date   ABDOMINAL HYSTERECTOMY     has ovaries   BREAST EXCISIONAL BIOPSY Right 09/2009   TOTAL KNEE ARTHROPLASTY Right 03/01/2017   Procedure: RIGHT TOTAL KNEE ARTHROPLASTY;  Surgeon: Orvan Blanch, MD;  Location: [facility];  Service: Orthopedics;  Laterality: Right;  120 mins with abductor block   Patient Active Problem List    Diagnosis Date Noted   Left leg pain 06/27/2023   Vulval lesion 06/12/2023   Boil of buttock 10/17/2022   Nonimmune to hepatitis B virus 06/27/2022   Type 2 diabetes mellitus with other specified complication (HCC) 06/13/2022   Posterior vitreous detachment of both eyes 02/02/2022   Pseudophakia of both eyes 02/02/2022   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) 04/29/2021   Essential hypertension 11/01/2020   Coronary artery disease involving native coronary artery of native heart without angina pectoris 06/17/2020   Aortic atherosclerosis (HCC) 06/17/2020   Retinal hemorrhage of left eye 05/04/2020   Atypical ductal hyperplasia of breast 12/15/2010   Hyperlipidemia associated with type 2 diabetes mellitus (HCC) 12/28/2006   CARPAL TUNNEL SYNDROME, BILATERAL 12/28/2006   OBESITY, NOS 07/12/2006    PCP: Arn Lane, MD  REFERRING PROVIDER: Arn Lane, MD  REFERRING DIAG: Left leg pain  THERAPY DIAG:  Chronic pain of left knee  Muscle weakness (generalized)  Other abnormalities of gait and mobility  Rationale for Evaluation and Treatment: Rehabilitation  ONSET DATE: Chronic > 2 years   SUBJECTIVE:  SUBJECTIVE STATEMENT: Pt presents to PT with reports of no current pain, has been doing really well with HEP.   EVAL: Patient reports sometimes when she walks she gets a terrible pain around the  left knee and down the lower leg to the ankle. The pain has been going on for over 2 years but she didn't tell the doctor until it got so bad it affected her walking. Typically she can walk 1-15 minutes before it starts really aching but she just keeps walking. Also sometimes when she tries to relax at night she will get pain, and sometimes it will wake her up at night. She does have trouble going up and down stairs and she has to go up 13 at her apartment so holds on the the rail. She did get a knee replacement on the right side in 2018 and it is doing good.    PERTINENT HISTORY: See PMH above  PAIN:  Are you having pain? Yes:  NPRS scale: 0/10 currently, 10/10 at worst with walking Pain location: Left knee  Pain description: Aching Aggravating factors: Walking, stairs, nocturnal pain Relieving factors: Medication, creams  PRECAUTIONS: None  WEIGHT BEARING RESTRICTIONS: No   PATIENT GOALS: Pain relief with walking   OBJECTIVE:  Note: Objective measures were completed at Evaluation unless otherwise noted. PATIENT SURVEYS:  LEFS 70/80 08/27/2023: 78/80  MUSCLE LENGTH: Hamstring tightness  POSTURE:   Bilateral knee varus  PALPATION: Tender to palpation parapatellar region, medial joint line, anterior tibia  LOWER EXTREMITY ROM:  Active ROM Right eval Left eval Left 08/07/2023 Left 08/09/2023 Left 08/21/23  Hip flexion       Hip extension       Hip abduction       Hip adduction       Hip internal rotation       Hip external rotation       Knee flexion 92 105     Knee extension 0 Lacking 5 Lacking 5 Lacking 4 Lacking 2  Ankle dorsiflexion       Ankle plantarflexion       Ankle inversion       Ankle eversion        (Blank rows = not tested)  LOWER EXTREMITY MMT:  MMT Right eval Left eval  Hip flexion 4 4  Hip extension    Hip abduction 4- 3  Hip adduction    Hip internal rotation    Hip external rotation    Knee flexion    Knee extension 5 4  Ankle dorsiflexion 5 4  Ankle plantarflexion    Ankle inversion    Ankle eversion     (Blank rows = not tested)  FUNCTIONAL TESTS:  5 times sit to stand: 17 seconds  08/27/2023: 12 seconds 6 minute walk test: 1,110 ft  GAIT: Distance walked: 1,110 ft Assistive device utilized: None Level of assistance: Complete Independence Comments: Antalgic gait on left, compensated trendelenburg                                                                                                                                TREATMENT  [facility] Adult PT  Treatment:                                                 DATE: 08/27/2023 Therapeutic Exercise:  Supine QS with block L x 10 - 5" hold Supine SLR 2x10 2# S/L hip abd x 10 2# Slant board calf stretch 2x30 sec Standing hip abd/ext 2x10 30# Standing TKE with green powercord x 10 L Seated knee ext 2x10 10# SL knee ext x 10 5# eccentric L Therapeutic Activity: Assessment of tests/measures, goals, and outcomes for progress note STS 2x10 10# - low table NuStep L5 x 5 min with LE to improve LE endurance and workload capacity TRX squat 2x10 - B UE  [facility] Adult PT Treatment:                                                DATE: 08/23/23 Therapeutic Exercise: Passive knee flexion and ext  Supine SLR 3x10 2# Leg press 20# Left 10 x 2  Knee ext omega 5# right only x 10 Therapeutic Activity: Nustep L5 x 5 minutes  8 inch step up x15 L STS 3x10 10# - low table Standing TKE blue band x 15 left   Manual therapy: Medial patella creep Hooklying knee flexion and ext mobs  Supine QS Prone QS    [facility] Adult PT Treatment:                                                DATE: 08/21/2023 Therapeutic Exercise:  Slant board calf stretch 2x30 sec Standing TKE with green powercord x 10 L Supine QS with block L x 10 - 5" hold Supine SLR 3x10 2# Bridge with GTB 2x10 S/L clamshell 2x15 GTB LAQ 3x10 5# Therapeutic Activity: Step up 8in L lead 2x10 fwd - 1 UE STS 3x10 10# - low table NuStep L5 x 5 min with LE to improve LE endurance and workload capacity Functional squat 2x10 - B UE  [facility] Adult PT Treatment:                                                DATE: 08/16/2023 Heel raise 10 x 2  Slant board calf stretch 2x30 sec Supine SLR 2x10 left Supine QS with block L x 10 - 5" hold Supine h/s curls on ball 10 x 2  LAQ 3x10 5# Standing TKE with blue band  2x10 L  Hip abdct RTB 10 x 2 each  Standing hip ext 2x10 RTB Bridge 10 x  2 - feet over ball  Side hip abduction 10 x 2 left  Therapeutic Activity: Step up 6in L lead 2x10  fwd - 1 UE Lateral step down L 2 x 10 1 UE STS 3x10 5# - low chair  NuStep L5 x 5 min with LE to improve LE endurance and workload capacity Functional squat 2x10 - B UE   [facility] Adult PT Treatment:  DATE: 08/14/2023 Heel raise 10 x 2  Slant board calf stretch 2x30 sec Supine SLR 2x10 each 2# Supine QS with block L x 10 - 5" hold Hooklying clamshell 2x15 blue band LAQ 3x10 5# Standing TKE with ball 2x10 L  Hip abdct RTB 10 x 2 each  Standing hip ext 2x10 RTB Bridge 10 x  2  Side hip abduction 10 x 2 left  Therapeutic Activity: Step up 6in L lead 2x10 fwd - 1 UE STS 2x10 5# - low chair  NuStep L5 x 5 min with LE to improve LE endurance and workload capacity Functional squat 2x10 - B UE  PATIENT EDUCATION:  Education details: HEP update Person educated: Patient Education method: Explanation, Demonstration, Tactile cues, Verbal cues, and Handouts Education comprehension: verbalized understanding, returned demonstration, verbal cues required, tactile cues required, and needs further education  HOME EXERCISE PROGRAM: Access Code: [email or web address]   ASSESSMENT: CLINICAL IMPRESSION: Pt was able to complete all prescribed exercises with no adverse effect. Exercises today focused once again on improving bilateral quad and proximal hip strength with particular emphasis on L quad as well as improving functional movement with continued functional squats and transfers. Pt is progressing well with therapy thus far and today met her LEFS and 5xSTS goals for her progress note. No changes to HEP made today. Will continue to progress as able per POC.   EVAL: Patient is a 68 y.o. female who was seen today for physical therapy evaluation and treatment for chronic left knee and lower leg pain. She demonstrates limitations in her knee mobility, strength, and decreased tolerance for activities such as walking, stairs, and transfers that is impacting her functional  ability.  OBJECTIVE IMPAIRMENTS: Abnormal gait, decreased activity tolerance, decreased balance, decreased ROM, decreased strength, impaired flexibility, postural dysfunction, and pain.   ACTIVITY LIMITATIONS: standing, squatting, sleeping, stairs, transfers, and locomotion level  PARTICIPATION LIMITATIONS: meal prep, cleaning, shopping, and community activity  PERSONAL FACTORS: Fitness, Past/current experiences, and Time since onset of injury/illness/exacerbation are also affecting patient's functional outcome.    GOALS: Goals reviewed with patient? Yes  SHORT TERM GOALS: Target date: 08/23/2023  Patient will be I with initial HEP in order to progress with therapy. Baseline: HEP provided at eval Goal status: MET  2.  Patient will report left knee pain with activity </= 6/10 in order to reduce functional limitation Baseline: 10/10 at worst Goal status: MET  3.  Patient will demonstrate left knee extension to neutral to improve gait Baseline: lacking 5 deg 08/23/23: lacks 5 Goal status: ONGOING   LONG TERM GOALS: Target date: 09/20/2023  Patient will be I with final HEP to maintain progress from PT. Baseline: HEP provided at eval Goal status: INITIAL  2.  Patient will report LEFS >/= 75/80 in order to indicate improvement in functional status Baseline: 70/80 08/27/2023: 78/80 Goal status: MET  3.  Patient will demonstrate left knee strength 5/5 MMT in order to improve walking tolerance Baseline: 4/5 MMT Goal status: IN PROGRESS  4.  Patient will perform 5xSTS </= 12 seconds to indicate improvement in LE strength and reduce fall risk Baseline: 17 seconds 08/27/2023: 12 seconds Goal status: MET  5.  Patient will perform >/= 1500 ft in order to improve community access Baseline: 1,110 ft Goal status: IN PROGRESS  6.  Patient will report left knee pain </= 3/10 with extended periods of walking to reduce functional limitations Baseline: 10/10 pain Goal status: IN  PROGRESS   PLAN: PT FREQUENCY: 1-2x/week  PT DURATION: 8 weeks  PLANNED INTERVENTIONS: 97164- PT Re-evaluation, 97110-Therapeutic exercises, 97530- Therapeutic activity, 97112- Neuromuscular re-education, [phone]- Self Care, 60454- Manual therapy, [phone]- Gait training, [phone]- Aquatic Therapy, Patient/Family education, Balance training, Stair training, Taping, Dry Needling, Joint mobilization, Joint manipulation, Cryotherapy, and Moist heat  PLAN FOR NEXT SESSION: Review HEP and progress PRN, manual/stretching to improve left knee extension, progress left knee and hip strengthening, balance training   Ivor Mars PT  08/28/23 9:23 AM

## 2023-08-28 ENCOUNTER — Ambulatory Visit: Admitting: Student

## 2023-08-29 ENCOUNTER — Ambulatory Visit (INDEPENDENT_AMBULATORY_CARE_PROVIDER_SITE_OTHER): Admitting: Student

## 2023-08-29 ENCOUNTER — Ambulatory Visit

## 2023-08-29 VITALS — BP 131/76 | HR 88 | Temp 98.6°F | Ht 67.0 in | Wt 177.2 lb

## 2023-08-29 DIAGNOSIS — J029 Acute pharyngitis, unspecified: Secondary | ICD-10-CM | POA: Diagnosis not present

## 2023-08-29 DIAGNOSIS — J302 Other seasonal allergic rhinitis: Secondary | ICD-10-CM | POA: Diagnosis not present

## 2023-08-29 LAB — POCT RAPID STREP A (OFFICE): Rapid Strep A Screen: NEGATIVE

## 2023-08-29 MED ORDER — CETIRIZINE HCL 10 MG PO TABS: 10.0000 mg | ORAL_TABLET | Freq: Every day | ORAL | 0 refills | Status: AC

## 2023-08-29 NOTE — Patient Instructions (Signed)
 Pleasure to see you today.  Strep test today was negative.  Suspect your symptoms are more consistent with allergic rhinitis.  Sent in prescription for Zyrtec's which you take once daily for the next 10-14 days.  Continue to use your Flonase.  I have given you a sample of nasal saline which you could use to flush nose to help reduce the running nose.  If you have fevers or pain you could do Tylenol.

## 2023-08-29 NOTE — Progress Notes (Signed)
    SUBJECTIVE:   CHIEF COMPLAINT / HPI:   68 year old patient  presents with a sore throat and congestion, with a significant amount of mucus production. She also report itchy eyes and ears, which they have been managing with Flonase as recommended by an ear doctor. The patient denies fever, muscle aches and has not been around anyone known to be sick. She has been feeling this way since Monday and despite attempts to self-medicate, symptoms have worsened. Patient deny any other symptoms.  PERTINENT  PMH / PSH: Reviewed   OBJECTIVE:   BP 131/76   Pulse 88   Temp 98.6 F (37 C) (Oral)   Ht 5\' 7"  (1.702 m)   Wt 177 lb 4 oz (80.4 kg)   SpO2 100%   BMI 27.76 kg/m    Physical Exam General: Alert, well appearing, NAD HEENT: MMM, no oropharyngeal erythema or tonsillar exudate Cardiovascular: RRR, No Murmurs, Normal S2/S2 Respiratory: CTAB, No wheezing or Rales Abdomen: No distension or tenderness Extremities: No edema on extremities   Skin: Warm and dry  ASSESSMENT/PLAN:   Allergic Rhinitis Symptoms (epiphora, rhinorrhea, nasal congestion and oropharyngeal irritation) consistent with allergic rhinitis. Current Flonase use appropriate. - Continue Flonase as directed. - Initiate Zyrtec 10 mg once daily for 10 to 14 days. - Advise warm water with honey and lemon for throat irritation. - Provide nasal wash for additional symptom relief.  Goble Last, MD [facility]

## 2023-09-04 ENCOUNTER — Ambulatory Visit

## 2023-09-04 DIAGNOSIS — G8929 Other chronic pain: Secondary | ICD-10-CM

## 2023-09-04 DIAGNOSIS — M6281 Muscle weakness (generalized): Secondary | ICD-10-CM | POA: Diagnosis not present

## 2023-09-04 DIAGNOSIS — M25562 Pain in left knee: Secondary | ICD-10-CM | POA: Diagnosis not present

## 2023-09-04 DIAGNOSIS — R2689 Other abnormalities of gait and mobility: Secondary | ICD-10-CM | POA: Diagnosis not present

## 2023-09-04 NOTE — Therapy (Signed)
 OUTPATIENT PHYSICAL THERAPY TREATMENT     Patient Name: Kathy Duncan MRN: [id number] DOB:08-28-1955, 68 y.o., female Today's Date: 09/04/2023   END OF SESSION:  PT End of Session - 09/04/23 1447     Visit Number 11    Number of Visits 17    Date for PT Re-Evaluation 09/20/23    Authorization Type [employer] M[employer]    Authorization Time Period 07/30/2023 - 09/15/2023    Authorization - Visit Number 10    Authorization - Number of Visits 12    PT Start Time 1445    PT Stop Time 1524    PT Time Calculation (min) 39 min    Activity Tolerance Patient tolerated treatment well    Behavior During Therapy [facility] for tasks assessed/performed                    Past Medical History:  Diagnosis Date   Abnormal vaginal bleeding 07/29/2013   Anemia 07/12/2006   09/12/11: Anemia panel not suggestive of iron or B12 deficiency.  CBC with diff shows no pancytopenia.  Colonoscopy 2008 and referral in 2010 for heme positive stools not suggestive of need for further evaluation.  Most likely due to tamoxifen .    Arthritis    Diabetes mellitus    type 2   Ganglion cyst of wrist 10/14/2013   Hypertension    KNEE MENISCUS INJURY, UNSPECIFIED 07/12/2006   Qualifier: History of  By: Genia Kettering MD, Erin     Nuclear sclerotic cataract of both eyes 05/04/2020   Vitreomacular adhesion of left eye 05/04/2020   Vitreomacular adhesion of right eye 05/04/2020   Weight loss 07/09/2015   Past Surgical History:  Procedure Laterality Date   ABDOMINAL HYSTERECTOMY     has ovaries   BREAST EXCISIONAL BIOPSY Right 09/2009   TOTAL KNEE ARTHROPLASTY Right 03/01/2017   Procedure: RIGHT TOTAL KNEE ARTHROPLASTY;  Surgeon: Orvan Blanch, MD;  Location: [facility];  Service: Orthopedics;  Laterality: Right;  120 mins with abductor block   Patient Active Problem List   Diagnosis Date Noted   Left leg pain 06/27/2023   Vulval lesion 06/12/2023   Boil of buttock 10/17/2022   Nonimmune to hepatitis B virus  06/27/2022   Type 2 diabetes mellitus with other specified complication (HCC) 06/13/2022   Posterior vitreous detachment of both eyes 02/02/2022   Pseudophakia of both eyes 02/02/2022   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) 04/29/2021   Essential hypertension 11/01/2020   Coronary artery disease involving native coronary artery of native heart without angina pectoris 06/17/2020   Aortic atherosclerosis (HCC) 06/17/2020   Retinal hemorrhage of left eye 05/04/2020   Atypical ductal hyperplasia of breast 12/15/2010   Hyperlipidemia associated with type 2 diabetes mellitus (HCC) 12/28/2006   CARPAL TUNNEL SYNDROME, BILATERAL 12/28/2006   OBESITY, NOS 07/12/2006    PCP: Arn Lane, MD  REFERRING PROVIDER: Arn Lane, MD  REFERRING DIAG: Left leg pain  THERAPY DIAG:  Chronic pain of left knee  Muscle weakness (generalized)  Other abnormalities of gait and mobility  Rationale for Evaluation and Treatment: Rehabilitation  ONSET DATE: Chronic > 2 years   SUBJECTIVE:  SUBJECTIVE STATEMENT: Pt presents to PT with reports of no current pain, has been doing really well with HEP.   EVAL: Patient reports sometimes when she walks she gets a terrible pain around the left knee and down the lower leg to the ankle. The pain has been going on for over 2 years  but she didn't tell the doctor until it got so bad it affected her walking. Typically she can walk 1-15 minutes before it starts really aching but she just keeps walking. Also sometimes when she tries to relax at night she will get pain, and sometimes it will wake her up at night. She does have trouble going up and down stairs and she has to go up 13 at her apartment so holds on the the rail. She did get a knee replacement on the right side in 2018 and it is doing good.   PERTINENT HISTORY: See PMH above  PAIN:  Are you having pain? Yes:  NPRS scale: 0/10 currently, 10/10 at worst with walking Pain  location: Left knee  Pain description: Aching Aggravating factors: Walking, stairs, nocturnal pain Relieving factors: Medication, creams  PRECAUTIONS: None  WEIGHT BEARING RESTRICTIONS: No   PATIENT GOALS: Pain relief with walking   OBJECTIVE:  Note: Objective measures were completed at Evaluation unless otherwise noted. PATIENT SURVEYS:  LEFS 70/80 08/27/2023: 78/80  MUSCLE LENGTH: Hamstring tightness  POSTURE:   Bilateral knee varus  PALPATION: Tender to palpation parapatellar region, medial joint line, anterior tibia  LOWER EXTREMITY ROM:  Active ROM Right eval Left eval Left 08/07/2023 Left 08/09/2023 Left 08/21/23  Hip flexion       Hip extension       Hip abduction       Hip adduction       Hip internal rotation       Hip external rotation       Knee flexion 92 105     Knee extension 0 Lacking 5 Lacking 5 Lacking 4 Lacking 2  Ankle dorsiflexion       Ankle plantarflexion       Ankle inversion       Ankle eversion        (Blank rows = not tested)  LOWER EXTREMITY MMT:  MMT Right eval Left eval  Hip flexion 4 4  Hip extension    Hip abduction 4- 3  Hip adduction    Hip internal rotation    Hip external rotation    Knee flexion    Knee extension 5 4  Ankle dorsiflexion 5 4  Ankle plantarflexion    Ankle inversion    Ankle eversion     (Blank rows = not tested)  FUNCTIONAL TESTS:  5 times sit to stand: 17 seconds  08/27/2023: 12 seconds 6 minute walk test: 1,110 ft  GAIT: Distance walked: 1,110 ft Assistive device utilized: None Level of assistance: Complete Independence Comments: Antalgic gait on left, compensated trendelenburg                                                                                                                                TREATMENT  [facility] Adult PT Treatment:  DATE: 09/04/2023 Therapeutic Exercise:  Supine QS with block L x 10 - 5" hold Supine SLR 2x10 2# S/L  hip abd x 10 2# Slant board calf stretch 2x30 sec Standing hip abd/ext 2x10 30# Seated knee ext 2x10 10# SL knee ext x 10 5# eccentric L Seated knee flexion 2x10 20# Therapeutic Activity: STS 2x10 10# - low table Step ups 2x10 L stance 10in step NuStep L5 x 5 min with LE to improve LE endurance and workload capacity TRX squat 2x10 - B UE  [facility] Adult PT Treatment:                                                DATE: 08/27/2023 Therapeutic Exercise:  Supine QS with block L x 10 - 5" hold Supine SLR 2x10 2# S/L hip abd x 10 2# Slant board calf stretch 2x30 sec Standing hip abd/ext 2x10 30# Standing TKE with green powercord x 10 L Seated knee ext 2x10 10# SL knee ext x 10 5# eccentric L Therapeutic Activity: Assessment of tests/measures, goals, and outcomes for progress note STS 2x10 10# - low table NuStep L5 x 5 min with LE to improve LE endurance and workload capacity TRX squat 2x10 - B UE  [facility] Adult PT Treatment:                                                DATE: 08/23/23 Therapeutic Exercise: Passive knee flexion and ext  Supine SLR 3x10 2# Leg press 20# Left 10 x 2  Knee ext omega 5# right only x 10 Therapeutic Activity: Nustep L5 x 5 minutes  8 inch step up x15 L STS 3x10 10# - low table Standing TKE blue band x 15 left   Manual therapy: Medial patella creep Hooklying knee flexion and ext mobs  Supine QS Prone QS    [facility] Adult PT Treatment:                                                DATE: 08/21/2023 Therapeutic Exercise:  Slant board calf stretch 2x30 sec Standing TKE with green powercord x 10 L Supine QS with block L x 10 - 5" hold Supine SLR 3x10 2# Bridge with GTB 2x10 S/L clamshell 2x15 GTB LAQ 3x10 5# Therapeutic Activity: Step up 8in L lead 2x10 fwd - 1 UE STS 3x10 10# - low table NuStep L5 x 5 min with LE to improve LE endurance and workload capacity Functional squat 2x10 - B UE  [facility] Adult PT Treatment:                                                 DATE: 08/16/2023 Heel raise 10 x 2  Slant board calf stretch 2x30 sec Supine SLR 2x10 left Supine QS with block L x 10 - 5" hold Supine h/s curls on ball 10 x 2  LAQ 3x10 5# Standing TKE with blue band  2x10 L  Hip abdct RTB 10 x 2 each  Standing hip ext 2x10 RTB Bridge 10 x  2 - feet over ball  Side hip abduction 10 x 2 left  Therapeutic Activity: Step up 6in L lead 2x10 fwd - 1 UE Lateral step down L 2 x 10 1 UE STS 3x10 5# - low chair  NuStep L5 x 5 min with LE to improve LE endurance and workload capacity Functional squat 2x10 - B UE   [facility] Adult PT Treatment:                                                DATE: 08/14/2023 Heel raise 10 x 2  Slant board calf stretch 2x30 sec Supine SLR 2x10 each 2# Supine QS with block L x 10 - 5" hold Hooklying clamshell 2x15 blue band LAQ 3x10 5# Standing TKE with ball 2x10 L  Hip abdct RTB 10 x 2 each  Standing hip ext 2x10 RTB Bridge 10 x  2  Side hip abduction 10 x 2 left  Therapeutic Activity: Step up 6in L lead 2x10 fwd - 1 UE STS 2x10 5# - low chair  NuStep L5 x 5 min with LE to improve LE endurance and workload capacity Functional squat 2x10 - B UE  PATIENT EDUCATION:  Education details: HEP update Person educated: Patient Education method: Explanation, Demonstration, Tactile cues, Verbal cues, and Handouts Education comprehension: verbalized understanding, returned demonstration, verbal cues required, tactile cues required, and needs further education  HOME EXERCISE PROGRAM: Access Code: [email or web address]   ASSESSMENT: CLINICAL IMPRESSION: Pt was able to complete all prescribed exercises with no adverse effect. Exercises today focused once again on improving bilateral quad and proximal hip strength with particular emphasis on L quad as well as improving functional movement with continued functional squats and transfers. No changes to HEP made today. Will continue to progress as able per POC.   EVAL: Patient is a 68 y.o.  female who was seen today for physical therapy evaluation and treatment for chronic left knee and lower leg pain. She demonstrates limitations in her knee mobility, strength, and decreased tolerance for activities such as walking, stairs, and transfers that is impacting her functional ability.  OBJECTIVE IMPAIRMENTS: Abnormal gait, decreased activity tolerance, decreased balance, decreased ROM, decreased strength, impaired flexibility, postural dysfunction, and pain.   ACTIVITY LIMITATIONS: standing, squatting, sleeping, stairs, transfers, and locomotion level  PARTICIPATION LIMITATIONS: meal prep, cleaning, shopping, and community activity  PERSONAL FACTORS: Fitness, Past/current experiences, and Time since onset of injury/illness/exacerbation are also affecting patient's functional outcome.    GOALS: Goals reviewed with patient? Yes  SHORT TERM GOALS: Target date: 08/23/2023  Patient will be I with initial HEP in order to progress with therapy. Baseline: HEP provided at eval Goal status: MET  2.  Patient will report left knee pain with activity </= 6/10 in order to reduce functional limitation Baseline: 10/10 at worst Goal status: MET  3.  Patient will demonstrate left knee extension to neutral to improve gait Baseline: lacking 5 deg 08/23/23: lacks 5 Goal status: ONGOING   LONG TERM GOALS: Target date: 09/20/2023  Patient will be I with final HEP to maintain progress from PT. Baseline: HEP provided at eval Goal status: INITIAL  2.  Patient will report LEFS >/= 75/80 in order to indicate improvement in functional status Baseline: 70/80 08/27/2023: 78/80 Goal  status: MET  3.  Patient will demonstrate left knee strength 5/5 MMT in order to improve walking tolerance Baseline: 4/5 MMT Goal status: IN PROGRESS  4.  Patient will perform 5xSTS </= 12 seconds to indicate improvement in LE strength and reduce fall risk Baseline: 17 seconds 08/27/2023: 12 seconds Goal status:  MET  5.  Patient will perform >/= 1500 ft in order to improve community access Baseline: 1,110 ft Goal status: IN PROGRESS  6.  Patient will report left knee pain </= 3/10 with extended periods of walking to reduce functional limitations Baseline: 10/10 pain Goal status: IN PROGRESS   PLAN: PT FREQUENCY: 1-2x/week  PT DURATION: 8 weeks  PLANNED INTERVENTIONS: 97164- PT Re-evaluation, 97110-Therapeutic exercises, 97530- Therapeutic activity, 97112- Neuromuscular re-education, 97535- Self Care, [id number]- Manual therapy, [phone]- Gait training, [phone]- Aquatic Therapy, Patient/Family education, Balance training, Stair training, Taping, Dry Needling, Joint mobilization, Joint manipulation, Cryotherapy, and Moist heat  PLAN FOR NEXT SESSION: Review HEP and progress PRN, manual/stretching to improve left knee extension, progress left knee and hip strengthening, balance training   Ivor Mars PT  09/04/23 3:25 PM

## 2023-09-06 ENCOUNTER — Ambulatory Visit

## 2023-09-06 DIAGNOSIS — M25562 Pain in left knee: Secondary | ICD-10-CM | POA: Diagnosis not present

## 2023-09-06 DIAGNOSIS — M6281 Muscle weakness (generalized): Secondary | ICD-10-CM | POA: Diagnosis not present

## 2023-09-06 DIAGNOSIS — R2689 Other abnormalities of gait and mobility: Secondary | ICD-10-CM | POA: Diagnosis not present

## 2023-09-06 DIAGNOSIS — G8929 Other chronic pain: Secondary | ICD-10-CM | POA: Diagnosis not present

## 2023-09-06 NOTE — Therapy (Signed)
 OUTPATIENT PHYSICAL THERAPY TREATMENT     Patient Name: Kathy Duncan MRN: [id number] DOB:11-Jul-1955, 68 y.o., female Today's Date: 09/06/2023   END OF SESSION:  PT End of Session - 09/06/23 1059     Visit Number 12    Number of Visits 17    Date for PT Re-Evaluation 09/20/23    Authorization Type [employer] M[employer]    Authorization Time Period 07/30/2023 - 09/15/2023    Authorization - Number of Visits 12    PT Start Time 1100    PT Stop Time 1140    PT Time Calculation (min) 40 min    Activity Tolerance Patient tolerated treatment well    Behavior During Therapy [facility] for tasks assessed/performed                     Past Medical History:  Diagnosis Date   Abnormal vaginal bleeding 07/29/2013   Anemia 07/12/2006   09/12/11: Anemia panel not suggestive of iron or B12 deficiency.  CBC with diff shows no pancytopenia.  Colonoscopy 2008 and referral in 2010 for heme positive stools not suggestive of need for further evaluation.  Most likely due to tamoxifen .    Arthritis    Diabetes mellitus    type 2   Ganglion cyst of wrist 10/14/2013   Hypertension    KNEE MENISCUS INJURY, UNSPECIFIED 07/12/2006   Qualifier: History of  By: Genia Kettering MD, Erin     Nuclear sclerotic cataract of both eyes 05/04/2020   Vitreomacular adhesion of left eye 05/04/2020   Vitreomacular adhesion of right eye 05/04/2020   Weight loss 07/09/2015   Past Surgical History:  Procedure Laterality Date   ABDOMINAL HYSTERECTOMY     has ovaries   BREAST EXCISIONAL BIOPSY Right 09/2009   TOTAL KNEE ARTHROPLASTY Right 03/01/2017   Procedure: RIGHT TOTAL KNEE ARTHROPLASTY;  Surgeon: Orvan Blanch, MD;  Location: [facility];  Service: Orthopedics;  Laterality: Right;  120 mins with abductor block   Patient Active Problem List   Diagnosis Date Noted   Left leg pain 06/27/2023   Vulval lesion 06/12/2023   Boil of buttock 10/17/2022   Nonimmune to hepatitis B virus 06/27/2022   Type 2 diabetes mellitus  with other specified complication (HCC) 06/13/2022   Posterior vitreous detachment of both eyes 02/02/2022   Pseudophakia of both eyes 02/02/2022   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) 04/29/2021   Essential hypertension 11/01/2020   Coronary artery disease involving native coronary artery of native heart without angina pectoris 06/17/2020   Aortic atherosclerosis (HCC) 06/17/2020   Retinal hemorrhage of left eye 05/04/2020   Atypical ductal hyperplasia of breast 12/15/2010   Hyperlipidemia associated with type 2 diabetes mellitus (HCC) 12/28/2006   CARPAL TUNNEL SYNDROME, BILATERAL 12/28/2006   OBESITY, NOS 07/12/2006    PCP: Arn Lane, MD  REFERRING PROVIDER: Arn Lane, MD  REFERRING DIAG: Left leg pain  THERAPY DIAG:  Chronic pain of left knee  Muscle weakness (generalized)  Rationale for Evaluation and Treatment: Rehabilitation  ONSET DATE: Chronic > 2 years   SUBJECTIVE:  SUBJECTIVE STATEMENT: Pt presents to PT with continued reports of improvement. Has been compliant with HEP.   EVAL: Patient reports sometimes when she walks she gets a terrible pain around the left knee and down the lower leg to the ankle. The pain has been going on for over 2 years but she didn't tell the doctor until it got so bad it affected her walking. Typically she  can walk 1-15 minutes before it starts really aching but she just keeps walking. Also sometimes when she tries to relax at night she will get pain, and sometimes it will wake her up at night. She does have trouble going up and down stairs and she has to go up 13 at her apartment so holds on the the rail. She did get a knee replacement on the right side in 2018 and it is doing good.   PERTINENT HISTORY: See PMH above  PAIN:  Are you having pain? Yes:  NPRS scale: 0/10 currently, 10/10 at worst with walking Pain location: Left knee  Pain description: Aching Aggravating factors: Walking, stairs,  nocturnal pain Relieving factors: Medication, creams  PRECAUTIONS: None  WEIGHT BEARING RESTRICTIONS: No   PATIENT GOALS: Pain relief with walking   OBJECTIVE:  Note: Objective measures were completed at Evaluation unless otherwise noted. PATIENT SURVEYS:  LEFS 70/80 08/27/2023: 78/80  MUSCLE LENGTH: Hamstring tightness  POSTURE:   Bilateral knee varus  PALPATION: Tender to palpation parapatellar region, medial joint line, anterior tibia  LOWER EXTREMITY ROM:  Active ROM Right eval Left eval Left 08/07/2023 Left 08/09/2023 Left 08/21/23  Hip flexion       Hip extension       Hip abduction       Hip adduction       Hip internal rotation       Hip external rotation       Knee flexion 92 105     Knee extension 0 Lacking 5 Lacking 5 Lacking 4 Lacking 2  Ankle dorsiflexion       Ankle plantarflexion       Ankle inversion       Ankle eversion        (Blank rows = not tested)  LOWER EXTREMITY MMT:  MMT Right eval Left eval  Hip flexion 4 4  Hip extension    Hip abduction 4- 3  Hip adduction    Hip internal rotation    Hip external rotation    Knee flexion    Knee extension 5 4  Ankle dorsiflexion 5 4  Ankle plantarflexion    Ankle inversion    Ankle eversion     (Blank rows = not tested)  FUNCTIONAL TESTS:  5 times sit to stand: 17 seconds  08/27/2023: 12 seconds 6 minute walk test: 1,110 ft  GAIT: Distance walked: 1,110 ft Assistive device utilized: None Level of assistance: Complete Independence Comments: Antalgic gait on left, compensated trendelenburg                                                                                                                                TREATMENT  [facility] Adult PT Treatment:  DATE: 09/06/2023 Therapeutic Exercise:  Seated knee ext 2x10 15# Seated knee flexion 2x10 25# Standing hip abd/ext 2x10 30# Omega leg pres 3x10 65# Supine QS with block L x 10 - 5"  hold Supine SLR 2x10 2.5# Bridge with GTB 2x15 Hooklying clamshell 2x15 GTB Slant board calf stretch 2x30 sec Therapeutic Activity: STS 2x10 10# - low table NuStep L5 x 5 min with LE to improve LE endurance and workload capacity TRX squat 2x10 - B UE  [facility] Adult PT Treatment:                                                DATE: 09/04/2023 Therapeutic Exercise:  Supine QS with block L x 10 - 5" hold Supine SLR 2x10 2# S/L hip abd x 10 2# Slant board calf stretch 2x30 sec Standing hip abd/ext 2x10 30# Seated knee ext 2x10 10# SL knee ext x 10 5# eccentric L Seated knee flexion 2x10 20# Therapeutic Activity: STS 2x10 10# - low table Step ups 2x10 L stance 10in step NuStep L5 x 5 min with LE to improve LE endurance and workload capacity TRX squat 2x10 - B UE  [facility] Adult PT Treatment:                                                DATE: 08/27/2023 Therapeutic Exercise:  Supine QS with block L x 10 - 5" hold Supine SLR 2x10 2# S/L hip abd x 10 2# Slant board calf stretch 2x30 sec Standing hip abd/ext 2x10 30# Standing TKE with green powercord x 10 L Seated knee ext 2x10 10# SL knee ext x 10 5# eccentric L Therapeutic Activity: Assessment of tests/measures, goals, and outcomes for progress note STS 2x10 10# - low table NuStep L5 x 5 min with LE to improve LE endurance and workload capacity TRX squat 2x10 - B UE  [facility] Adult PT Treatment:                                                DATE: 08/23/23 Therapeutic Exercise: Passive knee flexion and ext  Supine SLR 3x10 2# Leg press 20# Left 10 x 2  Knee ext omega 5# right only x 10 Therapeutic Activity: Nustep L5 x 5 minutes  8 inch step up x15 L STS 3x10 10# - low table Standing TKE blue band x 15 left   Manual therapy: Medial patella creep Hooklying knee flexion and ext mobs  Supine QS Prone QS    [facility] Adult PT Treatment:                                                DATE: 08/21/2023 Therapeutic Exercise:  Slant board  calf stretch 2x30 sec Standing TKE with green powercord x 10 L Supine QS with block L x 10 - 5" hold Supine SLR 3x10 2# Bridge with GTB 2x10 S/L clamshell 2x15 GTB LAQ 3x10 5# Therapeutic Activity: Step up 8in  L lead 2x10 fwd - 1 UE STS 3x10 10# - low table NuStep L5 x 5 min with LE to improve LE endurance and workload capacity Functional squat 2x10 - B UE  [facility] Adult PT Treatment:                                                DATE: 08/16/2023 Heel raise 10 x 2  Slant board calf stretch 2x30 sec Supine SLR 2x10 left Supine QS with block L x 10 - 5" hold Supine h/s curls on ball 10 x 2  LAQ 3x10 5# Standing TKE with blue band  2x10 L  Hip abdct RTB 10 x 2 each  Standing hip ext 2x10 RTB Bridge 10 x  2 - feet over ball  Side hip abduction 10 x 2 left  Therapeutic Activity: Step up 6in L lead 2x10 fwd - 1 UE Lateral step down L 2 x 10 1 UE STS 3x10 5# - low chair  NuStep L5 x 5 min with LE to improve LE endurance and workload capacity Functional squat 2x10 - B UE   [facility] Adult PT Treatment:                                                DATE: 08/14/2023 Heel raise 10 x 2  Slant board calf stretch 2x30 sec Supine SLR 2x10 each 2# Supine QS with block L x 10 - 5" hold Hooklying clamshell 2x15 blue band LAQ 3x10 5# Standing TKE with ball 2x10 L  Hip abdct RTB 10 x 2 each  Standing hip ext 2x10 RTB Bridge 10 x  2  Side hip abduction 10 x 2 left  Therapeutic Activity: Step up 6in L lead 2x10 fwd - 1 UE STS 2x10 5# - low chair  NuStep L5 x 5 min with LE to improve LE endurance and workload capacity Functional squat 2x10 - B UE  PATIENT EDUCATION:  Education details: HEP update Person educated: Patient Education method: Explanation, Demonstration, Tactile cues, Verbal cues, and Handouts Education comprehension: verbalized understanding, returned demonstration, verbal cues required, tactile cues required, and needs further education  HOME EXERCISE PROGRAM: Access Code:  [email or web address]   ASSESSMENT: CLINICAL IMPRESSION: Pt was able to complete all prescribed exercises with no adverse effect. Exercises today focused once again on improving bilateral quad and proximal hip strength with particular emphasis on L quad as well as improving functional movement with continued functional squats and transfers. No changes to HEP made today. Will continue to progress as able per POC.   EVAL: Patient is a 68 y.o. female who was seen today for physical therapy evaluation and treatment for chronic left knee and lower leg pain. She demonstrates limitations in her knee mobility, strength, and decreased tolerance for activities such as walking, stairs, and transfers that is impacting her functional ability.  OBJECTIVE IMPAIRMENTS: Abnormal gait, decreased activity tolerance, decreased balance, decreased ROM, decreased strength, impaired flexibility, postural dysfunction, and pain.   ACTIVITY LIMITATIONS: standing, squatting, sleeping, stairs, transfers, and locomotion level  PARTICIPATION LIMITATIONS: meal prep, cleaning, shopping, and community activity  PERSONAL FACTORS: Fitness, Past/current experiences, and Time since onset of injury/illness/exacerbation are also affecting patient's functional outcome.    GOALS: Goals  reviewed with patient? Yes  SHORT TERM GOALS: Target date: 08/23/2023  Patient will be I with initial HEP in order to progress with therapy. Baseline: HEP provided at eval Goal status: MET  2.  Patient will report left knee pain with activity </= 6/10 in order to reduce functional limitation Baseline: 10/10 at worst Goal status: MET  3.  Patient will demonstrate left knee extension to neutral to improve gait Baseline: lacking 5 deg 08/23/23: lacks 5 Goal status: ONGOING   LONG TERM GOALS: Target date: 09/20/2023  Patient will be I with final HEP to maintain progress from PT. Baseline: HEP provided at eval Goal status: INITIAL  2.  Patient will report  LEFS >/= 75/80 in order to indicate improvement in functional status Baseline: 70/80 08/27/2023: 78/80 Goal status: MET  3.  Patient will demonstrate left knee strength 5/5 MMT in order to improve walking tolerance Baseline: 4/5 MMT Goal status: IN PROGRESS  4.  Patient will perform 5xSTS </= 12 seconds to indicate improvement in LE strength and reduce fall risk Baseline: 17 seconds 08/27/2023: 12 seconds Goal status: MET  5.  Patient will perform >/= 1500 ft in order to improve community access Baseline: 1,110 ft Goal status: IN PROGRESS  6.  Patient will report left knee pain </= 3/10 with extended periods of walking to reduce functional limitations Baseline: 10/10 pain Goal status: IN PROGRESS   PLAN: PT FREQUENCY: 1-2x/week  PT DURATION: 8 weeks  PLANNED INTERVENTIONS: 97164- PT Re-evaluation, 97110-Therapeutic exercises, 97530- Therapeutic activity, 97112- Neuromuscular re-education, 97535- Self Care, 96045- Manual therapy, [phone]- Gait training, [phone]- Aquatic Therapy, Patient/Family education, Balance training, Stair training, Taping, Dry Needling, Joint mobilization, Joint manipulation, Cryotherapy, and Moist heat  PLAN FOR NEXT SESSION: Review HEP and progress PRN, manual/stretching to improve left knee extension, progress left knee and hip strengthening, balance training   Ivor Mars PT  09/06/23 11:40 AM

## 2023-09-11 ENCOUNTER — Ambulatory Visit

## 2023-09-11 DIAGNOSIS — M6281 Muscle weakness (generalized): Secondary | ICD-10-CM

## 2023-09-11 DIAGNOSIS — R2689 Other abnormalities of gait and mobility: Secondary | ICD-10-CM

## 2023-09-11 DIAGNOSIS — G8929 Other chronic pain: Secondary | ICD-10-CM | POA: Diagnosis not present

## 2023-09-11 DIAGNOSIS — M25562 Pain in left knee: Secondary | ICD-10-CM | POA: Diagnosis not present

## 2023-09-11 NOTE — Therapy (Signed)
 OUTPATIENT PHYSICAL THERAPY TREATMENT     Patient Name: Kathy Duncan MRN: [id number] DOB:1956/04/23, 68 y.o., female Today's Date: 09/11/2023   END OF SESSION:  PT End of Session - 09/11/23 1540     Visit Number 13    Number of Visits 17    Date for PT Re-Evaluation 09/20/23    Authorization Type [employer] M[employer]    Authorization Time Period 07/30/2023 - 09/15/2023    Authorization - Visit Number 11    Authorization - Number of Visits 12    PT Start Time 1530    Activity Tolerance Patient tolerated treatment well    Behavior During Therapy [facility] for tasks assessed/performed                      Past Medical History:  Diagnosis Date   Abnormal vaginal bleeding 07/29/2013   Anemia 07/12/2006   09/12/11: Anemia panel not suggestive of iron or B12 deficiency.  CBC with diff shows no pancytopenia.  Colonoscopy 2008 and referral in 2010 for heme positive stools not suggestive of need for further evaluation.  Most likely due to tamoxifen .    Arthritis    Diabetes mellitus    type 2   Ganglion cyst of wrist 10/14/2013   Hypertension    KNEE MENISCUS INJURY, UNSPECIFIED 07/12/2006   Qualifier: History of  By: Genia Kettering MD, Erin     Nuclear sclerotic cataract of both eyes 05/04/2020   Vitreomacular adhesion of left eye 05/04/2020   Vitreomacular adhesion of right eye 05/04/2020   Weight loss 07/09/2015   Past Surgical History:  Procedure Laterality Date   ABDOMINAL HYSTERECTOMY     has ovaries   BREAST EXCISIONAL BIOPSY Right 09/2009   TOTAL KNEE ARTHROPLASTY Right 03/01/2017   Procedure: RIGHT TOTAL KNEE ARTHROPLASTY;  Surgeon: Orvan Blanch, MD;  Location: [facility];  Service: Orthopedics;  Laterality: Right;  120 mins with abductor block   Patient Active Problem List   Diagnosis Date Noted   Left leg pain 06/27/2023   Vulval lesion 06/12/2023   Boil of buttock 10/17/2022   Nonimmune to hepatitis B virus 06/27/2022   Type 2 diabetes mellitus with other specified  complication (HCC) 06/13/2022   Posterior vitreous detachment of both eyes 02/02/2022   Pseudophakia of both eyes 02/02/2022   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) 04/29/2021   Essential hypertension 11/01/2020   Coronary artery disease involving native coronary artery of native heart without angina pectoris 06/17/2020   Aortic atherosclerosis (HCC) 06/17/2020   Retinal hemorrhage of left eye 05/04/2020   Atypical ductal hyperplasia of breast 12/15/2010   Hyperlipidemia associated with type 2 diabetes mellitus (HCC) 12/28/2006   CARPAL TUNNEL SYNDROME, BILATERAL 12/28/2006   OBESITY, NOS 07/12/2006    PCP: Arn Lane, MD  REFERRING PROVIDER: Arn Lane, MD  REFERRING DIAG: Left leg pain  THERAPY DIAG:  Chronic pain of left knee  Muscle weakness (generalized)  Other abnormalities of gait and mobility  Rationale for Evaluation and Treatment: Rehabilitation  ONSET DATE: Chronic > 2 years   SUBJECTIVE:  SUBJECTIVE STATEMENT: Pt presents to PT with continued improvement. Has been compliant with HEP.   EVAL: Patient reports sometimes when she walks she gets a terrible pain around the left knee and down the lower leg to the ankle. The pain has been going on for over 2 years but she didn't tell the doctor until it got so bad it affected her walking. Typically she can walk  1-15 minutes before it starts really aching but she just keeps walking. Also sometimes when she tries to relax at night she will get pain, and sometimes it will wake her up at night. She does have trouble going up and down stairs and she has to go up 13 at her apartment so holds on the the rail. She did get a knee replacement on the right side in 2018 and it is doing good.   PERTINENT HISTORY: See PMH above  PAIN:  Are you having pain? Yes:  NPRS scale: 0/10 currently, 10/10 at worst with walking Pain location: Left knee  Pain description: Aching Aggravating factors:  Walking, stairs, nocturnal pain Relieving factors: Medication, creams  PRECAUTIONS: None  WEIGHT BEARING RESTRICTIONS: No   PATIENT GOALS: Pain relief with walking   OBJECTIVE:  Note: Objective measures were completed at Evaluation unless otherwise noted. PATIENT SURVEYS:  LEFS 70/80 08/27/2023: 78/80  MUSCLE LENGTH: Hamstring tightness  POSTURE:   Bilateral knee varus  PALPATION: Tender to palpation parapatellar region, medial joint line, anterior tibia  LOWER EXTREMITY ROM:  Active ROM Right eval Left eval Left 08/07/2023 Left 08/09/2023 Left 08/21/23  Hip flexion       Hip extension       Hip abduction       Hip adduction       Hip internal rotation       Hip external rotation       Knee flexion 92 105     Knee extension 0 Lacking 5 Lacking 5 Lacking 4 Lacking 2  Ankle dorsiflexion       Ankle plantarflexion       Ankle inversion       Ankle eversion        (Blank rows = not tested)  LOWER EXTREMITY MMT:  MMT Right eval Left eval  Hip flexion 4 4  Hip extension    Hip abduction 4- 3  Hip adduction    Hip internal rotation    Hip external rotation    Knee flexion    Knee extension 5 4  Ankle dorsiflexion 5 4  Ankle plantarflexion    Ankle inversion    Ankle eversion     (Blank rows = not tested)  FUNCTIONAL TESTS:  5 times sit to stand: 17 seconds  08/27/2023: 12 seconds 6 minute walk test: 1,110 ft  GAIT: Distance walked: 1,110 ft Assistive device utilized: None Level of assistance: Complete Independence Comments: Antalgic gait on left, compensated trendelenburg                                                                                                                                TREATMENT  [facility] Adult PT Treatment:  DATE: 09/11/2023 Therapeutic Exercise:  Seated knee ext 2x10 15# Seated knee flexion 2x15 25# Lateral walk GTB x 3 laps at counter Standing hip abd/ext 2x10 30# Omega leg  pres 3x10 75# Supine QS with block L x 10 - 5" hold Supine SLR 2x10 2# S/L hip abd 2x10 2# Slant board calf stretch 2x30 sec Therapeutic Activity: STS 2x10 10# - low table NuStep L5 x 5 min with LE to improve LE endurance and workload capacity TRX squat 2x10 - B UE  [facility] Adult PT Treatment:                                                DATE: 09/06/2023 Therapeutic Exercise:  Seated knee ext 2x10 15# Seated knee flexion 2x10 25# Standing hip abd/ext 2x10 30# Omega leg pres 3x10 65# Supine QS with block L x 10 - 5" hold Supine SLR 2x10 2.5# Bridge with GTB 2x15 Hooklying clamshell 2x15 GTB Slant board calf stretch 2x30 sec Therapeutic Activity: STS 2x10 10# - low table NuStep L5 x 5 min with LE to improve LE endurance and workload capacity TRX squat 2x10 - B UE  [facility] Adult PT Treatment:                                                DATE: 09/04/2023 Therapeutic Exercise:  Supine QS with block L x 10 - 5" hold Supine SLR 2x10 2# S/L hip abd x 10 2# Slant board calf stretch 2x30 sec Standing hip abd/ext 2x10 30# Seated knee ext 2x10 10# SL knee ext x 10 5# eccentric L Seated knee flexion 2x10 20# Therapeutic Activity: STS 2x10 10# - low table Step ups 2x10 L stance 10in step NuStep L5 x 5 min with LE to improve LE endurance and workload capacity TRX squat 2x10 - B UE  [facility] Adult PT Treatment:                                                DATE: 08/27/2023 Therapeutic Exercise:  Supine QS with block L x 10 - 5" hold Supine SLR 2x10 2# S/L hip abd x 10 2# Slant board calf stretch 2x30 sec Standing hip abd/ext 2x10 30# Standing TKE with green powercord x 10 L Seated knee ext 2x10 10# SL knee ext x 10 5# eccentric L Therapeutic Activity: Assessment of tests/measures, goals, and outcomes for progress note STS 2x10 10# - low table NuStep L5 x 5 min with LE to improve LE endurance and workload capacity TRX squat 2x10 - B UE  [facility] Adult PT Treatment:                                                 DATE: 08/23/23 Therapeutic Exercise: Passive knee flexion and ext  Supine SLR 3x10 2# Leg press 20# Left 10 x 2  Knee ext omega 5# right only x 10 Therapeutic Activity: Nustep L5 x 5 minutes  8 inch  step up x15 L STS 3x10 10# - low table Standing TKE blue band x 15 left   Manual therapy: Medial patella creep Hooklying knee flexion and ext mobs  Supine QS Prone QS    [facility] Adult PT Treatment:                                                DATE: 08/21/2023 Therapeutic Exercise:  Slant board calf stretch 2x30 sec Standing TKE with green powercord x 10 L Supine QS with block L x 10 - 5" hold Supine SLR 3x10 2# Bridge with GTB 2x10 S/L clamshell 2x15 GTB LAQ 3x10 5# Therapeutic Activity: Step up 8in L lead 2x10 fwd - 1 UE STS 3x10 10# - low table NuStep L5 x 5 min with LE to improve LE endurance and workload capacity Functional squat 2x10 - B UE  [facility] Adult PT Treatment:                                                DATE: 08/16/2023 Heel raise 10 x 2  Slant board calf stretch 2x30 sec Supine SLR 2x10 left Supine QS with block L x 10 - 5" hold Supine h/s curls on ball 10 x 2  LAQ 3x10 5# Standing TKE with blue band  2x10 L  Hip abdct RTB 10 x 2 each  Standing hip ext 2x10 RTB Bridge 10 x  2 - feet over ball  Side hip abduction 10 x 2 left  Therapeutic Activity: Step up 6in L lead 2x10 fwd - 1 UE Lateral step down L 2 x 10 1 UE STS 3x10 5# - low chair  NuStep L5 x 5 min with LE to improve LE endurance and workload capacity Functional squat 2x10 - B UE   [facility] Adult PT Treatment:                                                DATE: 08/14/2023 Heel raise 10 x 2  Slant board calf stretch 2x30 sec Supine SLR 2x10 each 2# Supine QS with block L x 10 - 5" hold Hooklying clamshell 2x15 blue band LAQ 3x10 5# Standing TKE with ball 2x10 L  Hip abdct RTB 10 x 2 each  Standing hip ext 2x10 RTB Bridge 10 x  2  Side hip abduction 10 x 2 left  Therapeutic  Activity: Step up 6in L lead 2x10 fwd - 1 UE STS 2x10 5# - low chair  NuStep L5 x 5 min with LE to improve LE endurance and workload capacity Functional squat 2x10 - B UE  PATIENT EDUCATION:  Education details: HEP update Person educated: Patient Education method: Explanation, Demonstration, Tactile cues, Verbal cues, and Handouts Education comprehension: verbalized understanding, returned demonstration, verbal cues required, tactile cues required, and needs further education  HOME EXERCISE PROGRAM: Access Code: [email or web address]   ASSESSMENT: CLINICAL IMPRESSION: Pt was able to complete all prescribed exercises with no adverse effect. Exercises today focused once again on improving bilateral quad and proximal hip strength with particular emphasis on L quad as well as improving functional  movement with continued functional squats and transfers. No changes to HEP made today. Will assess goals and discharge readiness at next session.   EVAL: Patient is a 68 y.o. female who was seen today for physical therapy evaluation and treatment for chronic left knee and lower leg pain. She demonstrates limitations in her knee mobility, strength, and decreased tolerance for activities such as walking, stairs, and transfers that is impacting her functional ability.  OBJECTIVE IMPAIRMENTS: Abnormal gait, decreased activity tolerance, decreased balance, decreased ROM, decreased strength, impaired flexibility, postural dysfunction, and pain.   ACTIVITY LIMITATIONS: standing, squatting, sleeping, stairs, transfers, and locomotion level  PARTICIPATION LIMITATIONS: meal prep, cleaning, shopping, and community activity  PERSONAL FACTORS: Fitness, Past/current experiences, and Time since onset of injury/illness/exacerbation are also affecting patient's functional outcome.    GOALS: Goals reviewed with patient? Yes  SHORT TERM GOALS: Target date: 08/23/2023  Patient will be I with initial HEP in order to progress  with therapy. Baseline: HEP provided at eval Goal status: MET  2.  Patient will report left knee pain with activity </= 6/10 in order to reduce functional limitation Baseline: 10/10 at worst Goal status: MET  3.  Patient will demonstrate left knee extension to neutral to improve gait Baseline: lacking 5 deg 08/23/23: lacks 5 Goal status: ONGOING   LONG TERM GOALS: Target date: 09/20/2023  Patient will be I with final HEP to maintain progress from PT. Baseline: HEP provided at eval Goal status: INITIAL  2.  Patient will report LEFS >/= 75/80 in order to indicate improvement in functional status Baseline: 70/80 08/27/2023: 78/80 Goal status: MET  3.  Patient will demonstrate left knee strength 5/5 MMT in order to improve walking tolerance Baseline: 4/5 MMT Goal status: IN PROGRESS  4.  Patient will perform 5xSTS </= 12 seconds to indicate improvement in LE strength and reduce fall risk Baseline: 17 seconds 08/27/2023: 12 seconds Goal status: MET  5.  Patient will perform >/= 1500 ft in order to improve community access Baseline: 1,110 ft Goal status: IN PROGRESS  6.  Patient will report left knee pain </= 3/10 with extended periods of walking to reduce functional limitations Baseline: 10/10 pain Goal status: IN PROGRESS   PLAN: PT FREQUENCY: 1-2x/week  PT DURATION: 8 weeks  PLANNED INTERVENTIONS: 97164- PT Re-evaluation, 97110-Therapeutic exercises, 97530- Therapeutic activity, 97112- Neuromuscular re-education, 97535- Self Care, [id number]- Manual therapy, [phone]- Gait training, [phone]- Aquatic Therapy, Patient/Family education, Balance training, Stair training, Taping, Dry Needling, Joint mobilization, Joint manipulation, Cryotherapy, and Moist heat  PLAN FOR NEXT SESSION: Review HEP and progress PRN, manual/stretching to improve left knee extension, progress left knee and hip strengthening, balance training   Ivor Mars PT  09/11/23 3:41 PM

## 2023-09-13 ENCOUNTER — Ambulatory Visit: Attending: Family Medicine

## 2023-09-13 DIAGNOSIS — M6281 Muscle weakness (generalized): Secondary | ICD-10-CM | POA: Diagnosis not present

## 2023-09-13 DIAGNOSIS — M25562 Pain in left knee: Secondary | ICD-10-CM | POA: Diagnosis not present

## 2023-09-13 DIAGNOSIS — G8929 Other chronic pain: Secondary | ICD-10-CM | POA: Insufficient documentation

## 2023-09-13 NOTE — Therapy (Signed)
 OUTPATIENT PHYSICAL THERAPY TREATMENT/DISCHARGE  PHYSICAL THERAPY DISCHARGE SUMMARY  Visits from Start of Care: 14  Current functional level related to goals / functional outcomes: See goals and objective   Remaining deficits: See goals and objective   Education / Equipment: HEP   Patient agrees to discharge. Patient goals were met. Patient is being discharged due to meeting the stated rehab goals.    Patient Name: Kathy Duncan MRN: [id number] DOB:02/11/1956, 68 y.o., female Today's Date: 09/14/2023   END OF SESSION:  PT End of Session - 09/13/23 1513     Visit Number 14    Number of Visits 17    Date for PT Re-Evaluation 09/20/23    Authorization Type [employer]    Authorization Time Period 07/30/2023 - 09/15/2023    Authorization - Visit Number --    Authorization - Number of Visits --    PT Start Time 1530    PT Stop Time 1608    PT Time Calculation (min) 38 min    Activity Tolerance Patient tolerated treatment well    Behavior During Therapy [facility] for tasks assessed/performed                       Past Medical History:  Diagnosis Date   Abnormal vaginal bleeding 07/29/2013   Anemia 07/12/2006   09/12/11: Anemia panel not suggestive of iron or B12 deficiency.  CBC with diff shows no pancytopenia.  Colonoscopy 2008 and referral in 2010 for heme positive stools not suggestive of need for further evaluation.  Most likely due to tamoxifen .    Arthritis    Diabetes mellitus    type 2   Ganglion cyst of wrist 10/14/2013   Hypertension    KNEE MENISCUS INJURY, UNSPECIFIED 07/12/2006   Qualifier: History of  By: Genia Kettering MD, Erin     Nuclear sclerotic cataract of both eyes 05/04/2020   Vitreomacular adhesion of left eye 05/04/2020   Vitreomacular adhesion of right eye 05/04/2020   Weight loss 07/09/2015   Past Surgical History:  Procedure Laterality Date   ABDOMINAL HYSTERECTOMY     has ovaries   BREAST EXCISIONAL BIOPSY Right 09/2009   TOTAL KNEE  ARTHROPLASTY Right 03/01/2017   Procedure: RIGHT TOTAL KNEE ARTHROPLASTY;  Surgeon: Orvan Blanch, MD;  Location: [facility];  Service: Orthopedics;  Laterality: Right;  120 mins with abductor block   Patient Active Problem List   Diagnosis Date Noted   Left leg pain 06/27/2023   Vulval lesion 06/12/2023   Boil of buttock 10/17/2022   Nonimmune to hepatitis B virus 06/27/2022   Type 2 diabetes mellitus with other specified complication (HCC) 06/13/2022   Posterior vitreous detachment of both eyes 02/02/2022   Pseudophakia of both eyes 02/02/2022   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) 04/29/2021   Essential hypertension 11/01/2020   Coronary artery disease involving native coronary artery of native heart without angina pectoris 06/17/2020   Aortic atherosclerosis (HCC) 06/17/2020   Retinal hemorrhage of left eye 05/04/2020   Atypical ductal hyperplasia of breast 12/15/2010   Hyperlipidemia associated with type 2 diabetes mellitus (HCC) 12/28/2006   CARPAL TUNNEL SYNDROME, BILATERAL 12/28/2006   OBESITY, NOS 07/12/2006    PCP: Arn Lane, MD  REFERRING PROVIDER: Arn Lane, MD  REFERRING DIAG: Left leg pain  THERAPY DIAG:  Chronic pain of left knee  Muscle weakness (generalized)  Rationale for Evaluation and Treatment: Rehabilitation  ONSET DATE: Chronic > 2 years   SUBJECTIVE:  SUBJECTIVE STATEMENT: Pt presents to PT with no current reports of pain. Feels like she has progressed really well with therapy and is ready to discharge.   EVAL: Patient reports sometimes when she walks she gets a terrible pain around the left knee and down the lower leg to the ankle. The pain has been going on for over 2 years but she didn't tell the doctor until it got so bad it affected her walking. Typically she can walk 1-15 minutes before it starts really aching but she just keeps walking. Also sometimes when she tries to relax at night she will get pain,  and sometimes it will wake her up at night. She does have trouble going up and down stairs and she has to go up 13 at her apartment so holds on the the rail. She did get a knee replacement on the right side in 2018 and it is doing good.   PERTINENT HISTORY: See PMH above  PAIN:  Are you having pain? Yes:  NPRS scale: 0/10 currently, 10/10 at worst with walking Pain location: Left knee  Pain description: Aching Aggravating factors: Walking, stairs, nocturnal pain Relieving factors: Medication, creams  PRECAUTIONS: None  WEIGHT BEARING RESTRICTIONS: No   PATIENT GOALS: Pain relief with walking   OBJECTIVE:  Note: Objective measures were completed at Evaluation unless otherwise noted. PATIENT SURVEYS:  LEFS 70/80 08/27/2023: 78/80  MUSCLE LENGTH: Hamstring tightness  POSTURE:   Bilateral knee varus  PALPATION: Tender to palpation parapatellar region, medial joint line, anterior tibia  LOWER EXTREMITY ROM:  Active ROM Right eval Left eval Left 08/07/2023 Left 08/09/2023 Left 08/21/23 Left 09/13/23  Hip flexion        Hip extension        Hip abduction        Hip adduction        Hip internal rotation        Hip external rotation        Knee flexion 92 105      Knee extension 0 Lacking 5 Lacking 5 Lacking 4 Lacking 2 Lacking 2  Ankle dorsiflexion        Ankle plantarflexion        Ankle inversion        Ankle eversion         (Blank rows = not tested)  LOWER EXTREMITY MMT:  MMT Right eval Left eval Right 09/13/23 Left 09/13/23  Hip flexion 4 4 5 5   Hip extension      Hip abduction 4- 3 5 5   Hip adduction      Hip internal rotation      Hip external rotation      Knee flexion      Knee extension 5 4  5   Ankle dorsiflexion 5 4  5   Ankle plantarflexion      Ankle inversion      Ankle eversion       (Blank rows = not tested)  FUNCTIONAL TESTS:  5 times sit to stand: 17 seconds  08/27/2023: 12 seconds 6 minute walk test: 1,110 ft  09/13/2023:  1270ft  GAIT: Distance walked: 1,110 ft Assistive device utilized: None Level of assistance: Complete Independence Comments: Antalgic gait on left, compensated trendelenburg  TREATMENT  [facility] Adult PT Treatment:                                                DATE: 09/13/2023 Therapeutic Exercise:  Seated knee ext 2x10 15# Seated knee flexion 2x15 25# Lateral walk GTB x 3 laps at counter Standing hip abd/ext 2x10 GTB Omega leg pres 3x10 75# Supine QS with block L x 10 - 5" hold Supine SLR 2x15 S/L hip abd 2x15 TKE with blue band 2x10 L Therapeutic Activity: STS 2x10 5lbs each hand NuStep L5 x 5 min with LE to improve LE endurance and workload capacity Review of tests/measures, goals   [facility] Adult PT Treatment:                                                DATE: 09/11/2023 Therapeutic Exercise:  Seated knee ext 2x10 15# Seated knee flexion 2x15 25# Lateral walk GTB x 3 laps at counter Standing hip abd/ext 2x10 30# Omega leg pres 3x10 75# Supine QS with block L x 10 - 5" hold Supine SLR 2x10 2# S/L hip abd 2x10 2# Slant board calf stretch 2x30 sec Therapeutic Activity: STS 2x10 10# - low table NuStep L5 x 5 min with LE to improve LE endurance and workload capacity TRX squat 2x10 - B UE  PATIENT EDUCATION:  Education details: HEP update Person educated: Patient Education method: Explanation, Demonstration, Tactile cues, Verbal cues, and Handouts Education comprehension: verbalized understanding, returned demonstration, verbal cues required, tactile cues required, and needs further education  HOME EXERCISE PROGRAM: Access Code: [email or web address] URL: [email or web address] Date: 09/13/2023 Prepared by: Loral Roch  Exercises - Long Sitting Quad Set with Towel Roll Under Heel  - 3 x weekly - 2 sets - 10 reps - 5 sec hold - Active Straight  Leg Raise with Quad Set  - 3 x weekly - 2-3 sets - 15 reps - Sidelying Hip Abduction  - 3 x weekly - 2-3 sets - 15 reps - Sit to Stand Without Arm Support  - 3 x weekly - 2-3 sets - 10 reps - 5lb hold - Knee Extension with Weight Machine  - 3 x weekly - 2-3 sets - 10 reps - 15lb hold - Hamstring Curl with Weight Machine  - 3 x weekly - 3 sets - 15 reps - 25lb hold - Full Leg Press  - 3 x weekly - 3 sets - 10 reps - 75lb hold - Side Stepping with Resistance at Ankles and Counter Support  - 3 x weekly - 3 sets - green band hold - Standing Hip Abduction with Resistance at Ankles and Counter Support  - 3 x weekly - 3 sets - 10 reps - green band hold - Standing Hip Extension with Resistance at Ankles and Unilateral Counter Support  - 3 x weekly - 3 sets - 10 reps - green band hold - Standing Terminal Knee Extension with Resistance  - 3 x weekly - 3 sets - 10 reps - blue band hold   ASSESSMENT: CLINICAL IMPRESSION: Pt was able to complete all prescribed exercises with no adverse effect and demonstrated knowledge of HEP. Over the course of PT treatment she has progressed very well, showing greatly improved strength,  knee ROM, and meeting all LTGs. Pt should continue to improve with HEP compliance and is being discharged at this time.   EVAL: Patient is a 68 y.o. female who was seen today for physical therapy evaluation and treatment for chronic left knee and lower leg pain. She demonstrates limitations in her knee mobility, strength, and decreased tolerance for activities such as walking, stairs, and transfers that is impacting her functional ability.  OBJECTIVE IMPAIRMENTS: Abnormal gait, decreased activity tolerance, decreased balance, decreased ROM, decreased strength, impaired flexibility, postural dysfunction, and pain.   ACTIVITY LIMITATIONS: standing, squatting, sleeping, stairs, transfers, and locomotion level  PARTICIPATION LIMITATIONS: meal prep, cleaning, shopping, and community  activity  PERSONAL FACTORS: Fitness, Past/current experiences, and Time since onset of injury/illness/exacerbation are also affecting patient's functional outcome.    GOALS: Goals reviewed with patient? Yes  SHORT TERM GOALS: Target date: 08/23/2023  Patient will be I with initial HEP in order to progress with therapy. Baseline: HEP provided at eval Goal status: MET  2.  Patient will report left knee pain with activity </= 6/10 in order to reduce functional limitation Baseline: 10/10 at worst Goal status: MET  3.  Patient will demonstrate left knee extension to neutral to improve gait Baseline: lacking 5 deg 08/23/23: lacks 5 09/13/23: lacks 2 Goal status: MOSTLY MET  LONG TERM GOALS: Target date: 09/20/2023  Patient will be I with final HEP to maintain progress from PT. Baseline: HEP provided at eval Goal status: MET  2.  Patient will report LEFS >/= 75/80 in order to indicate improvement in functional status Baseline: 70/80 08/27/2023: 78/80 Goal status: MET  3.  Patient will demonstrate left knee strength 5/5 MMT in order to improve walking tolerance Baseline: 4/5 MMT Goal status: MET  4.  Patient will perform 5xSTS </= 12 seconds to indicate improvement in LE strength and reduce fall risk Baseline: 17 seconds 08/27/2023: 12 seconds Goal status: MET  5.  Patient will perform >/= 1500 ft in order to improve community access Baseline: 1,110 ft 09/13/2023: 1273ft Goal status: MOSTLY MET  6.  Patient will report left knee pain </= 3/10 with extended periods of walking to reduce functional limitations Baseline: 10/10 pain Goal status: MET   PLAN: PT FREQUENCY: 1-2x/week  PT DURATION: 8 weeks  PLANNED INTERVENTIONS: 97164- PT Re-evaluation, 97110-Therapeutic exercises, 97530- Therapeutic activity, 97112- Neuromuscular re-education, 97535- Self Care, 72536- Manual therapy, [phone]- Gait training, [phone]- Aquatic Therapy, Patient/Family education, Balance training, Stair  training, Taping, Dry Needling, Joint mobilization, Joint manipulation, Cryotherapy, and Moist heat  PLAN FOR NEXT SESSION: Review HEP and progress PRN, manual/stretching to improve left knee extension, progress left knee and hip strengthening, balance training   Ivor Mars PT  09/14/23 7:57 AM

## 2023-09-25 ENCOUNTER — Ambulatory Visit: Admitting: Family Medicine

## 2023-09-25 ENCOUNTER — Encounter: Payer: Self-pay | Admitting: Family Medicine

## 2023-09-25 VITALS — BP 133/79 | HR 91 | Ht 67.0 in | Wt 177.2 lb

## 2023-09-25 DIAGNOSIS — Z0000 Encounter for general adult medical examination without abnormal findings: Secondary | ICD-10-CM | POA: Diagnosis not present

## 2023-09-25 DIAGNOSIS — Z794 Long term (current) use of insulin: Secondary | ICD-10-CM | POA: Diagnosis not present

## 2023-09-25 DIAGNOSIS — E2839 Other primary ovarian failure: Secondary | ICD-10-CM

## 2023-09-25 DIAGNOSIS — E1169 Type 2 diabetes mellitus with other specified complication: Secondary | ICD-10-CM

## 2023-09-25 LAB — POCT GLYCOSYLATED HEMOGLOBIN (HGB A1C): HbA1c, POC (controlled diabetic range): 7 % (ref 0.0–7.0)

## 2023-09-25 NOTE — Progress Notes (Signed)
 Subjective:   Kathy Duncan is a 68 y.o. female who presents for Medicare Annual (Subsequent) preventive examination.  Visit Complete: In person  Patient Medicare AWV questionnaire was completed by the patient on yes; I have confirmed that all information answered by patient is correct and no changes since this date.        Objective:     Today's Vitals   09/25/23 1102 09/25/23 1106  BP: 133/79   Pulse: 91   SpO2: 100%   Weight: 177 lb 3.2 oz (80.4 kg)   Height: 5\' 7"  (1.702 m)   PainSc: 0-No pain 0-No pain  Physical Exam Vitals and nursing note reviewed.  Constitutional:      Appearance: Normal appearance.  Cardiovascular:     Rate and Rhythm: Normal rate.     Heart sounds: Normal heart sounds. No murmur heard. Pulmonary:     Effort: Pulmonary effort is normal. No respiratory distress.     Breath sounds: Normal breath sounds.  Abdominal:     General: Abdomen is flat. Bowel sounds are normal. There is no distension.     Palpations: There is no mass.     Tenderness: There is no abdominal tenderness.  Musculoskeletal:     Right lower leg: No edema.     Left lower leg: No edema.  Neurological:     Mental Status: She is oriented to person, place, and time.  Psychiatric:        Mood and Affect: Mood normal.        Behavior: Behavior normal.     Body mass index is 27.75 kg/m.     09/25/2023   11:05 AM 08/29/2023   11:08 AM 07/26/2023    3:20 PM 06/26/2023    2:20 PM 06/12/2023    9:37 AM 03/06/2023    8:53 AM 01/09/2023    4:43 PM  Advanced Directives  Does Patient Have a Medical Advance Directive? No No No No No No No  Would patient like information on creating a medical advance directive? No - Patient declined No - Patient declined No - Patient declined No - Patient declined No - Patient declined      Current Medications (verified) Outpatient Encounter Medications as of 09/25/2023  Medication Sig   aspirin  EC 81 MG tablet Take 1 tablet (81 mg  total) by mouth daily. Swallow whole.   cholecalciferol (VITAMIN D3) 25 MCG (1000 UNIT) tablet Take 1,000 Units by mouth daily.   dapagliflozin  propanediol (FARXIGA ) 10 MG TABS tablet TAKE 1 TABLET BY MOUTH EVERY DAY   ferrous sulfate  325 (65 FE) MG tablet Take 1 tablet (325 mg total) by mouth daily with breakfast.   insulin  degludec (TRESIBA ) 100 UNIT/ML FlexTouch Pen Inject 20 Units into the skin daily.   losartan -hydrochlorothiazide  (HYZAAR) 50-12.5 MG tablet Take 1 tablet by mouth daily.   metFORMIN  (GLUCOPHAGE ) 500 MG tablet TAKE 1 TABLET BY MOUTH 2 TIMES DAILY WITH A MEAL.   rosuvastatin  (CRESTOR ) 20 MG tablet TAKE 1 TABLET BY MOUTH EVERY DAY   sitaGLIPtin  (JANUVIA ) 100 MG tablet TAKE 1 TABLET BY MOUTH EVERY DAY   cetirizine  (ZYRTEC  ALLERGY) 10 MG tablet Take 1 tablet (10 mg total) by mouth daily for 14 days.   diphenhydrAMINE  (BENADRYL ) 25 MG tablet Take 1 tablet (25 mg total) by mouth every 8 (eight) hours as needed. (Patient not taking: Reported on 09/25/2023)   Insulin  Pen Needle (BD PEN NEEDLE NANO 2ND GEN) 32G X 4 MM MISC Use  to inject insulin  as instructed   Lancets (ONETOUCH DELICA PLUS LANCET33G) MISC USE TO CHECK BLOOD SUGAR 3 TIMES A DAY   nitroGLYCERIN  (NITROSTAT ) 0.4 MG SL tablet Place 1 tablet (0.4 mg total) under the tongue every 5 (five) minutes as needed for chest pain. (Patient not taking: Reported on 09/25/2023)   ONETOUCH VERIO test strip USE TO CHECK BLOOD SUGAR 3 TIMES A DAY   No facility-administered encounter medications on file as of 09/25/2023.    Allergies (verified) Ace inhibitors   History: Past Medical History:  Diagnosis Date   Abnormal vaginal bleeding 07/29/2013   Anemia 07/12/2006   09/12/11: Anemia panel not suggestive of iron or B12 deficiency.  CBC with diff shows no pancytopenia.  Colonoscopy 2008 and referral in 2010 for heme positive stools not suggestive of need for further evaluation.  Most likely due to tamoxifen .    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   Procedure: RIGHT TOTAL KNEE ARTHROPLASTY;  Surgeon: Orvan Blanch, MD;  Location: [facility];  Service: Orthopedics;  Laterality: Right;  120 mins with abductor block   Family History  Problem Relation Age of Onset   Heart disease Mother    Stroke Father    Cancer Brother        Lung   Heart disease Sister    Social History   Socioeconomic History   Marital status: Divorced    Spouse name: Not on file   Number of children: Not on file   Years of education: Not on file   Highest education level: 12th grade  Occupational History   Not on file  Tobacco Use   Smoking status: Never   Smokeless tobacco: Never  Vaping Use   Vaping status: Never Used  Substance and Sexual Activity   Alcohol use: Yes   Drug use: No   Sexual activity: Not Currently    Comment: 1st intercourse 68 yo-Fewer than 5 partners  Other Topics Concern   Not on file  Social History Narrative   Works at [employer], does some heavy lifting.  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   Social Drivers of Corporate investment banker Strain: Low Risk  (06/19/2023)   Overall Financial Resource Strain (CARDIA)    Difficulty of Paying Living Expenses: Not very hard  Food Insecurity: No Food Insecurity (09/25/2023)   Hunger Vital Sign    Worried About Running Out of Food in the Last Year: Never true    Ran Out of Food in the Last Year: Never true  Transportation Needs: No Transportation Needs  (09/25/2023)   PRAPARE - Administrator, Civil Service (Medical): No    Lack of Transportation (Non-Medical): No  Physical Activity: Insufficiently Active (09/25/2023)   Exercise Vital Sign    Days of Exercise per Week: 3 days    Minutes of Exercise per Session: 20 min  Stress: No Stress Concern Present (09/25/2023)  [employer] of Occupational Health - Occupational Stress Questionnaire    Feeling of Stress : Only a little  Social Connections: Moderately Integrated (09/25/2023)   Social Connection and Isolation Panel [NHANES]    Frequency of Communication with Friends and Family: More than three times a week    Frequency of Social Gatherings with Friends and Family: Three times a week    Attends Religious Services: More than 4 times per year    Active Member of Clubs or Organizations: Yes    Attends Banker Meetings: More than 4 times per year    Marital Status: Widowed    Tobacco Counseling Counseling given: Yes   Clinical Intake:  Pre-visit preparation completed: Yes  Pain : No/denies pain Pain Score: 0-No pain     BMI - recorded: 27.75 Nutritional Status: BMI 25 -29 Overweight Diabetes: Yes CBG done?: Yes (Home CBG report -mostly in the 110s, some 80s and 129)  How often do you need to have someone help you when you read instructions, pamphlets, or other written materials from your doctor or pharmacy?: 1 - Never What is the last grade level you completed in school?: HS  Interpreter Needed?: No      Activities of Daily Living    09/25/2023   11:16 AM  In your present state of health, do you have any difficulty performing the following activities:  Hearing? 0  Vision? 0  Difficulty concentrating or making decisions? 0  Walking or climbing stairs? 0  Dressing or bathing? 0  Doing errands, shopping? 0    Patient Care Team: Arn Lane, MD as PCP - General (Family Medicine) Jann Melody, MD as PCP - Cardiology  (Cardiology)  Indicate any recent Medical Services you may have received from other than [facility] providers in the past year (date may be approximate).     Assessment:    This is a routine wellness examination for Kathy Duncan.  Hearing/Vision screen Hearing Screening   500Hz  1000Hz  2000Hz  4000Hz   Right ear Pass Pass Pass Pass  Left ear Pass Pass Pass Pass   Vision Screening   Right eye Left eye Both eyes  Without correction 20/20 20/20 20/20   With correction        Goals Addressed             This Visit's Progress    HEMOGLOBIN A1C < 7       Improving on exercise and diet to improve her diet. She does exercise at home with her home machine 3 times weekly She also walk 4 miles.       Depression Screen    09/25/2023   11:05 AM 08/29/2023   11:08 AM 06/26/2023    2:20 PM 06/14/2023   10:30 AM 06/12/2023    9:44 AM 05/28/2023   10:40 AM 03/06/2023    8:52 AM  PHQ 2/9 Scores  PHQ - 2 Score 0 0 0 1 0 0 0  PHQ- 9 Score 0 0 0 3 0 0 0    Fall Risk    09/25/2023   11:16 AM 09/25/2023   11:05 AM 08/29/2023   11:08 AM 06/26/2023    2:20 PM 05/28/2023   10:37 AM  Fall Risk   Falls in the past year? 0 0 0 0 0  Number falls in past yr: 0 0 0 0 0  Injury with Fall? 0 0 0 0 0  Risk for fall due to : No Fall Risks   No Fall  Risks   Follow up    Falls evaluation completed     MEDICARE RISK AT HOME:    TIMED UP AND GO:  Was the test performed?  Yes  Length of time to ambulate 10 feet: 9 sec Gait steady and fast without use of assistive device    Cognitive Function:        Immunizations Immunization History  Administered Date(s) Administered   Fluad Quad(high Dose 65+) 04/29/2021, 03/15/2022   Fluad Trivalent(High Dose 65+) 03/06/2023   Hepatitis B, ADULT 06/27/2022   Hepb-cpg 07/25/2022   Influenza Split 03/14/2011, 02/09/2012, 02/09/2012   Influenza Whole 03/20/2007, 03/03/2008, 02/22/2009   Influenza,inj,Quad PF,6+ Mos 03/27/2013, 02/19/2015, 04/28/2016,  02/02/2017, 04/05/2018, 03/14/2019, 02/06/2020   Influenza,inj,quad, With Preservative 02/14/2017   Influenza-Unspecified 01/16/2014   PFIZER Comirnaty(Gray Top)Covid-19 Tri-Sucrose Vaccine 03/15/2022   PFIZER(Purple Top)SARS-COV-2 Vaccination 07/25/2019, 08/15/2019, 03/28/2020   PNEUMOCOCCAL CONJUGATE-20 12/14/2020   Pfizer Covid-19 Vaccine Bivalent Booster 70yrs & up 04/29/2021   Pfizer(Comirnaty)Fall Seasonal Vaccine 12 years and older 03/06/2023   Pneumococcal Polysaccharide-23 04/30/2012, 11/09/2017   Td 05/15/2001   Tdap 09/12/2011, 12/02/2021   Zoster Recombinant(Shingrix) 04/22/2019, 09/18/2019    TDAP status: Up to date  Flu Vaccine status: Up to date  Pneumococcal vaccine status: Up to date  Covid-19 vaccine status: Completed vaccines  Qualifies for Shingles Vaccine? Yes   Zostavax completed No   Shingrix Completed?: Yes  Screening Tests Health Maintenance  Topic Date Due   COVID-19 Vaccine (7 - 2024-25 season) 02/25/2024 (Originally 09/04/2023)   INFLUENZA VACCINE  12/14/2023   Diabetic kidney evaluation - eGFR measurement  01/09/2024   OPHTHALMOLOGY EXAM  02/05/2024   Diabetic kidney evaluation - Urine ACR  03/05/2024   HEMOGLOBIN A1C  03/27/2024   FOOT EXAM  09/24/2024   Medicare Annual Wellness (AWV)  09/24/2024   MAMMOGRAM  02/20/2025   Colonoscopy  09/02/2026   DTaP/Tdap/Td (4 - Td or Tdap) 12/03/2031   Pneumonia Vaccine 39+ Years old  Completed   DEXA SCAN  Completed   Hepatitis C Screening  Completed   Zoster Vaccines- Shingrix  Completed   HPV VACCINES  Aged Out   Meningococcal B Vaccine  Aged Out    Health Maintenance  There are no preventive care reminders to display for this patient.   Colorectal cancer screening: Type of screening: Colonoscopy. Completed 2018. Repeat every 10 years  Mammogram status: Completed 1. Repeat every year  Bone Density status: Ordered today. Pt provided with contact info and advised to call to schedule  appt.  Lung Cancer Screening: (Low Dose CT Chest recommended if Age 42-80 years, 20 pack-year currently smoking OR have quit w/in 15years.) does not qualify.   Lung Cancer Screening Referral: N/A  Additional Screening:  Hepatitis C Screening: does not qualify; Completed 2016  Vision Screening: Recommended annual ophthalmology exams for early detection of glaucoma and other disorders of the eye. Is the patient up to date with their annual eye exam?  Yes  Who is the provider or what is the name of the office in which the patient attends annual eye exams? Dr. Seward Dao - Sept 2025 If pt is not established with a provider, would they like to be referred to a provider to establish care? No .   Dental Screening: Recommended annual dental exams for proper oral hygiene:She is compliant with dental care follow up. Next appointment in  June 2025   Diabetic Foot Exam: Diabetic Foot Exam: Completed 09/25/23  Community Resource Referral / Chronic Care Management:  CRR required this visit?  No   CCM required this visit?  No     Plan:     I have personally reviewed and noted the following in the patient's chart:   Medical and social history Use of alcohol, tobacco or illicit drugs  Current medications and supplements including opioid prescriptions. Patient is not currently taking opioid prescriptions. Functional ability and status Nutritional status Physical activity Advanced directives List of other physicians Hospitalizations, surgeries, and ER visits in previous 12 months Vitals Screenings to include cognitive, depression, and falls Referrals and appointments  In addition, I have reviewed and discussed with patient certain preventive protocols, quality metrics, and best practice recommendations. A written personalized care plan for preventive services as well as general preventive health recommendations were provided to patient.     Penni Bowman, MD   09/25/2023   After Visit  Summary: (In Person-Printed) AVS printed and given to the patient

## 2023-09-25 NOTE — Patient Instructions (Signed)
 Preventive Care 43 Years and Older, Female Preventive care refers to lifestyle choices and visits with your health care provider that can promote health and wellness. Preventive care visits are also called wellness exams. What can I expect for my preventive care visit? Counseling Your health care provider may ask you questions about your: Medical history, including: Past medical problems. Family medical history. Pregnancy and menstrual history. History of falls. Current health, including: Memory and ability to understand (cognition). Emotional well-being. Home life and relationship well-being. Sexual activity and sexual health. Lifestyle, including: Alcohol, nicotine or tobacco, and drug use. Access to firearms. Diet, exercise, and sleep habits. Work and work Astronomer. Sunscreen use. Safety issues such as seatbelt and bike helmet use. Physical exam Your health care provider will check your: Height and weight. These may be used to calculate your BMI (body mass index). BMI is a measurement that tells if you are at a healthy weight. Waist circumference. This measures the distance around your waistline. This measurement also tells if you are at a healthy weight and may help predict your risk of certain diseases, such as type 2 diabetes and high blood pressure. Heart rate and blood pressure. Body temperature. Skin for abnormal spots. What immunizations do I need?  Vaccines are usually given at various ages, according to a schedule. Your health care provider will recommend vaccines for you based on your age, medical history, and lifestyle or other factors, such as travel or where you work. What tests do I need? Screening Your health care provider may recommend screening tests for certain conditions. This may include: Lipid and cholesterol levels. Hepatitis C test. Hepatitis B test. HIV (human immunodeficiency virus) test. STI (sexually transmitted infection) testing, if you are at  risk. Lung cancer screening. Colorectal cancer screening. Diabetes screening. This is done by checking your blood sugar (glucose) after you have not eaten for a while (fasting). Mammogram. Talk with your health care provider about how often you should have regular mammograms. BRCA-related cancer screening. This may be done if you have a family history of breast, ovarian, tubal, or peritoneal cancers. Bone density scan. This is done to screen for osteoporosis. Talk with your health care provider about your test results, treatment options, and if necessary, the need for more tests. Follow these instructions at home: Eating and drinking  Eat a diet that includes fresh fruits and vegetables, whole grains, lean protein, and low-fat dairy products. Limit your intake of foods with high amounts of sugar, saturated fats, and salt. Take vitamin and mineral supplements as recommended by your health care provider. Do not drink alcohol if your health care provider tells you not to drink. If you drink alcohol: Limit how much you have to 0-1 drink a day. Know how much alcohol is in your drink. In the [place] one drink equals one 12 oz bottle of beer (355 mL), one 5 oz glass of wine (148 mL), or one 1 oz glass of hard liquor (44 mL). Lifestyle Brush your teeth every morning and night with fluoride toothpaste. Floss one time each day. Exercise for at least 30 minutes 5 or more days each week. Do not use any products that contain nicotine or tobacco. These products include cigarettes, chewing tobacco, and vaping devices, such as e-cigarettes. If you need help quitting, ask your health care provider. Do not use drugs. If you are sexually active, practice safe sex. Use a condom or other form of protection in order to prevent STIs. Take aspirin only as told by  your health care provider. Make sure that you understand how much to take and what form to take. Work with your health care provider to find out whether it  is safe and beneficial for you to take aspirin daily. Ask your health care provider if you need to take a cholesterol-lowering medicine (statin). Find healthy ways to manage stress, such as: Meditation, yoga, or listening to music. Journaling. Talking to a trusted person. Spending time with friends and family. Minimize exposure to UV radiation to reduce your risk of skin cancer. Safety Always wear your seat belt while driving or riding in a vehicle. Do not drive: If you have been drinking alcohol. Do not ride with someone who has been drinking. When you are tired or distracted. While texting. If you have been using any mind-altering substances or drugs. Wear a helmet and other protective equipment during sports activities. If you have firearms in your house, make sure you follow all gun safety procedures. What's next? Visit your health care provider once a year for an annual wellness visit. Ask your health care provider how often you should have your eyes and teeth checked. Stay up to date on all vaccines. This information is not intended to replace advice given to you by your health care provider. Make sure you discuss any questions you have with your health care provider. Document Revised: 10/27/2020 Document Reviewed: 10/27/2020 Elsevier Patient Education  2024 [employer].

## 2023-09-30 ENCOUNTER — Other Ambulatory Visit: Payer: Self-pay | Admitting: Family Medicine

## 2023-10-02 ENCOUNTER — Other Ambulatory Visit: Payer: Self-pay | Admitting: Family Medicine

## 2023-10-08 ENCOUNTER — Other Ambulatory Visit: Payer: Self-pay | Admitting: Family Medicine

## 2023-10-16 ENCOUNTER — Other Ambulatory Visit: Payer: Self-pay | Admitting: Family Medicine

## 2023-11-03 ENCOUNTER — Other Ambulatory Visit: Payer: Self-pay | Admitting: Family Medicine

## 2023-12-18 ENCOUNTER — Other Ambulatory Visit: Payer: Self-pay | Admitting: Family Medicine

## 2023-12-18 DIAGNOSIS — N898 Other specified noninflammatory disorders of vagina: Secondary | ICD-10-CM

## 2023-12-31 ENCOUNTER — Telehealth: Payer: Self-pay

## 2023-12-31 NOTE — Telephone Encounter (Signed)
 Patient calls nurse line requesting to speak with Dr. Anders regarding a personal matter. Advised that I was one of the nurses and could send provider message. She is needing referrals to OBGYN and orthopedic specialist.    She states that she has been having discoloration of private parts and vaginal itching. She reports that this has been going on for awhile now and would like to see specialist.   She also reports joint pain in right upper shoulder.   I advised patient that I would recommend scheduling an appointment, as referrals require specific documentation to ensure she receives the appropriate care.   Scheduled patient with PCP on 01/18/24.  Patient appreciative.   Chiquita JAYSON English, RN

## 2024-01-03 ENCOUNTER — Other Ambulatory Visit: Payer: Self-pay | Admitting: Family Medicine

## 2024-01-16 ENCOUNTER — Encounter: Payer: Self-pay | Admitting: Family Medicine

## 2024-01-18 ENCOUNTER — Ambulatory Visit (INDEPENDENT_AMBULATORY_CARE_PROVIDER_SITE_OTHER): Admitting: Family Medicine

## 2024-01-18 ENCOUNTER — Encounter: Payer: Self-pay | Admitting: Family Medicine

## 2024-01-18 ENCOUNTER — Ambulatory Visit
Admission: RE | Admit: 2024-01-18 | Discharge: 2024-01-18 | Disposition: A | Discharge status: Home / Self Care | Source: Ambulatory Visit | Attending: Family Medicine | Admitting: Family Medicine

## 2024-01-18 VITALS — BP 127/75 | HR 95 | Ht 67.0 in | Wt 179.0 lb

## 2024-01-18 DIAGNOSIS — G8929 Other chronic pain: Secondary | ICD-10-CM

## 2024-01-18 DIAGNOSIS — Z794 Long term (current) use of insulin: Secondary | ICD-10-CM | POA: Diagnosis not present

## 2024-01-18 DIAGNOSIS — Z23 Encounter for immunization: Secondary | ICD-10-CM | POA: Diagnosis not present

## 2024-01-18 DIAGNOSIS — E1169 Type 2 diabetes mellitus with other specified complication: Secondary | ICD-10-CM | POA: Diagnosis not present

## 2024-01-18 DIAGNOSIS — M25511 Pain in right shoulder: Secondary | ICD-10-CM | POA: Diagnosis not present

## 2024-01-18 DIAGNOSIS — M19011 Primary osteoarthritis, right shoulder: Secondary | ICD-10-CM | POA: Diagnosis not present

## 2024-01-18 DIAGNOSIS — I10 Essential (primary) hypertension: Secondary | ICD-10-CM

## 2024-01-18 LAB — POCT GLYCOSYLATED HEMOGLOBIN (HGB A1C): HbA1c, POC (controlled diabetic range): 7.7 % — AB (ref 0.0–7.0)

## 2024-01-18 MED ORDER — NYSTATIN 100000 UNIT/GM EX OINT: TOPICAL_OINTMENT | CUTANEOUS | 0 refills | Status: AC

## 2024-01-18 MED ORDER — DICLOFENAC SODIUM 1 % EX GEL: 2.0000 g | Freq: Four times a day (QID) | CUTANEOUS | 1 refills | Status: AC | PRN

## 2024-01-18 NOTE — Progress Notes (Signed)
 SUBJECTIVE:   CHIEF COMPLAINT / HPI:   Discussed the use of AI scribe software for clinical note transcription with the patient, who gave verbal consent to proceed.  History of Present Illness   Kathy Duncan is a 68 year old female with diabetes and hypertension who presents with right shoulder pain and vaginal irritation.  She has been experiencing severe right shoulder pain for the past two months, particularly during activities such as bathing or lifting objects. She uses Tylenol  and a cream for relief, but the pain persists. There is no history of trauma or injury to the shoulder, and she avoids sleeping on her right side. She is semi-retired and does not engage in activities that strain the shoulder.  She reports vaginal irritation, described as similar to a yeast infection, with itching but no discharge. Over-the-counter yeast infection cream has provided some relief, and the irritation is significantly improved, though some itching and swelling in the groin area persist.  Her current medications include baby aspirin , Zyrtec  as needed, betamethasone  cream, vitamin D  supplement, Farxiga  10 mg, Benadryl  as needed, iron supplement, Tresiba  insulin  20 units, Januvia  100 mg daily, losartan , a combined blood pressure medication, metformin  500 mg twice daily, and Crestor  20 mg daily. She monitors her blood glucose levels, which are generally well-controlled with occasional spikes. Her last A1c was 7.7, up from 7.0. She has an appointment scheduled for a diabetic eye exam on September 22nd.  No recent trauma or injury to the right shoulder and no vaginal discharge, only irritation.       PERTINENT  PMH / PSH: pmhx reviewed  OBJECTIVE:   BP 127/75   Pulse 95   Ht 5' 7 (1.702 m)   Wt 179 lb (81.2 kg)   SpO2 96%   BMI 28.04 kg/m   Physical Exam   VITALS: BP- 127/75 CHEST: Lungs clear to auscultation bilaterally. CARDIOVASCULAR: Heart regular rate and rhythm, no  murmurs. ABDOMEN: Normal bowel sounds, no abdominal tenderness. EXTREMITIES: No edema in extremities. MUSCULOSKELETAL: No swelling in shoulders. Pain in right shoulder on movement. GU: Declined exam today as her symptoms improved       ASSESSMENT/PLAN:   Assessment & Plan    Assessment and Plan    Pain in right shoulder Chronic right shoulder pain for two months, worsened by movement. - Order x-ray of the right shoulder. - Prescribe Voltaren  gel for topical pain relief. - Continue Tylenol  for pain management. - Provide shoulder exercises to perform at home. - Consider referral to physical therapy based on x-ray results.  Type 2 diabetes mellitus Type 2 diabetes mellitus with A1c of 7.7, increased from 7.0. Blood glucose generally well-controlled with occasional spikes. Current medications: Farxiga , Tresiba , Januvia , Metformin . - Recheck A1c in three months. - Encourage adherence to current medication regimen. - Advise on maintaining a healthy diet and regular exercise. - Follow up with ophthalmology for diabetic eye exam on February 04, 2024. - Perform blood and urine tests to monitor diabetes management.  Essential hypertension Essential hypertension well-controlled with current medication regimen. Recent blood pressure reading of 127/75 mmHg.  Vulvar irritation, likely candidiasis Vulvar irritation with symptoms consistent with candidiasis, improved with over-the-counter cream. No vaginal discharge reported. - Prescribe nystatin  cream for application in the groin area. - Monitor symptoms and consider examination if symptoms worsen. - Discuss referral to a gynecologist if symptoms persist and require further evaluation as she declined GU exam today.         Otto Fairly, MD  [facility]

## 2024-01-18 NOTE — Patient Instructions (Signed)

## 2024-01-19 ENCOUNTER — Ambulatory Visit: Payer: Self-pay | Admitting: Family Medicine

## 2024-01-19 LAB — BASIC METABOLIC PANEL WITH GFR
BUN/Creatinine Ratio: 24 (ref 12–28)
BUN: 24 mg/dL (ref 8–27)
CO2: 22 mmol/L (ref 20–29)
Calcium: 9.7 mg/dL (ref 8.7–10.3)
Chloride: 96 mmol/L (ref 96–106)
Creatinine, Ser: 0.98 mg/dL (ref 0.57–1.00)
Glucose: 173 mg/dL — ABNORMAL HIGH (ref 70–99)
Potassium: 4.4 mmol/L (ref 3.5–5.2)
Sodium: 135 mmol/L (ref 134–144)
eGFR: 63 mL/min/1.73 (ref >59)

## 2024-01-20 LAB — MICROALBUMIN / CREATININE URINE RATIO
Creatinine, Urine: 20.7 mg/dL
Microalb/Creat Ratio: <14 mg/g{creat} (ref 0–29)
Microalbumin, Urine: <3 ug/mL

## 2024-01-28 NOTE — Telephone Encounter (Signed)
 I called and discussed all test results with her. 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 F/U as needed. She was appreciative of the call.

## 2024-02-04 DIAGNOSIS — E113293 Type 2 diabetes mellitus with mild nonproliferative diabetic retinopathy without macular edema, bilateral: Secondary | ICD-10-CM | POA: Diagnosis not present

## 2024-02-04 DIAGNOSIS — H43813 Vitreous degeneration, bilateral: Secondary | ICD-10-CM | POA: Diagnosis not present

## 2024-02-04 LAB — HM DIABETES EYE EXAM

## 2024-02-15 NOTE — Progress Notes (Signed)
 Kathy Duncan                                          MRN: [id number]   02/15/2024   The [facility] Quality Team Specialist reviewed this patient medical record for the purposes of chart review for care gap closure. The following were reviewed: abstraction for care gap closure-kidney health evaluation for diabetes:eGFR  and uACR.    [facility] Quality Team

## 2024-02-22 ENCOUNTER — Ambulatory Visit
Admission: RE | Admit: 2024-02-22 | Discharge: 2024-02-22 | Disposition: A | Discharge status: Home / Self Care | Source: Ambulatory Visit | Attending: Physician Assistant | Admitting: Physician Assistant

## 2024-02-22 DIAGNOSIS — Z794 Long term (current) use of insulin: Secondary | ICD-10-CM | POA: Diagnosis not present

## 2024-02-22 DIAGNOSIS — E119 Type 2 diabetes mellitus without complications: Secondary | ICD-10-CM | POA: Diagnosis not present

## 2024-02-22 DIAGNOSIS — E785 Hyperlipidemia, unspecified: Secondary | ICD-10-CM | POA: Diagnosis not present

## 2024-02-22 DIAGNOSIS — I10 Essential (primary) hypertension: Secondary | ICD-10-CM | POA: Diagnosis not present

## 2024-02-22 DIAGNOSIS — M1990 Unspecified osteoarthritis, unspecified site: Secondary | ICD-10-CM | POA: Diagnosis not present

## 2024-02-22 DIAGNOSIS — I2510 Atherosclerotic heart disease of native coronary artery without angina pectoris: Secondary | ICD-10-CM | POA: Diagnosis not present

## 2024-02-22 DIAGNOSIS — Z7984 Long term (current) use of oral hypoglycemic drugs: Secondary | ICD-10-CM | POA: Diagnosis not present

## 2024-02-22 DIAGNOSIS — J302 Other seasonal allergic rhinitis: Secondary | ICD-10-CM | POA: Diagnosis not present

## 2024-02-22 DIAGNOSIS — Z1231 Encounter for screening mammogram for malignant neoplasm of breast: Secondary | ICD-10-CM | POA: Diagnosis not present

## 2024-02-28 DIAGNOSIS — M25511 Pain in right shoulder: Secondary | ICD-10-CM | POA: Diagnosis not present

## 2024-03-25 ENCOUNTER — Other Ambulatory Visit: Payer: Self-pay | Admitting: Family Medicine

## 2024-04-21 ENCOUNTER — Encounter: Payer: Self-pay | Admitting: Family Medicine

## 2024-04-25 ENCOUNTER — Encounter: Payer: Self-pay | Admitting: Family Medicine

## 2024-04-25 ENCOUNTER — Ambulatory Visit: Admitting: Family Medicine

## 2024-04-25 VITALS — BP 138/92 | HR 93 | Wt 185.2 lb

## 2024-04-25 DIAGNOSIS — E1169 Type 2 diabetes mellitus with other specified complication: Secondary | ICD-10-CM

## 2024-04-25 DIAGNOSIS — I10 Essential (primary) hypertension: Secondary | ICD-10-CM

## 2024-04-25 DIAGNOSIS — E1165 Type 2 diabetes mellitus with hyperglycemia: Secondary | ICD-10-CM

## 2024-04-25 DIAGNOSIS — Z23 Encounter for immunization: Secondary | ICD-10-CM

## 2024-04-25 DIAGNOSIS — E113299 Type 2 diabetes mellitus with mild nonproliferative diabetic retinopathy without macular edema, unspecified eye: Secondary | ICD-10-CM

## 2024-04-25 LAB — POCT GLYCOSYLATED HEMOGLOBIN (HGB A1C): HbA1c, POC (controlled diabetic range): 9.1 % — AB (ref 0.0–7.0)

## 2024-04-25 MED ORDER — SITAGLIPTIN PHOSPHATE 100 MG PO TABS: 100.0000 mg | ORAL_TABLET | Freq: Every day | ORAL | 1 refills | Status: AC

## 2024-04-25 MED ORDER — ONETOUCH DELICA PLUS LANCET33G MISC: 2 refills | Status: AC

## 2024-04-25 MED ORDER — TRESIBA FLEXTOUCH 100 UNIT/ML ~~LOC~~ SOPN: 25.0000 [IU] | PEN_INJECTOR | Freq: Every day | SUBCUTANEOUS | Status: AC

## 2024-04-25 MED ORDER — METFORMIN HCL 500 MG PO TABS: 1000.0000 mg | ORAL_TABLET | Freq: Two times a day (BID) | ORAL | Status: AC

## 2024-04-25 NOTE — Patient Instructions (Signed)
 It was nice seeing you today. Your BP is a bit up, but not too bad. We can continue same medication for HTN. Your A1C is higher than the previous. Continue current dose of Farxiga  and Januvia . We will increase your insulin  to 25 units daily and Metformin  to 1000 mg BID. Follow up in 3 months for reassessment.

## 2024-04-25 NOTE — Assessment & Plan Note (Addendum)
 A1c increased to 9.1%, indicating suboptimal glycemic control despite current medication regimen. - Increased Tresiba  insulin  to 25 units daily. - Increased metformin  to 1000 mg twice daily. - Continue Farxiga  10 mg daily. - Continue Januvia  100 mg daily. - Follow up in 3 months for A1c reassessment. - Recent DM eye exam completed -Continue F/U with Ophthalmology

## 2024-04-25 NOTE — Progress Notes (Addendum)
° ° °  SUBJECTIVE:   CHIEF COMPLAINT / HPI:   Discussed the use of AI scribe software for clinical note transcription with the patient, who gave verbal consent to proceed.  History of Present Illness   Kathy Duncan is a 68 year old female with diabetes and hypertension who presents for medication refills and blood pressure monitoring.  She requested refills for her medications, including Januvia  and lancets for her glucometer. Her blood glucose levels at home have generally been good, with a recent reading of 104 mg/dL, although she recalls higher readings in November around 200 mg/dL. Her last recorded A1c in September was 7.7%. She is currently taking Farxiga  10 mg daily, Januvia  100 mg daily, Metformin  500 mg twice daily, and Tresiba  insulin  20 units daily. She reports taking all her medications as prescribed.  She has a history of hypertension with good home blood pressure readings, the most recent being 125/76 mmHg. She takes Losartan /Hydrochlorothiazide  50/12.5 mg daily. During today's visit, her blood pressure was slightly elevated at 147/88 mmHg, but she mentions that her home readings are typically within normal range.  Her current medication list also includes baby aspirin , Zyrtec  10 mg daily, Vitamin D  1000 units daily, Voltaren  gel as needed, Benadryl  as needed, iron supplements, and Crestor  20 mg daily. She confirms taking all her medications as prescribed.  She tries to maintain a healthy lifestyle by walking during lunchtime and wearing appropriate clothing for the weather. She recently had an eye exam in September 2022 and is scheduled to receive her COVID-19 vaccination today.       PERTINENT  PMH / PSH: PMHx reviewed  OBJECTIVE:   BP (!) 138/92   Pulse 93   Wt 185 lb 3.2 oz (84 kg)   SpO2 97%   BMI 29.01 kg/m   Physical Exam   VITALS: BP- 143/87 GEN: No Distress CHEST: Lungs clear to auscultation, no wheezing. CARDIOVASCULAR: Regular rate and rhythm, no  murmurs. ABDOMEN: Normal bowel sounds, no distension, no tenderness. EXTREMITIES: No edema, no swelling.     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 - Increased Tresiba  insulin  to 25 units daily. - Increased metformin  to 1000 mg twice daily. - Continue Farxiga  10 mg daily. - Continue Januvia  100 mg daily. - Follow up in 3 months for A1c reassessment. - Recent DM eye exam completed -Continue F/U with Ophthalmology Encounter for immunization Received COVID-19 vaccination.  Hyperlipidemia associated with type 2 diabetes mellitus (HCC) Continue Crestor  20 mg daily. Essential hypertension Office blood pressure at 147/88 mmHg; home readings are satisfactory. - Continue losartan /hydrochlorothiazide  50/12.5 mg daily. - Monitor blood pressure at home.   NB: I received an SSBCI Verification form completion request She confirmed this is due to her change in insurance plan She approved form completion. See media for form copy Form placed for faxing.   Otto Fairly, MD [facility]

## 2024-04-25 NOTE — Assessment & Plan Note (Addendum)
 Continue Crestor 20 mg daily.

## 2024-04-25 NOTE — Assessment & Plan Note (Addendum)
 Office blood pressure at 147/88 mmHg; home readings are satisfactory. - Continue losartan /hydrochlorothiazide  50/12.5 mg daily. - Monitor blood pressure at home.

## 2024-04-28 NOTE — Addendum Note (Signed)
 Addended by: ANDERS CUMINS T on: 04/28/2024 08:00 AM   Modules accepted: Level of Service

## 2024-05-28 ENCOUNTER — Telehealth

## 2024-05-28 ENCOUNTER — Encounter: Payer: Self-pay | Admitting: *Deleted

## 2024-05-28 ENCOUNTER — Ambulatory Visit: Admission: EM | Admit: 2024-05-28 | Discharge: 2024-05-28 | Disposition: A | Discharge status: Home / Self Care

## 2024-05-28 DIAGNOSIS — L02414 Cutaneous abscess of left upper limb: Secondary | ICD-10-CM | POA: Diagnosis not present

## 2024-05-28 DIAGNOSIS — L0291 Cutaneous abscess, unspecified: Secondary | ICD-10-CM

## 2024-05-28 MED ORDER — SULFAMETHOXAZOLE-TRIMETHOPRIM 800-160 MG PO TABS: 1.0000 | ORAL_TABLET | Freq: Two times a day (BID) | ORAL | 0 refills | Status: AC

## 2024-05-28 NOTE — ED Provider Notes (Signed)
 " [facility]    CSN: [id number] Arrival date & time: 05/28/24  1450      History   Chief Complaint Chief Complaint  Patient presents with   Abscess    HPI Kathy Duncan is a 69 y.o. female.   Pt with a hx of cutaneous abscess, presents today due to abscess of left forearm for the past 7 days. Pt states that is usually able to treat abscess with boil-ez but this abscess has not been affected by it. Pt states that she has been able to express drainage from area. Pt denies fever, chills, or nausea.   The history is provided by the patient.  Abscess   Past Medical History:  Diagnosis Date   Abnormal vaginal bleeding 07/29/2013   Anemia 07/12/2006   09/12/11: Anemia panel not suggestive of iron or B12 deficiency.  CBC with diff shows no pancytopenia.  Colonoscopy 2008 and referral in 2010 for heme positive stools not suggestive of need for further evaluation.  Most likely due to tamoxifen .    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) 06/13/2022   Posterior vitreous detachment of both eyes 02/02/2022   Pseudophakia of both eyes 02/02/2022   Essential hypertension 11/01/2020   Coronary artery disease involving native coronary artery of native heart without angina pectoris 06/17/2020   Aortic atherosclerosis 06/17/2020   Retinal hemorrhage of left eye 05/04/2020   Atypical ductal hyperplasia of breast 12/15/2010   Hyperlipidemia associated  with type 2 diabetes mellitus (HCC) 12/28/2006   CARPAL TUNNEL SYNDROME, BILATERAL 12/28/2006   OBESITY, NOS 07/12/2006    Past Surgical History:  Procedure Laterality Date   ABDOMINAL HYSTERECTOMY     has ovaries   BREAST EXCISIONAL BIOPSY Right 09/2009   TOTAL KNEE ARTHROPLASTY Right 03/01/2017   Procedure: RIGHT TOTAL KNEE ARTHROPLASTY;  Surgeon: Duwayne Purchase, MD;  Location: [facility];  Service: Orthopedics;  Laterality: Right;  120 mins with abductor block    OB History     Gravida  2   Para  2   Term      Preterm      AB      Living  2      SAB      IAB      Ectopic      Multiple      Live Births               Home Medications    Prior to Admission medications  Medication Sig Start Date End Date Taking? Authorizing Provider  aspirin  EC 81 MG tablet Take 1 tablet (81 mg total) by mouth daily. Swallow whole. 04/29/21  Yes Anders Otto DASEN, MD  cholecalciferol (VITAMIN D3) 25 MCG (1000 UNIT) tablet Take 1,000 Units by mouth daily.   Yes [provider]  diclofenac  Sodium (VOLTAREN ) 1 % GEL Apply 2 g topically 4 (four) times daily as needed (  Shoulder pain). 01/18/24  Yes Anders Otto DASEN, MD  diphenhydrAMINE  (BENADRYL ) 25 MG tablet Take 1 tablet (25 mg total) by mouth every 8 (eight) hours as needed. 02/06/20  Yes Anders Otto DASEN, MD  FARXIGA  10 MG TABS tablet TAKE 1 TABLET BY MOUTH EVERY DAY 03/26/24  Yes Anders Otto DASEN, MD  ferrous sulfate  325 (65 FE) MG tablet Take 1 tablet (325 mg total) by mouth daily with breakfast. 09/12/11  Yes Briscoe, Luke POUR, MD  insulin  degludec (TRESIBA  FLEXTOUCH) 100 UNIT/ML FlexTouch Pen Inject 25 Units into the skin daily. 04/25/24  Yes Anders Otto DASEN, MD  losartan -hydrochlorothiazide  (HYZAAR) 50-12.5 MG tablet TAKE 1 TABLET BY MOUTH EVERY DAY 03/26/24  Yes Anders Otto DASEN, MD  metFORMIN  (GLUCOPHAGE ) 500 MG tablet Take 2 tablets (1,000 mg total) by mouth 2 (two) times daily with a meal. 04/25/24  Yes Eniola,  Kehinde T, MD  rosuvastatin  (CRESTOR ) 20 MG tablet TAKE 1 TABLET BY MOUTH EVERY DAY 01/03/24  Yes Anders Otto T, MD  sitaGLIPtin  (JANUVIA ) 100 MG tablet Take 1 tablet (100 mg total) by mouth daily. 04/25/24  Yes Anders Otto DASEN, MD  sulfamethoxazole -trimethoprim  (BACTRIM  DS) 800-160 MG tablet Take 1 tablet by mouth 2 (two) times daily for 10 days. 05/28/24 06/07/24 Yes Andra Krabbe C, PA-C  augmented betamethasone  dipropionate (DIPROLENE -AF) 0.05 % cream APPLY TWICE A DAY TO THE AFFECTED SKIN 01/03/24   Anders Otto DASEN, MD  cetirizine  (ZYRTEC  ALLERGY) 10 MG tablet Take 1 tablet (10 mg total) by mouth daily for 14 days. 08/29/23 04/25/24  Rosendo Norleen BROCKS, MD  Insulin  Pen Needle (BD PEN NEEDLE NANO 2ND GEN) 32G X 4 MM MISC USE TO INJECT INSULIN  AS INSTRUCTED 10/09/23   Anders Otto DASEN, MD  Lancets (ONETOUCH DELICA PLUS LANCET33G) MISC USE TO CHECK BLOOD SUGAR 3 TIMES A DAY 04/25/24   Anders Otto DASEN, MD  nitroGLYCERIN  (NITROSTAT ) 0.4 MG SL tablet Place 1 tablet (0.4 mg total) under the tongue every 5 (five) minutes as needed for chest pain. 01/20/22   Santo Stanly LABOR, MD  ONETOUCH VERIO test strip USE TO CHECK BLOOD SUGAR 3 TIMES A DAY 01/03/24   Anders Otto DASEN, MD    Family History Family History  Problem Relation Age of Onset   Heart disease Mother    Stroke Father    Heart disease Sister    Cancer Brother        Lung   Breast cancer Neg Hx     Social History Social History[1]   Allergies   Ace inhibitors   Review of Systems Review of Systems   Physical Exam Triage Vital Signs ED Triage Vitals  Encounter Vitals Group     BP 05/28/24 1629 (!) 145/80     Girls Systolic BP Percentile --      Girls Diastolic BP Percentile --      Boys Systolic BP Percentile --      Boys Diastolic BP Percentile --      Pulse Rate 05/28/24 1629 (!) 110     Resp 05/28/24 1629 16     Temp 05/28/24 1629 98.8 F (37.1 C)     Temp Source 05/28/24 1629 Oral     SpO2 05/28/24  1629 98 %     Weight --      Height --      Head Circumference --      Peak Flow --      Pain Score 05/28/24 1631 8  Pain Loc --      Pain Education --      Exclude from Growth Chart --    No data found.  Updated Vital Signs BP (!) 145/80   Pulse (!) 110   Temp 98.8 F (37.1 C) (Oral)   Resp 16   SpO2 98%   Visual Acuity Right Eye Distance:   Left Eye Distance:   Bilateral Distance:    Right Eye Near:   Left Eye Near:    Bilateral Near:     Physical Exam Vitals and nursing note reviewed.  Constitutional:      General: She is not in acute distress.    Appearance: Normal appearance. She is not ill-appearing, toxic-appearing or diaphoretic.  Eyes:     General: No scleral icterus. Cardiovascular:     Rate and Rhythm: Normal rate and regular rhythm.     Heart sounds: Normal heart sounds.  Pulmonary:     Effort: Pulmonary effort is normal. No respiratory distress.     Breath sounds: Normal breath sounds. No wheezing or rhonchi.  Skin:    General: Skin is warm.         Comments: Tender abscess noted of left forearm, appears to be in healing phase but is mildly tender to palpation   Neurological:     Mental Status: She is alert and oriented to person, place, and time.  Psychiatric:        Mood and Affect: Mood normal.        Behavior: Behavior normal.      UC Treatments / Results  Labs (all labs ordered are listed, but only abnormal results are displayed) Labs Reviewed  AEROBIC CULTURE W GRAM STAIN (SUPERFICIAL SPECIMEN)    EKG   Radiology No results found.  Procedures Procedures (including critical care time)  Medications Ordered in UC Medications - No data to display  Initial Impression / Assessment and Plan / UC Course  I have reviewed the triage vital signs and the nursing notes.  Pertinent labs & imaging results that were available during my care of the patient were reviewed by me and considered in my medical decision making (see chart for  details).     Final Clinical Impressions(s) / UC Diagnoses   Final diagnoses:  Abscess of left forearm     Discharge Instructions      Use warm compresses to affected area 20 mins at a time a couple times a day  Be sure to complete antibiotics.     ED Prescriptions     Medication Sig Dispense Auth. Provider   sulfamethoxazole -trimethoprim  (BACTRIM  DS) 800-160 MG tablet Take 1 tablet by mouth 2 (two) times daily for 10 days. 20 tablet Andra Corean BROCKS, PA-C      PDMP not reviewed this encounter.    [1]  Social History Tobacco Use   Smoking status: Never   Smokeless tobacco: Never  Vaping Use   Vaping status: Never Used  Substance Use Topics   Alcohol use: Yes    Comment: occasionally   Drug use: No     Andra Corean BROCKS, PA-C 05/28/24 1826  "

## 2024-05-28 NOTE — Patient Instructions (Signed)
 " Kathy Duncan, thank you for joining [name], NP for today's virtual visit.  While this provider is not your primary care provider (PCP), if your PCP is located in our provider database this encounter information will be shared with them immediately following your visit.   A [facility] MyChart account gives you access to today's visit and all your visits, tests, and labs performed at [facility]  click here if you don't have a [facility] MyChart account or go to [email or web address]  Consent: (Patient) Kathy Duncan provided verbal consent for this virtual visit at the beginning of the encounter.  Current Medications:  Current Outpatient Medications:    aspirin  EC 81 MG tablet, Take 1 tablet (81 mg total) by mouth daily. Swallow whole., Disp: 90 tablet, Rfl: 2   augmented betamethasone  dipropionate (DIPROLENE -AF) 0.05 % cream, APPLY TWICE A DAY TO THE AFFECTED SKIN, Disp: 50 g, Rfl: 0   cetirizine  (ZYRTEC  ALLERGY) 10 MG tablet, Take 1 tablet (10 mg total) by mouth daily for 14 days., Disp: 14 tablet, Rfl: 0   cholecalciferol (VITAMIN D3) 25 MCG (1000 UNIT) tablet, Take 1,000 Units by mouth daily., Disp: , Rfl:    diclofenac  Sodium (VOLTAREN ) 1 % GEL, Apply 2 g topically 4 (four) times daily as needed (Shoulder pain)., Disp: 50 g, Rfl: 1   diphenhydrAMINE  (BENADRYL ) 25 MG tablet, Take 1 tablet (25 mg total) by mouth every 8 (eight) hours as needed., Disp: 30 tablet, Rfl: 0   FARXIGA  10 MG TABS tablet, TAKE 1 TABLET BY MOUTH EVERY DAY, Disp: 90 tablet, Rfl: 1   ferrous sulfate  325 (65 FE) MG tablet, Take 1 tablet (325 mg total) by mouth daily with breakfast., Disp: 30 tablet, Rfl: 5   insulin  degludec (TRESIBA  FLEXTOUCH) 100 UNIT/ML FlexTouch Pen, Inject 25 Units into the skin daily., Disp: , Rfl:    Insulin  Pen Needle (BD PEN NEEDLE NANO 2ND GEN) 32G X 4 MM MISC, USE TO INJECT INSULIN  AS INSTRUCTED, Disp: 100 each, Rfl: 5   Lancets (ONETOUCH DELICA PLUS  LANCET33G) MISC, USE TO CHECK BLOOD SUGAR 3 TIMES A DAY, Disp: 300 each, Rfl: 2   losartan -hydrochlorothiazide  (HYZAAR) 50-12.5 MG tablet, TAKE 1 TABLET BY MOUTH EVERY DAY, Disp: 90 tablet, Rfl: 1   metFORMIN  (GLUCOPHAGE ) 500 MG tablet, Take 2 tablets (1,000 mg total) by mouth 2 (two) times daily with a meal., Disp: , Rfl:    nitroGLYCERIN  (NITROSTAT ) 0.4 MG SL tablet, Place 1 tablet (0.4 mg total) under the tongue every 5 (five) minutes as needed for chest pain., Disp: 25 tablet, Rfl: 3   ONETOUCH VERIO test strip, USE TO CHECK BLOOD SUGAR 3 TIMES A DAY, Disp: 300 strip, Rfl: 1   rosuvastatin  (CRESTOR ) 20 MG tablet, TAKE 1 TABLET BY MOUTH EVERY DAY, Disp: 90 tablet, Rfl: 1   sitaGLIPtin  (JANUVIA ) 100 MG tablet, Take 1 tablet (100 mg total) by mouth daily., Disp: 90 tablet, Rfl: 1   Medications ordered in this encounter:  No orders of the defined types were placed in this encounter.    *If you need refills on other medications prior to your next appointment, please contact your pharmacy*  Follow-Up: Call back or seek an in-person evaluation if the symptoms worsen or if the condition fails to improve as anticipated.  [facility] [phone]  Other Instructions  Please be seen at an in person urgent care today.    If you have been instructed to have an in-person evaluation today  at a local Urgent Care facility, please use the link below. It will take you to a list of all of our available [facility], including address, phone number and hours of operation. Please do not delay care.  [facility]  If you or a family member do not have a primary care provider, use the link below to schedule a visit and establish care. When you choose a [facility] primary care physician or advanced practice provider, you gain a long-term partner in health. Find a Primary Care Provider  Learn more about [facility]'s in-office and virtual care options: [facility] -  Get Care Now  "

## 2024-05-28 NOTE — Progress Notes (Signed)
 " Virtual Visit Consent   Kathy Duncan, you are scheduled for a virtual visit with a [facility] provider today. Just as with appointments in the office, your consent must be obtained to participate. Your consent will be active for this visit and any virtual visit you may have with one of our providers in the next 365 days. If you have a MyChart account, a copy of this consent can be sent to you electronically.  As this is a virtual visit, video technology does not allow for your provider to perform a traditional examination. This may limit your provider's ability to fully assess your condition. If your provider identifies any concerns that need to be evaluated in person or the need to arrange testing (such as labs, EKG, etc.), we will make arrangements to do so. Although advances in technology are sophisticated, we cannot ensure that it will always work on either your end or our end. If the connection with a video visit is poor, the visit may have to be switched to a telephone visit. With either a video or telephone visit, we are not always able to ensure that we have a secure connection.  By engaging in this virtual visit, you consent to the provision of healthcare and authorize for your insurance to be billed (if applicable) for the services provided during this visit. Depending on your insurance coverage, you may receive a charge related to this service.  I need to obtain your verbal consent now. Are you willing to proceed with your visit today? Zerina Hallinan has provided verbal consent on 05/28/2024 for a virtual visit (video or telephone). [name], NP  Date: 05/28/2024 11:17 AM   Virtual Visit via Video Note   I, [name], connected with  Kathy Duncan  ([id number], [birth year]? ) on 05/28/2024 at 11:00 AM EST by a video-enabled telemedicine application and verified that I am speaking with the correct person using two identifiers. Pt was unable to successfully turn on her  camera - appt was conducted by audio only.   Location: Patient: Virtual Visit Location Patient: Home Provider: Virtual Visit Location Provider: Home Office   I discussed the limitations of evaluation and management by telemedicine and the availability of in person appointments. The patient expressed understanding and agreed to proceed.    History of Present Illness: Kathy Duncan is a 69 y.o. who identifies as a female who was assigned female at birth, and is being seen today for boils.  Boil on L forearm, gets them all the time. Also has boil on thighs and one under left after, and one on stomach. Does give herself injections in thigh and stomach locations, does not inject self in axilla or forearm.   Has been getting recurrent boils since she was 69 years old. Uses boil ease and that usually cares of it. Also uses warm compresses.   One on left forearm is worrisome to her. It is big, with lots of surroudning swelling, and it will not bust open and drain like they usually do.   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) 06/13/2022   Posterior vitreous detachment of both eyes 02/02/2022   Pseudophakia of both eyes 02/02/2022   Essential hypertension 11/01/2020   Coronary artery disease involving native coronary artery of native heart without angina pectoris 06/17/2020   Aortic atherosclerosis 06/17/2020  Retinal hemorrhage of left eye 05/04/2020   Atypical ductal hyperplasia of breast 12/15/2010   Hyperlipidemia associated with type 2 diabetes mellitus (HCC) 12/28/2006   CARPAL TUNNEL SYNDROME, BILATERAL 12/28/2006   OBESITY, NOS 07/12/2006    Allergies: Allergies[1] Medications: Current Medications[2]  Observations/Objective: Patient is in no acute distress.  No labored breathing.  Speech is clear and coherent with logical  content.  Patient is alert and oriented at baseline.    Assessment and Plan: 1. Abscess of multiple sites (Primary)  Needs in person care, I directed her to urgent care  Follow Up Instructions: I discussed the assessment and treatment plan with the patient. The patient was provided an opportunity to ask questions and all were answered. The patient agreed with the plan and demonstrated an understanding of the instructions.  A copy of instructions were sent to the patient via MyChart unless otherwise noted below.   The patient was advised to call back or seek an in-person evaluation if the symptoms worsen or if the condition fails to improve as anticipated.    [name], NP    [1]  Allergies Allergen Reactions   Ace Inhibitors Cough  [2]  Current Outpatient Medications:    aspirin  EC 81 MG tablet, Take 1 tablet (81 mg total) by mouth daily. Swallow whole., Disp: 90 tablet, Rfl: 2   augmented betamethasone  dipropionate (DIPROLENE -AF) 0.05 % cream, APPLY TWICE A DAY TO THE AFFECTED SKIN, Disp: 50 g, Rfl: 0   cetirizine  (ZYRTEC  ALLERGY) 10 MG tablet, Take 1 tablet (10 mg total) by mouth daily for 14 days., Disp: 14 tablet, Rfl: 0   cholecalciferol (VITAMIN D3) 25 MCG (1000 UNIT) tablet, Take 1,000 Units by mouth daily., Disp: , Rfl:    diclofenac  Sodium (VOLTAREN ) 1 % GEL, Apply 2 g topically 4 (four) times daily as needed (Shoulder pain)., Disp: 50 g, Rfl: 1   diphenhydrAMINE  (BENADRYL ) 25 MG tablet, Take 1 tablet (25 mg total) by mouth every 8 (eight) hours as needed., Disp: 30 tablet, Rfl: 0   FARXIGA  10 MG TABS tablet, TAKE 1 TABLET BY MOUTH EVERY DAY, Disp: 90 tablet, Rfl: 1   ferrous sulfate  325 (65 FE) MG tablet, Take 1 tablet (325 mg total) by mouth daily with breakfast., Disp: 30 tablet, Rfl: 5   insulin  degludec (TRESIBA  FLEXTOUCH) 100 UNIT/ML FlexTouch Pen, Inject 25 Units into the skin daily., Disp: , Rfl:    Insulin  Pen Needle (BD PEN NEEDLE NANO 2ND GEN) 32G X 4 MM  MISC, USE TO INJECT INSULIN  AS INSTRUCTED, Disp: 100 each, Rfl: 5   Lancets (ONETOUCH DELICA PLUS LANCET33G) MISC, USE TO CHECK BLOOD SUGAR 3 TIMES A DAY, Disp: 300 each, Rfl: 2   losartan -hydrochlorothiazide  (HYZAAR) 50-12.5 MG tablet, TAKE 1 TABLET BY MOUTH EVERY DAY, Disp: 90 tablet, Rfl: 1   metFORMIN  (GLUCOPHAGE ) 500 MG tablet, Take 2 tablets (1,000 mg total) by mouth 2 (two) times daily with a meal., Disp: , Rfl:    nitroGLYCERIN  (NITROSTAT ) 0.4 MG SL tablet, Place 1 tablet (0.4 mg total) under the tongue every 5 (five) minutes as needed for chest pain., Disp: 25 tablet, Rfl: 3   ONETOUCH VERIO test strip, USE TO CHECK BLOOD SUGAR 3 TIMES A DAY, Disp: 300 strip, Rfl: 1   rosuvastatin  (CRESTOR ) 20 MG tablet, TAKE 1 TABLET BY MOUTH EVERY DAY, Disp: 90 tablet, Rfl: 1   sitaGLIPtin  (JANUVIA ) 100 MG tablet, Take 1 tablet (100 mg total) by mouth daily., Disp: 90 tablet, Rfl: 1  "

## 2024-05-28 NOTE — ED Triage Notes (Signed)
 Pt reports long hx of abscesses; states usually applies topical Boil-Eez and applies warm compresses, but this time it's not working; started with left forearm abscess onset 7 days ago with some drainage. Denies any fevers.

## 2024-05-28 NOTE — Discharge Instructions (Addendum)
 Use warm compresses to affected area 20 mins at a time a couple times a day  Be sure to complete antibiotics.

## 2024-05-31 LAB — AEROBIC CULTURE W GRAM STAIN (SUPERFICIAL SPECIMEN): Gram Stain: NONE SEEN

## 2024-06-02 ENCOUNTER — Ambulatory Visit (HOSPITAL_COMMUNITY): Payer: Self-pay
